# Patient Record
Sex: Male | Born: 1955 | Race: White | Hispanic: No | Marital: Married | Smoking: Current every day smoker
Health system: Southern US, Community
[De-identification: ages and names within clinical notes are randomized; demographics above are authoritative.]

## PROBLEM LIST (undated history)

## (undated) ENCOUNTER — Ambulatory Visit

## (undated) ENCOUNTER — Ambulatory Visit: Attending: Pharmacist | Primary: Pharmacist

## (undated) ENCOUNTER — Encounter

## (undated) ENCOUNTER — Telehealth

## (undated) ENCOUNTER — Ambulatory Visit: Attending: Radiation Oncology | Primary: Radiation Oncology

## (undated) ENCOUNTER — Encounter: Attending: "Endocrinology | Primary: "Endocrinology

## (undated) ENCOUNTER — Telehealth: Attending: Otolaryngology | Primary: Otolaryngology

## (undated) ENCOUNTER — Encounter: Attending: Certified Registered" | Primary: Certified Registered"

## (undated) ENCOUNTER — Encounter: Attending: Family | Primary: Family

## (undated) ENCOUNTER — Encounter: Attending: Radiation Oncology | Primary: Radiation Oncology

## (undated) ENCOUNTER — Encounter: Attending: Family Medicine | Primary: Family Medicine

## (undated) ENCOUNTER — Encounter: Attending: Urology | Primary: Urology

## (undated) ENCOUNTER — Ambulatory Visit
Attending: Student in an Organized Health Care Education/Training Program | Primary: Student in an Organized Health Care Education/Training Program

## (undated) ENCOUNTER — Telehealth: Attending: Family | Primary: Family

## (undated) ENCOUNTER — Encounter
Attending: Student in an Organized Health Care Education/Training Program | Primary: Student in an Organized Health Care Education/Training Program

## (undated) ENCOUNTER — Ambulatory Visit: Payer: PRIVATE HEALTH INSURANCE

## (undated) ENCOUNTER — Telehealth: Attending: Family Medicine | Primary: Family Medicine

## (undated) ENCOUNTER — Ambulatory Visit: Payer: MEDICARE

## (undated) ENCOUNTER — Ambulatory Visit: Payer: PRIVATE HEALTH INSURANCE | Attending: Registered" | Primary: Registered"

## (undated) ENCOUNTER — Encounter: Attending: Gastroenterology | Primary: Gastroenterology

## (undated) ENCOUNTER — Ambulatory Visit: Payer: MEDICARE | Attending: "Endocrinology | Primary: "Endocrinology

## (undated) ENCOUNTER — Telehealth: Attending: Clinical | Primary: Clinical

## (undated) ENCOUNTER — Encounter: Payer: PRIVATE HEALTH INSURANCE | Attending: Family | Primary: Family

## (undated) ENCOUNTER — Ambulatory Visit: Attending: Nurse Practitioner | Primary: Nurse Practitioner

## (undated) ENCOUNTER — Encounter
Payer: PRIVATE HEALTH INSURANCE | Attending: Student in an Organized Health Care Education/Training Program | Primary: Student in an Organized Health Care Education/Training Program

## (undated) ENCOUNTER — Ambulatory Visit: Payer: PRIVATE HEALTH INSURANCE | Attending: Gastroenterology | Primary: Gastroenterology

## (undated) ENCOUNTER — Ambulatory Visit: Attending: "Endocrinology | Primary: "Endocrinology

## (undated) ENCOUNTER — Telehealth: Attending: Urology | Primary: Urology

## (undated) ENCOUNTER — Telehealth: Attending: Radiation Oncology | Primary: Radiation Oncology

## (undated) ENCOUNTER — Ambulatory Visit: Payer: PRIVATE HEALTH INSURANCE | Attending: Family | Primary: Family

## (undated) ENCOUNTER — Telehealth: Attending: Internal Medicine | Primary: Internal Medicine

## (undated) ENCOUNTER — Ambulatory Visit: Payer: MEDICARE | Attending: Family | Primary: Family

## (undated) ENCOUNTER — Encounter: Payer: PRIVATE HEALTH INSURANCE | Attending: Radiation Oncology | Primary: Radiation Oncology

## (undated) ENCOUNTER — Encounter: Attending: Internal Medicine | Primary: Internal Medicine

## (undated) ENCOUNTER — Encounter: Attending: Speech-Language Pathologist | Primary: Speech-Language Pathologist

## (undated) ENCOUNTER — Ambulatory Visit: Payer: PRIVATE HEALTH INSURANCE | Attending: Neurology | Primary: Neurology

## (undated) ENCOUNTER — Encounter: Payer: PRIVATE HEALTH INSURANCE | Attending: "Endocrinology | Primary: "Endocrinology

## (undated) ENCOUNTER — Non-Acute Institutional Stay: Payer: PRIVATE HEALTH INSURANCE

## (undated) ENCOUNTER — Ambulatory Visit: Payer: PRIVATE HEALTH INSURANCE | Attending: Internal Medicine | Primary: Internal Medicine

## (undated) DIAGNOSIS — J449 Chronic obstructive pulmonary disease, unspecified: Secondary | ICD-10-CM

## (undated) DIAGNOSIS — G8929 Other chronic pain: Secondary | ICD-10-CM

## (undated) HISTORY — PX: NECK SURGERY: SHX720

---
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
## 2003-01-28 ENCOUNTER — Inpatient Hospital Stay (HOSPITAL_COMMUNITY): Admission: EM | Admit: 2003-01-28 | Discharge: 2003-02-01 | Payer: Self-pay | Admitting: Psychiatry

## 2003-04-27 ENCOUNTER — Emergency Department (HOSPITAL_COMMUNITY): Admission: EM | Admit: 2003-04-27 | Discharge: 2003-04-28 | Payer: Self-pay | Admitting: Emergency Medicine

## 2003-09-25 ENCOUNTER — Other Ambulatory Visit: Payer: Self-pay

## 2004-11-22 ENCOUNTER — Ambulatory Visit: Payer: Self-pay | Admitting: Family Medicine

## 2005-01-19 ENCOUNTER — Ambulatory Visit: Payer: Self-pay | Admitting: Psychiatry

## 2005-02-13 ENCOUNTER — Inpatient Hospital Stay: Payer: Self-pay | Admitting: Internal Medicine

## 2005-02-13 ENCOUNTER — Other Ambulatory Visit: Payer: Self-pay

## 2005-06-03 ENCOUNTER — Other Ambulatory Visit: Payer: Self-pay

## 2005-06-09 ENCOUNTER — Ambulatory Visit: Payer: Self-pay | Admitting: Unknown Physician Specialty

## 2006-10-05 ENCOUNTER — Other Ambulatory Visit: Payer: Self-pay

## 2006-10-05 ENCOUNTER — Emergency Department: Payer: Self-pay | Admitting: Emergency Medicine

## 2007-04-28 ENCOUNTER — Ambulatory Visit: Payer: Self-pay | Admitting: Internal Medicine

## 2007-07-07 ENCOUNTER — Ambulatory Visit: Payer: Self-pay | Admitting: Emergency Medicine

## 2008-07-08 ENCOUNTER — Ambulatory Visit: Payer: Self-pay | Admitting: Gastroenterology

## 2008-09-02 ENCOUNTER — Emergency Department: Payer: Self-pay | Admitting: Emergency Medicine

## 2008-11-15 ENCOUNTER — Ambulatory Visit: Payer: Self-pay | Admitting: Internal Medicine

## 2009-10-06 ENCOUNTER — Ambulatory Visit: Payer: Self-pay | Admitting: Family Medicine

## 2009-12-22 ENCOUNTER — Ambulatory Visit: Payer: Self-pay | Admitting: Internal Medicine

## 2014-01-15 ENCOUNTER — Ambulatory Visit: Payer: Self-pay | Admitting: Family Medicine

## 2014-02-25 ENCOUNTER — Ambulatory Visit: Payer: Self-pay | Admitting: Family Medicine

## 2015-02-04 ENCOUNTER — Ambulatory Visit
Admission: EM | Admit: 2015-02-04 | Discharge: 2015-02-04 | Disposition: A | Discharge status: Home / Self Care | Payer: BLUE CROSS/BLUE SHIELD | Attending: Internal Medicine | Admitting: Internal Medicine

## 2015-02-04 DIAGNOSIS — B0050 Herpesviral ocular disease, unspecified: Secondary | ICD-10-CM | POA: Diagnosis not present

## 2015-02-04 HISTORY — DX: Other chronic pain: G89.29

## 2015-02-04 HISTORY — DX: Chronic obstructive pulmonary disease, unspecified: J44.9

## 2015-02-04 MED ORDER — GANCICLOVIR 0.15 % OP GEL: 1.0000 [drp] | Freq: Every day | OPHTHALMIC | Status: AC

## 2015-02-04 NOTE — ED Provider Notes (Signed)
CSN: [id number]     Arrival date & time 02/04/15  1631 History   First MD Initiated Contact with Patient 02/04/15 1739     Chief Complaint  Patient presents with  . Eye Pain   (Consider location/radiation/quality/duration/timing/severity/associated sxs/prior Treatment) HPI   This a 59 year old gentleman who presents with the sudden onset of right eye pain stating he felt like something was in his eye when he awoke this morning. He does not remember having any foreign object in his eye but is been bothering him all day long. He does not wear contacts. He has been mildly photosensitive. He states that it hurts worse when he closes his eyes. His eye has been red today.  Past Medical History  Diagnosis Date  . COPD (chronic obstructive pulmonary disease)   . Chronic pain    Past Surgical History  Procedure Laterality Date  . Neck surgery      X 2   No family history on file. History  Substance Use Topics  . Smoking status: Current Every Day Smoker -- 1.00 packs/day for 40 years    Types: Cigarettes  . Smokeless tobacco: Not on file  . Alcohol Use: No    Review of Systems  Eyes: Positive for photophobia, pain and redness.  All other systems reviewed and are negative.   Allergies  Penicillins  Home Medications   Prior to Admission medications   Medication Sig Start Date End Date Taking? Authorizing Provider  albuterol (PROVENTIL HFA;VENTOLIN HFA) 108 (90 BASE) MCG/ACT inhaler Inhale 2 puffs into the lungs every 6 (six) hours as needed for wheezing or shortness of breath.   Yes Historical Provider, MD  buprenorphine-naloxone (SUBOXONE) 2-0.5 MG SUBL SL tablet Place 1 tablet under the tongue daily.   Yes Historical Provider, MD  cholecalciferol (VITAMIN D) 1000 UNITS tablet Take 1,000 Units by mouth daily.   Yes Historical Provider, MD  ergocalciferol (VITAMIN D2) 50000 UNITS capsule Take 50,000 Units by mouth once a week.   Yes Historical Provider, MD  Fluticasone-Salmeterol  (ADVAIR) 250-50 MCG/DOSE AEPB Inhale 1 puff into the lungs 2 (two) times daily.   Yes Historical Provider, MD  Ganciclovir (ZIRGAN) 0.15 % GEL Place 1 drop into the right eye 5 (five) times daily. 02/04/15   Chrissie NoaWilliam P Altus Zaino, PA-C   BP 126/64 mmHg  Pulse 61  Temp(Src) 97.4 F (36.3 C)  Resp 18  Ht 5\' 6"  (1.676 m)  Wt 120 lb (54.432 kg)  BMI 19.38 kg/m2  SpO2 98% Physical Exam  Constitutional: He is oriented to person, place, and time. He appears well-developed and well-nourished.  HENT:  Head: Normocephalic and atraumatic.  Eyes:  Examination of his eyes as visual acuity on the right and 20/25 left 20/15 in both 20/15 with correction using a Snelling chart. Pupil is reactive round. Iris appears normal. The eye is red on the conjunctiva. There is no discharge. The eyelid was everted and no foreign object was found. After permission from the patient the eye was anesthetized using tetracaine and stained with fluorisen. Semination under black clamp shows a 4 mm diffuse patch over the visual surface of the right eye that appears branching. It has a dendritic appearance.  Neurological: He is alert and oriented to person, place, and time. He has normal reflexes.  Skin: Skin is warm and dry.  Psychiatric: He has a normal mood and affect. His behavior is normal. Judgment and thought content normal.    ED Course  Procedures (including critical care time)  Labs Review Labs Reviewed - No data to display  Imaging Review No results found.   MDM   1. Herpes simplex of eye     Discharge Medication List as of 02/04/2015  6:27 PM    START taking these medications   Details  Ganciclovir (ZIRGAN) 0.15 % GEL Place 1 drop into the right eye 5 (five) times daily., Starting 02/04/2015, Until Discontinued, Print       Plan: 1. Diagnosis reviewed with patient 2. rx as per orders; risks, benefits, potential side effects reviewed with patient 3. Recommend supportive treatment with ibuprofen 4. F/u  Dr. Melanie Crazier  Advised the patient of a conversation with Dr. Melanie Crazier the eye surgeon who recommended placing him on Zirgan and have him be seen in the office in a day or 2. The patient will call the office tomorrow to set up an appointment with Dr. Melanie Crazier. He will use ibuprofen for pain and apply cold compresses as necessary to his eye. I will also keep him out of work for 2 days until he seen by Dr. Melanie Crazier.  Lutricia Feil, PA-C 02/04/15 1848

## 2015-02-04 NOTE — ED Notes (Signed)
Patient complains of right eye pain. States that pain started when he woke up this morning. He states that he is having more pain when eye is closed. Eye is red. Patient states that he is not sure what is going on with the eye. Patient describes the pain as sharp and constant.

## 2015-11-10 ENCOUNTER — Ambulatory Visit
Admission: EM | Admit: 2015-11-10 | Discharge: 2015-11-10 | Discharge status: Absent without leave | Payer: BLUE CROSS/BLUE SHIELD

## 2017-07-21 ENCOUNTER — Ambulatory Visit
Admission: RE | Admit: 2017-07-21 | Discharge: 2017-07-21 | Disposition: A | Discharge status: Home / Self Care | Payer: BC Managed Care – PPO

## 2017-07-21 DIAGNOSIS — K7460 Unspecified cirrhosis of liver: Principal | ICD-10-CM

## 2017-07-21 DIAGNOSIS — R918 Other nonspecific abnormal finding of lung field: Secondary | ICD-10-CM

## 2017-07-27 ENCOUNTER — Ambulatory Visit
Admission: RE | Admit: 2017-07-27 | Discharge: 2017-07-27 | Disposition: A | Discharge status: Home / Self Care | Payer: BC Managed Care – PPO | Attending: Family | Admitting: Family

## 2017-07-27 DIAGNOSIS — R634 Abnormal weight loss: Secondary | ICD-10-CM

## 2017-07-27 DIAGNOSIS — K7460 Unspecified cirrhosis of liver: Principal | ICD-10-CM

## 2017-07-27 MED ORDER — LACTULOSE 20 GRAM/30 ML ORAL SOLUTION
Freq: Every day | ORAL | 6 refills | 0.00000 days | Status: CP
Start: 2017-07-27 — End: 2018-03-08

## 2017-08-02 ENCOUNTER — Ambulatory Visit
Admission: RE | Admit: 2017-08-02 | Discharge: 2017-08-02 | Disposition: A | Discharge status: Home / Self Care | Payer: BC Managed Care – PPO

## 2017-08-02 DIAGNOSIS — K7460 Unspecified cirrhosis of liver: Principal | ICD-10-CM

## 2017-08-02 DIAGNOSIS — R634 Abnormal weight loss: Secondary | ICD-10-CM

## 2017-08-18 ENCOUNTER — Encounter
Admit: 2017-08-18 | Discharge: 2017-08-18 | Payer: PRIVATE HEALTH INSURANCE | Attending: Certified Registered" | Primary: Certified Registered"

## 2017-08-18 ENCOUNTER — Encounter: Admit: 2017-08-18 | Discharge: 2017-08-18 | Payer: PRIVATE HEALTH INSURANCE

## 2017-08-18 DIAGNOSIS — K7460 Unspecified cirrhosis of liver: Principal | ICD-10-CM

## 2017-08-21 MED ORDER — ATORVASTATIN 40 MG TABLET
ORAL_TABLET | 1 refills | 0 days | Status: CP
Start: 2017-08-21 — End: 2017-11-20

## 2017-08-30 ENCOUNTER — Encounter
Admit: 2017-08-30 | Discharge: 2017-08-31 | Payer: PRIVATE HEALTH INSURANCE | Attending: "Endocrinology | Primary: "Endocrinology

## 2017-08-30 DIAGNOSIS — M818 Other osteoporosis without current pathological fracture: Principal | ICD-10-CM

## 2017-08-30 DIAGNOSIS — K219 Gastro-esophageal reflux disease without esophagitis: Secondary | ICD-10-CM

## 2017-09-28 ENCOUNTER — Encounter: Admit: 2017-09-28 | Discharge: 2017-09-28 | Payer: PRIVATE HEALTH INSURANCE | Attending: Urology | Primary: Urology

## 2017-09-28 DIAGNOSIS — R3989 Other symptoms and signs involving the genitourinary system: Secondary | ICD-10-CM

## 2017-09-28 DIAGNOSIS — N138 Other obstructive and reflux uropathy: Secondary | ICD-10-CM

## 2017-09-28 DIAGNOSIS — R972 Elevated prostate specific antigen [PSA]: Principal | ICD-10-CM

## 2017-09-28 DIAGNOSIS — R809 Proteinuria, unspecified: Secondary | ICD-10-CM

## 2017-09-28 DIAGNOSIS — N401 Enlarged prostate with lower urinary tract symptoms: Secondary | ICD-10-CM

## 2017-09-28 MED ORDER — TAMSULOSIN 0.4 MG CAPSULE
ORAL_CAPSULE | Freq: Every day | ORAL | 4 refills | 0.00000 days | Status: CP
Start: 2017-09-28 — End: 2018-03-06

## 2017-10-02 ENCOUNTER — Encounter: Admit: 2017-10-02 | Discharge: 2017-10-03 | Payer: PRIVATE HEALTH INSURANCE

## 2017-10-02 DIAGNOSIS — M818 Other osteoporosis without current pathological fracture: Principal | ICD-10-CM

## 2017-10-26 ENCOUNTER — Encounter: Admit: 2017-10-26 | Discharge: 2017-10-27 | Payer: PRIVATE HEALTH INSURANCE

## 2017-10-26 DIAGNOSIS — R972 Elevated prostate specific antigen [PSA]: Principal | ICD-10-CM

## 2017-10-30 ENCOUNTER — Encounter: Admit: 2017-10-30 | Discharge: 2017-10-31 | Payer: PRIVATE HEALTH INSURANCE | Attending: Family | Primary: Family

## 2017-10-30 DIAGNOSIS — K7460 Unspecified cirrhosis of liver: Secondary | ICD-10-CM

## 2017-10-30 DIAGNOSIS — D649 Anemia, unspecified: Secondary | ICD-10-CM

## 2017-10-30 DIAGNOSIS — K769 Liver disease, unspecified: Secondary | ICD-10-CM

## 2017-10-30 DIAGNOSIS — R001 Bradycardia, unspecified: Principal | ICD-10-CM

## 2017-11-09 ENCOUNTER — Ambulatory Visit: Admit: 2017-11-09 | Discharge: 2017-11-10 | Payer: PRIVATE HEALTH INSURANCE

## 2017-11-09 DIAGNOSIS — D649 Anemia, unspecified: Principal | ICD-10-CM

## 2017-11-17 ENCOUNTER — Ambulatory Visit: Admit: 2017-11-17 | Discharge: 2017-11-18 | Payer: PRIVATE HEALTH INSURANCE

## 2017-11-17 DIAGNOSIS — I25119 Atherosclerotic heart disease of native coronary artery with unspecified angina pectoris: Principal | ICD-10-CM

## 2017-11-17 DIAGNOSIS — R001 Bradycardia, unspecified: Secondary | ICD-10-CM

## 2017-11-20 ENCOUNTER — Encounter
Admit: 2017-11-20 | Discharge: 2017-11-20 | Payer: PRIVATE HEALTH INSURANCE | Attending: Family Medicine | Primary: Family Medicine

## 2017-11-20 DIAGNOSIS — J438 Other emphysema: Principal | ICD-10-CM

## 2017-11-20 DIAGNOSIS — D539 Nutritional anemia, unspecified: Secondary | ICD-10-CM

## 2017-11-20 DIAGNOSIS — J449 Chronic obstructive pulmonary disease, unspecified: Secondary | ICD-10-CM

## 2017-11-20 MED ORDER — ALBUTEROL SULFATE HFA 90 MCG/ACTUATION AEROSOL INHALER
RESPIRATORY_TRACT | 3 refills | 0 days | Status: CP | PRN
Start: 2017-11-20 — End: ?

## 2017-11-20 MED ORDER — LISINOPRIL 40 MG TABLET
ORAL_TABLET | Freq: Every day | ORAL | 3 refills | 0 days | Status: CP
Start: 2017-11-20 — End: 2018-01-03

## 2017-11-20 MED ORDER — ATORVASTATIN 40 MG TABLET
ORAL_TABLET | Freq: Every day | ORAL | 1 refills | 0 days | Status: CP
Start: 2017-11-20 — End: 2018-07-23

## 2017-11-20 MED ORDER — FLUTICASONE 250 MCG-SALMETEROL 50 MCG/DOSE BLISTR POWDR FOR INHALATION
Freq: Two times a day (BID) | RESPIRATORY_TRACT | 3 refills | 0 days | Status: CP
Start: 2017-11-20 — End: ?

## 2017-12-13 ENCOUNTER — Ambulatory Visit: Admit: 2017-12-13 | Discharge: 2017-12-13 | Payer: PRIVATE HEALTH INSURANCE

## 2017-12-13 ENCOUNTER — Institutional Professional Consult (permissible substitution): Admit: 2017-12-13 | Discharge: 2017-12-13 | Payer: PRIVATE HEALTH INSURANCE

## 2017-12-13 DIAGNOSIS — I25119 Atherosclerotic heart disease of native coronary artery with unspecified angina pectoris: Principal | ICD-10-CM

## 2017-12-13 DIAGNOSIS — R001 Bradycardia, unspecified: Secondary | ICD-10-CM

## 2017-12-21 ENCOUNTER — Encounter: Admit: 2017-12-21 | Discharge: 2017-12-22 | Payer: PRIVATE HEALTH INSURANCE

## 2017-12-21 DIAGNOSIS — J449 Chronic obstructive pulmonary disease, unspecified: Principal | ICD-10-CM

## 2018-01-03 ENCOUNTER — Encounter
Admit: 2018-01-03 | Discharge: 2018-01-04 | Payer: PRIVATE HEALTH INSURANCE | Attending: Family Medicine | Primary: Family Medicine

## 2018-01-03 DIAGNOSIS — D509 Iron deficiency anemia, unspecified: Principal | ICD-10-CM

## 2018-01-03 DIAGNOSIS — R5382 Chronic fatigue, unspecified: Secondary | ICD-10-CM

## 2018-01-29 ENCOUNTER — Encounter: Admit: 2018-01-29 | Discharge: 2018-01-29 | Payer: PRIVATE HEALTH INSURANCE

## 2018-01-29 ENCOUNTER — Ambulatory Visit: Admit: 2018-01-29 | Discharge: 2018-01-29 | Payer: PRIVATE HEALTH INSURANCE | Attending: Family | Primary: Family

## 2018-01-29 DIAGNOSIS — K7460 Unspecified cirrhosis of liver: Principal | ICD-10-CM

## 2018-01-29 DIAGNOSIS — K769 Liver disease, unspecified: Secondary | ICD-10-CM

## 2018-02-16 ENCOUNTER — Encounter: Admit: 2018-02-16 | Discharge: 2018-02-17 | Payer: PRIVATE HEALTH INSURANCE

## 2018-02-16 DIAGNOSIS — I25119 Atherosclerotic heart disease of native coronary artery with unspecified angina pectoris: Principal | ICD-10-CM

## 2018-02-16 DIAGNOSIS — R0609 Other forms of dyspnea: Secondary | ICD-10-CM

## 2018-03-06 MED ORDER — TAMSULOSIN 0.4 MG CAPSULE
ORAL_CAPSULE | Freq: Every day | ORAL | 2 refills | 0.00000 days | Status: CP
Start: 2018-03-06 — End: 2018-05-18

## 2018-03-08 MED ORDER — LACTULOSE 20 GRAM/30 ML ORAL SOLUTION
Freq: Every day | ORAL | 6 refills | 0 days | Status: CP
Start: 2018-03-08 — End: 2018-11-16

## 2018-05-18 ENCOUNTER — Encounter: Admit: 2018-05-18 | Discharge: 2018-05-18 | Payer: PRIVATE HEALTH INSURANCE | Attending: Urology | Primary: Urology

## 2018-05-18 DIAGNOSIS — R972 Elevated prostate specific antigen [PSA]: Secondary | ICD-10-CM

## 2018-05-18 DIAGNOSIS — N138 Other obstructive and reflux uropathy: Secondary | ICD-10-CM

## 2018-05-18 DIAGNOSIS — N401 Enlarged prostate with lower urinary tract symptoms: Principal | ICD-10-CM

## 2018-05-18 MED ORDER — TAMSULOSIN 0.4 MG CAPSULE
ORAL_CAPSULE | Freq: Every day | ORAL | 3 refills | 0.00000 days | Status: CP
Start: 2018-05-18 — End: 2019-04-08

## 2018-05-30 ENCOUNTER — Encounter: Admit: 2018-05-30 | Discharge: 2018-05-31 | Payer: PRIVATE HEALTH INSURANCE | Attending: Urology | Primary: Urology

## 2018-05-30 DIAGNOSIS — R972 Elevated prostate specific antigen [PSA]: Principal | ICD-10-CM

## 2018-05-30 MED ORDER — LEVOFLOXACIN 500 MG TABLET
ORAL_TABLET | Freq: Once | ORAL | 0 refills | 0 days | Status: CP
Start: 2018-05-30 — End: 2018-05-30

## 2018-06-14 DIAGNOSIS — C61 Malignant neoplasm of prostate: Principal | ICD-10-CM

## 2018-06-15 ENCOUNTER — Encounter: Admit: 2018-06-15 | Discharge: 2018-06-15 | Payer: PRIVATE HEALTH INSURANCE

## 2018-06-19 ENCOUNTER — Encounter: Admit: 2018-06-19 | Discharge: 2018-07-02 | Payer: PRIVATE HEALTH INSURANCE

## 2018-06-19 ENCOUNTER — Encounter: Admit: 2018-06-19 | Discharge: 2018-07-18 | Payer: PRIVATE HEALTH INSURANCE

## 2018-06-19 DIAGNOSIS — C61 Malignant neoplasm of prostate: Principal | ICD-10-CM

## 2018-06-25 ENCOUNTER — Encounter: Admit: 2018-06-25 | Discharge: 2018-06-25 | Payer: PRIVATE HEALTH INSURANCE | Attending: Urology | Primary: Urology

## 2018-06-25 ENCOUNTER — Encounter
Admit: 2018-06-25 | Discharge: 2018-06-25 | Payer: PRIVATE HEALTH INSURANCE | Attending: Radiation Oncology | Primary: Radiation Oncology

## 2018-06-25 DIAGNOSIS — C61 Malignant neoplasm of prostate: Principal | ICD-10-CM

## 2018-07-17 ENCOUNTER — Encounter
Admit: 2018-07-17 | Discharge: 2018-08-07 | Payer: PRIVATE HEALTH INSURANCE | Attending: Radiation Oncology | Primary: Radiation Oncology

## 2018-07-17 DIAGNOSIS — C61 Malignant neoplasm of prostate: Principal | ICD-10-CM

## 2018-07-17 MED ORDER — BICALUTAMIDE 50 MG TABLET
ORAL_TABLET | Freq: Every day | ORAL | 5 refills | 0 days | Status: CP
Start: 2018-07-17 — End: 2018-08-07

## 2018-07-30 ENCOUNTER — Encounter: Admit: 2018-07-30 | Discharge: 2018-07-30 | Payer: PRIVATE HEALTH INSURANCE | Attending: Family | Primary: Family

## 2018-07-30 ENCOUNTER — Ambulatory Visit: Admit: 2018-07-30 | Discharge: 2018-07-30 | Payer: PRIVATE HEALTH INSURANCE

## 2018-07-30 DIAGNOSIS — K7460 Unspecified cirrhosis of liver: Principal | ICD-10-CM

## 2018-08-07 ENCOUNTER — Encounter: Admit: 2018-08-07 | Discharge: 2018-08-08 | Payer: PRIVATE HEALTH INSURANCE | Attending: Family | Primary: Family

## 2018-08-07 DIAGNOSIS — R718 Other abnormality of red blood cells: Secondary | ICD-10-CM

## 2018-08-07 DIAGNOSIS — Z862 Personal history of diseases of the blood and blood-forming organs and certain disorders involving the immune mechanism: Secondary | ICD-10-CM

## 2018-08-07 DIAGNOSIS — Z720 Tobacco use: Secondary | ICD-10-CM

## 2018-08-07 DIAGNOSIS — K7460 Unspecified cirrhosis of liver: Principal | ICD-10-CM

## 2018-08-07 DIAGNOSIS — R634 Abnormal weight loss: Secondary | ICD-10-CM

## 2018-08-07 DIAGNOSIS — R918 Other nonspecific abnormal finding of lung field: Secondary | ICD-10-CM

## 2018-08-07 DIAGNOSIS — K769 Liver disease, unspecified: Secondary | ICD-10-CM

## 2018-08-10 ENCOUNTER — Encounter: Admit: 2018-08-10 | Discharge: 2018-08-11 | Payer: PRIVATE HEALTH INSURANCE

## 2018-08-10 DIAGNOSIS — M818 Other osteoporosis without current pathological fracture: Principal | ICD-10-CM

## 2018-08-13 ENCOUNTER — Ambulatory Visit: Admit: 2018-08-13 | Discharge: 2018-08-14 | Payer: PRIVATE HEALTH INSURANCE

## 2018-08-13 DIAGNOSIS — R918 Other nonspecific abnormal finding of lung field: Principal | ICD-10-CM

## 2018-08-13 DIAGNOSIS — Z129 Encounter for screening for malignant neoplasm, site unspecified: Secondary | ICD-10-CM

## 2018-08-28 MED ORDER — LEVOFLOXACIN 500 MG TABLET
ORAL_TABLET | Freq: Once | ORAL | 0 refills | 0 days | Status: CP
Start: 2018-08-28 — End: 2018-08-28

## 2018-09-04 ENCOUNTER — Ambulatory Visit: Admit: 2018-09-04 | Discharge: 2018-09-05 | Payer: PRIVATE HEALTH INSURANCE

## 2018-09-04 DIAGNOSIS — C61 Malignant neoplasm of prostate: Principal | ICD-10-CM

## 2018-09-10 ENCOUNTER — Encounter: Admit: 2018-09-10 | Discharge: 2018-10-06 | Payer: PRIVATE HEALTH INSURANCE

## 2018-09-10 ENCOUNTER — Encounter
Admit: 2018-09-10 | Discharge: 2018-10-06 | Payer: PRIVATE HEALTH INSURANCE | Attending: Radiation Oncology | Primary: Radiation Oncology

## 2018-09-10 ENCOUNTER — Ambulatory Visit: Admit: 2018-09-10 | Discharge: 2018-10-06 | Payer: PRIVATE HEALTH INSURANCE

## 2018-09-10 DIAGNOSIS — Z5111 Encounter for antineoplastic chemotherapy: Principal | ICD-10-CM

## 2018-09-11 DIAGNOSIS — C61 Malignant neoplasm of prostate: Principal | ICD-10-CM

## 2018-09-12 DIAGNOSIS — Z5111 Encounter for antineoplastic chemotherapy: Principal | ICD-10-CM

## 2018-09-14 ENCOUNTER — Encounter: Admit: 2018-09-14 | Discharge: 2018-09-15 | Payer: PRIVATE HEALTH INSURANCE

## 2018-09-14 DIAGNOSIS — I25119 Atherosclerotic heart disease of native coronary artery with unspecified angina pectoris: Principal | ICD-10-CM

## 2018-09-26 ENCOUNTER — Encounter
Admit: 2018-09-26 | Discharge: 2018-09-27 | Payer: PRIVATE HEALTH INSURANCE | Attending: "Endocrinology | Primary: "Endocrinology

## 2018-09-26 DIAGNOSIS — N200 Calculus of kidney: Secondary | ICD-10-CM

## 2018-09-26 DIAGNOSIS — Z720 Tobacco use: Secondary | ICD-10-CM

## 2018-09-26 DIAGNOSIS — M818 Other osteoporosis without current pathological fracture: Principal | ICD-10-CM

## 2018-10-08 ENCOUNTER — Encounter
Admit: 2018-10-08 | Discharge: 2018-11-06 | Payer: PRIVATE HEALTH INSURANCE | Attending: Radiation Oncology | Primary: Radiation Oncology

## 2018-10-08 ENCOUNTER — Non-Acute Institutional Stay: Admit: 2018-10-08 | Discharge: 2018-11-06 | Payer: PRIVATE HEALTH INSURANCE

## 2018-10-08 ENCOUNTER — Encounter: Admit: 2018-10-08 | Discharge: 2018-11-06 | Payer: PRIVATE HEALTH INSURANCE

## 2018-10-08 ENCOUNTER — Ambulatory Visit
Admit: 2018-10-08 | Discharge: 2018-11-06 | Payer: PRIVATE HEALTH INSURANCE | Attending: Radiation Oncology | Primary: Radiation Oncology

## 2018-10-08 ENCOUNTER — Ambulatory Visit: Admit: 2018-10-08 | Discharge: 2018-11-06 | Payer: PRIVATE HEALTH INSURANCE

## 2018-10-08 DIAGNOSIS — Z5111 Encounter for antineoplastic chemotherapy: Principal | ICD-10-CM

## 2018-10-08 DIAGNOSIS — K7460 Unspecified cirrhosis of liver: Principal | ICD-10-CM

## 2018-10-08 DIAGNOSIS — K769 Liver disease, unspecified: Principal | ICD-10-CM

## 2018-10-08 DIAGNOSIS — C61 Malignant neoplasm of prostate: Principal | ICD-10-CM

## 2018-10-15 DIAGNOSIS — K769 Liver disease, unspecified: Principal | ICD-10-CM

## 2018-10-15 DIAGNOSIS — C61 Malignant neoplasm of prostate: Principal | ICD-10-CM

## 2018-10-15 DIAGNOSIS — K7460 Unspecified cirrhosis of liver: Principal | ICD-10-CM

## 2018-10-15 DIAGNOSIS — Z5111 Encounter for antineoplastic chemotherapy: Principal | ICD-10-CM

## 2018-10-16 DIAGNOSIS — C61 Malignant neoplasm of prostate: Principal | ICD-10-CM

## 2018-10-22 DIAGNOSIS — C61 Malignant neoplasm of prostate: Principal | ICD-10-CM

## 2018-10-22 DIAGNOSIS — Z5111 Encounter for antineoplastic chemotherapy: Principal | ICD-10-CM

## 2018-10-22 DIAGNOSIS — K769 Liver disease, unspecified: Principal | ICD-10-CM

## 2018-10-22 DIAGNOSIS — K7460 Unspecified cirrhosis of liver: Principal | ICD-10-CM

## 2018-10-25 ENCOUNTER — Ambulatory Visit: Admit: 2018-10-25 | Discharge: 2018-10-26 | Payer: PRIVATE HEALTH INSURANCE

## 2018-10-25 DIAGNOSIS — M818 Other osteoporosis without current pathological fracture: Principal | ICD-10-CM

## 2018-10-25 DIAGNOSIS — M810 Age-related osteoporosis without current pathological fracture: Principal | ICD-10-CM

## 2018-10-29 DIAGNOSIS — K7460 Unspecified cirrhosis of liver: Principal | ICD-10-CM

## 2018-10-29 DIAGNOSIS — Z5111 Encounter for antineoplastic chemotherapy: Principal | ICD-10-CM

## 2018-10-29 DIAGNOSIS — K769 Liver disease, unspecified: Principal | ICD-10-CM

## 2018-10-29 DIAGNOSIS — C61 Malignant neoplasm of prostate: Principal | ICD-10-CM

## 2018-10-30 ENCOUNTER — Encounter: Admit: 2018-10-30 | Discharge: 2018-10-31 | Payer: PRIVATE HEALTH INSURANCE

## 2018-10-30 DIAGNOSIS — R51 Headache: Principal | ICD-10-CM

## 2018-10-30 DIAGNOSIS — Z01818 Encounter for other preprocedural examination: Principal | ICD-10-CM

## 2018-10-30 DIAGNOSIS — M810 Age-related osteoporosis without current pathological fracture: Principal | ICD-10-CM

## 2018-10-30 DIAGNOSIS — K219 Gastro-esophageal reflux disease without esophagitis: Principal | ICD-10-CM

## 2018-10-30 DIAGNOSIS — J438 Other emphysema: Principal | ICD-10-CM

## 2018-10-30 DIAGNOSIS — C4490 Unspecified malignant neoplasm of skin, unspecified: Principal | ICD-10-CM

## 2018-10-30 DIAGNOSIS — F1911 Other psychoactive substance abuse, in remission: Principal | ICD-10-CM

## 2018-10-30 DIAGNOSIS — Z720 Tobacco use: Principal | ICD-10-CM

## 2018-10-30 DIAGNOSIS — N200 Calculus of kidney: Principal | ICD-10-CM

## 2018-10-30 DIAGNOSIS — C61 Malignant neoplasm of prostate: Principal | ICD-10-CM

## 2018-10-30 DIAGNOSIS — E785 Hyperlipidemia, unspecified: Principal | ICD-10-CM

## 2018-10-30 DIAGNOSIS — B159 Hepatitis A without hepatic coma: Principal | ICD-10-CM

## 2018-10-30 DIAGNOSIS — N400 Enlarged prostate without lower urinary tract symptoms: Principal | ICD-10-CM

## 2018-10-30 DIAGNOSIS — R911 Solitary pulmonary nodule: Principal | ICD-10-CM

## 2018-10-30 DIAGNOSIS — J45909 Unspecified asthma, uncomplicated: Principal | ICD-10-CM

## 2018-10-30 DIAGNOSIS — K635 Polyp of colon: Principal | ICD-10-CM

## 2018-10-30 DIAGNOSIS — G8929 Other chronic pain: Principal | ICD-10-CM

## 2018-10-30 DIAGNOSIS — B1920 Unspecified viral hepatitis C without hepatic coma: Principal | ICD-10-CM

## 2018-11-05 DIAGNOSIS — K7460 Unspecified cirrhosis of liver: Principal | ICD-10-CM

## 2018-11-05 DIAGNOSIS — Z5111 Encounter for antineoplastic chemotherapy: Principal | ICD-10-CM

## 2018-11-05 DIAGNOSIS — C61 Malignant neoplasm of prostate: Principal | ICD-10-CM

## 2018-11-05 DIAGNOSIS — K769 Liver disease, unspecified: Principal | ICD-10-CM

## 2018-11-05 MED ORDER — ATORVASTATIN 40 MG TABLET
ORAL_TABLET | 0 refills | 0 days | Status: CP
Start: 2018-11-05 — End: 2019-02-04

## 2018-11-16 MED ORDER — LACTULOSE 20 GRAM/30 ML ORAL SOLUTION
Freq: Every day | ORAL | 6 refills | 0 days | Status: CP
Start: 2018-11-16 — End: 2019-03-05

## 2018-12-04 ENCOUNTER — Encounter: Admit: 2018-12-04 | Discharge: 2019-01-02 | Payer: PRIVATE HEALTH INSURANCE

## 2018-12-04 ENCOUNTER — Ambulatory Visit: Admit: 2018-12-04 | Discharge: 2019-01-02 | Payer: PRIVATE HEALTH INSURANCE

## 2018-12-04 ENCOUNTER — Encounter: Admit: 2018-12-04 | Discharge: 2018-12-17 | Payer: PRIVATE HEALTH INSURANCE

## 2018-12-04 DIAGNOSIS — I25119 Atherosclerotic heart disease of native coronary artery with unspecified angina pectoris: Principal | ICD-10-CM

## 2018-12-11 ENCOUNTER — Encounter
Admit: 2018-12-11 | Discharge: 2019-01-06 | Payer: PRIVATE HEALTH INSURANCE | Attending: Radiation Oncology | Primary: Radiation Oncology

## 2018-12-11 ENCOUNTER — Encounter: Admit: 2018-12-11 | Discharge: 2019-01-06 | Payer: PRIVATE HEALTH INSURANCE

## 2018-12-11 DIAGNOSIS — C61 Malignant neoplasm of prostate: Principal | ICD-10-CM

## 2018-12-11 DIAGNOSIS — Z5111 Encounter for antineoplastic chemotherapy: Principal | ICD-10-CM

## 2019-01-16 ENCOUNTER — Encounter
Admit: 2019-01-16 | Discharge: 2019-02-05 | Payer: PRIVATE HEALTH INSURANCE | Attending: Radiation Oncology | Primary: Radiation Oncology

## 2019-01-16 DIAGNOSIS — C61 Malignant neoplasm of prostate: Principal | ICD-10-CM

## 2019-02-04 MED ORDER — ATORVASTATIN 40 MG TABLET
ORAL_TABLET | 0 refills | 0 days | Status: CP
Start: 2019-02-04 — End: ?

## 2019-02-25 ENCOUNTER — Ambulatory Visit: Admit: 2019-02-25 | Discharge: 2019-02-25 | Payer: PRIVATE HEALTH INSURANCE

## 2019-02-25 ENCOUNTER — Encounter: Admit: 2019-02-25 | Discharge: 2019-02-25 | Payer: PRIVATE HEALTH INSURANCE | Attending: Family | Primary: Family

## 2019-02-25 DIAGNOSIS — R1312 Dysphagia, oropharyngeal phase: Secondary | ICD-10-CM

## 2019-02-25 DIAGNOSIS — K769 Liver disease, unspecified: Principal | ICD-10-CM

## 2019-02-25 DIAGNOSIS — Z7689 Persons encountering health services in other specified circumstances: Secondary | ICD-10-CM

## 2019-02-25 DIAGNOSIS — K7460 Unspecified cirrhosis of liver: Secondary | ICD-10-CM

## 2019-02-25 DIAGNOSIS — R5382 Chronic fatigue, unspecified: Principal | ICD-10-CM

## 2019-02-25 DIAGNOSIS — Z1321 Encounter for screening for nutritional disorder: Secondary | ICD-10-CM

## 2019-02-25 DIAGNOSIS — Z720 Tobacco use: Secondary | ICD-10-CM

## 2019-02-25 DIAGNOSIS — G9340 Encephalopathy, unspecified: Secondary | ICD-10-CM

## 2019-02-25 DIAGNOSIS — J392 Other diseases of pharynx: Secondary | ICD-10-CM

## 2019-02-25 MED ORDER — RIFAXIMIN 550 MG TABLET
ORAL_TABLET | Freq: Two times a day (BID) | ORAL | 6 refills | 30.00000 days | Status: CP
Start: 2019-02-25 — End: ?
  Filled 2019-02-27: qty 60, 30d supply, fill #0

## 2019-02-25 NOTE — Unmapped (Signed)
[facility] Ph [phone] Fax [phone]    Woodfin Ganja, M.D.  Assistant Professor of Medicine  [facility]  [place] [facility] [place] [facility] [place]    [phone]    PCP:  Roxan Diesel, MD     Reason for Office Follow-up:  Follow-up for cirrhosis care. + decompensated cirrhosis based on +HE. He has demonstrated low MELD score. Patient s/p treatment and cure of HCV 09/24/14.  Newly diagnosed prostate cancer. Finished radiation txment March 2020 and continues with Lupron.     Presentation of Current Illness:    [name] is a 63 y.o. pleasant Caucasian gentleman who presents today for continued cirrhosis. 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 He successfully completed Harvoni x 12 weeks of treatment on 09/24/2014.  HCV RNA level at SVR was not-detected indicating evidence of him being cured. He was treatment naive with genotype 1a. History of recreational drug use (cocaine IV, intranasal and crack), three tattoos with one being homemade, and incarcerated once. Fibroscan 20 kPa consistent with F4 (cirrhosis).     Interim History:  He presents with this wife today who has been present during all his office visits. 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 He was started on trial of Lactulose 30 ml daily with noted improvement. Today he mentions if he does not take his dose of lactulose he will experience confusion. He will not take his dose of lactulose if he is on call due to the effects of the medication.  Previously he had experienced two occurrences of becoming very confused while driving and shakiness which has not seemed to occur recently (tremor like). Denies altered sleep pattern.    Noted two pound weight gain since last clinic visit on 07/2018. He has two more Lupron injections until treatment is completed. He completed radiation txment for prostate cancer in March. He was under the care of Dr. Assunta Gambles. Currently under the care of Dr. Nicanor Alcon, urology oncology. Gleason 4+4, clinical stage [id number] prostate cancer diagnosed in 2/12 of his biopsy cores. His CAPRA score is 4, consistent with intermediate-risk disease.+ history of macular degeneration and on AREDS. + history of skin cancer.     For the past six months he has been experiencing dysphagia involving both solid foods as well as liquids. Feeling of food bolus getting hung to oropharyngeal region. The occurs of choking has caused him to regurgitate twice. This can occur with solid foods as well as liquids. Denies pain with swallowing. Long standing history of tobacco use.     He had a colonoscopy 10/20/15 showing 1 small adenoma- 5 yr follow-up. EGD 08/2017 essentially unremarkable. Denies any jaundice, pruritis, SOB, CP, increased abdominal girth, LE edema, melena, BRBPR, confusion, nausea, vomiting, or diarrhea. Mild depression with recent medical history changes.  Finding of two 3 mm pulmonary nodules on CT scan lung for lung cancer screening (07/11/2016) in the setting of history of tobacco abuse. CT scan of chest performed 07/21/2017 stable unchanged subcentimeter pulmonary nodules. Annual surveillance recommended. CT scan of chest 08/13/2018 - No lung nodules. Surveillance one year. Complaint of chronic fatigue.     Echocardiogram 11/17/2017: Performed for indication of bradycardia.     Interpretation Summary     ?? Normal left ventricular systolic function, ejection fraction 60 to 65%  ?? Dilated left atrium - mild  ?? Normal right ventricular systolic function  ?? Tricuspid regurgitation - mild  ??  Mildly elevated right atrial pressure     External ECG-48 to 21 day findings:  Conclusion: ??1 run of non-sustained Ventricular Tachycardia occurred lasting 6 beats with a max rate of 112 bpm (avg 93 bpm). ??37 Supraventricular Tachycardia runs occurred, most consistent with paroxysmal atrial tachycardia; the run with the fastest interval lasting 13 beats with a max rate of 207 bpm, the longest lasting 8 beats with an avg rate of 104 bpm.    NM Myocardial perfusion spect multiple: 11/2018:   Normal myocardial perfusion study. No evidence for significant ischemia or scar is noted. Post stress: Global systolic function is hyperdynamic. The ejection fraction was greater than 65%. Coronary calcifications are noted. Mild bibasilar dependent atelectasis    Cirrhosis Care:  -- Varices screen: normal platelets and spleen size with Fibroscan < 25 kPa. No history of EGD.  -- HCC screen: MRI of abdomen 02/25/2019:   Stable LR-2 lesions, as above. No new hepatic lesions are identified. Spleen unremarkable.     -- Immunity hepatitis B.   -- Immunity hepatitis A: first vaccination given today.   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     -- Fibroscan 06/16/2014: 20.0 kPa/F4  --Chronic hepatitis C, genotype 1a  Pre-treatment HCV RNA:  14474 IU/mL  HCV RNA 08/23/2014 - TW #6/12 - non-detectable  HCV RNA 09/24/2014 - EOT- non-detectable  HCV RNA SVR 01/01/2015 - -not detected  HCV RNA 01/26/2017 - not detected    ROS:  All ten systems reviewed and negative except in HPI.     Allergies:   Allergies   Allergen Reactions   ??? Bicalutamide Rash   ??? Penicillins Rash     Medications:  Current Outpatient Medications   Medication Sig Dispense Refill   ??? albuterol HFA 90 mcg/actuation inhaler Inhale 2 puffs every four (4) hours as needed for wheezing. 3 Inhaler 3   ??? aspirin (ECOTRIN) 81 MG tablet Take 81 mg by mouth daily.     ??? atorvastatin (LIPITOR) 40 MG tablet TAKE 1/2 TABLET(20 MG) BY MOUTH EVERY NIGHT FOR CHOLESTEROL 45 tablet 0   ??? cholecalciferol, vitamin D3, (VITAMIN D3) 1,000 unit capsule Take 1,000 Units by mouth daily.     ??? cyanocobalamin (VITAMIN B-12) 250 MCG tablet Take 250 mcg by mouth daily.     ??? ferrous sulfate 325 mg (65 mg iron) CpER Take 1 tablet by mouth daily.      ??? fluticasone propion-salmeterol (ADVAIR DISKUS) 250-50 mcg/dose diskus Inhale 1 puff Two (2) times a day. 3 each 3   ??? lactulose (CHRONULAC) 20 gram/30 mL Soln Take 30 mL (20 g total) by mouth daily. 900 mL 6   ??? loratadine (CLARITIN) 10 mg tablet Take 10 mg by mouth daily.     ??? multivitamin (TAB-A-VITE/THERAGRAN) per tablet Take 1 tablet by mouth daily.     ??? SUBOXONE 8-2 mg sublingual film DIS 1 FILM UNT  BID  0   ??? tamsulosin (FLOMAX) 0.4 mg capsule Take 1 capsule (0.4 mg total) by mouth daily. 90 capsule 3   ??? vit C,E-Zn-coppr-lutein-zeaxan (PRESERVISION AREDS 2) 250-200-40-1 mg-unit-mg-mg cap Take 1 tablet by mouth Two (2) times a day.     ??? vitamin A 84132 UNIT capsule Take 10,000 Units by mouth daily.     ??? rifAXIMin (XIFAXAN) 550 mg Tab Take 1 tablet (550 mg total) by mouth Two (2) times a day. 60 tablet 6     No current facility-administered medications for this visit.      Active Ambulatory Problems     Diagnosis Date Noted   ??? Chronic headaches    ??? Renal calculus    ??? Chest pain 02/07/2013   ??? Tobacco abuse 02/08/2013   ??? GERD (gastroesophageal reflux disease) 02/08/2013   ??? Skin lesion 02/12/2013   ??? Nasal septum perforation 02/12/2013   ??? Insomnia 02/13/2013   ??? Other emphysema (CMS-HCC)    ??? Hepatitis C 03/05/2014   ??? Sialolithiasis of submandibular gland 04/08/2015   ??? Rib pain on left side 06/16/2016   ??? Health care maintenance 06/16/2016   ??? Nonspecific L Lung micronodules, repeat LDCT due 07/12/2017 07/12/2016   ??? Atherosclerosis of coronary artery 08/16/2016   ??? 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 Actinic keratosis 02/01/2017   ??? Elevated PSA 09/28/2017   ??? Benign localized hyperplasia of prostate with urinary obstruction 09/28/2017   ??? Proteinuria 09/28/2017   ??? Abnormal prostate exam 09/28/2017   ??? Prostate cancer (CMS-HCC) 09/11/2018     Resolved Ambulatory Problems     Diagnosis Date Noted   ??? No Resolved Ambulatory Problems     Past Medical History:   Diagnosis Date   ??? Asthma    ??? Colon polyp    ??? Enlarged prostate    ??? Hx of substance abuse (CMS-HCC)    ??? Infectious viral hepatitis    ??? Osteoporosis    ??? Skin cancer      Past Surgical History:   Procedure Laterality Date   ??? CERVICAL FUSION      cage   ??? PR COLSC FLX W/RMVL OF TUMOR POLYP LESION SNARE TQ N/A 10/20/2015    Procedure: COLONOSCOPY FLEX; W/REMOV TUMOR/LES BY SNARE;  Surgeon: Annie Paras, MD;  [facility];  Service: Gastroenterology   ??? PR EXCISION SUBMAXILLARY GLAND Right 03/31/2015    Procedure: EXC SUBMANDIBULAR GLAND;  Surgeon: Lauralee Evener, MD;  Location: [facility];  Service: ENT   ??? PR UPPER GI ENDOSCOPY,DIAGNOSIS N/A 08/18/2017    Procedure: UGI ENDO, INCLUDE ESOPHAGUS, STOMACH, & DUODENUM &/OR JEJUNUM; DX W/WO COLLECTION SPECIMN, BY BRUSH OR WASH;  Surgeon: Rona Ravens, MD;  [facility];  Service: Gastroenterology     Family History   Problem Relation Age of Onset   ??? Heart attack Father    ??? Hypertension Father    ??? Lung cancer Mother    ??? Bone cancer Mother    ??? Hypertension Mother    ??? Hepatitis Sister       Social History     Socioeconomic History   ??? Marital status: Married     Spouse name: Not on file   ??? Number of children: Not on file   ??? Years of education: Not on file   ??? Highest education level: Not on file   Occupational History   ??? Not on  file   Social Needs   ??? Financial resource strain: Not on file   ??? Food insecurity     Worry: Not on file     Inability: Not on file   ??? Transportation needs     Medical: Not on file     Non-medical: Not on file Tobacco Use   ??? Smoking status: Heavy Tobacco Smoker     Packs/day: 1.00     Types: Cigarettes     Start date: 06/16/1969   ??? Smokeless tobacco: Former Neurosurgeon     Types: Chew     Quit date: 2009   ??? Tobacco comment: REfused tobacco cessation referral   Substance and Sexual Activity   ??? Alcohol use: No     Alcohol/week: 0.0 standard drinks   ??? Drug use: Not Currently     Types: Cocaine, IV     Comment: History of cocaine use years ago (intranasal, IV and crack), marijuana use.    ??? Sexual activity: Yes     Partners: Female   Lifestyle   ??? Physical activity     Days per week: Not on file     Minutes per session: Not on file   ??? Stress: Not on file   Relationships   ??? Social Wellsite geologist on phone: Not on file     Gets together: Not on file     Attends religious service: Not on file     Active member of club or organization: Not on file     Attends meetings of clubs or organizations: Not on file     Relationship status: Not on file   Other Topics Concern   ??? Not on file   Social History Narrative   ??? Not on file     Physical Examination:   BP 116/53  - Pulse 58  - Temp 36.3 ??C (97.3 ??F) (Temporal)  - Wt 54.7 kg (120 lb 8 oz)  - SpO2 97% Comment: room air - BMI 19.45 kg/m??   General appearance - Alert, no distress, oriented to person, place, and time. + temporal wasting. Very slender.   Mental status - Normal mood, behavior, speech, dress, motor activity, and thought processes. Very pleasant.   Eyes - pupils equal and reactive, extraocular eye movements intact, sclera anicteric  Neck - supple, no significant adenopathy  Mouth: Two patches of erythema with white, darkish cream color exudate noted bilateral side. Mild tenderness with touch. No bleeding.   Chest - clear to auscultation, no wheezes, rales or rhonchi, symmetric air entry  Heart - normal rate, regular rhythm, normal S1, S2, no murmurs, rubs, clicks or gallops  GI: Abdomen soft, nontender, nondistended, no masses or organomegaly  Neurological - screening mental status exam normal  Musculoskeletal - no joint tenderness, deformity or swelling  Extremities - peripheral pulses normal, no pedal edema, no clubbing or cyanosis  Skin - normal coloration and turgor, no suspicious skin lesions noted. No evidence of palmar erythema. + spider angiomas. No drainage or vesicular appearance. Non pruritic appearance.     Laboratory Studies:  Results for orders placed or performed during the hospital encounter of 02/25/19   Hepatic Function Panel   Result Value Ref Range    Albumin 3.9 3.5 - 5.0 g/dL    Total Protein 7.2 6.5 - 8.3 g/dL    Total Bilirubin <1.6 0.0 - 1.2 mg/dL    Bilirubin, Direct <1.09 0.00 - 0.40 mg/dL    AST 36  19 - 55 U/L    ALT 25 <50 U/L    Alkaline Phosphatase 44 38 - 126 U/L   Basic metabolic panel   Result Value Ref Range    Sodium 144 135 - 145 mmol/L    Potassium 4.3 3.5 - 5.0 mmol/L    Chloride 109 (H) 98 - 107 mmol/L    CO2 28.0 22.0 - 30.0 mmol/L    Anion Gap 7 7 - 15 mmol/L    BUN 16 7 - 21 mg/dL    Creatinine 1.61 0.96 - 1.30 mg/dL    BUN/Creatinine Ratio 20     EGFR CKD-EPI Non-African American, Male >90 >=60 mL/min/1.55m2    EGFR CKD-EPI African American, Male >90 >=60 mL/min/1.80m2    Glucose 85 70 - 179 mg/dL    Calcium 9.1 8.5 - 04.5 mg/dL   POCT Creatinine   Result Value Ref Range    Creatinine Whole Blood, POC 0.9 0.8 - 1.4 mg/dL    EGFR CKD-EPI African American Male >90 >=60 mL/min/1.110m2    EGFR CKD-EPI Non-African American Male >90 >=60 mL/min/1.53m2   CBC w/ Differential   Result Value Ref Range    WBC 5.2 4.5 - 11.0 10*9/L    RBC 3.53 (L) 4.50 - 5.90 10*12/L    HGB 12.5 (L) 13.5 - 17.5 g/dL    HCT 40.9 (L) 81.1 - 53.0 %    MCV 109.3 (H) 80.0 - 100.0 fL    MCH 35.4 (H) 26.0 - 34.0 pg    MCHC 32.3 31.0 - 37.0 g/dL    RDW 91.4 78.2 - 95.6 %    MPV 7.3 7.0 - 10.0 fL    Platelet 201 150 - 440 10*9/L    Neutrophils % 66.4 %    Lymphocytes % 21.8 %    Monocytes % 6.4 %    Eosinophils % 2.3 %    Basophils % 0.8 %    Absolute Neutrophils 3.5 2.0 - 7.5 10*9/L    Absolute Lymphocytes 1.1 (L) 1.5 - 5.0 10*9/L    Absolute Monocytes 0.3 0.2 - 0.8 10*9/L    Absolute Eosinophils 0.1 0.0 - 0.4 10*9/L    Absolute Basophils 0.0 0.0 - 0.1 10*9/L    Large Unstained Cells 2 0 - 4 %    Macrocytosis Marked (A) Not Present   Morphology Review   Result Value Ref Range    Smear Review Comments       Assessment/Plan:   [name] is a 64 y.o. Caucasian male with well compensated cirrhosis secondary to chronic HCV genotype 1a, treatment naive ~ cured. He presents today for continued cirrhosis care. + HE history which is fairly well controlled but still room for improvement. Very sensitive if he misses a dose. He has remained on Lactulose 30 ml daily. If he should skip a day or two of his medication, he will notice mental status changes again. + confusion involving his surroundings. Lactulose has helped with controlling chronic constipation. No history of an overt decompensated event such as upper GI bleed or ascites. His fibroscan is consistent with F4 disease. Continued evidence of good synthetic liver function.     ~ MELD labs ordered.   ~ Office follow up six months   ~ Continue lactulose but will change to 15 ml bid and add Xifaxan 550 mg bid. RX for Xifaxan sent to [facility] to approval. Instructed to take both medications today. Able to titrate lactulose dosing up if necessary. To take at least 15  ml daily on days in which he is on call and see if mental status is improvement with at least lower dose. Discussed with patient the consideration of short term disability for a month or so to see if that would help given recent prostate cancer treatment too.   ~ No alcohol use.   ~ Office follow up three months     HE: Lactulose 15 ml bid. Add Xifaxan 550 mg bid.     HCC screening ~Up to date. MRI of abdomen ordered for six months. + history of LRAD-2 lesions.     Osteoporosis evaluation ~Under the care of endocrinology at [facility].     Tobacco abuse, continued ~ Smoking cessation discussed again.    Pulmonary nodules ~  CT scan of chest without contrast warranted annually.     Skin cancer ~ Under the care of dermatology.     Nutritional status ~ Continue with high caloric diet with continuation of at least two cans of Boost daily. Noted two pound weight loss since last clinic visit.     Variceal Screening~ Up to date. No history of varices. Surveillance 2022.     History of Bradycardia with evidence of VTach as noted above ~Under the care of cardiology.     History of prostate cancer ~ Under the care of oncology. He has successfully completed radiation treatment. Two more doses of Lupron.     Dysphagia: EGD was performed 08/2017 and essentially unremarkable. Considered proceeding forward with modified barium swallowing but given oral cavity findings in the setting of tobacco use. Wish for ENT evaluation to be concluded first. History of salivary stones. Order placed for ENT consultation.     Establish care: Referral order placed for internal medicine at [facility].     Vitamin D screening: Vitamin D level ordered.     Fatigue: Vitamin B12 level and TSH ordered.     All patient's questions as well as his wife's were answered to their satisfaction during office visit today.      Rodman Key, DNP, FNP-BC  [facility]  [address]  [place] [address]  Phone [phone]

## 2019-02-26 NOTE — Unmapped (Signed)
Per test claim for Xifaxan at the [facility], patient needs Medication Assistance Program for High Copay.

## 2019-02-27 LAB — VITAMIN D, TOTAL (25OH): Lab: 46.3

## 2019-02-27 MED FILL — XIFAXAN 550 MG TABLET: 30 days supply | Qty: 60 | Fill #0 | Status: AC

## 2019-02-27 NOTE — Unmapped (Signed)
[facility]   Patient Onboarding/Medication Counseling    Mr.Yusko is a 63 y.o. male with cirrhosis who I am counseling today on initiation of therapy.  I am speaking to the patient's wife [name].    Verified patient's date of birth / HIPAA.    Specialty medication(s) to be sent: Infectious Disease: Xifaxan      Non-specialty medications/supplies to be sent: n/a      Medications not needed at this time: n/a         Xifaxan (rifaximin) 550mg  tablets    Medication & Administration     Dosage: Take one 550mg  tablet by mouth twice daily.      Administration: Take with or without food.    Adherence/Missed dose instructions:   ? Take missed dose as soon as you remember. If it is close to the time of your next dose, skip the missed dose and resume with your next scheduled dose.  ? Do not take extra doses or 2 doses at the same time.    Goals of Therapy      The goal is to reduce risk of overt hepatic encephalopathy recurrence.      Side Effects & Monitoring Parameters     Common Side Effects:   ? Peripheral edema  ? Nausea  ? Dizziness  ? Fatigue  ? Ascites    The following side effects should be reported to the provider:  ?? Signs of an allergic reaction, such as rash; hives; itching; red, swollen, blistered, or peeling skin with or without fever. If you have wheezing; tightness in the chest or throat; trouble breathing, swallowing, or talking; unusual hoarseness; or swelling of the mouth, face, lips, tongue, or throat, call 911 or go to the closest emergency department (ED).   ?? Swelling in the arms, legs or stomach.   ?? Feeling very tired or weak.  ?? Low mood (depression).   ?? Fever.   ?? Diarrhea is common with antibiotics. Rarely, a severe form called C diff???associated diarrhea (CDAD) may happen. Sometimes this has led to a deadly bowel problem (colitis). CDAD may happen during or a few months after taking antibiotics. Call your doctor right away if you have stomach pain, cramps, or very loose, watery, or bloody stools. Check with your doctor before treating diarrhea.    Monitoring Parameters:  Patient should monitor for changes in mental status.       Contraindications, Warnings, & Precautions     ? Superinfection: Prolonged use may result in fungal or bacterial superinfection, including Clostridioides (formerly Clostridium) difficile-associated diarrhea (CDAD) and pseudomembranous colitis; CDAD has been observed >2 months post-antibiotic treatment.  ? Severe (Child Pugh Class C) Hepatic Impairment: increased systemic exposure with severe hepatic impairment.  ? Concomitant use with P-glycoprotein (P-gp) inhibitors: P-gp inhibitors may increase systemic exposure of rifaximin.    Drug/Food Interactions     ? Medication list reviewed in Epic. The patient was instructed to inform the care team before taking any new medications or supplements. No drug interactions identified.   ? Warfarin: monitor INR and prothrombin time; Dose adjustment of warfarin may be needed to maintain target INR range.    Storage, Handling Precautions, & Disposal     ? Store this medication at room temperature.  ? Store in a dry place. Do not store in a bathroom.   ? Keep all drugs out of the reach of children and pets.  ? Throw away unused or expired drugs. Do not flush  down a toilet or pour down a drain unless you are told to do so. Check with your pharmacist if you have questions about the best way to throw out drugs. There may be drug take-back programs in your area.        Current Medications (including OTC/herbals), Comorbidities and Allergies     Current Outpatient Medications   Medication Sig Dispense Refill   ??? albuterol HFA 90 mcg/actuation inhaler Inhale 2 puffs every four (4) hours as needed for wheezing. 3 Inhaler 3   ??? aspirin (ECOTRIN) 81 MG tablet Take 81 mg by mouth daily.     ??? atorvastatin (LIPITOR) 40 MG tablet TAKE 1/2 TABLET(20 MG) BY MOUTH EVERY NIGHT FOR CHOLESTEROL 45 tablet 0   ??? cholecalciferol, vitamin D3, (VITAMIN D3) 1,000 unit capsule Take 1,000 Units by mouth daily.     ??? cyanocobalamin (VITAMIN B-12) 250 MCG tablet Take 250 mcg by mouth daily.     ??? ferrous sulfate 325 mg (65 mg iron) CpER Take 1 tablet by mouth daily.      ??? fluticasone propion-salmeterol (ADVAIR DISKUS) 250-50 mcg/dose diskus Inhale 1 puff Two (2) times a day. 3 each 3   ??? lactulose (CHRONULAC) 20 gram/30 mL Soln Take 30 mL (20 g total) by mouth daily. 900 mL 6   ??? loratadine (CLARITIN) 10 mg tablet Take 10 mg by mouth daily.     ??? multivitamin (TAB-A-VITE/THERAGRAN) per tablet Take 1 tablet by mouth daily.     ??? rifAXIMin (XIFAXAN) 550 mg Tab Take 1 tablet (550 mg total) by mouth Two (2) times a day. 60 tablet 6   ??? SUBOXONE 8-2 mg sublingual film DIS 1 FILM UNT BID  0   ??? tamsulosin (FLOMAX) 0.4 mg capsule Take 1 capsule (0.4 mg total) by mouth daily. 90 capsule 3   ??? vit C,E-Zn-coppr-lutein-zeaxan (PRESERVISION AREDS 2) 250-200-40-1 mg-unit-mg-mg cap Take 1 tablet by mouth Two (2) times a day.     ??? vitamin A 16109 UNIT capsule Take 10,000 Units by mouth daily.       No current facility-administered medications for this visit.        Allergies   Allergen Reactions   ??? Bicalutamide Rash   ??? Penicillins Rash       Patient Active Problem List   Diagnosis   ??? Chronic headaches   ??? Renal calculus   ??? Chest pain   ??? Tobacco abuse   ??? GERD (gastroesophageal reflux disease)   ??? Skin lesion   ??? Nasal septum perforation   ??? Insomnia   ??? Other emphysema (CMS-HCC)   ??? Hepatitis C   ??? Sialolithiasis of submandibular gland   ??? Rib pain on left side   ??? Health care maintenance   ??? Nonspecific L Lung micronodules, repeat LDCT due 07/12/2017   ??? Atherosclerosis of coronary artery   ??? Osteoporosis of lumbar spine   ??? Actinic keratosis   ??? Elevated PSA   ??? Benign localized hyperplasia of prostate with urinary obstruction   ??? Proteinuria   ??? Abnormal prostate exam   ??? Prostate cancer (CMS-HCC)       Reviewed and up to date in Epic.    Appropriateness of Therapy Is medication and dose appropriate based on diagnosis? Yes    Baseline Quality of Life Assessment      How many days over the past month did your cirrhosis keep you from your normal activities? 0    Financial Information  Medication Assistance provided: Copay Assistance    Anticipated copay of $0.00 reviewed with patient. Verified delivery address.    Delivery Information     Scheduled delivery date: 02/28/2019    Expected start date: 02/28/2019    Medication will be delivered via Next Day Courier to the home address in [place].  This shipment will not require a signature.      Explained the services we provide at [facility] and that each month we would call to set up refills.  Stressed importance of returning phone calls so that we could ensure they receive their medications in time each month.  Informed patient that we should be setting up refills 7-10 days prior to when they will run out of medication.  A pharmacist will reach out to perform a clinical assessment periodically.  Informed patient that a welcome packet and a drug information handout will be sent.      Patient verbalized understanding of the above information as well as how to contact the pharmacy at [phone] option 4 with any questions/concerns.  The pharmacy is open Monday through Friday 8:30am-4:30pm.  A pharmacist is available 24/7 via pager to answer any clinical questions they may have.    Patient Specific Needs     ? Does the patient have any physical, cognitive, or cultural barriers? No    ? Patient prefers to have medications discussed with  patient's wife [name]     ? Is the patient able to read and understand education materials at a high school level or above? Yes    ? Patient's primary language is  English     ? Is the patient high risk? No     ? Does the patient require a Care Management Plan? No     ? Does the patient require physician intervention or other additional services (i.e. nutrition, smoking cessation, social work)? No      Roderic Palau  [facility] Specialty Pharmacist

## 2019-02-27 NOTE — Unmapped (Signed)
[facility] Specialty Medication Referral: Financial Assistance Approved      Medication (Brand/Generic): [employer]    Final Test Claim completed with resulted information below:    Patient ABLE to fill at [facility]  Insurance Company:  [employer]  Anticipated Copay: $0  Is anticipated copay with a copay card or grant? Yes, there was a copay card approved for this patient.     Does this patient have to receive a partial fill of the medication due to insurance restrictions? NO  If so, please cofirm how many days supply is allowed per plan per fill and how long the patient will have to fill partial months supply for the medication: NOT APPLICABLE     If the copay is under the $25 defined limit, per policy there will be no further investigation of need for financial assistance at this time unless patient requests. This referral has been communicated to the provider and handed off to the [facility] team for further processing and filling of prescribed medication.   ______________________________________________________________________  Please utilize this referral for viewing purposes as it will serve as the central location for all relevant documentation and updates.

## 2019-03-05 ENCOUNTER — Encounter
Admit: 2019-03-05 | Discharge: 2019-04-03 | Payer: PRIVATE HEALTH INSURANCE | Attending: Audiologist | Primary: Audiologist

## 2019-03-05 ENCOUNTER — Encounter
Admit: 2019-03-05 | Discharge: 2019-03-06 | Payer: PRIVATE HEALTH INSURANCE | Attending: Student in an Organized Health Care Education/Training Program | Primary: Student in an Organized Health Care Education/Training Program

## 2019-03-05 DIAGNOSIS — H6123 Impacted cerumen, bilateral: Secondary | ICD-10-CM

## 2019-03-05 DIAGNOSIS — R1312 Dysphagia, oropharyngeal phase: Secondary | ICD-10-CM

## 2019-03-05 DIAGNOSIS — J392 Other diseases of pharynx: Principal | ICD-10-CM

## 2019-03-05 DIAGNOSIS — R1310 Dysphagia, unspecified: Principal | ICD-10-CM

## 2019-03-05 DIAGNOSIS — R490 Dysphonia: Secondary | ICD-10-CM

## 2019-03-05 DIAGNOSIS — Z720 Tobacco use: Secondary | ICD-10-CM

## 2019-03-05 DIAGNOSIS — J329 Chronic sinusitis, unspecified: Secondary | ICD-10-CM

## 2019-03-05 MED ORDER — MUCINEX 600 MG TABLET, EXTENDED RELEASE
ORAL_TABLET | Freq: Two times a day (BID) | ORAL | 0 refills | 14 days | Status: CP
Start: 2019-03-05 — End: 2019-04-08

## 2019-03-05 MED ORDER — DOXYCYCLINE HYCLATE 100 MG TABLET
ORAL_TABLET | Freq: Two times a day (BID) | ORAL | 0 refills | 14 days | Status: CP
Start: 2019-03-05 — End: 2019-03-14

## 2019-03-05 MED ORDER — METHYLPREDNISOLONE 4 MG TABLETS IN A DOSE PACK
ORAL_TABLET | 0 refills | 0 days | Status: SS
Start: 2019-03-05 — End: 2019-04-08

## 2019-03-05 NOTE — Unmapped (Signed)
Flexible endoscope serial number  [id number]  used today by Rupali Shah, MD

## 2019-03-05 NOTE — Unmapped (Signed)
Otolaryngology New Patient Consult Visit    Reason for visit:  [name] is seen in consultation at the request of Fawn Kirk* for the evaluation of sore throat    History of Present Illness:  [name] is a 63 y.o. year old male who presents with a 6  months history of sore throat with eating. He has intermitent choking on solids and liquids. He has choked on eggs and breads and meats. He has to chew it up fine to get it to go down well. There is burning with liquids but no choking. No pneumonias. He has had 25lbs weight loss in 8 months 2/2 prostate cancer treatment.     6 bottles water/daily   8-9 pepsis/day.     Severity: moderate    Voice symptoms are described as None  Singing voice symptoms include: None    Associated symptoms:  Reflux symptoms include: none\  Airway symptoms include: None      Past Medical History:  Past Medical History:   Diagnosis Date   ??? Asthma    ??? Chronic headaches    ??? Colon polyp    ??? Enlarged prostate    ??? GERD (gastroesophageal reflux disease) 02/08/2013   ??? Hepatitis C    ??? Hx of substance abuse (CMS-HCC)     hx of recreational drug use (cocaine IV, intranasal and crack),    ??? Infectious viral hepatitis    ??? Lung nodule seen on imaging study 07/12/2016   ??? Osteoporosis    ??? Other emphysema (CMS-HCC)    ??? Renal calculus    ??? Skin cancer    ??? Tobacco abuse 02/08/2013       Past Surgical History:  Past Surgical History:   Procedure Laterality Date   ??? CERVICAL FUSION      cage   ??? PR COLSC FLX W/RMVL OF TUMOR POLYP LESION SNARE TQ N/A 10/20/2015    Procedure: COLONOSCOPY FLEX; W/REMOV TUMOR/LES BY SNARE;  Surgeon: Annie Paras, MD;  [facility]: [facility];  Service: Gastroenterology   ??? PR EXCISION SUBMAXILLARY GLAND Right 03/31/2015    Procedure: EXC SUBMANDIBULAR GLAND;  Surgeon: Lauralee Evener, MD;  Location: ASC OR [facility];  Service: ENT   ??? PR UPPER GI ENDOSCOPY,DIAGNOSIS N/A 08/18/2017    Procedure: UGI ENDO, INCLUDE ESOPHAGUS, STOMACH, & DUODENUM &/OR JEJUNUM; DX W/WO COLLECTION SPECIMN, BY BRUSH OR WASH;  Surgeon: Rona Ravens, MD;  Location: [facility];  Service: Gastroenterology       Medications:    Current Outpatient Medications:   ???  albuterol HFA 90 mcg/actuation inhaler, Inhale 2 puffs every four (4) hours as needed for wheezing., Disp: 3 Inhaler, Rfl: 3  ???  aspirin (ECOTRIN) 81 MG tablet, Take 81 mg by mouth daily., Disp: , Rfl:   ???  atorvastatin (LIPITOR) 40 MG tablet, TAKE 1/2 TABLET(20 MG) BY MOUTH EVERY NIGHT FOR CHOLESTEROL, Disp: 45 tablet, Rfl: 0  ???  cholecalciferol, vitamin D3, (VITAMIN D3) 1,000 unit capsule, Take 1,000 Units by mouth daily., Disp: , Rfl:   ???  cyanocobalamin (VITAMIN B-12) 250 MCG tablet, Take 250 mcg by mouth daily., Disp: , Rfl:   ???  ferrous sulfate 325 mg (65 mg iron) CpER, Take 1 tablet by mouth daily. , Disp: , Rfl:   ???  fluticasone propion-salmeterol (ADVAIR DISKUS) 250-50 mcg/dose diskus, Inhale 1 puff Two (2) times a day., Disp: 3 each, Rfl: 3  ???  lactulose (CHRONULAC) 10  gram/15 mL solution, TK 30 ML PO D, Disp: , Rfl:   ???  loratadine (CLARITIN) 10 mg tablet, Take 10 mg by mouth daily., Disp: , Rfl:   ???  multivitamin (TAB-A-VITE/THERAGRAN) per tablet, Take 1 tablet by mouth daily., Disp: , Rfl:   ???  rifAXIMin (XIFAXAN) 550 mg Tab, Take 1 tablet (550 mg total) by mouth Two (2) times a day., Disp: 60 tablet, Rfl: 6  ???  SUBOXONE 8-2 mg sublingual film, DIS 1 FILM UNT BID, Disp: , Rfl: 0  ???  tamsulosin (FLOMAX) 0.4 mg capsule, Take 1 capsule (0.4 mg total) by mouth daily. (Patient taking differently: Take 0.4 mg by mouth two (2) times a day. ), Disp: 90 capsule, Rfl: 3  ???  vit C,E-Zn-coppr-lutein-zeaxan (PRESERVISION AREDS 2) 250-200-40-1 mg-unit-mg-mg cap, Take 1 tablet by mouth Two (2) times a day., Disp: , Rfl:   ???  vitamin A 16109 UNIT capsule, Take 10,000 Units by mouth daily., Disp: , Rfl:      Allergies:  Allergies   Allergen Reactions   ??? Bicalutamide Rash   ??? Penicillins Rash       Family History:  Family History   Problem Relation Age of Onset   ??? Heart attack Father    ??? Hypertension Father    ??? Lung cancer Mother    ??? Bone cancer Mother    ??? Hypertension Mother    ??? Hepatitis Sister        Social History:  Social History     Tobacco Use   ??? Smoking status: Heavy Tobacco Smoker     Packs/day: 1.00     Types: Cigarettes     Start date: 06/16/1969   ??? Smokeless tobacco: Former Neurosurgeon     Types: Chew     Quit date: 2009   ??? Tobacco comment: REfused tobacco cessation referral   Substance Use Topics   ??? Alcohol use: No     Alcohol/week: 0.0 standard drinks   ??? Drug use: Not Currently     Types: Cocaine, IV     Comment: History of cocaine use years ago (intranasal, IV and crack), marijuana use.        Review of Systems:  The balance of 11 systems were reviewed and are negative except as noted in HPI, intake form, or below.   .    Physical Exam:   Constitutional:  Vitals reviewed in chart, patient has normal appearance. Well nourished, well-developed, no acute distress, thin mmale  Voice:  Mildly hoarse, rough  Respiration:  Breathing comfortably, no stridor.  CV: No clubbing/cyanosis/edema in hands.   Eyes:  extraocular motion intact, sclera normal.   Neuro:  Alert and oriented times 3, Cranial nerves 2-12 intact and symmetric bilaterally.   Head and Face:  Skin with no masses or lesions, sinuses nontender to palpation, facial nerve fully intact.   Salivary Glands:  Parotid and submandibular glands normal bilaterally.   Ears: B cerumen impaction.   Nose:  External nose midline, anterior rhinoscopy is normal with limited visualization just to the anterior interior turbinate.   Oral Cavity/Oropharynx/Lips:  Normal mucous membranes, normal floor of mouth/tongue/oropharynx, no masses or lesions are noted.  Upper dentures.   Pharyngeal Walls:  No masses noted.  Posterior pharyngeal wall drainage noted   neck/Lymph:  No lymphadenopathy, no thyroid masses.  Larynx: Mirror evaluation insufficient to visualize secondary to prominent gag      Procedure Note    Endoscopy Type:  Flexible Fiberoptic Videostroboscopy  Indications/TimeOut:  To better evaluate the patient???s symptoms, fiberoptic videostroboscopy is indicated.   A time out identifying the patient, the procedure, the location of the procedure and any concerns was performed prior to beginning the procedure.    Procedure Details:    The patient was placed in the sitting position.  After topical anesthesia and decongestion with oxymetazoline and 1% lidocaine, the flexible laryngoscope was passed.  The microphone was used to trigger the xenon stroboscopic light source.    -  Nasal cavity  -purulence emanating from the left middle meatus.  Right evidence of maxillary antrostomy.  Small anterior septal perforation  -  Nasopharynx - there was no evidence of mass or lesion.  -  Hypopharynx - There were no lesions in the pyriformis, epiglottis, or base of tongue.  Significant thickened mucus yellow in color at the base of tongue and left piriform sinus.  -  Larynx -  there was mild-moderate interarytenoid edema, no erythema.  -  Vocal Folds -     Mobility: Normal bilaterally   Amplitude: Symmetric bilaterally   Mucosal Wave: Mucosal wave intact bilaterally   Closure: Complete   Lesions/Other findings: Reinke's edema Mild bilaterally     .Marland KitchenMarland Kitchenpus in larynx and L mm. Open os on right. reinkes.    Condition:  Stable.  Patient tolerated procedure well.    Complications:  None    Procedure: cerumen removal right, aborted on left 2/2 pain  Preop Dx: cerumen  Anesthesia: none  Description: cerumen removed from affected ear(s) using microinstruments and otoscopy    Voice- Related Quality of Life (VR-QOL) Measure:  ?? I have trouble speaking loudly or being heard in noisy situations: 1  ?? I run out of air and need to take frequent breaths when talking: 1  ?? I sometimes do not know what will come out when I begin speaking: 1  ?? I am sometimes anxious or frustrated because of my voice: 1  ?? I sometimes get depressed because of my voice: 1  ?? I have trouble using the telephone because of my voice: 1  ?? I have trouble doing my job or practicing my profession because of my voice: 1  ?? I avoid going out socially because of my voice: 1  ?? I have to repeat myself to be understood: 1  ?? I have become less outgoing because of my voice: 1  VRQOL Raw Score: 10  Calculated Score: 100    Glottal Function Index:  ?? Speaking took extra effort: 0  ?? Throat discomfort or pain after using your voice: 0  ?? Vocal fatigue (voice weakened as you talked): 0  ?? Voice cracks or sounds different: 0  Glottal Function Index Total: 0      FEES was performed with speech-language pathologist.  There was residue solids within the base of tongue and piriform sinuses.  This was largely related to mucosal dryness and mucositis.  No penetration or aspiration.  Please see speech pathology note for full details    Assessment:   [name] is a 62 y.o. year old male who presents today with symptoms of sore throat odynophagia.    He  has a past medical history of Asthma, Chronic headaches, Colon polyp, Enlarged prostate, GERD (gastroesophageal reflux disease) (02/08/2013), Hepatitis C, substance abuse (CMS-HCC), Infectious viral hepatitis, Lung nodule seen on imaging study (07/12/2016), Osteoporosis, Other emphysema (CMS-HCC), Renal calculus, Skin cancer, and Tobacco abuse (02/08/2013).      Findings and diagnoses include:  Odynophagia  Left maxillary sinusitis  Mucositis/pharyngitis  Cerumen impaction    Plan:  Recommend a course of doxycycline x2 weeks, Medrol Dosepak, Mucinex 600 twice a day for 2 weeks.  He will follow-up in 6 to 8 weeks time for reassessment with CT brain lab.  Additionally recommend that he decrease his 6-9 Pepsi's per day to 1.  Increase hydration to 8 glasses of water a day.  Debrox solution to the left ear twice daily.  Will complete removal at follow-up

## 2019-03-05 NOTE — Unmapped (Signed)
Sinus infection and throat infection.   Recommend prescribed medications.     Decrease pepsi to one daily.   Increase water to 8 daily.     Fu 6-8weeks with CT scan.     DEBROX ear drops OTC for L ear.

## 2019-03-05 NOTE — Unmapped (Signed)
[facility] SPEECH [place] [address]  OUTPATIENT SPEECH PATHOLOGY  03/05/2019             Patient Name: Harold Richards  Date of Birth:[birth year]  Session Number: 1  Diagnosis:   Encounter Diagnosis   Name Primary?   ??? Dysphagia, unspecified type Yes      Date of Evaluation: 03/05/19  Date of Symptom Onset: 09/04/18  Referred by: Martina Sinner, MD  Reason for Referral: FEES    Assessment: Patient presents with mild-moderate oropharyngeal dysphagia. Significant thickened mucus yellow in color at the base of tongue and left lateral channel, and left piriform sinus noted prior to initiation of FEES. Dysphagia likely secondary to mucus build-up noted at baseline, as opposed to weakness or poor timing/coordination. Swallow characterized by timely trigger of swallow, adequate apposition of base of tongue to posterior pharyngeal wall, and adequate pharyngeal constriction. Residue observed post-swallow in the vallecular space with puree and solid trials. Residue cleared with multiple sips of water. Difficulty clearing puree and solid likely due to sticky, mucous-like substance that did not clear with multiple sips of water. No penetration or aspiration observed on today???s exam. Consistencies tested: thin liquid, puree, cracker solid. Patient educated and provided with compensatory strategies, to include slow rate, small bites/sips, and alternating solids and liquids to clear residue. Per Dr. Sherryll Burger, patient to pursue course of doxycycline x2 weeks, Medrol Dosepak, Mucinex 600 twice a day for 2 weeks prior to follow up.  Dysphagia Diagnosis: Mild to moderate pharyngeal stage dysphagia  Dysphagia Severity Index: 3 - Mild-moderate dysphagia  1 - Minimal dysphagia-video swallow shows slight deviance from a normal swallow. Patient may report a change in sensation during swallow. No change in diet is required.   2 - Mild dysphagia-oropharyngeal dysphagia present, which can be managed by specific swallow suggestions. Slight modification in consistency of diet may be indicated.   3 - Mild-moderate dysphagia-potential for aspiration exists but is diminished by specific swallow techniques and a modified diet. Time for eating is significantly increased; thus supplemental nutrition may be indicated.   4 - Moderate dysphagia-significant potential for aspiration exists. Trace aspiration of one or more consistencies may be seen under videofluoroscopy. Patient may eat certain consistencies by using specific techniques to minimize potential for aspiration   and/or to facilitate swallowing. Supervision at mealtimes required. May require supplemental nutrition orally or via feeding tube.   5 - Moderately severe dysphagia-patient aspirates 5% to 10% on one or more consistencies, with potential for aspiration on all consistencies. Potential for aspiration minimized by specific swallow instructions. Cough reflex absent or nonprotective.   Alternative mode of feeding required to maintain patient's nutritional needs. If pulmonary status is compromised, nothing by mouth may be indicated.   6 - Severe dysphagia-more than 10% aspiration for all consistencies. Nothing by mouth recommended.      PO Recommendations : Regular Solids (no restrictions), Thin liquids    Recommended Compensatory Techniques : Alternate liquids and solids, Slow rate, Small sips/bites, Upright 90 degrees     Treatment Recommendations: N/A       Prognosis:  Good    Negative Prognosis Rationale: N/A       Positive Prognosis Rationale: symptom severity      Goals:  Patient and Family Goals: goal not directly stated     LTG #1: Patient will tolerate least restrictive diet without signs or symptoms of aspiration      SUBJECTIVE:  Harold Richards is a 63 year old male  with a past medical history of Asthma, Chronic headaches, Colon polyp, Enlarged prostate, GERD (gastroesophageal reflux disease) (02/08/2013), Hepatitis C, substance abuse (CMS-HCC), Infectious viral hepatitis, Lung nodule seen on imaging study (07/12/2016), Osteoporosis, Other emphysema (CMS-HCC), Renal calculus, Skin cancer, and Tobacco abuse (02/08/2013). Harold Richards is seen in consultation at the request of Martina Sinner, MD for evaluation of dysphagia. Patient states that his throat has felt sore when swallowing for the last 6 months. He gets choked when eating solids. Liquids go down well, but he feels like it ???burns??? the throat. He is on a regular diet with thin liquids.    Communication Preference: Verbal    Barriers to Learning: No Barriers      Prior treatment for referral reason: No        Precautions: None    Prior Function: Independent      Past Medical History:   Diagnosis Date   ??? Asthma    ??? Chronic headaches    ??? Colon polyp    ??? Enlarged prostate    ??? GERD (gastroesophageal reflux disease) 02/08/2013   ??? Hepatitis C    ??? Hx of substance abuse (CMS-HCC)     hx of recreational drug use (cocaine IV, intranasal and crack),    ??? Infectious viral hepatitis    ??? Lung nodule seen on imaging study 07/12/2016   ??? Osteoporosis    ??? Other emphysema (CMS-HCC)    ??? Renal calculus    ??? Skin cancer    ??? Tobacco abuse 02/08/2013      Family History   Problem Relation Age of Onset   ??? Heart attack Father    ??? Hypertension Father    ??? Lung cancer Mother    ??? Bone cancer Mother    ??? Hypertension Mother    ??? Hepatitis Sister      Past Surgical History:   Procedure Laterality Date   ??? CERVICAL FUSION      cage   ??? PR COLSC FLX W/RMVL OF TUMOR POLYP LESION SNARE TQ N/A 10/20/2015    Procedure: COLONOSCOPY FLEX; W/REMOV TUMOR/LES BY SNARE;  Surgeon: Annie Paras, MD;  [facility]: GI [facility];  Service: Gastroenterology   ??? PR EXCISION SUBMAXILLARY GLAND Right 03/31/2015    Procedure: EXC SUBMANDIBULAR GLAND;  Surgeon: Lauralee Evener, MD;  Location: ASC OR [facility];  Service: ENT   ??? PR UPPER GI ENDOSCOPY,DIAGNOSIS N/A 08/18/2017    Procedure: UGI ENDO, INCLUDE ESOPHAGUS, STOMACH, & DUODENUM &/OR JEJUNUM; DX W/WO COLLECTION SPECIMN, BY BRUSH OR WASH;  Surgeon: Rona Ravens, MD;  Location: GI PROC[facility]URES [facility];  Service: Gastroenterology      Allergies   Allergen Reactions   ??? Bicalutamide Rash   ??? Penicillins Rash     Social History     Tobacco Use   ??? Smoking status: Heavy Tobacco Smoker     Packs/day: 1.00     Years: 50.00     Pack years: 50.00     Types: Cigarettes     Start date: 06/16/1969   ??? Smokeless tobacco: Former Neurosurgeon     Types: Chew     Quit date: 2009   ??? Tobacco comment: REfused tobacco cessation referral   Substance Use Topics   ??? Alcohol use: No     Alcohol/week: 0.0 standard drinks      Current Outpatient Medications   Medication Sig Dispense Refill   ??? albuterol HFA 90 mcg/actuation inhaler Inhale 2 puffs every  four (4) hours as needed for wheezing. 3 Inhaler 3   ??? aspirin (ECOTRIN) 81 MG tablet Take 81 mg by mouth daily.     ??? atorvastatin (LIPITOR) 40 MG tablet TAKE 1/2 TABLET(20 MG) BY MOUTH EVERY NIGHT FOR CHOLESTEROL 45 tablet 0   ??? cholecalciferol, vitamin D3, (VITAMIN D3) 1,000 unit capsule Take 1,000 Units by mouth daily.     ??? cyanocobalamin (VITAMIN B-12) 250 MCG tablet Take 250 mcg by mouth daily.     ??? doxycycline (VIBRA-TABS) 100 MG tablet Take 1 tablet (100 mg total) by mouth Two (2) times a day for 14 days. 28 tablet 0   ??? ferrous sulfate 325 mg (65 mg iron) CpER Take 1 tablet by mouth daily.      ??? fluticasone propion-salmeterol (ADVAIR DISKUS) 250-50 mcg/dose diskus Inhale 1 puff Two (2) times a day. 3 each 3   ??? guaiFENesin (MUCINEX) 600 mg 12 hr tablet Take 1 tablet (600 mg total) by mouth Two (2) times a day for 14 days. 28 tablet 0   ??? lactulose (CHRONULAC) 10 gram/15 mL solution TK 30 ML PO D     ??? loratadine (CLARITIN) 10 mg tablet Take 10 mg by mouth daily.     ??? methylPREDNISolone (MEDROL DOSEPACK) 4 mg tablet follow package directions. Dispense: 1 pack 1 tablet 0   ??? multivitamin (TAB-A-VITE/THERAGRAN) per tablet Take 1 tablet by mouth daily.     ??? rifAXIMin (XIFAXAN) 550 mg Tab Take 1 tablet (550 mg total) by mouth Two (2) times a day. 60 tablet 6   ??? SUBOXONE 8-2 mg sublingual film DIS 1 FILM UNT BID  0   ??? tamsulosin (FLOMAX) 0.4 mg capsule Take 1 capsule (0.4 mg total) by mouth daily. (Patient taking differently: Take 0.4 mg by mouth two (2) times a day. ) 90 capsule 3   ??? vit C,E-Zn-coppr-lutein-zeaxan (PRESERVISION AREDS 2) 250-200-40-1 mg-unit-mg-mg cap Take 1 tablet by mouth Two (2) times a day.     ??? vitamin A 16109 UNIT capsule Take 10,000 Units by mouth daily.       No current facility-administered medications for this visit.      Objective Swallow  Cognitive-Communication Status : WNL  Dysphagia History: 6 month history of sore throat on swallow; intermittent choking on solids  Diet Prior to this Study: regular diet, thin liquids  Respiratory Status : Room air  Positioning : Upright in chair    Presentation Methods Used During Study  Bolus Presentation : Cup Sip, Fed self    Thin Liquids   Oral Stage: WFL  Swallow Initiation : WFL  Nasopharyngeal Reflux : None noted  Pharyngeal Stage : WFL  Penetration Aspiration Score: 1 - Material does not enter airway  Aspiration Volume : None  Esophageal Phase Screening : UES Function WFL    Puree  Oral Stage : WFL  Swallow Initiation : WFL  Nasopharyngeal Reflux: None noted  Pharyngeal Stage : Residue after swallow, Residue cleared in subsequent swallows  Penetration Aspiration Score: 1 - Material does not enter airway  Aspiration Volume : None  Esophageal Phase Screening : UES Function WFL    Solids   Oral Stage : WFL  Swallow Initiation : WFL  Nasopharyngeal Reflux: None noted  Pharyngeal Stage : Residue after swallow, Residue cleared in subsequent swallows  Penetration Aspiration Score: 1 - Material does not enter airway  Aspiration Volume : None  Esophageal Phase Screening : UES Function [facility]     Education Provided:  Patient((FEES interpretation and recommendations))    Response to Education: Understanding verbalized    Session Duration : 30    Today's Charges (noted here with $$):     SLP Evaluations  $$ SP Swallow Evaluation [mins]: 30      I was physically present and immediately available to direct and supervise tasks that were related to patient management by Graduate Student Intern, Alita Chyle, BA. The direction and supervision was continuous throughout the time these tasks were performed.    Patient wore mask during case history; doffed mask during FEES    I attest that I have reviewed the above information.  Signed: Patricia Nettle, SLP  03/05/2019 1:46 PM

## 2019-03-11 ENCOUNTER — Encounter: Admit: 2019-03-11 | Discharge: 2019-03-12 | Payer: PRIVATE HEALTH INSURANCE

## 2019-03-11 DIAGNOSIS — D649 Anemia, unspecified: Secondary | ICD-10-CM

## 2019-03-11 DIAGNOSIS — R5382 Chronic fatigue, unspecified: Principal | ICD-10-CM

## 2019-03-11 LAB — CBC W/ AUTO DIFF
BASOPHILS ABSOLUTE COUNT: 0.1 10*9/L (ref 0.0–0.1)
EOSINOPHILS ABSOLUTE COUNT: 0.1 10*9/L (ref 0.0–0.4)
EOSINOPHILS RELATIVE PERCENT: 1.5 %
HEMATOCRIT: 40.8 % — ABNORMAL LOW (ref 41.0–53.0)
HEMOGLOBIN: 12.9 g/dL — ABNORMAL LOW (ref 13.5–17.5)
LARGE UNSTAINED CELLS: 2 % (ref 0–4)
LYMPHOCYTES RELATIVE PERCENT: 21.4 %
MEAN CORPUSCULAR HEMOGLOBIN CONC: 31.5 g/dL (ref 31.0–37.0)
MEAN CORPUSCULAR HEMOGLOBIN: 35 pg — ABNORMAL HIGH (ref 26.0–34.0)
MEAN CORPUSCULAR VOLUME: 111.2 fL — ABNORMAL HIGH (ref 80.0–100.0)
MONOCYTES ABSOLUTE COUNT: 0.4 10*9/L (ref 0.2–0.8)
MONOCYTES RELATIVE PERCENT: 5.9 %
NEUTROPHILS ABSOLUTE COUNT: 4.3 10*9/L (ref 2.0–7.5)
NEUTROPHILS RELATIVE PERCENT: 68.1 %
PLATELET COUNT: 217 10*9/L (ref 150–440)
RED BLOOD CELL COUNT: 3.67 10*12/L — ABNORMAL LOW (ref 4.50–5.90)
RED CELL DISTRIBUTION WIDTH: 13.1 % (ref 12.0–15.0)
WBC ADJUSTED: 6.3 10*9/L (ref 4.5–11.0)

## 2019-03-11 LAB — TSH: THYROID STIMULATING HORMONE: 1.75 u[IU]/mL (ref 0.600–3.300)

## 2019-03-11 LAB — IRON & TIBC
IRON SATURATION (CALC): 47 % (ref 20–50)
IRON: 110 ug/dL (ref 35–165)
TRANSFERRIN: 183.8 mg/dL — ABNORMAL LOW (ref 200.0–380.0)

## 2019-03-11 LAB — FOLATE: Folate:MCnc:Pt:Ser/Plas:Qn:: >20 — ABNORMAL HIGH

## 2019-03-11 LAB — RED CELL DISTRIBUTION WIDTH: Lab: 13.1

## 2019-03-11 LAB — SMEAR REVIEW

## 2019-03-11 LAB — THYROID STIMULATING HORMONE: Thyrotropin:ACnc:Pt:Ser/Plas:Qn:: 1.75

## 2019-03-11 LAB — IRON SATURATION (CALC): Iron saturation:MFr:Pt:Ser/Plas:Qn:: 47

## 2019-03-11 LAB — FERRITIN: Ferritin:MCnc:Pt:Ser/Plas:Qn:: 28.4

## 2019-03-13 NOTE — Unmapped (Signed)
Letter written for his employer.

## 2019-03-14 ENCOUNTER — Telehealth: Admit: 2019-03-14 | Discharge: 2019-03-15 | Payer: PRIVATE HEALTH INSURANCE

## 2019-03-14 DIAGNOSIS — C61 Malignant neoplasm of prostate: Secondary | ICD-10-CM

## 2019-03-14 DIAGNOSIS — J438 Other emphysema: Secondary | ICD-10-CM

## 2019-03-14 DIAGNOSIS — B182 Chronic viral hepatitis C: Secondary | ICD-10-CM

## 2019-03-14 DIAGNOSIS — Z7689 Persons encountering health services in other specified circumstances: Secondary | ICD-10-CM

## 2019-03-14 DIAGNOSIS — Z720 Tobacco use: Secondary | ICD-10-CM

## 2019-03-14 DIAGNOSIS — R51 Headache: Secondary | ICD-10-CM

## 2019-03-14 DIAGNOSIS — K7290 Hepatic failure, unspecified without coma: Secondary | ICD-10-CM

## 2019-03-14 DIAGNOSIS — K7469 Other cirrhosis of liver: Principal | ICD-10-CM

## 2019-03-14 DIAGNOSIS — G4700 Insomnia, unspecified: Secondary | ICD-10-CM

## 2019-03-14 DIAGNOSIS — M818 Other osteoporosis without current pathological fracture: Secondary | ICD-10-CM

## 2019-03-14 NOTE — Unmapped (Signed)
Previsit complete    Time spent on phone:  6 minutes    Reason for visit: Establish care    General Consent to Treat (GCT) for non-epic video visits only: Verbal consent    Allergies reviewed: Yes    Medication reviewed: Yes    Refills needed, if any:     Travel Screening: completed    Falls Risk: completed     Hark (DV) Screening: completed    HCDM reviewed and updated in Epic:  Completed       HARK Screening  HARK Screening  Within the last year, have you been humiliated or emotionally abused in other ways by your partner or ex-partner?: No  Within the last year, have you been afraid of your partner or ex-partner?: No  Within the last year, have you been raped or forced to have any kind of sexual activity by your partner or ex-partner?: No  Within the last year, have you been kicked, hit, slapped, or otherwise physically hurt by your partner or ex-partner?: No    PHQ2  PHQ-2 Total Score : 0

## 2019-03-14 NOTE — Unmapped (Addendum)
Cirrhosis  Hep C treated  Hepatic encephalopathy  Headaches  Insomnia  Osteoporosis  Emphysema  Prostate cancer  Tobacco abuse  This 63 year old man with multiple medical problems is establishing primary care.  He has a history of hepatitis C and in 2016 was treated with Harvoni and following treatment his viral load was undetectable.  His LFTs have been normal and his MRI shows no evidence of cirrhosis though he does have a nodule in the liver that is being followed.  He has had a couple episodes of confusion that have been attributed to hepatic encephalopathy and is currently taking rifaximin twice a day and lactulose 15 mL's twice a day which seems to be helping with his confusion.  He is followed closely in the liver clinic.    He has a history of COPD and uses Advair and albuterol and does get short of breath with exertion.  He does not wheeze but does have a chronic cough.  He continues to smoke about a pack a day.  He is getting yearly screening CT scans.    He has osteoporosis and is followed in the endocrinology clinic and is being treated with yearly infusions of Reclast.    At the end of last year he was diagnosed with Gleason stage VIII prostate cancer and was treated with radiation therapy and implants and continues on Lupron every 3 months.  He does complain of fatigue.  He has some urinary incontinence and he has loss of libido and is no longer sexually active.    He has chronic neck pain and is followed in a pain clinic and takes sublingual Suboxone twice a day.  He had neck surgery in 2006.  He also has chronic headaches.    He has a history of chest pains, but had a normal nuclear stress test 6 months ago.  Several years before that he had a normal exercise tolerance test.  He does have calcification in his coronary arteries as demonstrated on CT scan.  He is on atorvastatin 40 mg a day.  His last episode of chest pain occurred 2 months ago while he was helping his wife make a bad and the pain lasted for about a day.    Past medical history  He has a history of sialolithiasis with removal of the stone.  He has a history of nephrolithiasis.  Neck surgery and chronic neck pain as mentioned above  Nasal septal perforation  Insomnia    Social history  He is married and has children and grandchildren who live not far from him.  He does not drink and no longer uses any illegal drugs.  He is employed at [employer] and water.    Family history  He has a family history of lung cancer and diabetes.    Review of systems  He has trouble sleeping, but the rest is negative except mentioned above in the history of present illness    Assessment:  Presumed hepatic encephalopathy  Hep C treated  Coronary artery calcium treated, presumably asymptomatic  Emphysema/COPD  Chronic neck pain  Chronic headaches  Smoking  Osteoporosis treated  Prostate cancer status post radiation currently treated with Lupron  Plan:  Continue current meds and follow-up in 6 months  Needs zoster vaccine and Tdap    Current Outpatient Medications   Medication Sig Dispense Refill   ??? albuterol HFA 90 mcg/actuation inhaler Inhale 2 puffs every four (4) hours as needed for wheezing. 3 Inhaler 3   ???  aspirin (ECOTRIN) 81 MG tablet Take 81 mg by mouth daily.     ??? atorvastatin (LIPITOR) 40 MG tablet TAKE 1/2 TABLET(20 MG) BY MOUTH EVERY NIGHT FOR CHOLESTEROL 45 tablet 0   ??? cholecalciferol, vitamin D3, (VITAMIN D3) 1,000 unit capsule Take 1,000 Units by mouth daily.     ??? cyanocobalamin (VITAMIN B-12) 250 MCG tablet Take 250 mcg by mouth daily.     ??? ferrous sulfate 325 mg (65 mg iron) CpER Take 1 tablet by mouth daily.      ??? fluticasone propion-salmeterol (ADVAIR DISKUS) 250-50 mcg/dose diskus Inhale 1 puff Two (2) times a day. 3 each 3   ??? guaiFENesin (MUCINEX) 600 mg 12 hr tablet Take 1 tablet (600 mg total) by mouth Two (2) times a day for 14 days. 28 tablet 0   ??? lactulose (CHRONULAC) 10 gram/15 mL solution TK 30 ML PO D     ??? loratadine (CLARITIN) 10 mg tablet Take 10 mg by mouth daily.     ??? multivitamin (TAB-A-VITE/THERAGRAN) per tablet Take 1 tablet by mouth daily.     ??? rifAXIMin (XIFAXAN) 550 mg Tab Take 1 tablet (550 mg total) by mouth Two (2) times a day. 60 tablet 6   ??? SUBOXONE 8-2 mg sublingual film DIS 1 FILM UNT BID  0   ??? tamsulosin (FLOMAX) 0.4 mg capsule Take 1 capsule (0.4 mg total) by mouth daily. (Patient taking differently: Take 0.4 mg by mouth two (2) times a day. ) 90 capsule 3   ??? vit C,E-Zn-coppr-lutein-zeaxan (PRESERVISION AREDS 2) 250-200-40-1 mg-unit-mg-mg cap Take 1 tablet by mouth Two (2) times a day.     ??? vitamin A 16109 UNIT capsule Take 10,000 Units by mouth daily.     ??? methylPREDNISolone (MEDROL DOSEPACK) 4 mg tablet follow package directions. Dispense: 1 pack (Patient not taking: Reported on 03/14/2019) 1 tablet 0     No current facility-administered medications for this visit.        I reviewed past medical history.       Medication adherence and barriers to the treatment plan have been addressed. Opportunities to optimize healthy behaviors have been discussed. Patient / caregiver voiced understanding.       I spent 33 minutes on the real-time audio and video with the patient. I spent an additional 15 minutes on pre- and post-visit activities.     The patient was physically located in [place] or a state in which I am permitted to provide care. The patient and/or parent/guardian understood that s/he may incur co-pays and cost sharing, and agreed to the telemedicine visit. The visit was reasonable and appropriate under the circumstances given the patient's presentation at the time.    The patient and/or parent/guardian has been advised of the potential risks and limitations of this mode of treatment (including, but not limited to, the absence of in-person examination) and has agreed to be treated using telemedicine. The patient's/patient's family's questions regarding telemedicine have been answered.     If the visit was completed in an ambulatory setting, the patient and/or parent/guardian has also been advised to contact their provider???s office for worsening conditions, and seek emergency medical treatment and/or call 911 if the patient deems either necessary.          This note prepared using Conservation officer, historic buildings.  Transcription errors likely.

## 2019-03-21 NOTE — Unmapped (Signed)
[facility] Clinical Assessment & Refill Coordination Note    Harold Richards, [place]: 03-23-1956  Phone: [phone] (home)     All above HIPAA information was verified with patient.     Specialty Medication(s):   Infectious Disease: Xifaxan     Current Outpatient Medications   Medication Sig Dispense Refill   ??? albuterol HFA 90 mcg/actuation inhaler Inhale 2 puffs every four (4) hours as needed for wheezing. 3 Inhaler 3   ??? aspirin (ECOTRIN) 81 MG tablet Take 81 mg by mouth daily.     ??? atorvastatin (LIPITOR) 40 MG tablet TAKE 1/2 TABLET(20 MG) BY MOUTH EVERY NIGHT FOR CHOLESTEROL 45 tablet 0   ??? cholecalciferol, vitamin D3, (VITAMIN D3) 1,000 unit capsule Take 1,000 Units by mouth daily.     ??? cyanocobalamin (VITAMIN B-12) 250 MCG tablet Take 250 mcg by mouth daily.     ??? ferrous sulfate 325 mg (65 mg iron) CpER Take 1 tablet by mouth daily.      ??? fluticasone propion-salmeterol (ADVAIR DISKUS) 250-50 mcg/dose diskus Inhale 1 puff Two (2) times a day. 3 each 3   ??? lactulose (CHRONULAC) 10 gram/15 mL solution TK 30 ML PO D     ??? loratadine (CLARITIN) 10 mg tablet Take 10 mg by mouth daily.     ??? methylPREDNISolone (MEDROL DOSEPACK) 4 mg tablet follow package directions. Dispense: 1 pack (Patient not taking: Reported on 03/14/2019) 1 tablet 0   ??? multivitamin (TAB-A-VITE/THERAGRAN) per tablet Take 1 tablet by mouth daily.     ??? rifAXIMin (XIFAXAN) 550 mg Tab Take 1 tablet (550 mg total) by mouth Two (2) times a day. 60 tablet 6   ??? SUBOXONE 8-2 mg sublingual film DIS 1 FILM UNT BID  0   ??? tamsulosin (FLOMAX) 0.4 mg capsule Take 1 capsule (0.4 mg total) by mouth daily. (Patient taking differently: Take 0.4 mg by mouth two (2) times a day. ) 90 capsule 3   ??? vit C,E-Zn-coppr-lutein-zeaxan (PRESERVISION AREDS 2) 250-200-40-1 mg-unit-mg-mg cap Take 1 tablet by mouth Two (2) times a day.     ??? vitamin A 09811 UNIT capsule Take 10,000 Units by mouth daily.       No current facility-administered medications for this visit.         Changes to medications: [name] reports no changes at this time.    Allergies   Allergen Reactions   ??? Bicalutamide Rash   ??? Penicillins Rash       Changes to allergies: No    SPECIALTY MEDICATION ADHERENCE     Xifaxan 550 mg: 7 days of medicine on hand     Medication Adherence    Patient reported X missed doses in the last month: 0  Specialty Medication: Xifaxan 550mg   Patient is on additional specialty medications: No  Demonstrates understanding of importance of adherence: yes  Informant: patient          Specialty medication(s) dose(s) confirmed: Regimen is correct and unchanged.     Are there any concerns with adherence? No    Adherence counseling provided? Not needed    CLINICAL MANAGEMENT AND INTERVENTION      Clinical Benefit Assessment:    Do you feel the medicine is effective or helping your condition? Yes    Clinical Benefit counseling provided? Not needed    Adverse Effects Assessment:    Are you experiencing any side effects? No    Are you experiencing difficulty administering your medicine?  No    Quality of Life Assessment:    How many days over the past month did your cirrhosis  keep you from your normal activities? For example, brushing your teeth or getting up in the morning. 0    Have you discussed this with your provider? Not needed    Therapy Appropriateness:    Is therapy appropriate? Yes, therapy is appropriate and should be continued    DISEASE/MEDICATION-SPECIFIC INFORMATION      N/A    PATIENT SPECIFIC NEEDS     ? Does the patient have any physical, cognitive, or cultural barriers? No    ? Is the patient high risk? No     ? Does the patient require a Care Management Plan? No     ? Does the patient require physician intervention or other additional services (i.e. nutrition, smoking cessation, social work)? No      SHIPPING     Specialty Medication(s) to be Shipped:   Infectious Disease: Xifaxan    Other medication(s) to be shipped: n/a Changes to insurance: No    Delivery Scheduled: Yes, Expected medication delivery date: 03/26/2019.     Medication will be delivered via Next Day Courier to the confirmed home address in [facility].    The patient will receive a drug information handout for each medication shipped and additional FDA Medication Guides as required.  Verified that patient has previously received a Conservation officer, historic buildings.    All of the patient's questions and concerns have been addressed.    Roderic Palau   [facility] Specialty Pharmacist

## 2019-03-25 MED FILL — XIFAXAN 550 MG TABLET: ORAL | 30 days supply | Qty: 60 | Fill #1

## 2019-03-25 MED FILL — XIFAXAN 550 MG TABLET: 30 days supply | Qty: 60 | Fill #1 | Status: AC

## 2019-04-07 ENCOUNTER — Encounter: Admit: 2019-04-07 | Discharge: 2019-04-08 | Payer: PRIVATE HEALTH INSURANCE

## 2019-04-07 DIAGNOSIS — R509 Fever, unspecified: Principal | ICD-10-CM

## 2019-04-07 LAB — URINALYSIS WITH CULTURE REFLEX
BILIRUBIN UA: NEGATIVE
BLOOD UA: NEGATIVE
GRANULAR CASTS: <1 /LPF — ABNORMAL HIGH
KETONES UA: NEGATIVE
LEUKOCYTE ESTERASE UA: NEGATIVE
NITRITE UA: NEGATIVE
PH UA: 5 (ref 5.0–9.0)
PROTEIN UA: NEGATIVE
RBC UA: 0 /HPF (ref <3)
SPECIFIC GRAVITY UA: 1.025 (ref 1.005–1.040)
SQUAMOUS EPITHELIAL: 2 /HPF (ref 0–5)
UROBILINOGEN UA: 0.2

## 2019-04-07 LAB — COMPREHENSIVE METABOLIC PANEL
ALBUMIN: 3.9 g/dL (ref 3.5–5.0)
ALKALINE PHOSPHATASE: 39 U/L (ref 38–126)
ALT (SGPT): 21 U/L (ref <50)
ANION GAP: 7 mmol/L (ref 7–15)
AST (SGOT): 29 U/L (ref 19–55)
BILIRUBIN TOTAL: 0.5 mg/dL (ref 0.0–1.2)
BLOOD UREA NITROGEN: 18 mg/dL (ref 7–21)
BUN / CREAT RATIO: 19
CALCIUM: 9.1 mg/dL (ref 8.5–10.2)
CHLORIDE: 113 mmol/L — ABNORMAL HIGH (ref 98–107)
CO2: 18 mmol/L — ABNORMAL LOW (ref 22.0–30.0)
CREATININE: 0.96 mg/dL (ref 0.70–1.30)
EGFR CKD-EPI AA MALE: >90 mL/min/{1.73_m2} (ref >=60)
GLUCOSE RANDOM: 148 mg/dL (ref 70–179)
POTASSIUM: 3.7 mmol/L (ref 3.5–5.0)
PROTEIN TOTAL: 6.5 g/dL (ref 6.5–8.3)

## 2019-04-07 LAB — CBC W/ AUTO DIFF
BASOPHILS RELATIVE PERCENT: 0.4 %
EOSINOPHILS RELATIVE PERCENT: 0.4 %
HEMATOCRIT: 41.1 % (ref 41.0–53.0)
HEMOGLOBIN: 13.4 g/dL — ABNORMAL LOW (ref 13.5–17.5)
LARGE UNSTAINED CELLS: 1 % (ref 0–4)
LYMPHOCYTES ABSOLUTE COUNT: 0.4 10*9/L — ABNORMAL LOW (ref 1.5–5.0)
LYMPHOCYTES RELATIVE PERCENT: 1.9 %
MEAN CORPUSCULAR HEMOGLOBIN CONC: 32.7 g/dL (ref 31.0–37.0)
MEAN CORPUSCULAR HEMOGLOBIN: 35.6 pg — ABNORMAL HIGH (ref 26.0–34.0)
MEAN CORPUSCULAR VOLUME: 108.7 fL — ABNORMAL HIGH (ref 80.0–100.0)
MEAN PLATELET VOLUME: 7.2 fL (ref 7.0–10.0)
MONOCYTES RELATIVE PERCENT: 5.4 %
NEUTROPHILS ABSOLUTE COUNT: 19.3 10*9/L — ABNORMAL HIGH (ref 2.0–7.5)
NEUTROPHILS RELATIVE PERCENT: 91.2 %
PLATELET COUNT: 170 10*9/L (ref 150–440)
RED BLOOD CELL COUNT: 3.78 10*12/L — ABNORMAL LOW (ref 4.50–5.90)
RED CELL DISTRIBUTION WIDTH: 13.2 % (ref 12.0–15.0)
WBC ADJUSTED: 21.2 10*9/L — ABNORMAL HIGH (ref 4.5–11.0)

## 2019-04-07 LAB — TROPONIN I: Troponin I.cardiac:MCnc:Pt:Ser/Plas:Qn:: <0.06

## 2019-04-07 LAB — SMEAR REVIEW

## 2019-04-07 LAB — SQUAMOUS EPITHELIAL: Lab: 2

## 2019-04-07 LAB — LACTATE BLOOD VENOUS
Lactate:SCnc:Pt:BldV:Qn:: 1
Lactate:SCnc:Pt:BldV:Qn:: 2.6 — ABNORMAL HIGH

## 2019-04-07 LAB — ANION GAP: Anion gap 3:SCnc:Pt:Ser/Plas:Qn:: 7

## 2019-04-07 LAB — MONOCYTES RELATIVE PERCENT: Lab: 5.4

## 2019-04-07 NOTE — Unmapped (Signed)
Continue POC. TCDB, I/S encouraged. VSSAF. Encouraged ambulation, increase fluids hydration. Will continue to assess and monitor.

## 2019-04-07 NOTE — Unmapped (Signed)
Care Management  Initial Transition Planning Assessment                CM met with patient in pt room.  Pt/visitors were wearing hospital provided masks for the duration of the interaction with CM.   CM was wearing hospital provided surgical mask.  CM was not within 6 foot of the patient/visitors during this interaction.       Type of Residence: Mailing Address:  [address]  [place] [address]  Contacts: Accompanied by: Alone  Patient Phone Number: [phone]        Medical Provider(s): Coralee North Zolotor, MD  Reason for Admission: Admitting Diagnosis:  Pneumonia of right upper lobe due to infectious organism [J18.9]  Past Medical History:   has a past medical history of Asthma, Chronic headaches, Colon polyp, Enlarged prostate, GERD (gastroesophageal reflux disease) (02/08/2013), Hepatitis C, substance abuse (CMS-HCC), Infectious viral hepatitis, Lung nodule seen on imaging study (07/12/2016), Osteoporosis, Other emphysema (CMS-HCC), Renal calculus, Skin cancer, and Tobacco abuse (02/08/2013).  Past Surgical History:   has a past surgical history that includes Cervical fusion; pr excision submaxillary gland (Right, 03/31/2015); pr colsc flx w/rmvl of tumor polyp lesion snare tq (N/A, 10/20/2015); and pr upper gi endoscopy,diagnosis (N/A, 08/18/2017).   Previous admit date: N/A    Primary Insurance- Payor: BCBS / Plan: BCBS BLUE OPTIONS/PPO/ADV / Product Type: *No Product type* /   Secondary Insurance ??? None  Prescription Coverage ??? bcbs  Preferred Pharmacy - [facility] [phone] - [place], [place] - [address]  [facility] [phone] - [place], [place] - [address] [place] [address] [place] [address]  [facility]    Transportation home: Private vehicle  Level of function prior to admission: Independent        General  Care Manager assessed the patient by : In person interview with patient, In person interview with family, Medical record review, Discussion with Clinical Care team Orientation Level: Oriented X4  Who provides care at home?: N/A  Reason for referral: Discharge Planning    Contact/Decision [facility]  Extended Emergency Contact Information  Primary Emergency Contact: Kugler,Joanna  Address: [address]           [place], [place] [address] [place]  Home Phone: [phone]  Relation: Spouse    Legal Next of Kin / Guardian / POA / Advance Directives     HCDM (patient stated preference): Pamala Duffel - Spouse - [phone]    Advance Directive (Medical Treatment)  Does patient have an advance directive covering medical treatment?: Patient does not have advance directive covering medical treatment.  Reason patient does not have an advance directive covering medical treatment:: Patient does not wish to complete one at this time.    Health Care Decision Maker [HCDM] (Medical & Mental Health Treatment)  Healthcare Decision Maker: HCDM documented in the HCDM/Contact Info section.  Information offered on HCDM, Medical & Mental Health advance directives:: Patient declined information.  Referral Made: Yes, advice to complete post discharge    Advance Directive (Mental Health Treatment)  Does patient have an advance directive covering mental health treatment?: Patient does not have advance directive covering mental health treatment.  Reason patient does not have an advance directive covering mental health treatment:: Patient does not wish to complete one at this time.    Patient Information  Lives with: Spouse/significant other    Type of Residence: Private residence  Location/Detail: [place]    Support Systems: Spouse    Responsibilities/Dependents at home?: No    Home Care services in place prior to admission?: No                  Equipment Currently Used at Home: none       Currently receiving outpatient dialysis?: No       Financial Information       Need for financial assistance?: No       Social Determinants of Health  Social Determinants of Health were addressed in provider documentation.  Please refer to patient history.  Social History     Socioeconomic History   ??? Marital status: Married     Spouse name: Not on file   ??? Number of children: Not on file   ??? Years of education: Not on file   ??? Highest education level: Not on file   Occupational History   ??? Not on file   Social Needs   ??? Financial resource strain: Not hard at all   ??? Food insecurity     Worry: Patient refused     Inability: Patient refused   ??? Transportation needs     Medical: Not on file     Non-medical: Not on file   Tobacco Use   ??? Smoking status: Heavy Tobacco Smoker     Packs/day: 1.00     Years: 50.00     Pack years: 50.00     Types: Cigarettes     Start date: 06/16/1969   ??? Smokeless tobacco: Former Neurosurgeon     Types: Chew     Quit date: 2009   ??? Tobacco comment: REfused tobacco cessation referral   Substance and Sexual Activity   ??? Alcohol use: No     Alcohol/week: 0.0 standard drinks   ??? Drug use: Not Currently     Types: Cocaine, IV     Comment: History of cocaine use years ago (intranasal, IV and crack), marijuana use.    ??? Sexual activity: Yes     Partners: Female   Lifestyle   ??? Physical activity     Days per week: Not on file     Minutes per session: Not on file   ??? Stress: Not on file   Relationships   ??? Social Wellsite geologist on phone: Not on file     Gets together: Not on file     Attends religious service: Not on file     Active member of club or organization: Not on file     Attends meetings of clubs or organizations: Not on file     Relationship status: Not on file   Other Topics Concern   ??? Not on file   Social History Narrative   ??? Not on file     Housing/Utilities   ??? Within the past 12 months, have you ever stayed: outside, in a car, in a tent, in an overnight shelter, or temporarily in someone else's home (i.e. couch-surfing)? No    ??? Are you worried about losing your housing? No    ??? Within the past 12 months, have you been unable to get utilities (heat, electricity) when it was really needed? No      Literacy   ??? How often do you need to have someone help you when you read instructions, pamphlets, or other written material from your doctor or pharmacy? Never  Discharge Needs Assessment  Concerns to be Addressed: no discharge needs identified, denies needs/concerns at this time    Clinical Risk Factors: Principal Diagnosis: Cancer, Stroke, COPD, Heart Failure, AMI, Pneumonia, Joint Replacment    Barriers to taking medications: No    Prior overnight hospital stay or ED visit in last 90 days: No    Readmission Within the Last 30 Days: no previous admission in last 30 days              Equipment Needed After Discharge: none    Discharge Facility/Level of Care Needs: other (see comments)(home with HH likely)    Readmission  Risk of Unplanned Readmission Score:  %  Predictive Model Details   No score data available for [facility] Risk of Unplanned Readmission     Readmitted Within the Last 30 Days? (No if blank)   Patient at risk for readmission?: Yes    Discharge Plan  Screen findings are: Care Manager reviewed the plan of the patient's care with the Multidisciplinary Team. No discharge planning needs identified at this time. Care Manager will continue to manage plan and monitor patient's progress with the team.    Expected Discharge Date:     Expected Transfer from Critical Care: (n/a)    Patient and/or family were provided with choice of facilities / services that are available and appropriate to meet post hospital care needs?: N/A       Initial Assessment complete?: Yes  Adline Potter  April 07, 2019 1:23 PM

## 2019-04-07 NOTE — Unmapped (Signed)
[facility]  Emergency Department Progress Note    April 07, 2019 7:26 AM         Recent VS:  Recent Patient Data       04/07/2019  0426 04/07/2019  0430 04/07/2019  0500 04/07/2019  0600    BP:  120/46  ???  119/55  94/49    Temp:  ???  37.7 ??C (99.9 ??F)  ???  ???    SpO2:  96 %  ???  96 %  96 %        Harold Richards is a 63 y.o. male with PMH of cirrhosis, hepatitis C, substance abuse, prostate cancer with implantable seeds, and COPD who presented to the ED this morning with fever (Tmax 101F). On exam, the patient was chronically ill-appearing but nontoxic. LCTAB. Abdomen was soft and nontender. CBC notable for WBC elevated to 21.2. COVID-19 was negative. Lactate elevated to 2.6. CXR indicated increased hazy and patchy opacification of the RUL, consistent with pneumonia. Pending UA and patient reevaluation after receiving IVF and antibiotics.     8:13 AM  Patient reassessed at this time.  Lactate improved with fluids, UA not consistent with infection.  Ambulatory pulse ox 95% on room air.  Patient reports feeling about the same.  Discussion held with patient and family at bedside regarding admission versus discharge.  Given patient's comorbidities, will admit for further management.  ??   Additional Medical Decision Making   ??  I have reviewed the patient's vital signs and the nursing notes. Any pertinent labs & imaging results which were available during my care of the patient were reviewed by me.     I independently visualized the radiology images.   I discussed the case and plan for continuity of care with the admitting provider.     Portions of this record have been created using Scientist, clinical (histocompatibility and immunogenetics). Dictation errors have been sought, but may not have been identified and corrected.    XR Chest Portable   Final Result      Increased hazy and patchy opacification of the right upper lung zone which likely represents  infection. Recommend repeat chest radiograph in 6-8 weeks to ensure clearing. Labs Reviewed   COMPREHENSIVE METABOLIC PANEL - Abnormal; Notable for the following components:       Result Value    Chloride 113 (*)     CO2 18.0 (*)     All other components within normal limits   LACTATE, VENOUS, WHOLE BLOOD - Abnormal; Notable for the following components:    Lactate, Venous 2.6 (*)     All other components within normal limits   URINALYSIS WITH CULTURE REFLEX - Abnormal; Notable for the following components:    Bacteria, UA Occasional (*)     Granular Casts, UA <1 (*)     Mucus, UA Rare (*)     All other components within normal limits   CBC W/ AUTO DIFF - Abnormal; Notable for the following components:    WBC 21.2 (*)     RBC 3.78 (*)     HGB 13.4 (*)     MCV 108.7 (*)     MCH 35.6 (*)     Neutrophil Left Shift 1+ (*)     Absolute Neutrophils 19.3 (*)     Absolute Lymphocytes 0.4 (*)     Absolute Monocytes 1.2 (*)     Macrocytosis Marked (*)     All other components within normal limits  Narrative:     Please use the Absolute Differential for reference ranges.    SLIDE REVIEW - Abnormal; Notable for the following components:    Smear Review Comments See Comment (*)     All other components within normal limits   COVID-19 PCR - Normal    Narrative:     --  This test was performed using the Cepheid Xpert Xpress SARS-CoV-2 assay which has been validated by the CLIA-certified, CAP-inspected [facility] and [facility]. FDA has granted Emergency Use Authorization for this test.   This real-time RT-PCR test detects SARS-CoV-2 by targeting the N2 and E genes. Negative results do not preclude SARS-CoV-2 infection and should not be used as the sole basis for patient management decisions. Negative results must be combined with clinical observations, patient history, and epidemiological information.   Information for providers and patients can be found here: [email or web address]   RAPID STREP SCREEN - Normal   TROPONIN I - Normal   LACTATE, VENOUS, WHOLE BLOOD - Normal   URINE CULTURE   CBC W/ DIFFERENTIAL    Narrative:     The following orders were created for panel order CBC w/ Differential.  Procedure                               Abnormality         Status                     ---------                               -----------         ------                     CBC w/ [id number]]         Abnormal            Final result               Morphology Review[[id number]]           Abnormal            Final result                 Please view results for these tests on the individual orders.       ??   ED Clinical Impression       Final diagnoses:   Pneumonia of right upper lobe due to infectious organism (Primary)     Documentation assistance was provided by Beacher May, Scribe, on April 07, 2019 at 7:26 AM for Audrea Muscat, MD.     Documentation assistance was provided by the scribe in my presence.  The documentation recorded by the scribe has been reviewed by me and accurately reflects the services I personally performed.    Audrea Muscat

## 2019-04-07 NOTE — Unmapped (Signed)
AMB PULSE OX complete, pt maintained between 95 and 96% for the duration. Vitals updated. Pt denies any other needs at this time. Directed to call if any needs arise.

## 2019-04-07 NOTE — Unmapped (Signed)
Pt reports x1day of headache, fever (tmax 101), cough and chills. Denies covid contacts. Reports family member has strep throat.

## 2019-04-07 NOTE — Unmapped (Signed)
PHYSICAL THERAPY  Evaluation (04/07/19 1410)     Patient Name:  Harold Richards       Medical Record Number: [id number]   Date of Birth: May 20, 1956  Sex: Male                 ASSESSMENT        Assessment : Pt is a 63 yo M admitted with HA, fever, cough, and chills. Covid test negative. Pt is independent with all mobility and has no further skilled acute care PT needs. D/C PT 04/07/19.          Personal Factors/Comorbidities Present: 3   Specific Comorbidities : Hep C, prostate Cancer, COPD   Examination of Body System: 1-3 elements   Body System: musculoskeletal, cardiopulmonary   Clinical Decision Making: Low     PLAN  Planned Frequency of Treatment:  D/C Services for: D/C Services      Planned Interventions:      Post-Discharge Physical Therapy Recommendations:  PT services not indicated    PT DME Recommendations: None           Goals:   Patient and Family Goals: none stated                                                                                                        Prognosis:  Excellent  Positive Indicators: age, PLOF, family support, motivation.  Barriers to Discharge: None    SUBJECTIVE  Patient reports: Pt agreeable to therapy. Wife present.  Current Functional Status: Pt in bed upon PT arrival.     Prior Functional Status: Pt was independent.  Equipment available at home: Levan Hurst, [place]     Past Medical History:   Diagnosis Date   ??? Asthma    ??? Chronic headaches    ??? Colon polyp    ??? Enlarged prostate    ??? GERD (gastroesophageal reflux disease) 02/08/2013   ??? Hepatitis C    ??? Hx of substance abuse (CMS-HCC)     hx of recreational drug use (cocaine IV, intranasal and crack),    ??? Infectious viral hepatitis    ??? Lung nodule seen on imaging study 07/12/2016   ??? Osteoporosis    ??? Other emphysema (CMS-HCC)    ??? Renal calculus    ??? Skin cancer    ??? Tobacco abuse 02/08/2013    Social History     Tobacco Use   ??? Smoking status: Heavy Tobacco Smoker     Packs/day: 1.00     Years: 50.00     Pack years: 50.00     Types: Cigarettes     Start date: 06/16/1969   ??? Smokeless tobacco: Former Neurosurgeon     Types: Chew     Quit date: 2009   ??? Tobacco comment: REfused tobacco cessation referral   Substance Use Topics   ??? Alcohol use: No     Alcohol/week: 0.0 standard drinks      Past Surgical History:   Procedure Laterality Date   ??? CERVICAL FUSION  cage   ??? PR COLSC FLX W/RMVL OF TUMOR POLYP LESION SNARE TQ N/A 10/20/2015    Procedure: COLONOSCOPY FLEX; W/REMOV TUMOR/LES BY SNARE;  Surgeon: Annie Paras, MD;  [facility];  Service: Gastroenterology   ??? PR EXCISION SUBMAXILLARY GLAND Right 03/31/2015    Procedure: EXC SUBMANDIBULAR GLAND;  Surgeon: Lauralee Evener, MD;  Location: ASC [facility];  Service: ENT   ??? PR UPPER GI ENDOSCOPY,DIAGNOSIS N/A 08/18/2017    Procedure: UGI ENDO, INCLUDE ESOPHAGUS, STOMACH, & DUODENUM &/OR JEJUNUM; DX W/WO COLLECTION SPECIMN, BY BRUSH OR WASH;  Surgeon: Rona Ravens, MD;  [facility]: [facility];  Service: Gastroenterology    Family History   Problem Relation Age of Onset   ??? Heart attack Father    ??? Hypertension Father    ??? Lung cancer Mother    ??? Bone cancer Mother    ??? Hypertension Mother    ??? Hepatitis Sister         Allergies: Bicalutamide and Penicillins                Objective Findings  Precautions / Restrictions  Precautions: Non-applicable  Weight Bearing Status: Non-applicable  Required Braces or Orthoses: Non-applicable        Pain Comments: reporting headache 2/10.  Medical Tests / Procedures: (-) COVID  Equipment / Environment: Vascular access (PIV, TLC, Port-a-cath, PICC)    At Rest: VSS  With Activity: VSS          Living Situation  Living Environment: House  Lives With: Spouse  Home Living: One level home, Stairs to enter with rails  Rail placement (outside): Bilateral rails  Number of Stairs: 3     Cognition: WFL     Skin Inspection: intact    UE ROM: WFL  UE Strength: WFL  LE ROM: WFL  LE Strength: WFL Sensation: intact            Bed Mobility: Supine to sit independently.  Transfers: Sit to stand independently.   Gait  Level of Assistance: Independent  Assistive Device: None  Distance Ambulated (ft): 50 ft  Gait: Pt ambulated around room. No LOB with directional or head turns.  Stairs: Stair simulation x3 reps independently.           Physical Therapy Session Duration  PT Individual - Duration: 14    Medical Staff Made Aware: RN Joni Reining    I attest that I have reviewed the above information.  Signed: Janetta Hora, PT  Filed 04/07/2019

## 2019-04-07 NOTE — Unmapped (Signed)
[facility]  Emergency Department Provider Note          ED Clinical Impression      Final diagnoses:   Pneumonia of right upper lobe due to infectious organism (Primary)           Impression, ED Course, Assessment and Plan      Impression: Harold Richards is a 63 y.o. male with a past medical history of cirrhosis, hep C, previous history of substance abuse, prostate cancer with implantable seeds, COPD who presents with fever up to 101.  On exam, patient appears chronically ill but nontoxic.  Vitals are reassuring.  Lungs are clear to auscultation bilaterally.  Abdomen is soft and nontender.  Differential includes COVID versus UTI versus strep, also considered meningitis although this is less likely with no meningismus.  Will check basic labs, COVID swab, strep swab, UA, chest x-ray.  IV fluids started and will reevaluate.    7:12 AM  X-ray shows evidence of right upper lobe pneumonia.  Code swab was negative.  Labs are remarkable for leukocytosis and elevated lactate.  After IV fluids, lactate is downtrending.  Antibiotics ordered and given.  Patient signed out to oncoming attending pending reevaluation after completion of antibiotics.     Additional Medical Decision Making     I have reviewed the vital signs and the nursing notes. Labs and radiology results that were available during my care of the patient were independently reviewed by me and considered in my medical decision making.   I independently visualized the radiology images.       Portions of this record have been created using Scientist, clinical (histocompatibility and immunogenetics). Dictation errors have been sought, but may not have been identified and corrected.  ____________________________________________         History        Chief Complaint  Cough      HPI   Harold Richards is a 63 y.o. male with a past medical history of cirrhosis, hep C, previous history of substance abuse, prostate cancer with seeds, COPD who presents with fever.  Patient was in his usual state of health until yesterday at approximately 5 PM.  At that point time, developed myalgias, worse in the neck and the bilateral lower extremities.  Reports temp up to 101 at home.  Reports mild sore throat, headache, neck pain and muscle pain.  Also reports fatigue.  Reports a sick contact that is positive with strep throat.  Otherwise, denies chest pain, shortness of breath, cough, abdominal discomfort.    Past Medical History:   Diagnosis Date   ??? Asthma    ??? Chronic headaches    ??? Colon polyp    ??? Enlarged prostate    ??? GERD (gastroesophageal reflux disease) 02/08/2013   ??? Hepatitis C    ??? Hx of substance abuse (CMS-HCC)     hx of recreational drug use (cocaine IV, intranasal and crack),    ??? Infectious viral hepatitis    ??? Lung nodule seen on imaging study 07/12/2016   ??? Osteoporosis    ??? Other emphysema (CMS-HCC)    ??? Renal calculus    ??? Skin cancer    ??? Tobacco abuse 02/08/2013       Patient Active Problem List   Diagnosis   ??? Chronic headaches   ??? Renal calculus   ??? Tobacco abuse   ??? Nasal septum perforation   ??? Insomnia   ??? Other emphysema (CMS-HCC)   ???  Hepatitis C   ??? Osteoporosis of lumbar spine   ??? Proteinuria   ??? Prostate cancer (CMS-HCC)   ??? Cirrhosis (CMS-HCC)   ??? Hepatic encephalopathy (CMS-HCC)       Past Surgical History:   Procedure Laterality Date   ??? CERVICAL FUSION      cage   ??? PR COLSC FLX W/RMVL OF TUMOR POLYP LESION SNARE TQ N/A 10/20/2015    Procedure: COLONOSCOPY FLEX; W/REMOV TUMOR/LES BY SNARE;  Surgeon: Annie Paras, MD;  [facility]: [facility];  Service: Gastroenterology   ??? PR EXCISION SUBMAXILLARY GLAND Right 03/31/2015    Procedure: EXC SUBMANDIBULAR GLAND;  Surgeon: Lauralee Evener, MD;  Location: ASC OR [facility];  Service: ENT   ??? PR UPPER GI ENDOSCOPY,DIAGNOSIS N/A 08/18/2017    Procedure: UGI ENDO, INCLUDE ESOPHAGUS, STOMACH, & DUODENUM &/OR JEJUNUM; DX W/WO COLLECTION SPECIMN, BY BRUSH OR WASH;  Surgeon: Rona Ravens, MD;  Location: [facility];  Service: Gastroenterology       No current facility-administered medications for this encounter.     Current Outpatient Medications:   ???  albuterol HFA 90 mcg/actuation inhaler, Inhale 2 puffs every four (4) hours as needed for wheezing., Disp: 3 Inhaler, Rfl: 3  ???  aspirin (ECOTRIN) 81 MG tablet, Take 81 mg by mouth daily., Disp: , Rfl:   ???  atorvastatin (LIPITOR) 40 MG tablet, TAKE 1/2 TABLET(20 MG) BY MOUTH EVERY NIGHT FOR CHOLESTEROL, Disp: 45 tablet, Rfl: 0  ???  cholecalciferol, vitamin D3, (VITAMIN D3) 1,000 unit capsule, Take 1,000 Units by mouth daily., Disp: , Rfl:   ???  cyanocobalamin (VITAMIN B-12) 250 MCG tablet, Take 250 mcg by mouth daily., Disp: , Rfl:   ???  ferrous sulfate 325 mg (65 mg iron) CpER, Take 1 tablet by mouth daily. , Disp: , Rfl:   ???  fluticasone propion-salmeterol (ADVAIR DISKUS) 250-50 mcg/dose diskus, Inhale 1 puff Two (2) times a day., Disp: 3 each, Rfl: 3  ???  lactulose (CHRONULAC) 10 gram/15 mL solution, TK 30 ML PO D, Disp: , Rfl:   ???  loratadine (CLARITIN) 10 mg tablet, Take 10 mg by mouth daily., Disp: , Rfl:   ???  methylPREDNISolone (MEDROL DOSEPACK) 4 mg tablet, follow package directions. Dispense: 1 pack (Patient not taking: Reported on 03/14/2019), Disp: 1 tablet, Rfl: 0  ???  multivitamin (TAB-A-VITE/THERAGRAN) per tablet, Take 1 tablet by mouth daily., Disp: , Rfl:   ???  rifAXIMin (XIFAXAN) 550 mg Tab, Take 1 tablet (550 mg total) by mouth Two (2) times a day., Disp: 60 tablet, Rfl: 6  ???  SUBOXONE 8-2 mg sublingual film, DIS 1 FILM UNT BID, Disp: , Rfl: 0  ???  tamsulosin (FLOMAX) 0.4 mg capsule, Take 1 capsule (0.4 mg total) by mouth daily. (Patient taking differently: Take 0.4 mg by mouth two (2) times a day. ), Disp: 90 capsule, Rfl: 3  ???  vit C,E-Zn-coppr-lutein-zeaxan (PRESERVISION AREDS 2) 250-200-40-1 mg-unit-mg-mg cap, Take 1 tablet by mouth Two (2) times a day., Disp: , Rfl:   ???  vitamin A 16109 UNIT capsule, Take 10,000 Units by mouth daily., Disp: , Rfl:     Allergies  Bicalutamide and Penicillins    Family History   Problem Relation Age of Onset   ??? Heart attack Father    ??? Hypertension Father    ??? Lung cancer Mother    ??? Bone cancer Mother    ??? Hypertension Mother    ??? Hepatitis Sister  Social History  Social History     Tobacco Use   ??? Smoking status: Heavy Tobacco Smoker     Packs/day: 1.00     Years: 50.00     Pack years: 50.00     Types: Cigarettes     Start date: 06/16/1969   ??? Smokeless tobacco: Former Neurosurgeon     Types: Chew     Quit date: 2009   ??? Tobacco comment: REfused tobacco cessation referral   Substance Use Topics   ??? Alcohol use: No     Alcohol/week: 0.0 standard drinks   ??? Drug use: Not Currently     Types: Cocaine, IV     Comment: History of cocaine use years ago (intranasal, IV and crack), marijuana use.        Review of Systems  Constitutional: Positive for fever.  Eyes: Negative for visual changes.  ENT: Negative for sore throat.  Cardiovascular: Negative for chest pain.  Respiratory: Negative for shortness of breath.  Gastrointestinal: Negative for abdominal pain, vomiting or diarrhea.  Genitourinary: Negative for dysuria.   Musculoskeletal: Negative for back pain.  Positive for myalgias.  Skin: Negative for rash.  Neurological: Positive for headache, neck pain.       Physical Exam     This provider entered the patient's room: Yes:    ? If this provider did not enter the room, a comprehensive physical exam was not able to be performed due to increased infection risk to themselves, other providers, staff and other patients), as well as to conserve personal protective equipment (PPE) utilization during the COVID-19 pandemic.    ? If this provider did enter the patient room, the following was PPE worn: N95, eye protection, gown and gloves    ED Triage Vitals   Enc Vitals Group      BP 04/07/19 0426 120/46      Pulse --       SpO2 Pulse 04/07/19 0426 85      Resp --       Temp 04/07/19 0430 37.7 ??C (99.9 ??F)      Temp Source 04/07/19 0430 Oral      SpO2 04/07/19 0426 96 %      Weight 04/07/19 0428 49.9 kg (110 lb)      Height 04/07/19 0428 1.676 m (5' 6)      Head Circumference --       Peak Flow --       Pain Score --       Pain Loc --       Pain Edu? --       Excl. in GC? --        Constitutional: Alert and oriented.  Nontoxic, appears chronically ill.  Eyes: Conjunctivae are normal.  ENT       Head: Normocephalic and atraumatic.       Nose: No congestion.       Mouth/Throat: Mucous membranes are moist.  No pharyngeal erythema or tonsillar swelling or exudate.       Neck: No stridor.  Hematological/Lymphatic/Immunilogical: No cervical lymphadenopathy.  Cardiovascular: Normal rate, regular rhythm. Normal and symmetric distal pulses are present in all extremities.  Respiratory: Normal respiratory effort. Breath sounds are normal.  Gastrointestinal: Soft and nontender. There is no CVA tenderness.  Musculoskeletal: Normal range of motion in all extremities.       Right lower leg: No tenderness or edema.       Left lower leg: No tenderness or edema.  Neurologic: Normal speech and language. No gross focal neurologic deficits are appreciated.  No meningismus.  Skin: Skin is warm, dry and intact. No rash noted.  Psychiatric: Mood and affect are normal. Speech and behavior are normal.     Radiology     Xr Chest Portable    Result Date: 04/07/2019  EXAM: XR CHEST PORTABLE DATE: 04/07/2019 5:14 AM ACCESSION: [id number] UN DICTATED: 04/07/2019 5:26 AM INTERPRETATION LOCATION: [facility] CLINICAL INDICATION: 63 years old Male with COUGH  COMPARISON: Chest radiograph dated 01/20/2015 TECHNIQUE: Portable Chest Radiograph. FINDINGS: Increased opacification over the right upper lung zone. No pleural effusion or pneumothorax. Stable cardiomediastinal silhouette.     Increased hazy and patchy opacification of the right upper lung zone which likely represents  infection. Recommend repeat chest radiograph in 6-8 weeks to ensure clearing.               Sherryl Barters, MD  04/07/19 [phone]

## 2019-04-08 LAB — CBC
HEMATOCRIT: 37.4 % — ABNORMAL LOW (ref 41.0–53.0)
HEMOGLOBIN: 12.2 g/dL — ABNORMAL LOW (ref 13.5–17.5)
MEAN CORPUSCULAR HEMOGLOBIN CONC: 32.5 g/dL (ref 31.0–37.0)
MEAN CORPUSCULAR VOLUME: 110.4 fL — ABNORMAL HIGH (ref 80.0–100.0)
MEAN PLATELET VOLUME: 6.9 fL — ABNORMAL LOW (ref 7.0–10.0)
PLATELET COUNT: 144 10*9/L — ABNORMAL LOW (ref 150–440)
RED CELL DISTRIBUTION WIDTH: 13 % (ref 12.0–15.0)
WBC ADJUSTED: 15.9 10*9/L — ABNORMAL HIGH (ref 4.5–11.0)

## 2019-04-08 LAB — BASIC METABOLIC PANEL
ANION GAP: 2 mmol/L — ABNORMAL LOW (ref 7–15)
BLOOD UREA NITROGEN: 11 mg/dL (ref 7–21)
BUN / CREAT RATIO: 14
CALCIUM: 8.5 mg/dL (ref 8.5–10.2)
CHLORIDE: 115 mmol/L — ABNORMAL HIGH (ref 98–107)
CO2: 23 mmol/L (ref 22.0–30.0)
CREATININE: 0.77 mg/dL (ref 0.70–1.30)
EGFR CKD-EPI AA MALE: >90 mL/min/{1.73_m2} (ref >=60)
EGFR CKD-EPI NON-AA MALE: >90 mL/min/{1.73_m2} (ref >=60)
GLUCOSE RANDOM: 131 mg/dL (ref 70–179)
SODIUM: 140 mmol/L (ref 135–145)

## 2019-04-08 LAB — CALCIUM: Calcium:MCnc:Pt:Ser/Plas:Qn:: 8.5

## 2019-04-08 LAB — WBC ADJUSTED: Leukocytes:NCnc:Pt:Bld:Qn:: 15.9 — ABNORMAL HIGH

## 2019-04-08 MED ORDER — TAMSULOSIN 0.4 MG CAPSULE
ORAL_CAPSULE | Freq: Two times a day (BID) | ORAL | 3 refills | 45.00000 days | Status: CP
Start: 2019-04-08 — End: 2020-04-07

## 2019-04-08 MED ORDER — NICOTINE (POLACRILEX) 2 MG BUCCAL LOZENGE
BUCCAL | 0 refills | 5 days | Status: CP | PRN
Start: 2019-04-08 — End: 2019-05-08

## 2019-04-08 MED ORDER — NICOTINE (POLACRILEX) 4 MG GUM
BUCCAL | 2 refills | 5 days | Status: CP | PRN
Start: 2019-04-08 — End: 2020-04-07

## 2019-04-08 MED ORDER — LEVOFLOXACIN 750 MG TABLET
ORAL | 0 refills | 4 days | Status: CP
Start: 2019-04-08 — End: ?

## 2019-04-08 NOTE — Unmapped (Signed)
Family Medicine Inpatient Service    History and Physical Note    Team: Family Medicine [place] [facility] (pgr [phone])    PCP: Jacquelin Hawking, MD  Date of Admission: April 07, 2019  Code Status: full code  Emergency Contact: Extended Emergency Contact Information  Primary Emergency Contact: Tat,Joanna  Address: [address]           [place], [place] [address] [place]  Home Phone: [phone]  Relation: Spouse      ASSESSMENT / PLAN:   Harold Richards is a 63 y.o. male with a past medical history of cirrhosis, hep C, previous history of substance abuse, prostate cancer with implantable seeds, COPD who presents with fever up to 101 and shortness of breath.    #Acute acquired pneumonia:  Patient presented with worsening shortness of breath and cough in the setting of a fever to 101.  X-ray with right upper lobe consolidation.  Leukocytosis 21.  Initial elevation of lactate.  However, well-appearing on room air.  Given his comorbidities the patient does have a pneumonia severity index of 110 points indicating hospitalization based on his risk.  He is status post ceftriaxone azithromycin in the ED.  -Continue antibiotics with cefdinir 300 twice daily for 5 days.  We will continue a azithromycin 500 mg daily for 3 days.  -Incentive spirometry  -Daily CBC and BMP  -Patient received Pneumovax in 2015  -Suspect patient will be able to discharge on 8/31 followed out of observation.    Chronic conditions:  #Cirrhosis from hepatitis C:  Follows with outpatient provider and receives MRI every 3 months.  Patient is on lactulose and rifaximin at home reports good compliance to medications.  No concerns for hepatic encephalopathy at this time.  No concern for ascites on exam.  Patient completed hepatitis C treatment in 2015.  Has cleared the infection.  -Continue home rifaximin and lactulose    # Chronic pain  - history of substance use:  Patient reports addition to Vicodin following neck surgery.  He has been transitioned to Suboxone and reports good compliance to the Suboxone.  He reports his pain is controlled with Suboxone  -Continue home Suboxone    # COPD:   No concern for COPD exacerbation on exam.  No wheezing.  Will continue home inhalers.    Prostate cancer: Continue home Flomax    # Tobacco use disorder: Patient currently smoking 1 PPD. Will place inpatient consult to tobacco cessation for toxic effects of tobacco with pneumonia and COPD and possible nicotine withdrawal.  - Tobacco cessation consult  - Nicotine replacement therapy with patch    # FEN/GI:  - IVF None  - Check electrolytes as indicated, replete as needed.  - Diet Regular    # PPX:   - DVT: SQ Lovenox    # Dispo: Floor  [ ]  Anticipated Discharge Location: Home  [ ]  PT/OT/DME: No needs anticipated  [ ]  CM/SW needs: None anticipated  [ ]  Meds/Rx:  Not yet prescribed. No special med needs  [ ]  Teaching: None anticipated  [ ]  Follow up appt: Appointment needed  [ ]  Excuse letter: Needed  [ ]  Transport: Private Needed    HISTORY OF PRESENT ILLNESS:  Harold Richards is a 63 y.o. male with a past medical history of cirrhosis, hep C, previous history of substance abuse, prostate cancer with implantable seeds, COPD who presents with fever up to 101 and shortness of breath.    Patient presents for  evaluation of myalgias, shortness of breath, cough and fevers.  The patient reports that his neck has been sore the last several days and had some URI symptoms.  His symptoms worsened yesterday around noon when he developed a fever of 101.  He reports cough and shortness of breath since worsening since that as well.  He is a smoker and smokes 1 pack/day.  He does have a history of COPD.  No known history of pneumonia.      ED Course: Patient was found to have a initial elevated lactate that improved with fluids.  He started on ceftriaxone and azithromycin after chest x-ray was consistent with a right upper lobe pneumonia.  COVID swab is negative.  Leukocytosis of 21.  Afebrile in the ED.    COVID-19 Universal Testing on Admission (if asymptomatic, does not need repeat if done with the past 7 days): Symptomatic & Negative    PAST MEDICAL / SURGICAL HX:  Past Medical History:   Diagnosis Date   ??? Asthma    ??? Chronic headaches    ??? Colon polyp    ??? Enlarged prostate    ??? GERD (gastroesophageal reflux disease) 02/08/2013   ??? Hepatitis C    ??? Hx of substance abuse (CMS-HCC)     hx of recreational drug use (cocaine IV, intranasal and crack),    ??? Infectious viral hepatitis    ??? Lung nodule seen on imaging study 07/12/2016   ??? Osteoporosis    ??? Other emphysema (CMS-HCC)    ??? Renal calculus    ??? Skin cancer    ??? Tobacco abuse 02/08/2013     Past Surgical History:   Procedure Laterality Date   ??? CERVICAL FUSION      cage   ??? PR COLSC FLX W/RMVL OF TUMOR POLYP LESION SNARE TQ N/A 10/20/2015    Procedure: COLONOSCOPY FLEX; W/REMOV TUMOR/LES BY SNARE;  Surgeon: Annie Paras, MD;  [facility];  Service: Gastroenterology   ??? PR EXCISION SUBMAXILLARY GLAND Right 03/31/2015    Procedure: EXC SUBMANDIBULAR GLAND;  Surgeon: Lauralee Evener, MD;  Location: ASC OR [facility];  Service: ENT   ??? PR UPPER GI ENDOSCOPY,DIAGNOSIS N/A 08/18/2017    Procedure: UGI ENDO, INCLUDE ESOPHAGUS, STOMACH, & DUODENUM &/OR JEJUNUM; DX W/WO COLLECTION SPECIMN, BY BRUSH OR WASH;  Surgeon: Rona Ravens, MD;  Location: [facility];  Service: Gastroenterology       FAMILY HX:  Family History   Problem Relation Age of Onset   ??? Heart attack Father    ??? Hypertension Father    ??? Lung cancer Mother    ??? Bone cancer Mother    ??? Hypertension Mother    ??? Hepatitis Sister        SOCIAL HX:   Social History     Socioeconomic History   ??? Marital status: Married     Spouse name: None   ??? Number of children: None   ??? Years of education: None   ??? Highest education level: None   Occupational History   ??? None   Social Needs   ??? Financial resource strain: Not hard at all   ??? Food insecurity     Worry: Patient refused     Inability: Patient refused   ??? Transportation needs     Medical: None     Non-medical: None   Tobacco Use   ??? Smoking status: Heavy Tobacco Smoker     Packs/day:  1.00     Years: 50.00     Pack years: 50.00     Types: Cigarettes     Start date: 06/16/1969   ??? Smokeless tobacco: Former Neurosurgeon     Types: Chew     Quit date: 2009   ??? Tobacco comment: REfused tobacco cessation referral   Substance and Sexual Activity   ??? Alcohol use: No     Alcohol/week: 0.0 standard drinks   ??? Drug use: Not Currently     Types: Cocaine, IV     Comment: History of cocaine use years ago (intranasal, IV and crack), marijuana use.    ??? Sexual activity: Yes     Partners: Female   Lifestyle   ??? Physical activity     Days per week: None     Minutes per session: None   ??? Stress: None   Relationships   ??? Social Wellsite geologist on phone: None     Gets together: None     Attends religious service: None     Active member of club or organization: None     Attends meetings of clubs or organizations: None     Relationship status: None   Other Topics Concern   ??? None   Social History Narrative   ??? None       MEDICATIONS / ALLERGIES:  Medications Prior to Admission   Medication Sig Dispense Refill Last Dose   ??? albuterol HFA 90 mcg/actuation inhaler Inhale 2 puffs every four (4) hours as needed for wheezing. 3 Inhaler 3    ??? aspirin (ECOTRIN) 81 MG tablet Take 81 mg by mouth daily.      ??? atorvastatin (LIPITOR) 40 MG tablet TAKE 1/2 TABLET(20 MG) BY MOUTH EVERY NIGHT FOR CHOLESTEROL 45 tablet 0    ??? cholecalciferol, vitamin D3, (VITAMIN D3) 1,000 unit capsule Take 1,000 Units by mouth daily.      ??? cyanocobalamin (VITAMIN B-12) 250 MCG tablet Take 250 mcg by mouth daily.      ??? ferrous sulfate 325 mg (65 mg iron) CpER Take 1 tablet by mouth daily.       ??? fluticasone propion-salmeterol (ADVAIR DISKUS) 250-50 mcg/dose diskus Inhale 1 puff Two (2) times a day. 3 each 3    ??? [EXPIRED] guaiFENesin (MUCINEX) 600 mg 12 hr tablet Take 1 tablet (600 mg total) by mouth Two (2) times a day for 14 days. 28 tablet 0    ??? lactulose (CHRONULAC) 10 gram/15 mL solution TK 30 ML PO D      ??? loratadine (CLARITIN) 10 mg tablet Take 10 mg by mouth daily.      ??? methylPREDNISolone (MEDROL DOSEPACK) 4 mg tablet follow package directions. Dispense: 1 pack (Patient not taking: Reported on 03/14/2019) 1 tablet 0    ??? multivitamin (TAB-A-VITE/THERAGRAN) per tablet Take 1 tablet by mouth daily.      ??? rifAXIMin (XIFAXAN) 550 mg Tab Take 1 tablet (550 mg total) by mouth Two (2) times a day. 60 tablet 6    ??? SUBOXONE 8-2 mg sublingual film DIS 1 FILM UNT BID  0    ??? tamsulosin (FLOMAX) 0.4 mg capsule Take 1 capsule (0.4 mg total) by mouth daily. (Patient taking differently: Take 0.4 mg by mouth two (2) times a day. ) 90 capsule 3    ??? vit C,E-Zn-coppr-lutein-zeaxan (PRESERVISION AREDS 2) 250-200-40-1 mg-unit-mg-mg cap Take 1 tablet by mouth Two (2) times a day.      ???  vitamin A 16109 UNIT capsule Take 10,000 Units by mouth daily.          Allergies   Allergen Reactions   ??? Bicalutamide Rash   ??? Penicillins Rash   ??? Rocephin [Ceftriaxone] Rash     Patient received in Emergency Room and reported rash to stomach and back several hours later.        IMMUNIZATIONS:  Immunization History   Administered Date(s) Administered   ??? Hepatitis A 07/28/2016, 01/26/2017   ??? Hepatitis A Vaccine Pediatric / Adolescent 2 Dose IM 03/05/2014   ??? Influenza Vaccine Quad (IIV4 PF) 68mo+ injectable 06/02/2014, 06/16/2016   ??? Influenza Virus Vaccine, unspecified formulation 05/18/2017, 05/24/2018   ??? PNEUMOCOCCAL POLYSACCHARIDE 23 03/05/2014       REVIEW OF SYSTEMS:  Pertinent positives and negatives per HPI. A complete review of systems otherwise negative.    PHYSICAL EXAM:    Initial ED Vitals:   ED Triage Vitals   Enc Vitals Group      BP 04/07/19 0426 120/46      Heart Rate 04/07/19 0759 109      SpO2 Pulse 04/07/19 0426 85      Resp 04/07/19 0900 16      Temp 04/07/19 0430 37.7 ??C      Temp Source 04/07/19 0430 Oral      SpO2 04/07/19 0426 96 %      Weight 04/07/19 0428 49.9 kg (110 lb)      Height 04/07/19 0428 1.676 m (5' 6)      Head Circumference --       Peak Flow --       Pain Score --       Pain Loc --       Pain Edu? --       Excl. in GC? --        Recent Vitals:  Vitals:    04/07/19 1008   BP: 108/56   Pulse: 71   Resp: 18   Temp: 36 ??C   SpO2: 92%       GEN: Well-appearing, lying in bed, NAD.  Chronic ill-appearing  Eyes: PERRL. No scleral icterus. Conjunctiva non-erythematous. EOMI.  HEENT: NCAT, MMM. Oropharynx clear.  Neck: Supple.  Lymphadenopathy: No cervical or supraclavicular LAD.  CV: Regular rate and rhythm. No murmurs/rubs/gallops. No costochondral tenderness. No cyanosis or clubbing.   Pulm: CTAB. No wheezing, crackles, or rhonchi.  Abd: Flat.  Nontender. No guarding, rebound.  Normoactive bowel sounds.    Neuro: A&O x 3. No focal deficits. Strength 5/5 UE/LE. Distal sensation to light touch intact.  Ext: No peripheral edema.  Palpable distal pulses.  Skin: No rashes or skin lesions.      LABS/ STUDIES:  All imaging, laboratory studies, and other pertinent tests including electrocardiography were reviewed prior to admission and are summarized within the assessment and plan.     Bernardo Heater, DO  PGY-2 Family Medicine

## 2019-04-08 NOTE — Unmapped (Signed)
Tobacco Treatment Program  Phone Visit Note    This medical encounter was conducted virtually using [email or web address]  TeleHealth protocols.      I have identified myself to the patient and conveyed my credentials to [name]  I have explained the capabilities and limitations of telemedicine and the patient/proxy and myself both agree that it is appropriate for their current circumstances/symptoms. The patient/proxy has given me verbal consent to proceed.     Patient's location at the time of the telephone visit: Hospitalized at [facility]   Provider's location at the time of the telephone visit: At home, in [facility] Information  Person Contacted: [name] Phone number: [phone]       Phone Outcome: Talked to Pt   Is there someone else in the room? No.      Purpose of contact:     [name] is a 63 y.o. male is participating in a telephone visit for psychotherapy for tobacco use disorder. Patient consented to telephone visit because of social distancing measures in place due to the COVID-19 pandemic.     NOTE:     SUMMARY: Sw utilized Motivational Interviewing techniques to assess Pt for treatment of tobacco use. Pt described smoking 1ppd (typically smokes after meals). Pt expressed the he has been smoking for approximately 50 years and shared that he has never been tobacco-free before. Pt is undecided about tobacco cessation but expressed willingness to minimize tobacco use. SW utilized MI techniques and helped Pt identify triggers.  SW provided pscyhoeducation on 7 FDA approved cessation medications, and Pt elected to try 21mg  nicotine patch, 4mg  nicotine gum and lozenge. SW will request Rx from Pt's medical provider. We discussed behavioral strategies to assist with managing urges and triggers. Sw and Pt discussed behavioral distractions associated with engaging in repetitive or simple activities whenever urges emerge. Sw encouraged Pt to be mindful of triggers and to enact oral strategies that incorporate the use of toothpicks, drinking straws, hard candy or lollipops to assist in alleviating cravings. Sw and pt discussed reorganizing some parts of their day and incorporating use oral strategies whenever cravings emerge to promote tobacco free lifestyle and to disassociate daily activities from tobacco use. SW provided pt with information on physical improvements related to tobacco cessation, and available resources.Please see below for NRT recommendations in bold. Please send pt home with scripts for nicotine gum and lozenge at time of discharge.    Medications Recommended During Hospitalization: Patch 21mg   Outpatient/Discharge Medications Recommended: Lozenge 4mg , Gum 4mg     Tobacco Use Treatment  Program: Hospital Inpatient  Type of Visit: Initial  Tobacco Use Treatment Visit: Talked with patient  Goals Of Session: Assessment, Communication of feelings, Insight, increase, Problem-solving skill development, Risk behaviors, modulate, Mood, improve/stabilize, Relapse prevention skills training, Communication skills, improve, Stress management, increase    Tobacco Use During Past 30 Days  Time Since Last Tobacco Use: 1 to 7 days ago  Tobacco Withdrawal (Past 24 Hours): None noted  Type of Tobacco Products Used: Cigarettes  Quantity Used: 20  Quantity Per: day  Smoking Allowed in Home: Yes    Tobacco Use History  Age Began Use (Years Old): 12  Medications Used in Past Attempts: Nicotine Gum    Behavioral Assessment  Why Uses: 1. Habit (pt smokes after meals)   Barriers/Challenges: 1. long-standing habit   Strategies: 1.  Sw provided pscyhoeducation on 7 FDA approved cessation medications and guidelines for effective use of medication 2 Sw and Pt discussed behavioral distractions associated with engaging in repetitive or simple activities whenever urges emerge 3. Sw encouraged Pt to be mindful of triggers and to enact oral strategies that incorporate the use of toothpicks, drinking straws, hard candy or lollipops to assist in alleviating cravings 4. Sw and pt discussed reorganizing some parts of their day and incorporating use oral strategies whenever cravings emerge to promote tobacco free lifestyle and to disassociate daily activities from tobacco use     Treatment Plan  Cessation Meds Currently Using: Patch 21mg   Medications Recommended During Hospitalization: Patch 21mg   Outpatient/Discharge Medications Recommended: Lozenge 4mg , Gum 4mg   Plan to Obtain Outpatient Meds: TTS messaged providers for Rx  Patient's Plan Post Discharge/Visit: Reduce tobacco use    TTS Information  Diagnosis: Tobacco use disorder, unspecified, uncomplicated (F17.200)  Interventions: Assessed, Behavioral techniques, Discussed, Informed, Motivational interviewing, Suggested, Taught, Encouraged, Supportive therapy, Psycho-education, Tx plan development  TTS Visit Length: Psychotherapy >16 min     As part of this Telephone Visit, no in-person exam was conducted.    I spent 17 minutes on the telephone with the patient.   I spent an additional 10 minutes on pre- and post-visit activities (including charting, communication with medical team, and coordination of aftercare).      Visit Format/Coding: Telephone Call (Audio Only): Behavioral Health/Non-Physicians:  In charge capture section use [id number] (21-30 minutes)    Donita Brooks, MSW, LCSW, NCTTP  Clinical Social Worker/ Tobacco Treatment Specialist  Inpatient Tobacco Treatment Program   Phone: [phone]  Pager: [phone]

## 2019-04-08 NOTE — Unmapped (Signed)
Physician Discharge Summary HBR  2 DT HBR  [address]  [place] [address]  Dept: [phone]  Loc: [phone]     Identifying Information:   Harold Richards  July 07, 1956  [id number]    Primary Care Physician: Jacquelin Hawking, MD   Code Status: Full Code    Admit Date: 04/07/2019    Discharge Date: 04/08/2019     Discharge To: Home    Discharge Service: [facility] - FAM [facility]     Discharge Attending Physician: Dr. Ralene Bathe   Discharge Diagnoses:  Principal Problem:    Community acquired pneumonia of right upper lobe of lung  Active Problems:    Chronic headaches    Tobacco use disorder    Insomnia    Other emphysema (CMS-HCC)    Hepatitis C    Prostate cancer (CMS-HCC)    Cirrhosis (CMS-HCC)    Hepatic encephalopathy (CMS-HCC)  Resolved Problems:    * No resolved hospital problems. *      Outpatient Provider Follow Up Issues:   [ ]  abx adherence   [ ]  smoking cessation     Hospital Course:   Harold Richards??is a 63 y.o.??male??with a past medical history of cirrhosis, hep C, previous history of substance abuse, prostate cancer with implantable seeds, COPD who presents with fever up to 101 and shortness of breath and was diagnosed with pneumonia.   ??  #Acute acquired pneumonia:  Patient presented with worsening shortness of breath and cough in the setting of a fever to 101.  X-ray with right upper lobe consolidation.  Leukocytosis 21.  Initial elevation of lactate.  However, well-appearing on room air.  Given his comorbidities the patient does have a pneumonia severity index of 110 points indicating hospitalization based on his risk.  He is status post ceftriaxone azithromycin in the ED. He developed a rash from the CTX and was transitioned to received Levaquin for a 5 day course of therapy. He was also discharged with smoking cessation supplies.     No changes were made to his home medications.     Procedures:  None  No admission procedures for hospital encounter. ______________________________________________________________________  Discharge Medications:     Your Medication List      STOP taking these medications    methylPREDNISolone 4 mg tablet  Commonly known as: MEDROL DOSEPACK     MUCINEX 600 mg 12 hr tablet  Generic drug: guaiFENesin        START taking these medications    levoFLOXacin 750 MG tablet  Commonly known as: LEVAQUIN  Take 1 tablet (750 mg total) by mouth daily.     nicotine polacrilex 4 MG gum  Commonly known as: NICORETTE  Apply 1 each (4 mg total) to cheek every hour as needed for smoking cessation (max 24 pieces per day).        CONTINUE taking these medications    albuterol 90 mcg/actuation inhaler  Commonly known as: VENTOLIN HFA  Inhale 2 puffs every four (4) hours as needed for wheezing.     aspirin 81 MG tablet  Commonly known as: ECOTRIN  Take 81 mg by mouth daily.     atorvastatin 40 MG tablet  Commonly known as: LIPITOR  TAKE 1/2 TABLET(20 MG) BY MOUTH EVERY NIGHT FOR CHOLESTEROL     ferrous sulfate 325 mg (65 mg iron) Cper  Take 1 tablet by mouth daily.     fluticasone propion-salmeteroL 250-50 mcg/dose diskus  Commonly known as: ADVAIR DISKUS  Inhale 1 puff Two (2) times a day.     lactulose 10 gram/15 mL solution  Commonly known as: CHRONULAC  TK 30 ML PO D     loratadine 10 mg tablet  Commonly known as: CLARITIN  Take 10 mg by mouth daily.     multivitamin per tablet  Commonly known as: TAB-A-VITE/THERAGRAN  Take 1 tablet by mouth daily.     PRESERVISION AREDS-2 250-200-40-1 mg-unit-mg-mg Cap  Generic drug: vit C,E-Zn-coppr-lutein-zeaxan  Take 1 tablet by mouth Two (2) times a day.     SUBOXONE 8-2 mg sublingual film  Generic drug: buprenorphine-naloxone  DIS 1 FILM UNT BID     tamsulosin 0.4 mg capsule  Commonly known as: FLOMAX  Take 1 capsule (0.4 mg total) by mouth two (2) times a day.     vitamin A 81191 UNIT capsule  Take 10,000 Units by mouth daily.     vitamin B-12 250 MCG tablet  Generic drug: cyanocobalamin  Take 250 mcg by mouth daily.     VITAMIN D3 25 mcg (1,000 unit) capsule  Generic drug: cholecalciferol (vitamin D3)  Take 1,000 Units by mouth daily.     XIFAXAN 550 mg Tab  Generic drug: rifAXIMin  Take 1 tablet (550 mg total) by mouth Two (2) times a day.            Allergies:  Bicalutamide, Penicillins, and Rocephin [ceftriaxone]  ______________________________________________________________________  Pending Test Results (if blank, then none):  Pending Labs     Order Current Status    Urine Culture In process          Most Recent Labs:  All lab results last 24 hours -   Recent Results (from the past 24 hour(s))   ECG 12 Lead    Collection Time: 04/07/19 12:34 PM   Result Value Ref Range    EKG Systolic BP  mmHg    EKG Diastolic BP  mmHg    EKG Ventricular Rate 76 BPM    EKG Atrial Rate 76 BPM    EKG P-R Interval 190 ms    EKG QRS Duration 134 ms    EKG Q-T Interval 414 ms    EKG QTC Calculation 465 ms    EKG Calculated P Axis 86 degrees    EKG Calculated R Axis 122 degrees    EKG Calculated T Axis 41 degrees    QTC Fredericia 447 ms   CBC    Collection Time: 04/08/19  7:02 AM   Result Value Ref Range    WBC 15.9 (H) 4.5 - 11.0 10*9/L    RBC 3.39 (L) 4.50 - 5.90 10*12/L    HGB 12.2 (L) 13.5 - 17.5 g/dL    HCT 47.8 (L) 29.5 - 53.0 %    MCV 110.4 (H) 80.0 - 100.0 fL    MCH 35.8 (H) 26.0 - 34.0 pg    MCHC 32.5 31.0 - 37.0 g/dL    RDW 62.1 30.8 - 65.7 %    MPV 6.9 (L) 7.0 - 10.0 fL    Platelet 144 (L) 150 - 440 10*9/L   Basic metabolic panel    Collection Time: 04/08/19  7:02 AM   Result Value Ref Range    Sodium 140 135 - 145 mmol/L    Potassium 3.7 3.5 - 5.0 mmol/L    Chloride 115 (H) 98 - 107 mmol/L    CO2 23.0 22.0 - 30.0 mmol/L    Anion Gap 2 (L) 7 - 15 mmol/L  BUN 11 7 - 21 mg/dL    Creatinine 1.61 0.96 - 1.30 mg/dL    BUN/Creatinine Ratio 14     EGFR CKD-EPI Non-African American, Male >90 >=60 mL/min/1.30m2    EGFR CKD-EPI African American, Male >90 >=60 mL/min/1.41m2    Glucose 131 70 - 179 mg/dL    Calcium 8.5 8.5 - 04.5 mg/dL Relevant Studies/Radiology (if blank, then none):  Xr Chest Portable    Result Date: 04/07/2019  EXAM: XR CHEST PORTABLE DATE: 04/07/2019 5:14 AM ACCESSION: [id number] UN DICTATED: 04/07/2019 5:26 AM INTERPRETATION LOCATION: [facility] CLINICAL INDICATION: 63 years old Male with COUGH  COMPARISON: Chest radiograph dated 01/20/2015 TECHNIQUE: Portable Chest Radiograph at 0504 hours FINDINGS: Increased opacification over the right upper lung zone. No pleural effusion or pneumothorax. Stable cardiomediastinal silhouette.     Increased hazy and patchy opacification of the right upper lung zone which likely represents  infection. Recommend repeat chest radiograph in 6-8 weeks to ensure clearing.    ______________________________________________________________________  Discharge Instructions:   Activity Instructions     Activity as tolerated                Other Instructions     Call MD for:  difficulty breathing, headache or visual disturbances      Call MD for:  extreme fatigue      Call MD for:  persistent dizziness or light-headedness      Call MD for:  severe uncontrolled pain      Call MD for: Temperature > 38.5 Celsius ( > 101.3 Fahrenheit)      Discharge instructions      Discharge Instructions for Pneumonia        You have been diagnosed with pneumonia, a serious lung infection. Most cases of pneumonia are caused by bacteria. Pneumonia most often occurs in older adults, young children, and people with chronic health problems.      Home Care        Take your medication exactly as directed. The Levaquin is for 4 more days. Don t skip doses. Continue taking your antibiotics as directed until they are all gone even if you start to feel better. This will prevent the pneumonia from coming back.      Drink at least 8 glasses of water daily, unless directed otherwise. This helps to loosen and thin secretions so that you can cough them up.      Use a cool-mist humidifier in your bedroom. Be sure to clean the humidifier daily.      Coughing up mucus is normal. Roe Coombs t use medications to suppress your cough unless your cough is dry, painful, or interferes with your sleep. You may use an expectorant if ordered by your doctor.      Learn percussion and postural drainage. These are techniques that can help you cough up extra mucus. This extra mucus can trap germs in your lungs. Ask your healthcare provider for instructions. Perform these techniques 3 times a day until your lungs are clear.      Warm compresses or a moist heating pad on the lowest setting can be used to relieve chest discomfort. Use several times a day for 15-20 minutes at a time. (To prevent injuring your skin, be sure the temperature of the compress or heating pad is warm, not hot.)      Get plenty of rest until your fever, shortness of breath, and chest pain go away.      Plan to get a flu shot every  year.      Ask your doctor about a pneumonia vaccination.    When to Seek Medical Attention        Call 911 right away if you have any of the following:          Chest pain unrelated to coughing          Trouble breathing          Blue lips or fingernails      Otherwise, call your doctor if you have any of the following:          Fever above 101.5 F          Bloody sputum          Vomiting         Discharge instructions to patient: Call your primary care doctor and make an appointment to see them:      Within 2 weeks from the time you are discharged from the hospital         No dressing needed                 Follow Up instructions and Outpatient Referrals     Call MD for:  difficulty breathing, headache or visual disturbances      Call MD for:  extreme fatigue      Call MD for:  persistent dizziness or light-headedness      Call MD for:  severe uncontrolled pain      Call MD for: Temperature > 38.5 Celsius ( > 101.3 Fahrenheit)      Discharge instructions            Appointments which have been scheduled for you    Apr 18, 2019  8:30 AM  (Arrive by 8:00 AM)  FOLLOW UP 15 with Peterson Lombard, MD  [facility] RADIATION ONCOLOGY [facility]) [address]  [place] [address]  [phone]      Apr 29, 2019 12:30 PM  (Arrive by 12:15 PM)  CT MAXILLOFACIAL WO CONTRAST with IC CT MBL  RAD [facility]) [address]  [place] [address]  [phone]   Let us know if pt:  Pregnant or nursing  Claustrophobic    (Title:CTWOCNTRST)     Apr 29, 2019  1:45 PM  RETURN  VOICE with Vista Deck, MD  [facility] [address] [facility]) [address]  [place] [address]  [phone]      May 28, 2019  8:00 AM  (Arrive by 7:30 AM)  RETURN VIRAL HEPATITIS with Fawn Kirk, FNP  [facility]) [address]  [place] [address]  [phone]           ______________________________________________________________________  Discharge Day Services:  BP 108/60  - Pulse 75  - Temp 35.9 ??C (Oral)  - Resp 18  - Ht 167.6 cm (5' 6)  - Wt 49.9 kg (110 lb)  - SpO2 100%  - BMI 17.75 kg/m??   Pt seen on the day of discharge and determined appropriate for discharge.    GEN: well appearing, lying in bed, NAD   HEENT: NCAT, MMM. EOMI.  Neck: Supple.  CV: Regular rate and rhythm. No murmurs/rubs/gallops.  Pulm: CTAB. No wheezing, crackles, or rhonchi.  Abd: Flat.  Nontender. No guarding, rebound.  Normoactive bowel sounds.    Neuro:  A&O x 3. No focal deficits.   Ext: No peripheral edema.  Palpable distal pulses.    Condition at Discharge: good    Length of Discharge: I spent less than 30 mins in the discharge of this patient.

## 2019-04-08 NOTE — Unmapped (Signed)
Harold Richards??is a 63 y.o.??male??with a past medical history of cirrhosis, hep C, previous history of substance abuse, prostate cancer with implantable seeds, COPD who presents with fever up to 101 and shortness of breath and was diagnosed with pneumonia.   ??  #Acute acquired pneumonia:  Patient presented with worsening shortness of breath and cough in the setting of a fever to 101.  X-ray with right upper lobe consolidation.  Leukocytosis 21.  Initial elevation of lactate.  However, well-appearing on room air.  Given his comorbidities the patient does have a pneumonia severity index of 110 points indicating hospitalization based on his risk.  He is status post ceftriaxone azithromycin in the ED. He developed a rash from the CTX and was transitioned to received Levaquin for a 5 day course of therapy. He was also discharged with smoking cessation supplies.     No changes were made to his home medications.

## 2019-04-08 NOTE — Unmapped (Signed)
Patient is alert and oriented x 4, self care. Vss, afebrile, no s/s of infection.  Tylenol given for headache. Patient was not able to get the suboxone due to the pyxis being updated for several hours. Patient has been resting during the night. Will continue with the plan of care.   Problem: Adult Inpatient Plan of Care  Goal: Plan of Care Review  Outcome: Progressing  Goal: Patient-Specific Goal (Individualization)  Outcome: Progressing  Goal: Absence of Hospital-Acquired Illness or Injury  Outcome: Progressing  Goal: Optimal Comfort and Wellbeing  Outcome: Progressing  Goal: Readiness for Transition of Care  Outcome: Progressing  Goal: Rounds/Family Conference  Outcome: Progressing     Problem: Fluid Imbalance (Pneumonia)  Goal: Fluid Balance  Outcome: Progressing     Problem: Infection (Pneumonia)  Goal: Resolution of Infection Signs/Symptoms  Outcome: Progressing     Problem: Respiratory Compromise (Pneumonia)  Goal: Effective Oxygenation and Ventilation  Outcome: Progressing

## 2019-04-09 DIAGNOSIS — Z09 Encounter for follow-up examination after completed treatment for conditions other than malignant neoplasm: Secondary | ICD-10-CM

## 2019-04-18 ENCOUNTER — Encounter
Admit: 2019-04-18 | Discharge: 2019-05-08 | Payer: PRIVATE HEALTH INSURANCE | Attending: Radiation Oncology | Primary: Radiation Oncology

## 2019-04-18 DIAGNOSIS — C61 Malignant neoplasm of prostate: Secondary | ICD-10-CM

## 2019-04-18 DIAGNOSIS — Z5111 Encounter for antineoplastic chemotherapy: Secondary | ICD-10-CM

## 2019-04-18 NOTE — Unmapped (Signed)
RADIATION ONCOLOGY FOLLOW-UP VISIT NOTE     Encounter Date: 04/18/2019  Patient Name: Harold Richards  Medical Record Number: [id number]    DIAGNOSIS:  63 y.o.??male??with high??risk prostate cancer, cT1c, Gleason??4+4=8??in 2/12??cores, PSA 4.59 s/p external beam RT (45Gy) with brachytherapy boost (brachy done 11/05/2018)    DURATION SINCE COMPLETION OF RADIOTHERAPY:  6 months (brachy on 11/05/2018)    Lupron #1 07/17/2018  Lupron #2 10/16/2018  Lupron #3 01/16/2019  Lupron (1 month injection) 04/18/2019    ASSESSMENT:  Disease Status: No Evidence of Disease (NED)    RECOMMENDATIONS:  1. PSA today <0.1 on 01/16/2019- he would like to check every other visit as PSA should remain undetectable while on lupron.  We discussed that PSA can rise as testosterone recovers after he finishes ADT  2. ADT:  Due for lupron #4 today- at this time there is a shortage of 3 month and 6 month lupron- we do have 1 month shots and he would like to have this and return in 1 month for his next lupron (hopefully 3 month injection).  I presented a 6 month Eligard injection as an alternative- this would require prior auth and he needs to get to work so cannot wait for [place] to work on this.  3. FOLLOW-UP:  He will return in 1 month for next lupron- I will see back in clinic in 4 months for lupron #4 (6 total injections planned)  4. GU:  Continue flomax BID and prn azo.  Discussed that symptoms may improve as radioactive seeds decay further (currently ~20% strength)  5. GI:  No issues- colonoscopy in 2017 with recommendation for next scope in 2022    INTERVAL HISTORY:      Overall doing OK today.  His urinary symptoms are improving from last time- still not back to baseline.  He has daytime frequency/urgency, occasional dysuria, and nocturia ~2x.  Rare urge incontinence.  All of this has improved over the past few months.  Still taking flomax BID- he tried cutting back to once daily and had worsening of urinary symptoms.  No GI issues- he takes lactulose at baseline for Hep-C cirrhosis so has loose stools at baseline but no changes that he has noticed.  He has not noticed any hot flashes recently but does have decreased libido.  He has had some non-oncologic health issues including a community-acquired pneumonia 1 week ago from which he is still recovering.  He also returned to work full time this week at [employer]- between this and recent hospitalization for CAP he is pretty fatigued.    Baseline  General:??Very fatigued. No ED  Urinary:??On flomax, post-void residual 0 cc.??Has frequency, nocturia 2-3x/night, no urgency, hematuria, dysuria.??  GI: Denies loose stools, rectal pain, or bleeding.   Colonoscopy:??Done 10/2015, found polyp. ??Repeat in 5 years.    REVIEW OF SYSTEMS:  A comprehensive review of 10 systems was negative except for pertinent positives noted in HPI.    PAST MEDICAL HISTORY/FAMILY HISTORY/SOCIAL HISTORY:  Reviewed in EPIC    ALLERGIES/MEDICATIONS:  Reviewed in EPIC    PHYSICAL EXAM:  Vital Signs for this encounter:   BP 140/78  - Pulse 73  - Temp 37 ??C (98.6 ??F) (Temporal)  - Wt 54 kg (119 lb)  - SpO2 100%  - BMI 19.21 kg/m??   Karnofsky/Lansky Performance Status: 90,  Able to carry on normal activity; minor signs or symptoms of disease (ECOG equivalent 0)  General:   No acute distress, alert and oriented X  4   Head: Normocephalic, without obvious abnormality, atraumatic  Eyes: EOMI, no scleral icterus  Lungs: Normal work of breathing  Heart: regular rate and rhythm, S1, S2 normal, no murmur, click, rub or gallop  Abdomen: non-distended  Extremities: extremities normal, atraumatic, no cyanosis or edema  Lymph nodes: No palpable supraclavicular or cervical lymphadenopathy  Neurologic: Grossly normal   Rectal: Deferred given recent brachytherapy      RADIOLOGY:  No new imaging to review    Labs:    PSA   Date Value Ref Range Status   01/16/2019 <0.10 0.00 - 4.00 ng/mL Final   05/18/2018 4.59 (H) 0.00 - 4.00 ng/mL Final   10/26/2017 3.88 0.00 - 4.00 ng/mL Final         Rayetta Humphrey, MD  Assistant Professor  [facility] Dept of Radiation Oncology  04/18/2019

## 2019-04-18 NOTE — Unmapped (Signed)
REASON FOR VISIT: Hospital Follow-up Care Management    PLAN:  #Tobacco Cessation  Pt was encouraged to continue with his tobacco cessation efforts and also encouraged to reach out should he need any additional support.    #Advance Care Planning  Participants: Patient  Discussion/Action: Patient confirmed that he would like for his wife to remain listed as his Health Care Decision Makers and that he would also like for his daughters to be listed as well. Information updated in the [facility] field. Pt welcomed information be mailed to him about Advance Directives. Paperwork placed in mail.    Health Care Decision Maker as of 04/18/2019    HCDM (patient stated preference): Pamala Duffel - Spouse - [phone]    #Contact information  Pt provided w/Patient Resource guide including After Hours, Same Day, Urgent Care, mental health, [facility] and SW contact information. Pt's contact information was updated in their chart.    #F/u calls  Pt will receive 2 weeks of telephone f/u from a care team member following visit. Pt encouraged to contact LCSW should needs arise.    ___________________________________________________     HISTORY AND ASSESSMENT:   LCSW called pt to follow up with him after his recent hospitalization and before his hospital follow up appointment. Patient answered and stated that he is feeling better than when he initially left the hospital. Denied concerning symptoms and shared that he just completed his antibiotics.    Pt lives with his wife in a house. He denies issues with home safety. Describes sufficient social support from family. States that he has a lot of family who lives around him. Family, social and cultural characteristics were assessed during the visit and plan.     He is not set up with home health services or personal aide services at this time. Denies need.    Stated that he is currently using a breathing device once a day but that he is unsure of the name of it. Angelique Blonder use of other or need for additional medical equipment at this time.     Identified barriers to care:  Denies affordability difficulties  Denies transportation barriers at this time, shares his wife can bring him to appointments if he is not feeling well enough.  Denies food insecurity  Endorsed some auditory communication challenges, shared he will be having a hearing exam at work and believes this will occur within the next few months as things return to normal and are rescheduled.  Denies visual communication challenges though reports he wears glasses  Denies illiteracy     Mental Health/Substance Use:  Alcohol use: no, denies drinking alcohol for the past 10 years  Illicit Substance use: denies  Tobacco use: Reports that he has made efforts to cut down on tobacco use since his hospitalization. Prior to being hospitalized he described smoking a pack and a half of cigarettes a day and that he is now down to 1/2 a pack a day. He is pleased with his efforts and is using both Lozanges and Gum to help cut back. Ultimately his goal is to quit.   PHQ9: Scored 3, negative for SI and HI. Pt denies need for intervention at this time.      Advanced Directives: does not have on file but does have his wife listed as his HCDM.    LCSW to route information to hospital follow up team that will be seeing pt during his visit tomorrow.  ___________________________________________________   Emeline Gins, MSW, LCSW  Care Manager - [facility] Internal Medicine  Phone: [phone]  Pager: [phone]

## 2019-04-18 NOTE — Unmapped (Signed)
Here to fu with Dr Carles Collet. Due for lupron today. Prostate screening completed.    VS taken.    LPt received Lupron injection in 3.75 mg given in each buttock - pt tolerated well.    AVS received at scheduling.    Pt stable and ambulated independently from clinic.

## 2019-04-18 NOTE — Unmapped (Signed)
REASON FOR VISIT: Hospital Follow-up Medication Management    ASSESSMENT AND PLAN:  #PNA  - feeling better, abx complete  - no recommendation    #tobacco use  - recommend nicotine patches 21mg  daily x 6 weeks, 14mg  daily x 2 weeks, then 7 mg daily x 2 weeks  - continue nicotine lozenges or gum prn cravings    #CAD  - consider switching atorvastatin to 20mg  tablets to avoid need to cut.    #cirrhosis  - hx of HE, but appears to have clear mentation today on lactulose 15g bid and rifaxamin  - no changes recommended today    #Medication Management  - Medications prescribed or ordered upon discharge were reviewed today and reconciled with the most recent outpatient medication list.  Medication reconciliation was conducted by a prescribing practitioner, or clinical pharmacist.   - Reviewed the indication, dose, and frequency of each medication with patient    Recommendations and medication-related problems were discussed directly with Dr. Barnetta Chapel who will see Harold Richards immediately following this visit. Updated medication list and medication changes will be provided to the patient as a printed after visit summary.    HISTORY OF PRESENT ILLNESS:   Harold Richards is a 63 y.o. year old male with cirrhosis (hx of hepatic encephalopathy), hep C, hx of substance abuse, prostate cancer, and COPD who was recently hospitalized for pneumonia. He was d/c with levofloxacin (last day 9/4).    He presents to the Internal Medicine clinic for hospital follow up.     Today, he reports that he is feeling well.     BMs - 2/day    Smoking - 1/2 ppd, down from 1 ppd. No patches. Using nicotine gum or lozenge q hour or more. Would like nicotine patch.     Harold Richards did not bring medication bottles, denies missed doses of medications and uses a pillbox.    Patient & spouse manage medications at home.     Patient does not have to do without their medications because of cost.     DISCREPANCIES NOTED DURING MED REVIEW:  1.   mucinex qam (dose unsure), not new    MEDICATIONS AND ALLERGIES:   Reviewed and updated in EPIC      ___________________________________________________   Jeanella Flattery, PharmD, CPP

## 2019-04-18 NOTE — Unmapped (Signed)
[facility] Refill Coordination Note    Specialty Medication(s) to be Shipped:   Infectious Disease: Xifaxan    Other medication(s) to be shipped: n/a     Harold Richards, DOB: August 07, 1956  Phone: [phone] (home)       All above HIPAA information was verified with patient.     Completed refill call assessment today to schedule patient's medication shipment from the [facility] [phone]).       Specialty medication(s) and dose(s) confirmed: Regimen is correct and unchanged.   Changes to medications: [name] reports no changes at this time.  Changes to insurance: No  Questions for the pharmacist: No    Confirmed patient received Welcome Packet with first shipment. The patient will receive a drug information handout for each medication shipped and additional FDA Medication Guides as required.       DISEASE/MEDICATION-SPECIFIC INFORMATION        N/A    SPECIALTY MEDICATION ADHERENCE     Medication Adherence    Patient reported X missed doses in the last month: 0  Specialty Medication: xifaxan 550                xifaxan 550 mg: 10 days of medicine on hand         SHIPPING     Shipping address confirmed in Epic.     Delivery Scheduled: Yes, Expected medication delivery date: 9/15.     Medication will be delivered via Next Day Courier to the home address in Epic WAM.    [name]   [facility] Specialty Technician

## 2019-04-19 ENCOUNTER — Encounter
Admit: 2019-04-19 | Discharge: 2019-04-20 | Payer: PRIVATE HEALTH INSURANCE | Attending: Internal Medicine | Primary: Internal Medicine

## 2019-04-19 DIAGNOSIS — F17200 Nicotine dependence, unspecified, uncomplicated: Secondary | ICD-10-CM

## 2019-04-19 DIAGNOSIS — J438 Other emphysema: Secondary | ICD-10-CM

## 2019-04-19 DIAGNOSIS — J189 Pneumonia, unspecified organism: Secondary | ICD-10-CM

## 2019-04-19 DIAGNOSIS — Z09 Encounter for follow-up examination after completed treatment for conditions other than malignant neoplasm: Secondary | ICD-10-CM

## 2019-04-19 DIAGNOSIS — Z23 Encounter for immunization: Secondary | ICD-10-CM

## 2019-04-19 MED ORDER — ATORVASTATIN 20 MG TABLET
ORAL_TABLET | Freq: Every evening | ORAL | 3 refills | 90 days | Status: CP
Start: 2019-04-19 — End: ?

## 2019-04-19 MED ORDER — NICOTINE 14 MG/24 HR DAILY TRANSDERMAL PATCH
MEDICATED_PATCH | TRANSDERMAL | 0 refills | 14.00000 days | Status: CP
Start: 2019-04-19 — End: 2019-05-03

## 2019-04-19 MED ORDER — NICOTINE 21 MG/24 HR DAILY TRANSDERMAL PATCH
MEDICATED_PATCH | TRANSDERMAL | 0 refills | 42 days | Status: CP
Start: 2019-04-19 — End: 2019-05-31

## 2019-04-19 MED ORDER — NICOTINE 7 MG/24 HR DAILY TRANSDERMAL PATCH
MEDICATED_PATCH | TRANSDERMAL | 0 refills | 14.00000 days | Status: CP
Start: 2019-04-19 — End: 2019-05-03

## 2019-04-19 NOTE — Unmapped (Signed)
Internal Medicine Hospital Follow-Up Visit    ASSESSMENT / PLAN:  Things to follow-up at next visit:   1. 6 week f-up CXR for resolution of lesion  2. Consider repeat swallow evaluation for aspiration given location of PNA and ongoing dysphagia  3. F-up smoking cessation with patches  4. Discuss statin dose with hepatology, consider increasing if ok per hepatology      1. Community acquired pneumonia of right upper lobe of lung    2. Hospital discharge follow-up    3. Other emphysema (CMS-HCC)    4. Tobacco use disorder    5. Need for diphtheria-tetanus-pertussis (Tdap) vaccine    6. Need for shingles vaccine      #Community acquired pneumonia of right upper lobe of lung  Improved breathing S/p Abx. Increased risk of aspiration PNA.  - Consider repeat evaluation for aspiration  - ENT F-up for dysphagia  - 6 week F-up CXR    #Other emphysema (CMS-HCC)  Stable on current regimen. Continue to encourage smoking cessation. Inhaler teaching UTD  - Continue advair     #Tobacco use disorder  Working on cessation. Interested in patches. Using gum and lozenges.   - nicotine (NICODERM CQ) 21 mg/24 hr patch; Place 1 patch on the skin daily. 1 nicotine patch (21 mg/day) daily for six weeks  Dispense: 42 patch; Refill: 0  - nicotine (NICODERM CQ) 14 mg/24 hr patch; Place 1 patch on the skin daily for 14 days. 14 mg patch daily for two weeks  Dispense: 14 patch; Refill: 0  - nicotine (NICODERM CQ) 7 mg/24 hr patch; Place 1 patch on the skin daily for 14 days. 7 mg daily for two week  Dispense: 14 patch; Refill: 0    #Need for diphtheria-tetanus-pertussis (Tdap) vaccine  - Tdap vaccine greater than or equal to 7yo IM    #Need for shingles vaccine  - Zoster Vaccine - Recombinant,Adjuvanted (Shingrix) to pharmacy    #Flu vaccine today    Medication adjustments:   Medicine Changes:  - nicotine (NICODERM CQ) 21 mg/24 hr patch; Place 1 patch on the skin daily. 1 nicotine patch (21 mg/day) daily for six weeks, THEN  - nicotine (NICODERM CQ) 14 mg/24 hr patch; Place 1 patch on the skin daily for 14 days. 14 mg patch daily for two weeks, THEN  - nicotine (NICODERM CQ) 7 mg/24 hr patch; Place 1 patch on the skin daily for 14 days. 7 mg daily for two week  Dispense, THEN STOP      Patient provided with an updated and reconciled medications list.    Follow Up: Return in about 4 weeks (around 05/17/2019).    Future Appointments   Date Time Provider Department Center   04/29/2019 12:30 PM IC CT MBL [facility] [place] - IC   04/29/2019  1:45 PM Rupali Harvie Bridge, MD [facility]   05/17/2019  7:30 AM ONCINF [facility]S [facility]   05/27/2019  3:20 PM [name], MD [facility]   05/28/2019  8:00 AM Dawn Renato Gails, FNP [facility]   08/15/2019  8:30 AM Peterson Lombard, MD [facility]       Medication adherence and barriers to the treatment plan have been addressed. Opportunities to optimize healthy behaviors have been discussed. Patient / caregiver voiced understanding.        CHIEF COMPLAINT: Hospital Follow-Up for Hospitalization Follow-up (Followup)    Date of Hospitalization Discharge:  04/08/19  Reviewed: Discharge summary, Labs and Imaging (e.g. xrays, CT, etc); See results in Epic  Discussed care with: Social worker and Forensic psychologist by phone or other method (Encounter type: Patient Outreach):  Patient Outreach History (Since 04/05/2019)     Hepatitis B Refill Coordination     Date Method of Outreach Associated Actions User Next Outreach    04/18/2019 11:00 AM Telephone  [name] 05/14/2019          Transition of Care     Date Method of Outreach Associated Actions User Next Outreach    04/09/2019 12:38 PM Telephone  Jacques Earthly, RN                Successful contact made or 2 unsuccessful attempts within 2 business days  History obtained from: Patient    HISTORY OF PRESENT ILLNESS:  Summary of hospitalization:   Harold Richards is a 63 y.o. male with a past medical history of cirrhosis, hep C, previous history of substance abuse, prostate cancer with implantable seeds, COPD who was recently hospitalized for pneumonia.   ??  Outpatient Provider Follow Up Issues:   [ ]  abx adherence   [ ]  smoking cessation   ??  Hospital Course:   ??  #Acute acquired pneumonia:  Patient presented with worsening shortness of breath and cough in the setting of a fever to 101. ??X-ray with right upper lobe consolidation. ??Leukocytosis 21. ??Initial elevation of lactate. ??However, well-appearing on room air. ??Given his comorbidities the patient does have a pneumonia severity index of 110 points indicating hospitalization based on his risk. ??He is status post ceftriaxone azithromycin in the ED. He developed a rash from the CTX and was transitioned to received Levaquin for a 5 day course of therapy. He was also discharged with smoking cessation supplies.     Interval update:  Since hospital discharge, Harold Richards reports improvement in symptoms. He is working on smoking cessation. Reduced to 1/2 PPD. Using gum and lozenge would like patches. Having 2 BM/day. Has tinnitus and poor hearing, scheduled for f-up in ENT. Also has dysphagia with CT scheduled. Breathing is better. Coughing a little bit more than usual. No fevers or chills, no chest pains, no LE edema. Just started work full time this week. Gets winded but feels like it is due to smoking. Inhalers provide relief. Completed Abx course. Received Flu vaccine today. Has burning with urination and nocturia in the setting of prostate CA and ongoing radiation. Responded to flomax. Feels like inhalers help with breathing. Lost ~20 lbs in past year in setting of prostate CA. Feels like weight has stabilized.     MEDICATIONS AND ALLERGIES:   Reviewed and updated in EPIC    Medication and allergy review was completed by the pharmacist and discussed during this visit. Please see their note within this encounter.    SOCIAL/FAMILY HISTORY: Social History:  Reviewed in Epic    Biopsychosocial barriers to care and advanced directives were addressed by the Child psychotherapist and discussed during the visit. Please see their note in chart prior to this encounter.    REVIEW OF SYSTEMS:    All other systems reviewed are negative except as noted here or in HPI.    PHYSICAL EXAM  Vitals:    Vitals:    04/19/19 0826   BP: 119/57   Pulse: 77   Temp: 36.9 ??C (98.4 ??F)   SpO2: 98%       Exam:  Gen: Thin  man in NA.  HEENT: Normocephalic, atraumatic wearing mask on face. .   Eyes: Anicteric, non-injected, extraocular movements grossly intact  MSK: Neck supple  Cardiovascular: distant sounds. Regular rate and rhythm, no murmurs, rubs or gallops. Normal S1 and S2. No cyanosis. Ext: Warm and well perfused, no clubbing, no cyanosis or edema  Pulmonary: Coarse, distant lung sounds. Left exp wheeze. R upper diminished. No crackles.   GI: Soft, non-tender, non-distended. Bowel sounds present   Skin: No rashes. No jaundice.  Neuro: Alert, oriented and responding to questions appropriately.   Psych: Normal Mood, normal Affect     Labs:  Reviewed from discharge.  See Epic labs.  Radiology: Reviewed radiology studies from discharge. See in Epic.       MEDICAL/SURGICAL HISTORY:  Patient Active Problem List   Diagnosis   ??? Chronic headaches   ??? Renal calculus   ??? Tobacco use disorder   ??? Nasal septum perforation   ??? Insomnia   ??? Other emphysema (CMS-HCC)   ??? Hepatitis C   ??? Osteoporosis of lumbar spine   ??? Proteinuria   ??? Prostate cancer (CMS-HCC)   ??? Cirrhosis (CMS-HCC)   ??? Hepatic encephalopathy (CMS-HCC)   ??? Community acquired pneumonia of right upper lobe of lung

## 2019-04-19 NOTE — Unmapped (Signed)
[facility] BPs  BP#1 121/57   BP#2 122/59  BP#3 115/56    Average BP 119/57  (please note this as a comment in vitals)

## 2019-04-19 NOTE — Unmapped (Signed)
Medicine Changes:  - nicotine (NICODERM CQ) 21 mg/24 hr patch; Place 1 patch on the skin daily. 1 nicotine patch (21 mg/day) daily for six weeks, THEN  - nicotine (NICODERM CQ) 14 mg/24 hr patch; Place 1 patch on the skin daily for 14 days. 14 mg patch daily for two weeks, THEN  - nicotine (NICODERM CQ) 7 mg/24 hr patch; Place 1 patch on the skin daily for 14 days. 7 mg daily for two week  Dispense, THEN STOP    Things to do:  - Tdap vaccine greater than or equal to 7yo IM  - Zoster Vaccine - Recombinant,Adjuvanted (Shingrix)    Follow-up:   ?? 3-4 weeks with Dr Hyacinth Meeker    Call or Come Back to Clinic if:   ?? Fevers or chills  ?? Chest pains  ?? Breathing gets or you have more phleghm        IMPORTANT NUMBERS  Care Manager  I???m having trouble with my health because of cost, my mood, trouble getting to clinic, or where I live. How can I get help?  ? Call Ferol Luz, MSW, LCSW at [phone] your Care Manager can help!  ? They are available Monday-Friday 8:00AM -5:00PM    Financial Counselor: [phone]    Same Day Clinic   I'm sick today and need an appointment during office hours. Who do I call?  ?? Call [phone] or toll free [phone], ask for an appointment in the Same Day Clinic  ?? Same Day Clinic is located here in the [facility] Internal [facility] Clinic    After Hours, Weekend, or Holidays:  It's after 5:00pm during the week or it's the weekend. How do I get medical attention?  ?? Call the [facility] 24/7 Nursing Line [phone] to get nurse advice.  ?? Go to [facility] walk-in clinic at [address], [address], [place], [place]; [phone]; 7 days a week from 9:00AM - 8:00PM.    How do I schedule an appointment or get a message to my doctor?  Call [phone] or toll free [phone].    How do I cancel or reschedule an appointment?  ?? Call [phone] or toll free [phone].   You may also cancel your appointment by using [facility] (patient portal).    How do I request medication refills?  Request a refill via [facility] (patient portal), call clinic at [phone] or have your pharmacy fax the request to [phone].

## 2019-04-22 MED FILL — XIFAXAN 550 MG TABLET: ORAL | 30 days supply | Qty: 60 | Fill #2

## 2019-04-22 MED FILL — XIFAXAN 550 MG TABLET: 30 days supply | Qty: 60 | Fill #2 | Status: AC

## 2019-04-26 NOTE — Unmapped (Signed)
Message Title: Transitions of Care Phone Call Follow-Up  Transitions Phone Call Follow-Up: Week # 1 after hospital f/u clinic visit   Person spoken with: Ladene Artist    Conditions and Symptoms  New problems: None  Medications: No changes    Post-Acute Services  Home Health Services - Agency name/contact: None    DME: None                                             Upcoming appointments:   04/29/2019 12:30 PM (Arrive by 12:15 PM) IC CT MBL RAD [address] [place] - IC   04/29/2019 1:45 PM Rupali Harvie Bridge, MD [place] OTOLARYNGOLOGY [address] [place] [facility]   05/17/2019 7:30 AM (Arrive by 7:15 AM) ONCINF [facility] LABS  [facility]Y [facility] [place] [facility]   05/27/2019 3:20 PM (Arrive by 3:05 PM) [name], MD [facility] INTERNAL MEDICINE [place] [facility]   05/28/2019 8:00 AM (Arrive by 7:30 AM) Fawn Kirk, FNP [facility] [place] [facility]   08/15/2019 8:30 AM (Arrive by 8:00 AM) Peterson Lombard, MD [facility] RADIATION ONCOLOGY [place] [facility]       Barriers: none  Community resource needs: none  Advanced Directives: talked with SW, has wife and daughters      Comments: Pt seems well and states he is and that things are getting better.    Provided the patient with the following information:  -  Same Day Clinic:  [phone].  8am-5pm M-F.   -  After Hours:  [phone].  A nurse will answer and can help you decide what kind of medication attention you need.  A doctor is also there to help  [facility] Urgent Care:  [phone].  If you are sick, but not injured:  9am-8pm Mon-Sun.  [address], [address], [place], [place] off of [address].  All walk-ins accepted.    [facility] at the [facility]:  [phone].  7am-9pm Mon-Fri; 12pm-5pm [address]  [address], [place] [address].  All walk-ins accepted.        Information forwarded to PCP for review.

## 2019-04-29 ENCOUNTER — Ambulatory Visit: Admit: 2019-04-29 | Discharge: 2019-04-29 | Payer: PRIVATE HEALTH INSURANCE

## 2019-04-29 ENCOUNTER — Encounter
Admit: 2019-04-29 | Discharge: 2019-04-29 | Payer: PRIVATE HEALTH INSURANCE | Attending: Student in an Organized Health Care Education/Training Program | Primary: Student in an Organized Health Care Education/Training Program

## 2019-04-29 DIAGNOSIS — J329 Chronic sinusitis, unspecified: Secondary | ICD-10-CM

## 2019-04-29 DIAGNOSIS — R490 Dysphonia: Secondary | ICD-10-CM

## 2019-04-29 DIAGNOSIS — J3489 Other specified disorders of nose and nasal sinuses: Secondary | ICD-10-CM

## 2019-04-30 NOTE — Unmapped (Signed)
Otolaryngology     Reason for visit:  Follow up     History of Present Illness: recall initial presentation 03/05/19:  Harold Richards is a 63 y.o. year old male who presents with a 6  months history of sore throat with eating. He has intermitent choking on solids and liquids. He has choked on eggs and breads and meats. He has to chew it up fine to get it to go down well. There is burning with liquids but no choking. No pneumonias. He has had 25lbs weight loss in 8 months 2/2 prostate cancer treatment.     6 bottles water/daily   8-9 pepsis/day.     Severity: moderate    Voice symptoms are described as None  Singing voice symptoms include: None    Associated symptoms:  Reflux symptoms include: none\  Airway symptoms include: None    ---> acute/chronic sinusitis, pharyngitis, treated with max medical therapy.     04/29/19: He comes in today for follow-up.  He states he is overall feeling better there is less yellow thick drainage.  He is able to swallow better as well.  He still continues to have some nasal drainage.      Past Medical History:  Past Medical History:   Diagnosis Date   ??? Asthma    ??? Chronic headaches    ??? Colon polyp    ??? Enlarged prostate    ??? GERD (gastroesophageal reflux disease) 02/08/2013   ??? Hepatitis C    ??? Hx of substance abuse (CMS-HCC)     hx of recreational drug use (cocaine IV, intranasal and crack),    ??? Infectious viral hepatitis    ??? Lung nodule seen on imaging study 07/12/2016   ??? Osteoporosis    ??? Other emphysema (CMS-HCC)    ??? Renal calculus    ??? Skin cancer    ??? Tobacco abuse 02/08/2013       Past Surgical History:  Past Surgical History:   Procedure Laterality Date   ??? CERVICAL FUSION      cage   ??? PR COLSC FLX W/RMVL OF TUMOR POLYP LESION SNARE TQ N/A 10/20/2015    Procedure: COLONOSCOPY FLEX; W/REMOV TUMOR/LES BY SNARE;  Surgeon: Annie Paras, MD;  [facility];  Service: Gastroenterology   ??? PR EXCISION SUBMAXILLARY GLAND Right 03/31/2015 Procedure: EXC SUBMANDIBULAR GLAND;  Surgeon: Lauralee Evener, MD;  Location: [facility];  Service: ENT   ??? PR UPPER GI ENDOSCOPY,DIAGNOSIS N/A 08/18/2017    Procedure: UGI ENDO, INCLUDE ESOPHAGUS, STOMACH, & DUODENUM &/OR JEJUNUM; DX W/WO COLLECTION SPECIMN, BY BRUSH OR WASH;  Surgeon: Rona Ravens, MD;  Location: [facility];  Service: Gastroenterology       Medications:    Current Outpatient Medications:   ???  albuterol HFA 90 mcg/actuation inhaler, Inhale 2 puffs every four (4) hours as needed for wheezing., Disp: 3 Inhaler, Rfl: 3  ???  aspirin (ECOTRIN) 81 MG tablet, Take 81 mg by mouth daily., Disp: , Rfl:   ???  atorvastatin (LIPITOR) 20 MG tablet, Take 1 tablet (20 mg total) by mouth nightly., Disp: 90 tablet, Rfl: 3  ???  cholecalciferol, vitamin D3, (VITAMIN D3) 1,000 unit capsule, Take 1,000 Units by mouth daily., Disp: , Rfl:   ???  cyanocobalamin (VITAMIN B-12) 250 MCG tablet, Take 250 mcg by mouth daily., Disp: , Rfl:   ???  ferrous sulfate 325 mg (65 mg iron) CpER, Take 1 tablet by  mouth daily. , Disp: , Rfl:   ???  fluticasone propion-salmeterol (ADVAIR DISKUS) 250-50 mcg/dose diskus, Inhale 1 puff Two (2) times a day., Disp: 3 each, Rfl: 3  ???  guaiFENesin (MUCINEX) 600 mg 12 hr tablet, Take 600 mg by mouth every morning., Disp: , Rfl:   ???  lactulose (CHRONULAC) 10 gram/15 mL solution, Take 10 g by mouth Two (2) times a day. , Disp: , Rfl:   ???  loratadine (CLARITIN) 10 mg tablet, Take 10 mg by mouth daily., Disp: , Rfl:   ???  multivitamin (TAB-A-VITE/THERAGRAN) per tablet, Take 1 tablet by mouth daily., Disp: , Rfl:   ???  nicotine (NICODERM CQ) 14 mg/24 hr patch, Place 1 patch on the skin daily for 14 days. 14 mg patch daily for two weeks, Disp: 14 patch, Rfl: 0  ???  nicotine (NICODERM CQ) 21 mg/24 hr patch, Place 1 patch on the skin daily. 1 nicotine patch (21 mg/day) daily for six weeks, Disp: 42 patch, Rfl: 0  ???  nicotine (NICODERM CQ) 7 mg/24 hr patch, Place 1 patch on the skin daily for 14 days. 7 mg daily for two week, Disp: 14 patch, Rfl: 0  ???  nicotine polacrilex (NICORETTE MINI) lozenge 2 mg, Apply 1 lozenge (2 mg total) to cheek every hour as needed for smoking cessation., Disp: 108 each, Rfl: 0  ???  nicotine polacrilex (NICORETTE) 4 MG gum, Apply 1 each (4 mg total) to cheek every hour as needed for smoking cessation (max 24 pieces per day)., Disp: 110 each, Rfl: 2  ???  rifAXIMin (XIFAXAN) 550 mg Tab, Take 1 tablet (550 mg total) by mouth Two (2) times a day., Disp: 60 tablet, Rfl: 6  ???  SUBOXONE 8-2 mg sublingual film, Place 1 Film under the tongue Two (2) times a day. , Disp: , Rfl: 0  ???  tamsulosin (FLOMAX) 0.4 mg capsule, Take 1 capsule (0.4 mg total) by mouth two (2) times a day., Disp: 90 capsule, Rfl: 3  ???  vit C,E-Zn-coppr-lutein-zeaxan (PRESERVISION AREDS 2) 250-200-40-1 mg-unit-mg-mg cap, Take 1 tablet by mouth Two (2) times a day., Disp: , Rfl:   ???  vitamin A 16109 UNIT capsule, Take 10,000 Units by mouth daily., Disp: , Rfl:      Allergies:  Allergies   Allergen Reactions   ??? Bicalutamide Rash   ??? Penicillins Rash   ??? Rocephin [Ceftriaxone] Rash     Patient received in Emergency Room and reported rash to stomach and back several hours later.        Family History:  Family History   Problem Relation Age of Onset   ??? Heart attack Father    ??? Hypertension Father    ??? Lung cancer Mother    ??? Bone cancer Mother    ??? Hypertension Mother    ??? Hepatitis Sister        Social History:  Social History     Tobacco Use   ??? Smoking status: Heavy Tobacco Smoker     Packs/day: 1.00     Years: 50.00     Pack years: 50.00     Types: Cigarettes     Start date: 06/16/1969   ??? Smokeless tobacco: Former Neurosurgeon     Types: Chew     Quit date: 2009   ??? Tobacco comment: Pt smokes 1ppd, pt expressed desire to minimize tobacco use    Substance Use Topics   ??? Alcohol use: No     Alcohol/week:  0.0 standard drinks   ??? Drug use: Not Currently     Types: Cocaine, IV     Comment: History of cocaine use years ago (intranasal, IV and crack), marijuana use.        Review of Systems:  Denies dysphagia, chest pain, sob, fevers, n/v, abd pain    Physical Exam:   Constitutional:  Vitals reviewed in chart, patient has normal appearance. Well nourished, well-developed, no acute distress, thin mmale  Voice:  Mildly hoarse, rough  Respiration:  Breathing comfortably, no stridor.  CV: No clubbing/cyanosis/edema in hands.   Eyes:  extraocular motion intact, sclera normal.   Neuro:  Alert and oriented times 3, Cranial nerves 2-12 intact and symmetric bilaterally.   Head and Face:  Skin with no masses or lesions, sinuses nontender to palpation, facial nerve fully intact.   Salivary Glands:  Parotid and submandibular glands normal bilaterally.   Ears: normal external ears  Nose:  External nose midline, anterior rhinoscopy is normal with limited visualization just to the anterior interior turbinate.   Oral Cavity/Oropharynx/Lips:  Normal mucous membranes, normal floor of mouth/tongue/oropharynx, no masses or lesions are noted.  Upper dentures.   Pharyngeal Walls:  No masses noted.   neck/Lymph:  No lymphadenopathy, no thyroid masses.  Larynx: Mirror evaluation insufficient to visualize secondary to prominent gag    CT brainlab today:   IMPRESSION:??  Chronic left maxillary sinus disease with mucoperiosteal thickening and intraluminal polypoidal soft tissue containing internal hyperdense material, which may reflect inspissated secretions or chronic fungal colonization. Partial occlusion of the left maxillary ostium.    Assessment:   [name] is a 63 y.o. year old male who presents today with symptoms of sore throat odynophagia.    He  has a past medical history of Asthma, Chronic headaches, Colon polyp, Enlarged prostate, GERD (gastroesophageal reflux disease) (02/08/2013), Hepatitis C, substance abuse (CMS-HCC), Infectious viral hepatitis, Lung nodule seen on imaging study (07/12/2016), Osteoporosis, Other emphysema (CMS-HCC), Renal calculus, Skin cancer, and Tobacco abuse (02/08/2013).      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   Mucositis/pharyngitis  Cerumen impaction    Plan:  Patient has chronic left maxillary sinusitis, possible allergic fungal sinusitis.  This was refractory to maximal medical therapy.  Will refer to our rhinology colleagues for consideration of fess    Fu with me in 6months.

## 2019-05-06 NOTE — Unmapped (Signed)
30 Day Transitions Phone Call Follow-Up: Week # 2 after hospital f/u visit    Person spoken with: Harold Richards    Conditions and Symptoms  New problems: Denies new problems  Medications: Denies medication issues  Refill needed: Denies need for refills    Post-Acute Services  Home Health Services - Agency name/contact: Denies having this in the home. Denies need.      DME: Denies needs                                                Upcoming appointments: Reviewed using Aptmt Desk, pt denied difficulty with attending his upcoming appointments. Confirms having transportation.   Barriers to care: Barriers to Care: N/A  Community resource needs: Denies resources needs  HCDM updated: yes, information updated during recent HFU visit. Pt shared he received the advance directive information that LCSW had sent him previously. Shared he has not yet had a chance to look over the info but that he will make sure to do this soon.      Additional Information/Plan:  Will continue to support as appropriate.     Provided the patient with the following information:  -  Same Day Clinic:  [phone].  8am-5pm M-F.   -  After Hours:  [phone].  A nurse will answer and can help you decide what kind of medication attention you need.  A doctor is also there to help  [facility]:  [phone].  If you are sick, but not injured:  9am-8pm Mon-Sun.  [address], [place], [place] off of [address].  All walk-ins accepted.    [facility] at the [facility]:  [phone].  7am-9pm Mon-Fri; 12pm-5pm [address]  [address], [place] [address].  All walk-ins accepted.      Information forwarded to PCP for review.    Minutes spent providing outreach: 5 minutes    Emeline Gins  [facility]  [phone]

## 2019-05-09 ENCOUNTER — Ambulatory Visit: Admit: 2019-05-09 | Discharge: 2019-05-10 | Payer: PRIVATE HEALTH INSURANCE | Attending: Family | Primary: Family

## 2019-05-09 DIAGNOSIS — Z20828 Contact with and (suspected) exposure to other viral communicable diseases: Secondary | ICD-10-CM

## 2019-05-09 NOTE — Unmapped (Signed)
Assessment     Harold Richards is a 63 y.o. male presenting to [facility] for COVID testing.     Problem List Items Addressed This Visit     None      Visit Diagnoses     Contact with or exposure to viral disease    -  Primary    Relevant Orders    COVID-19 PCR          Plan     If no testing performed, pt counseled on routine care for respiratory illness.  If testing performed, COVID sent.  Patient directed to Home given findings during today's visit.    Subjective     Harold Richards is a 63 y.o. male who [facility] the [facility] with complaints of the following:    Exposure History: In the last 21 days?     Have you traveled outside of [place]? No               Have you been in close contact with someone confirmed by a test to have COVID? (Close contact is within 6 feet for at least 10 minutes) Yes, Who? not specified       Have you worked in a health care facility? No     Lived or worked facility like a nursing home, group home, or assisted living?    No         Are you scheduled to have surgery or a procedure in the next 3 days? No               Are you scheduled to receive cancer chemotherapy within the next 7 days?    No     Have you ever been tested before for COVID-19 with a swab of your nose? Yes: When: 04/11/19, Where: [facility]   Are you a healthcare worker being tested so to return to work No         Right now,  do you have any of the following that developed over the past 7 days (as stated by patient on intake form):    Subjective fever (felt feverish) No   Chills (especially repeated shaking chills) No   Severe fatigue (felt very tired) No   Muscle aches No   Runny nose No   Sore throat No   Loss of taste or smell No   Cough (new onset or worsening of chronic cough) No   Shortness of breath No   Nausea or vomiting No   Headache No   Abdominal Pain No   Diarrhea (3 or more loose stools in last 24 hours) No     History/Medical Conditions (as stated by patient on intake form):    Do you have any of the following:   Asthma or emphysema or COPD Yes   Cystic Fibrosis No   Diabetes No   High Blood Pressure  No   Cardiovascular Disease Yes   Chronic Kidney Disease No   Chronic Liver Disease Yes   Chronic blood disorder like Sickle Cell Disease  No   Weak immune system due to disease or medication Yes   Neurologic condition that limits movement  No   Developmental delay - Moderate to Severe  No   Recent (within past 2 weeks) or current Pregnancy No   Morbid Obesity (>100 pounds over ideal weight) No   Current Smoker Yes   Former Smoker Yes  Objective     Given above, testing performed: Yes    Testing Performed:  Test Specimen Type Sent to   COVID-19  NP Swab [place] [facility]       Scribe's Attestation: Aida Puffer, FNP obtained and performed the history, physical exam and medical decision making elements that were entered into the chart.  Signed by Christy Gentles serving as Scribe, on 05/09/2019 12:17 PM      The documentation recorded by the scribe accurately reflects the service I personally performed and the decisions made by me. Aida Puffer, FNP  May 09, 2019 2:14 PM

## 2019-05-09 NOTE — Unmapped (Signed)
I spoke with this Patient and confirmed an appointment for 10/1 11:45am [facility]

## 2019-05-09 NOTE — Unmapped (Signed)
Exposed to COVID positive person. [facility] Quick Test referral sent.

## 2019-05-09 NOTE — Unmapped (Signed)
-----   Message from Horris Latino, RN sent at 05/09/2019 10:01 AM EDT -----  Regarding: Referral to [facility] from Provider  Asymptomatic patient -- no exam needed, test only. Exposure.

## 2019-05-15 NOTE — Unmapped (Signed)
Hi,     Harold Richards contacted the Communication Center requesting to speak with [name] to discuss:    Patients spouse called to reschedule patients appointment due to patient being quarantined til 05/23/2019 for COVID.    Please contact Joanna at [phone].        Check Indicates criteria has been reviewed and confirmed with the patient:    [x]  Preferred Name   [x]  DOB and/or MR#  [x]  Preferred Contact Method  [x]  Phone Number(s)   []  MyChart     Thank you,   Jacques Navy  [facility]   [phone]

## 2019-05-16 NOTE — Unmapped (Signed)
[facility] Refill Coordination Note    Specialty Medication(s) to be Shipped:   Infectious Disease: Xifaxan    Other medication(s) to be shipped: n/a     [name], DOB: 18-Mar-1956  Phone: [phone] (home)       All above HIPAA information was verified with patient.     Completed refill call assessment today to schedule patient's medication shipment from the [facility] [phone]).       Specialty medication(s) and dose(s) confirmed: Regimen is correct and unchanged.   Changes to medications: [name] reports no changes at this time.  Changes to insurance: No  Questions for the pharmacist: No    Confirmed patient received Welcome Packet with first shipment. The patient will receive a drug information handout for each medication shipped and additional FDA Medication Guides as required.       DISEASE/MEDICATION-SPECIFIC INFORMATION        N/A    SPECIALTY MEDICATION ADHERENCE     Medication Adherence    Patient reported X missed doses in the last month: 0  Specialty Medication: xifaxan 550                xifaxan 550mg  : 12 days of medicine on hand         SHIPPING     Shipping address confirmed in Epic.     Delivery Scheduled: Yes, Expected medication delivery date: 10/15.     Medication will be delivered via Next Day Courier to the home address in Epic WAM.    [name]   [facility] Specialty Technician

## 2019-05-22 MED FILL — XIFAXAN 550 MG TABLET: 30 days supply | Qty: 60 | Fill #3 | Status: AC

## 2019-05-22 MED FILL — XIFAXAN 550 MG TABLET: ORAL | 30 days supply | Qty: 60 | Fill #3

## 2019-05-27 ENCOUNTER — Encounter: Admit: 2019-05-27 | Discharge: 2019-05-27 | Payer: PRIVATE HEALTH INSURANCE

## 2019-05-27 DIAGNOSIS — K7290 Hepatic failure, unspecified without coma: Principal | ICD-10-CM

## 2019-05-27 DIAGNOSIS — J320 Chronic maxillary sinusitis: Principal | ICD-10-CM

## 2019-05-27 DIAGNOSIS — K7469 Other cirrhosis of liver: Principal | ICD-10-CM

## 2019-05-27 DIAGNOSIS — J189 Pneumonia, unspecified organism: Principal | ICD-10-CM

## 2019-05-27 DIAGNOSIS — Z09 Encounter for follow-up examination after completed treatment for conditions other than malignant neoplasm: Principal | ICD-10-CM

## 2019-05-27 DIAGNOSIS — B182 Chronic viral hepatitis C: Principal | ICD-10-CM

## 2019-05-27 DIAGNOSIS — C61 Malignant neoplasm of prostate: Principal | ICD-10-CM

## 2019-05-27 DIAGNOSIS — F17200 Nicotine dependence, unspecified, uncomplicated: Principal | ICD-10-CM

## 2019-05-27 DIAGNOSIS — J438 Other emphysema: Principal | ICD-10-CM

## 2019-05-27 NOTE — Unmapped (Signed)
[facility] BPs  BP#1 115/54   BP#2 111/49  BP#3 114/49    Average BP 113/51  (please note this as a comment in vitals)

## 2019-05-27 NOTE — Unmapped (Signed)
Hospitalization for Community Acquired Pneumonia  Cirrhosis  Hep C treated  Hepatic encephalopathy  Emphysema  Prostate cancer  Tobacco abuse  Chronic left maxillary sinusitis  This 63 year old-man with multiple past medical problems is here for follow-up. Overall, he feels that he has been doing well since his hospital discharge. He was hospitalized on 8/30 for 1 day due to community acquired pneumonia. He was treated with empiric antibiotics and was discharged on 8/31, he has had no similar symptoms since then, including fever, shortness of breath, or chest tightness. He has noted a persistent dry cough that is not bothersome. On 9/30 he had a confirmed exposure to COVID, he was asymptomatic and COVID test was negative. He then quarantined for 14 days. He was finally able to get back to work last week and is relieved to be working again.     Since discharge, he has been working on tobacco cessation. He has been using the nicotine patches daily and nicorette gum as needed, making progress that he is proud of. He is down to 3-4 cigarettes a day, after smoking 1ppd for 50 years. The cravings have not been bad and he feels confident that he is going to able to quit this time.     For his chronic medical problems, he has been seeing a number of specialists. He will be seeing his hepatologist, Dr. Romeo Apple, tomorrow, and he has noted no new symptoms in regards to his cirrhosis or past hepatic encephalopathy. He has an appointment on 11/12 with ENT surgery for his chronic maxillary sinus infection. This infection is thought to be the cause of his prior dysphagia. He is being followed by Radiation Oncology at [facility] for his prostate cancer. He finished his radiation treatments, is now receiving Lupron shots, and has his brachytherapy seeds in place. The Lupron shots have caused some fatigue, but overall he is tolerating it well.           Vitals:    05/27/19 1501   BP: 113/51   Pulse: 63   Temp: 36.7 ??C (98.1 ??F) SpO2: 98%     Height: 167.6 cm (5' 6)   Weight: 56.3 kg (124 lb 1.6 oz)   Body mass index is 20.03 kg/m??.    Patient appears well.     Assessment:  Doing well since hospitalization  Chronic medical problems well controlled     Plan:  6 wk follow-up chest x-ray today  Follow-up in 6 months  Continue medication  Continue seeing specialists    Current Outpatient Medications   Medication Sig Dispense Refill   ??? albuterol HFA 90 mcg/actuation inhaler Inhale 2 puffs every four (4) hours as needed for wheezing. 3 Inhaler 3   ??? aspirin (ECOTRIN) 81 MG tablet Take 81 mg by mouth daily.     ??? atorvastatin (LIPITOR) 20 MG tablet Take 1 tablet (20 mg total) by mouth nightly. 90 tablet 3   ??? cholecalciferol, vitamin D3, (VITAMIN D3) 1,000 unit capsule Take 1,000 Units by mouth daily.     ??? cyanocobalamin (VITAMIN B-12) 250 MCG tablet Take 250 mcg by mouth daily.     ??? ferrous sulfate 325 mg (65 mg iron) CpER Take 1 tablet by mouth daily.      ??? fluticasone propion-salmeterol (ADVAIR DISKUS) 250-50 mcg/dose diskus Inhale 1 puff Two (2) times a day. 3 each 3   ??? guaiFENesin (MUCINEX) 600 mg 12 hr tablet Take 600 mg by mouth every morning.     ??? lactulose (CHRONULAC)  10 gram/15 mL solution Take 10 g by mouth Two (2) times a day.      ??? loratadine (CLARITIN) 10 mg tablet Take 10 mg by mouth daily.     ??? multivitamin (TAB-A-VITE/THERAGRAN) per tablet Take 1 tablet by mouth daily.     ??? nicotine (NICODERM CQ) 21 mg/24 hr patch Place 1 patch on the skin daily. 1 nicotine patch (21 mg/day) daily for six weeks 42 patch 0   ??? nicotine polacrilex (NICORETTE) 4 MG gum Apply 1 each (4 mg total) to cheek every hour as needed for smoking cessation (max 24 pieces per day). 110 each 2   ??? rifAXIMin (XIFAXAN) 550 mg Tab Take 1 tablet (550 mg total) by mouth Two (2) times a day. 60 tablet 6   ??? SUBOXONE 8-2 mg sublingual film Place 1 Film under the tongue Two (2) times a day.   0 ??? tamsulosin (FLOMAX) 0.4 mg capsule Take 1 capsule (0.4 mg total) by mouth two (2) times a day. 90 capsule 3   ??? vit C,E-Zn-coppr-lutein-zeaxan (PRESERVISION AREDS 2) 250-200-40-1 mg-unit-mg-mg cap Take 1 tablet by mouth Two (2) times a day.     ??? vitamin A 09811 UNIT capsule Take 10,000 Units by mouth daily.       No current facility-administered medications for this visit.        I reviewed past medical history.     Medication adherence and barriers to the treatment plan have been addressed. Opportunities to optimize healthy behaviors have been discussed. Patient / caregiver voiced understanding.     IDoree Albee MS3, scribed this note in the presence of Jacquelin Hawking, MD.      I attest that I have reviewed the student note and that the components of the history of the present illness, the physical exam, and the assessment and plan documented were performed by me or were performed in my presence by the student where I verified the documentation and performed (or re-performed) the exam and medical decision making.

## 2019-05-28 ENCOUNTER — Encounter: Admit: 2019-05-28 | Discharge: 2019-05-29 | Payer: PRIVATE HEALTH INSURANCE | Attending: Family | Primary: Family

## 2019-05-28 NOTE — Unmapped (Signed)
[facility] Ph [phone] Fax [phone]    Woodfin Ganja, M.D.  Assistant Professor of Medicine  [facility]  [place] [facility] [place] [facility] [place]    [phone]    PCP:  Jacquelin Hawking, MD     Reason for Office Follow-up:  Follow-up for cirrhosis care. + decompensated cirrhosis based on +HE. He has demonstrated low MELD score. Patient s/p treatment and cure of HCV 09/24/14.  Newly diagnosed prostate cancer. Finished radiation txment March 2020 and continues with Lupron.     Presentation of Current Illness:    [name] is a 63 y.o. pleasant Caucasian gentleman whose chief complaint today is continued liver care. 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 Cured of HCV dating back to 2016 (Harvoni x 12 weeks). He was treatment naive with genotype 1a. History of recreational drug use (cocaine IV, intranasal and crack), three tattoos with one being homemade, and incarcerated once. Fibroscan 20 kPa consistent with F4 (cirrhosis).     Interim History:  Today's visit was conducted as non Epic video visit as part of COVID-19 action plan. Wife present during interviewing process today. He started to experience neurological changes over the past year and half. 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 He was started on trial of Lactulose 30 ml daily with noted improvement. Additionally started on Xifaxan 550 mg bid. Through discussion today, possible underlying neurological changes may have been associated or contributory factor of chronic sinusitis. As noted below under the care of ENT. Felt underlying cause of chronic sinusitis was bacterial. Today advised fungal infection. Pending upcoming scheduling of sinus surgery and follow up ENT mid November. He was suppose to have followed up with ENT already but exposed to COVID-19 and had to quarantine for 14 days, thus rescheduled. Screened twice -04/07/2019 and May 09, 2019, both occurrences negative.     PMH of prostate cancer and underwent radiation txment and Lupron injections. Under the care of Dr. Nicanor Alcon, urology oncology. Gleason 4+4, clinical stage [id number] prostate cancer diagnosed in 2/12 of his biopsy cores. His CAPRA score is 4, consistent with intermediate-risk disease.+ history of macular degeneration and on AREDS. + history of skin cancer.     During his last office visit he had been experiencing dysphagia involving both solid foods as well as liquids. Feeling of food bolus getting hung to oropharyngeal region. The occurs of choking has caused him to regurgitate twice. This can occur with solid foods as well as liquids. Denied pain with swallowing. Long standing history of tobacco use. PE revealed evidence of substantial thick white plaque like areas involving posterior pharynx. Referred to ENT and diagnosed with chronic sinusitis. Treated with aggressive antibiotic treatment and steroid therapy. Admitted [place] for community acquired pneumonia on 04/08/2019.     He had a colonoscopy 10/20/15 showing 1 small adenoma- 5 yr follow-up. EGD 08/2017 essentially unremarkable. Denies any jaundice, pruritis, SOB, CP, increased abdominal girth, LE edema, melena, BRBPR, confusion, nausea, vomiting, or diarrhea. Afebrile since txment pneumonia. Biggest complaint is chronic fatigue. He has lost some weight during recent illnesses and trying to gain weight back. Drinking two cans of Boost daily. Diet higher in protein intake.    Finding of two 3 mm pulmonary nodules on CT scan lung for lung cancer screening (07/11/2016) in the setting of history of tobacco abuse. CT scan of chest performed 07/21/2017 stable unchanged subcentimeter pulmonary nodules. Annual surveillance recommended. CT scan of chest 08/13/2018 - No lung nodules. Surveillance one year.  Echocardiogram 11/17/2017: Performed for indication of bradycardia.     Interpretation Summary     ?? Normal left ventricular systolic function, ejection fraction 60 to 65%  ?? Dilated left atrium - mild  ?? Normal right ventricular systolic function  ?? Tricuspid regurgitation - mild  ?? Mildly elevated right atrial pressure     External ECG-48 to 21 day findings:  Conclusion: ??1 run of non-sustained Ventricular Tachycardia occurred lasting 6 beats with a max rate of 112 bpm (avg 93 bpm). ??37 Supraventricular Tachycardia runs occurred, most consistent with paroxysmal atrial tachycardia; the run with the fastest interval lasting 13 beats with a max rate of 207 bpm, the longest lasting 8 beats with an avg rate of 104 bpm.    NM Myocardial perfusion spect multiple: 11/2018:   Normal myocardial perfusion study. No evidence for significant ischemia or scar is noted. Post stress: Global systolic function is hyperdynamic. The ejection fraction was greater than 65%. Coronary calcifications are noted. Mild bibasilar dependent atelectasis    Cirrhosis Care:  -- Varices screen: normal platelets and spleen size with Fibroscan < 25 kPa. No history of EGD.  -- HCC screen: MRI of abdomen 02/25/2019:   Stable LR-2 lesions, as above. No new hepatic lesions are identified. Spleen unremarkable.     -- Immunity hepatitis B.   -- Immunity hepatitis A: first vaccination given today.   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  -- OLT status: Not indicated at this time based on low MELD score  -- MELD score: 7, Child-Pugh: 5 , Class: A   MELD-Na score: 7 at 08/07/2018  9:37 AM  MELD score: 7 at 08/07/2018  9:37 AM  Calculated from:  Serum Creatinine: 0.94 mg/dL (Rounded to 1 mg/dL) at 96/29/5284  1:32 AM  Serum Sodium: 137 mmol/L at 08/07/2018  9:37 AM  Total Bilirubin: 0.5 mg/dL (Rounded to 1 mg/dL) at 44/08/270  5:36 AM  INR(ratio): 1.11 at 08/07/2018  9:37 AM  Age: 29 years 2 months    Stable score of 7 on 02/2019     -- Fibroscan 06/16/2014: 20.0 kPa/F4  --Chronic hepatitis C, genotype 1a  Pre-treatment HCV RNA:  14474 IU/mL  HCV RNA 08/23/2014 - TW #6/12 - non-detectable  HCV RNA 09/24/2014 - EOT- non-detectable  HCV RNA SVR 01/01/2015 - -not detected  HCV RNA 01/26/2017 - not detected    ROS:  All ten systems reviewed and negative except in HPI.     Allergies:   Allergies   Allergen Reactions   ??? Bicalutamide Rash   ??? Penicillins Rash   ??? Rocephin [Ceftriaxone] Rash     Patient received in Emergency Room and reported rash to stomach and back several hours later.      Medications:  Current Outpatient Medications   Medication Sig Dispense Refill   ??? albuterol HFA 90 mcg/actuation inhaler Inhale 2 puffs every four (4) hours as needed for wheezing. 3 Inhaler 3   ??? aspirin (ECOTRIN) 81 MG tablet Take 81 mg by mouth daily.     ??? atorvastatin (LIPITOR) 20 MG tablet Take 1 tablet (20 mg total) by mouth nightly. 90 tablet 3   ??? cholecalciferol, vitamin D3, (VITAMIN D3) 1,000 unit capsule Take 1,000 Units by mouth daily.     ??? cyanocobalamin (VITAMIN B-12) 250 MCG tablet Take 250 mcg by mouth daily.     ??? ferrous sulfate 325 mg (65 mg iron) CpER Take 1 tablet by mouth daily.      ??? fluticasone  propion-salmeterol (ADVAIR DISKUS) 250-50 mcg/dose diskus Inhale 1 puff Two (2) times a day. 3 each 3   ??? guaiFENesin (MUCINEX) 600 mg 12 hr tablet Take 600 mg by mouth every morning.     ??? lactulose (CHRONULAC) 10 gram/15 mL solution Take 10 g by mouth Two (2) times a day.      ??? loratadine (CLARITIN) 10 mg tablet Take 10 mg by mouth daily.     ??? multivitamin (TAB-A-VITE/THERAGRAN) per tablet Take 1 tablet by mouth daily.     ??? nicotine (NICODERM CQ) 21 mg/24 hr patch Place 1 patch on the skin daily. 1 nicotine patch (21 mg/day) daily for six weeks 42 patch 0   ??? nicotine polacrilex (NICORETTE) 4 MG gum Apply 1 each (4 mg total) to cheek every hour as needed for smoking cessation (max 24 pieces per day). 110 each 2   ??? rifAXIMin (XIFAXAN) 550 mg Tab Take 1 tablet (550 mg total) by mouth Two (2) times a day. 60 tablet 6   ??? SUBOXONE 8-2 mg sublingual film Place 1 Film under the tongue Two (2) times a day.   0   ??? tamsulosin (FLOMAX) 0.4 mg capsule Take 1 capsule (0.4 mg total) by mouth two (2) times a day. 90 capsule 3   ??? vit C,E-Zn-coppr-lutein-zeaxan (PRESERVISION AREDS 2) 250-200-40-1 mg-unit-mg-mg cap Take 1 tablet by mouth Two (2) times a day.     ??? vitamin A [address]109 UNIT capsule Take 10,000 Units by mouth daily.       No current facility-administered medications for this visit.      Active Ambulatory Problems     Diagnosis Date Noted   ??? Chronic headaches    ??? Renal calculus    ??? Tobacco use disorder 02/08/2013   ??? Nasal septum perforation 02/12/2013   ??? Insomnia 02/13/2013   ??? Other emphysema (CMS-HCC)    ??? Hepatitis C 03/05/2014   ??? 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 Proteinuria 09/28/2017   ??? Prostate cancer (CMS-HCC) 09/11/2018   ??? Cirrhosis (CMS-HCC) 03/14/2019   ??? Hepatic encephalopathy (CMS-HCC) 03/14/2019   ??? Community acquired pneumonia of right upper lobe of lung 04/07/2019     Resolved Ambulatory Problems     Diagnosis Date Noted   ??? Chest pain 02/07/2013   ??? GERD (gastroesophageal reflux disease) 02/08/2013   ??? Skin lesion 02/12/2013   ??? Sialolithiasis of submandibular gland 04/08/2015   ??? Rib pain on left side 06/16/2016   ??? Health care maintenance 06/16/2016   ??? Nonspecific L Lung micronodules, repeat LDCT due 07/12/2017 07/12/2016   ??? Atherosclerosis of coronary artery 08/16/2016   ??? Actinic keratosis 02/01/2017   ??? Elevated PSA 09/28/2017   ??? Benign localized hyperplasia of prostate with urinary obstruction 09/28/2017   ??? Abnormal prostate exam 09/28/2017     Past Medical History:   Diagnosis Date   ??? Asthma    ??? Colon polyp    ??? Enlarged prostate    ??? Hx of substance abuse (CMS-HCC)    ??? Infectious viral hepatitis    ??? Osteoporosis    ??? Skin cancer    ??? Tobacco abuse 02/08/2013     Past Surgical History:   Procedure Laterality Date   ??? CERVICAL FUSION      cage   ??? PR COLSC FLX W/RMVL OF TUMOR POLYP LESION SNARE TQ N/A 10/20/2015    Procedure: COLONOSCOPY FLEX; W/REMOV TUMOR/LES BY SNARE;  Surgeon: Annie Paras, MD;  [facility];  Service: Gastroenterology   ??? PR EXCISION SUBMAXILLARY GLAND Right 03/31/2015    Procedure: EXC SUBMANDIBULAR GLAND;  Surgeon: Lauralee Evener, MD;  Location: ASC OR [facility];  Service: ENT   ??? PR UPPER GI ENDOSCOPY,DIAGNOSIS N/A 08/18/2017    Procedure: UGI ENDO, INCLUDE ESOPHAGUS, STOMACH, & DUODENUM &/OR JEJUNUM; DX W/WO COLLECTION SPECIMN, BY BRUSH OR WASH;  Surgeon: Rona Ravens, MD;  [facility]: [facility];  Service: Gastroenterology     Family History   Problem Relation Age of Onset   ??? Heart attack Father    ??? Hypertension Father    ??? Lung cancer Mother    ??? Bone cancer Mother    ??? Hypertension Mother    ??? Hepatitis Sister       Social History     Socioeconomic History   ??? Marital status: Married     Spouse name: Not on file   ??? Number of children: Not on file   ??? Years of education: Not on file   ??? Highest education level: Not on file   Occupational History   ??? Not on file   Social Needs   ??? Financial resource strain: Not hard at all   ??? Food insecurity     Worry: Patient refused     Inability: Patient refused   ??? Transportation needs     Medical: Not on file     Non-medical: Not on file   Tobacco Use   ??? Smoking status: Light Tobacco Smoker     Packs/day: 0.25     Years: 50.00     Pack years: 12.50     Types: Cigarettes     Start date: 06/16/1969   ??? Smokeless tobacco: Former Neurosurgeon     Types: Chew     Quit date: 2009   ??? Tobacco comment: PATIENT CURRENTLY USING PATCH TO HELP WITH QUITTING   Substance and Sexual Activity   ??? Alcohol use: No     Alcohol/week: 0.0 standard drinks   ??? Drug use: Not Currently     Types: Cocaine, IV     Comment: History of cocaine use years ago (intranasal, IV and crack), marijuana use.    ??? Sexual activity: Yes     Partners: Female   Lifestyle   ??? Physical activity     Days per week: Not on file     Minutes per session: Not on file   ??? Stress: Not on file   Relationships   ??? Social Wellsite geologist on phone: Not on file     Gets together: Not on file     Attends religious service: Not on file     Active member of club or organization: Not on file     Attends meetings of clubs or organizations: Not on file     Relationship status: Not on file   Other Topics Concern   ??? Not on file   Social History Narrative   ??? Not on file     Physical Examination:   VS were not obtained today given nature of today's visit.   General appearance - Alert, no distress, oriented to person, place, and time. + temporal wasting. Very slender. Evidence of additional weight loss.  Mental status - Normal mood, behavior, speech, dress, motor activity, and thought processes. Very pleasant.   Eyes - pupils equal and reactive, sclera anicteric  Neck - Supple, no JVD  Neurological -  screening mental status exam normal. No evidence of asterixis  Skin - normal coloration. No evidence of palmar erythema.    Rest of PE deferred.     Laboratory Studies:  Future laboratory studies ordered under the care of [facility]. To be drawn same date of pre-operative labs. Orders entered.     Assessment/Plan:   [name] is a 63 y.o. Caucasian male with well compensated cirrhosis secondary to chronic HCV genotype 1a, treatment naive ~ cured. Chief complaint of today's non Epic video visit is continued cirrhosis care. + HE history which remains fairly well controlled, but today question if underlying etiological cause has been more infectious related. He will remain on lactulose and Xifaxan at this time. No history of an overt decompensated event such as upper GI bleed or ascites. Vianne Bulls is consistent with F4 disease. Continued evidence of good synthetic liver function. Stable MELD score of 7.      ~ Future MELD labs ordered.   ~ Office follow up end of January 2021  ~ No alcohol use.     HE: Continue lactulose 15 ml bid and Xifaxan 550 mg bid. Goal of two to three bowel movements daily.    HCC screening ~Up to date. MRI of abdomen end of January. + history of LRAD-2 lesions.     Osteoporosis evaluation ~Under the care of endocrinology at [facility].     Tobacco abuse, continued ~ Smoking cessation discussed again. He has been very successful with being able to cut down to three to four cigs daily. Goal quit smoking by upcoming sinus surgery.    Pulmonary nodules ~  CT scan of chest without contrast warranted annually.     Skin cancer ~ Under the care of dermatology.     Nutritional status ~ Continue with high caloric diet with continuation of at least two cans of Boost daily.     Variceal Screening~ Up to date. No history of varices. Surveillance 2022.     History of Bradycardia with evidence of VTach as noted above ~Under the care of cardiology.     History of prostate cancer ~ Under the care of oncology. He has successfully completed radiation treatment. Two more doses of Lupron.     Dysphagia, better. Newly diagnosed with chronic sinusitis (fungal): Under the care of ENT.     All patient's questions as well as his wife's were answered to their satisfaction during visit today.     I spent 23 minutes on the real-time audio and video with the patient. I spent an additional 16 minutes on pre- and post-visit activities.     The patient was not located and I was not located within 250 yards of the clinic or medical center during the video or telephone visit. The patient was physically located in [place] or a state in which I am permitted to provide care. The patient and/or parent/guardian understood that s/he may incur co-pays and cost sharing, and agreed to the telemedicine visit. The visit was reasonable and appropriate under the circumstances given the patient's presentation at the time.    The patient and/or parent/guardian has been advised of the potential risks and limitations of this mode of treatment (including, but not limited to, the absence of  in-person examination) and has agreed to be treated using telemedicine. The patient's/patient's family's questions regarding telemedicine have been answered. If the visit was completed in an ambulatory setting, the patient and/or parent/guardian has also been advised to contact their provider's office for worsening  conditions, and seek emergency medical treatment and/or call 911 if the patient deems either necessary    Rodman Key, DNP, FNP-BC  [facility]  [address]  [place] [address]  Phone [phone]

## 2019-05-29 ENCOUNTER — Institutional Professional Consult (permissible substitution): Admit: 2019-05-29 | Discharge: 2019-05-30 | Payer: PRIVATE HEALTH INSURANCE

## 2019-05-29 DIAGNOSIS — C61 Malignant neoplasm of prostate: Principal | ICD-10-CM

## 2019-05-29 MED ORDER — ELIGARD 22.5 MG (3 MONTH) SUBCUTANEOUS SYRINGE: 23 mg | Syringe | Freq: Once | 0 refills | 1 days | Status: AC

## 2019-05-29 NOTE — Unmapped (Signed)
Pt's port accessed - flushed, bld return noted, labs drawn and heplocked. Pt tolerated well.

## 2019-06-13 NOTE — Unmapped (Signed)
[facility] Refill Coordination Note    Specialty Medication(s) to be Shipped:   Infectious Disease: Xifaxan    Other medication(s) to be shipped: n/a     Harold Richards, DOB: [birth year]  Phone: [phone] (home)       All above HIPAA information was verified with patient.     Completed refill call assessment today to schedule patient's medication shipment from the [facility] [phone]).       Specialty medication(s) and dose(s) confirmed: Regimen is correct and unchanged.   Changes to medications: [name] reports no changes at this time.  Changes to insurance: No  Questions for the pharmacist: No    Confirmed patient received Welcome Packet with first shipment. The patient will receive a drug information handout for each medication shipped and additional FDA Medication Guides as required.       DISEASE/MEDICATION-SPECIFIC INFORMATION        N/A    SPECIALTY MEDICATION ADHERENCE     Medication Adherence    Patient reported X missed doses in the last month: 0  Specialty Medication: xifaxan 550mg           Patient unable to confirm quantity on hand of xifaxan at this time.      SHIPPING     Shipping address confirmed in Epic.     Delivery Scheduled: Yes, Expected medication delivery date: 11/10.     Medication will be delivered via Next Day Courier to the prescription address in Epic WAM.    [name]   [facility] Specialty Technician

## 2019-06-17 MED FILL — XIFAXAN 550 MG TABLET: ORAL | 30 days supply | Qty: 60 | Fill #4

## 2019-06-17 MED FILL — XIFAXAN 550 MG TABLET: 30 days supply | Qty: 60 | Fill #4 | Status: AC

## 2019-06-20 ENCOUNTER — Ambulatory Visit
Admit: 2019-06-20 | Discharge: 2019-06-21 | Payer: PRIVATE HEALTH INSURANCE | Attending: Student in an Organized Health Care Education/Training Program | Primary: Student in an Organized Health Care Education/Training Program

## 2019-06-20 NOTE — Unmapped (Addendum)
Otolaryngology Clinic Note    Harold Richards is a 63 y.o. male is seen in consultation at the request of Rupali Harvie Bridge  for evaluation of left-sided maxillary mass.       History of Present Illness:     The patient is a 63 y.o. male who  has a past medical history of Asthma, Chronic headaches, Colon polyp, Enlarged prostate, GERD (gastroesophageal reflux disease) (02/08/2013), Hepatitis C, substance abuse (CMS-HCC), Infectious viral hepatitis, Lung nodule seen on imaging study (07/12/2016), Osteoporosis, Other emphysema (CMS-HCC), Renal calculus, Skin cancer, and Tobacco abuse (02/08/2013). who presents for the evaluation of chronic sinusitis.  Patient reports left sided facial pain and pressure for >1 year.  Was seen by Dr. Sherryll Burger on 7/28 and was incidental noted to have purulence from an inflammed left maxillary/ethomid region.  He was treated with 14 days of antibotics (doxy) and scheduled to have a CT scan.  Prior to the CT he was admitted to the hospital on 8/30 with a community aquired pneumonia, fevers and SOB.   He was treated with IV cephalosporins and transitioned to PO levaquin.  Following this he had his originally scheduled CT and f/u with Dr. Sherryll Burger on 9/21, despite 2 course of antibiotics he still have persistnet purulence and CT scan demonstrated left maxillary opacification concerning for a mass, extending into the anterior ethmoid.  CT scan is concerning for fungal ball or inverted papillooma given the asymmetry.  Reports his left nose from a breathing stand point is bad.  Complain of persistent headaches and some upper teeth pain (edentulous but pain on the alveolous) that all seem to be left-sided.     Still having some trouble with swallowing and choking and has seen Dr. Martina Sinner.   Has not used any steroid or rinses.  Sense of smell/taste has declined.  Denies any recent antibiotics within the last month.        Patient works for [employer]    Patients medical records were personally reviewed. A 12 point review of systems was negative except as indicated.  The patient denies fevers, chills, shortness of breath, chest pain, nausea, vomiting, diarrhea, inability to lie flat, dysphagia, odynophagia, hemoptysis, hematemesis, changes in vision, changes in voice quality, otalgia, otorrhea, vertiginous symptoms, focal deficits, or other concerning symptoms.    Past Medical History     has a past medical history of Asthma, Chronic headaches, Colon polyp, Enlarged prostate, GERD (gastroesophageal reflux disease) (02/08/2013), Hepatitis C, substance abuse (CMS-HCC), Infectious viral hepatitis, Lung nodule seen on imaging study (07/12/2016), Osteoporosis, Other emphysema (CMS-HCC), Renal calculus, Skin cancer, and Tobacco abuse (02/08/2013).    Past Surgical History     has a past surgical history that includes Cervical fusion; pr excision submaxillary gland (Right, 03/31/2015); pr colsc flx w/rmvl of tumor polyp lesion snare tq (N/A, 10/20/2015); and pr upper gi endoscopy,diagnosis (N/A, 08/18/2017).    Current Medications    Current Outpatient Medications   Medication Sig Dispense Refill   ??? albuterol HFA 90 mcg/actuation inhaler Inhale 2 puffs every four (4) hours as needed for wheezing. 3 Inhaler 3   ??? aspirin (ECOTRIN) 81 MG tablet Take 81 mg by mouth daily.     ??? atorvastatin (LIPITOR) 20 MG tablet Take 1 tablet (20 mg total) by mouth nightly. 90 tablet 3   ??? cholecalciferol, vitamin D3, (VITAMIN D3) 1,000 unit capsule Take 1,000 Units by mouth daily.     ??? cyanocobalamin (VITAMIN B-12) 250 MCG tablet Take 250 mcg by  mouth daily.     ??? ferrous sulfate 325 mg (65 mg iron) CpER Take 1 tablet by mouth daily.      ??? fluticasone propion-salmeterol (ADVAIR DISKUS) 250-50 mcg/dose diskus Inhale 1 puff Two (2) times a day. 3 each 3   ??? guaiFENesin (MUCINEX) 600 mg 12 hr tablet Take 600 mg by mouth every morning.     ??? lactulose (CHRONULAC) 10 gram/15 mL solution Take 10 g by mouth Two (2) times a day. ??? loratadine (CLARITIN) 10 mg tablet Take 10 mg by mouth daily.     ??? multivitamin (TAB-A-VITE/THERAGRAN) per tablet Take 1 tablet by mouth daily.     ??? nicotine polacrilex (NICORETTE) 4 MG gum Apply 1 each (4 mg total) to cheek every hour as needed for smoking cessation (max 24 pieces per day). 110 each 2   ??? rifAXIMin (XIFAXAN) 550 mg Tab Take 1 tablet (550 mg total) by mouth Two (2) times a day. 60 tablet 6   ??? SUBOXONE 8-2 mg sublingual film Place 1 Film under the tongue Two (2) times a day.   0   ??? tamsulosin (FLOMAX) 0.4 mg capsule Take 1 capsule (0.4 mg total) by mouth two (2) times a day. 90 capsule 3   ??? vit C,E-Zn-coppr-lutein-zeaxan (PRESERVISION AREDS 2) 250-200-40-1 mg-unit-mg-mg cap Take 1 tablet by mouth Two (2) times a day.     ??? vitamin A 29562 UNIT capsule Take 10,000 Units by mouth daily.       No current facility-administered medications for this visit.        Allergies    Allergies   Allergen Reactions   ??? Bicalutamide Rash   ??? Penicillins Rash   ??? Rocephin [Ceftriaxone] Rash     Patient received in Emergency Room and reported rash to stomach and back several hours later.        Family History    Negative for bleeding disorders or free bleeding.     family history includes Bone cancer in his mother; Heart attack in his father; Hepatitis in his sister; Hypertension in his father and mother; Lung cancer in his mother.    Social History:     reports that he has been smoking cigarettes. He started smoking about 50 years ago. He has a 12.50 pack-year smoking history. He quit smokeless tobacco use about 11 years ago.  His smokeless tobacco use included chew.   reports no history of alcohol use.   reports previous drug use. Drugs: Cocaine and IV.    Review of Systems  A 12 system review of systems was performed and is negative other than that noted in the history of present illness.    Vital Signs  There were no vitals taken for this visit.    Physical Exam General: Well-developed, well-nourished. Appropriate, comfortable, and in no apparent distress.  Head/Face: On external examination there is no obvious asymmetry or scars. On palpation there is no tenderness over maxillary sinuses or masses within the salivary glands. Cranial nerves V and VII are intact through all distributions.  Eyes: PERRL, EOMI, the conjunctiva are not injected and sclera is non-icteric.  Ears: On external exam, there is no obvious lesions or asymmetry. The EACs are bilaterally without cerumen or lesions. The TMs are in the neutral position and are mobile to pneumatic otoscopy bilaterally. There are no middle ear masses or fluid noted. Hearing is grossly intact bilaterally.  Nose: On external exam there are neither lesions nor asymmetry of the nasal tip/  dorsum. On anterior rhinoscopy, visualization posteriorly is limited on anterior examination. For this reason, to adequately evaluate posteriorly for masses, polypoid disease and/or signs of infections, nasal endoscopy is indicated (see procedure below).  Oral cavity/oropharynx: The mucosa of the lips, gums, hard and soft palate, posterior pharyngeal wall, tongue, floor of mouth, and buccal region are without masses or lesions and are normally hydrated. Good dentition. Tongue protrudes midline. Tonsils are normal appearing. Supraglottis not visualized due to gag reflex.  Neck: There is no asymmetry or masses. Trachea is midline. There is no enlargement of the thyroid or palpable thyroid nodules.   Lymphatics: There is no palpable lymphadenopathy along the jugulodiagastric, submental, or posterior cervical chains.  Chest: No audible wheeze, unlabored respirations.  Cardiovascular: Regular rate.  GI: Nondistended.  Neurologic: Cranial nerve???s II-XII are grossly intact. Exam is non-focal.  Extremities: No cyanosis, clubbing or edema.    Procedures:  Diagnostic Bilateral Nasal Endoscopy (CPT [phone]) NOTE: Nasal endoscopy is performed for the sinuses only, and not for examination of the skull base, septum or inferior turbinates, nor is it related to any previously performed septoplasty or inferior turbinate surgery or skull base surgery.     Surgeon: Egbert Garibaldi, MD  Anesthesia: none  Procedure Detail:  As a result of inability to visualize the intranasal anatomy, and after discussion of the potential risks related to the procedure (primarily bleeding), a endoscope is used to examine the left and right sinonasal cavities, including the interior of the nasal cavity and the middle and superior meatus, the turbinates, and the spheno-ethmoid recess. All these areas were inspected.    Findings:    Examination on the left reveals mucopurulence coming from the left sided maxillary sinus.  Uncinate is severely inflamed and reflected out.   Spheno ethmoid recess is normal.    Examination on the right reveals an intact nasal septum with no associated masses, lesions, or friable mucosa. The right middle meatus, sphenoethmoidal recess, and skull base are clear with no evidence of purulence, polyposis, or polypoid edema.      Oretha Ellis Nasal Endoscopy Score: [employer] is used to assess the degree of inflammation of the sinonasal structures, including the middle and superior turbinates, the ethmoid sinuses, maxillary sinuses, frontal sinuses, and sphenoid sinuses.  In the presence of previous surgery, some or all of these structures may be absent.    Left        ?? Polyps:  Absent (0)   ?? Edema:   Severe (2)   ?? Discharge:  Thick, Purulent (2)    ?? Scarring:  Absent (0)   ?? Crusting:  None (0)      Total Left:  4     Right         ?? Polyps:  Absent (0)   ?? Edema:  Absent (0)   ?? Discharge: None (0)    ?? Scarring:  Absent (0)   ?? Crusting:  None (0)      Total Right:   0      Labs and Diagnostic Tests CT scan was reviewed demonstrates left-sided maxillary opacification edentulous on the left. Osteitic change within the maxillary sinus concerning for a tumor either benign or malignant.        Assessment:  The patient is a 63 y.o. male who  has a past medical history of Asthma, Chronic headaches, Colon polyp, Enlarged prostate, GERD (gastroesophageal reflux disease) (02/08/2013), Hepatitis C, substance abuse (CMS-HCC), Infectious viral hepatitis,  Lung nodule seen on imaging study (07/12/2016), Osteoporosis, Other emphysema (CMS-HCC), Renal calculus, Skin cancer, and Tobacco abuse (02/08/2013). who presents for the evaluation of: Left-sided maxillary tumor.      Recommendations:  1.  Patient with maxillary mass and asymmetry on the left side with complete opacification.  This lesion has persisted despite prolonged course of antibiotics including IV antibiotics as well as a 14 day course of doxycycline.  Discussed management this juncture including an additional course of antibiotics versus surgery.  As well as concern this may be something more than sinusitis like an inverted papilloma.   Discussed a left-sided maxillary antrostomy vs medial maxillectomy as well as left sided sinus surgery.  He wished to proceed with this at his earliest convenience.      The procedure itself was discussed at length. The risks, benefits, and alternatives were explained in detail which include but is not limited to: anesthesia, pain, loss of taste/smell, bleeding, infection, need for nasal packing, double vision, vision loss, CSF leak requiring other procedures to repair, numbness of teeth/and or face, need for revision surgery, brain/skull base injury, and unexpected risks. The patient understands these risks and is willing to proceed with surgery. All questions were answered.      The patient voiced complete understanding of plan as detailed above and is in full agreement.

## 2019-06-24 DIAGNOSIS — J329 Chronic sinusitis, unspecified: Principal | ICD-10-CM

## 2019-06-25 NOTE — Unmapped (Signed)
Phone call pt to schedule Anes Eval  12/11@11am  to follow covid testing  Pt in agreement    On the day of your appointment:    Please arrive at least 15 minutes prior to your appointment  Please eat and drink as normal  Please bring all your medications     We are located at [facility]:      [facility] Pre-Procedure Services at [facility]  [address]   [place], [place] [address]    [phone]

## 2019-07-10 NOTE — Unmapped (Signed)
[facility] Refill Coordination Note    Specialty Medication(s) to be Shipped:   Infectious Disease: Xifaxan    Other medication(s) to be shipped: n/a     [name], DOB: Feb 01, 1956  Phone: [phone] (home)       All above HIPAA information was verified with patient.     Completed refill call assessment today to schedule patient's medication shipment from the [facility] [phone]).       Specialty medication(s) and dose(s) confirmed: Regimen is correct and unchanged.   Changes to medications: [name] reports no changes at this time.  Changes to insurance: No  Questions for the pharmacist: No    Confirmed patient received Welcome Packet with first shipment. The patient will receive a drug information handout for each medication shipped and additional FDA Medication Guides as required.       DISEASE/MEDICATION-SPECIFIC INFORMATION        N/A    SPECIALTY MEDICATION ADHERENCE     Medication Adherence    Patient reported X missed doses in the last month: 0  Specialty Medication: xifaxan 550mg         Patient not at home; unsure of quantity on hand of Xifaxan.      SHIPPING     Shipping address confirmed in Epic.     Delivery Scheduled: Yes, Expected medication delivery date: 12/9.     Medication will be delivered via Next Day Courier to the prescription address in Epic WAM.    [name]   [facility] Specialty Technician

## 2019-07-16 MED FILL — XIFAXAN 550 MG TABLET: ORAL | 30 days supply | Qty: 60 | Fill #5

## 2019-07-16 MED FILL — XIFAXAN 550 MG TABLET: 30 days supply | Qty: 60 | Fill #5 | Status: AC

## 2019-07-19 ENCOUNTER — Encounter: Admit: 2019-07-19 | Discharge: 2019-07-19 | Payer: PRIVATE HEALTH INSURANCE

## 2019-07-19 ENCOUNTER — Encounter
Admit: 2019-07-19 | Discharge: 2019-07-19 | Payer: PRIVATE HEALTH INSURANCE | Attending: Nurse Practitioner | Primary: Nurse Practitioner

## 2019-07-19 DIAGNOSIS — K769 Liver disease, unspecified: Principal | ICD-10-CM

## 2019-07-19 DIAGNOSIS — K7460 Unspecified cirrhosis of liver: Principal | ICD-10-CM

## 2019-07-19 DIAGNOSIS — J329 Chronic sinusitis, unspecified: Principal | ICD-10-CM

## 2019-07-19 DIAGNOSIS — F17200 Nicotine dependence, unspecified, uncomplicated: Principal | ICD-10-CM

## 2019-07-19 DIAGNOSIS — Z01818 Encounter for other preprocedural examination: Principal | ICD-10-CM

## 2019-07-19 LAB — BASIC METABOLIC PANEL
ANION GAP: 7 mmol/L (ref 7–15)
BLOOD UREA NITROGEN: 24 mg/dL — ABNORMAL HIGH (ref 7–21)
BUN / CREAT RATIO: 29
CALCIUM: 9.7 mg/dL (ref 8.5–10.2)
CHLORIDE: 107 mmol/L (ref 98–107)
CO2: 27 mmol/L (ref 22.0–30.0)
CREATININE: 0.83 mg/dL (ref 0.70–1.30)
EGFR CKD-EPI NON-AA MALE: >90 mL/min/{1.73_m2} (ref >=60)
GLUCOSE RANDOM: 88 mg/dL (ref 70–179)
POTASSIUM: 4.6 mmol/L (ref 3.5–5.0)
SODIUM: 141 mmol/L (ref 135–145)

## 2019-07-19 LAB — CBC W/ AUTO DIFF
BASOPHILS ABSOLUTE COUNT: 0 10*9/L (ref 0.0–0.1)
BASOPHILS RELATIVE PERCENT: 0.6 %
EOSINOPHILS ABSOLUTE COUNT: 0.1 10*9/L (ref 0.0–0.4)
EOSINOPHILS RELATIVE PERCENT: 1.6 %
HEMATOCRIT: 41.2 % (ref 41.0–53.0)
LARGE UNSTAINED CELLS: 3 % (ref 0–4)
LYMPHOCYTES ABSOLUTE COUNT: 1.4 10*9/L — ABNORMAL LOW (ref 1.5–5.0)
MEAN CORPUSCULAR HEMOGLOBIN CONC: 32.7 g/dL (ref 31.0–37.0)
MEAN CORPUSCULAR HEMOGLOBIN: 35 pg — ABNORMAL HIGH (ref 26.0–34.0)
MEAN CORPUSCULAR VOLUME: 107 fL — ABNORMAL HIGH (ref 80.0–100.0)
MONOCYTES ABSOLUTE COUNT: 0.4 10*9/L (ref 0.2–0.8)
MONOCYTES RELATIVE PERCENT: 6.2 %
NEUTROPHILS ABSOLUTE COUNT: 3.7 10*9/L (ref 2.0–7.5)
NEUTROPHILS RELATIVE PERCENT: 64.5 %
PLATELET COUNT: 223 10*9/L (ref 150–440)
RED CELL DISTRIBUTION WIDTH: 12.7 % (ref 12.0–15.0)
WBC ADJUSTED: 5.7 10*9/L (ref 4.5–11.0)

## 2019-07-19 LAB — PROTEIN TOTAL: Protein:MCnc:Pt:Ser/Plas:Qn:: 7.4

## 2019-07-19 LAB — EOSINOPHILS RELATIVE PERCENT: Eosinophils/100 leukocytes:NFr:Pt:Bld:Qn:Automated count: 1.6

## 2019-07-19 LAB — PROTIME-INR: INR: 1.14

## 2019-07-19 LAB — HEPATIC FUNCTION PANEL
ALT (SGPT): 21 U/L (ref <50)
AST (SGOT): 32 U/L (ref 19–55)
BILIRUBIN DIRECT: 0.1 mg/dL (ref 0.00–0.40)
PROTEIN TOTAL: 7.4 g/dL (ref 6.5–8.3)

## 2019-07-19 LAB — SMEAR REVIEW

## 2019-07-19 LAB — BLOOD UREA NITROGEN: Urea nitrogen:MCnc:Pt:Ser/Plas:Qn:: 24 — ABNORMAL HIGH

## 2019-07-19 LAB — INR: Coagulation tissue factor induced.INR:RelTime:Pt:PPP:Qn:Coag: 1.14

## 2019-07-19 NOTE — Unmapped (Signed)
Per Anesthesia's guidelines:    Please take the following medications the morning of your procedure with a sip of water:      albuterol inhaler ( if needed )  advair diskus  Rifaximin  suboxone film    No food, milk, gum or candy after midnight night before surgery.   May have clear liquids up to 2 hours before arrival time to be at hospital.   These include: Water                          Apple juice                          Pedialyte                          Gatorade                          Black coffee                          Soda

## 2019-07-19 NOTE — Unmapped (Signed)
COVID Pre-Procedure Intake Form     Assessment     Harold Richards is a 63 y.o. male presenting to [facility] for COVID testing.     Plan     If no testing performed, pt counseled on routine care for respiratory illness.  If testing performed, COVID sent.  Patient directed to Home given findings during today's visit.    Subjective     Harold Richards is a 63 y.o. male who [facility] with complaints of the following:    Exposure History: In the last 21 days?     Have you traveled outside of [place]? No               Have you been in close contact with someone confirmed by a test to have COVID? (Close contact is within 6 feet for at least 10 minutes) No       Have you worked in a health care facility? No     Lived or worked facility like a nursing home, group home, or assisted living?    No         Are you scheduled to have surgery or a procedure in the next 3 days? Yes               Are you scheduled to receive cancer chemotherapy within the next 7 days?    No     Have you ever been tested before for COVID-19 with a swab of your nose? Yes: When: n/a, Where: n/a   Are you a healthcare worker being tested so to return to work No     Right now,  do you have any of the following that developed over the past 7 days (as stated by patient on intake form):    Subjective fever (felt feverish) No   Chills (especially repeated shaking chills) No   Severe fatigue (felt very tired) No   Muscle aches No   Runny nose No   Sore throat No   Loss of taste or smell No   Cough (new onset or worsening of chronic cough) No   Shortness of breath No   Nausea or vomiting No   Headache No   Abdominal Pain No   Diarrhea (3 or more loose stools in last 24 hours) No Scribe's Attestation: Laqueta Carina, FNP obtained and performed the history, physical exam and medical decision making elements that were entered into the chart.  Signed by Salomon Mast serving as Scribe, on 07/19/2019 10:14 AM      The documentation recorded by the scribe accurately reflects the service I personally performed and the decisions made by me. Matt Holmes, DNP, AGACNP-BC, NP-C  July 19, 2019 11:00 AM

## 2019-07-22 ENCOUNTER — Encounter
Admit: 2019-07-22 | Discharge: 2019-07-22 | Payer: PRIVATE HEALTH INSURANCE | Attending: Anesthesiology | Primary: Anesthesiology

## 2019-07-22 ENCOUNTER — Encounter: Admit: 2019-07-22 | Discharge: 2019-07-22 | Payer: PRIVATE HEALTH INSURANCE

## 2019-07-22 MED ORDER — DOXYCYCLINE HYCLATE 100 MG TABLET
ORAL_TABLET | Freq: Two times a day (BID) | ORAL | 0 refills | 14.00000 days | Status: CP
Start: 2019-07-22 — End: 2019-08-05
  Filled 2019-07-22: qty 28, 14d supply, fill #0

## 2019-07-22 MED ORDER — OXYCODONE 5 MG TABLET
ORAL_TABLET | ORAL | 0 refills | 3.00000 days | Status: CP | PRN
Start: 2019-07-22 — End: 2019-07-22

## 2019-07-22 MED ORDER — ONDANSETRON 4 MG DISINTEGRATING TABLET
ORAL_TABLET | Freq: Three times a day (TID) | ORAL | 0 refills | 4.00000 days | Status: CP | PRN
Start: 2019-07-22 — End: 2019-08-21
  Filled 2019-07-22: qty 10, 3d supply, fill #0

## 2019-07-22 MED ORDER — OXYCODONE 5 MG TABLET: 5 mg | tablet | 0 refills | 3 days | Status: AC

## 2019-07-22 MED ADMIN — ondansetron (ZOFRAN) injection: INTRAVENOUS | @ 21:00:00 | Stop: 2019-07-22

## 2019-07-22 MED ADMIN — balanced salt irrigation solution (BSS) ophthalmic irrigation solution: TOPICAL | @ 21:00:00 | Stop: 2019-07-22

## 2019-07-22 MED ADMIN — clindamycin (CLEOCIN) 900 mg/50 mL IVPB 900 mg: 900 mg | INTRAVENOUS | @ 20:00:00 | Stop: 2019-07-22

## 2019-07-22 MED ADMIN — midazolam (VERSED) injection: INTRAVENOUS | @ 20:00:00 | Stop: 2019-07-22

## 2019-07-22 MED ADMIN — fentaNYL (PF) (SUBLIMAZE) injection: INTRAVENOUS | @ 20:00:00 | Stop: 2019-07-22

## 2019-07-22 MED ADMIN — phenylephrine 20 mg in sodium chloride 0.9% 250 mL (80 mcg/mL) infusion PMB: INTRAVENOUS | @ 20:00:00 | Stop: 2019-07-22

## 2019-07-22 MED ADMIN — EPINEPHrine (ADRENALIN) injection: TOPICAL | @ 20:00:00 | Stop: 2019-07-22

## 2019-07-22 MED ADMIN — succinylcholine (ANECTINE) injection: INTRAVENOUS | @ 20:00:00 | Stop: 2019-07-22

## 2019-07-22 MED ADMIN — ketamine (KETALAR) injection: INTRAVENOUS | @ 20:00:00 | Stop: 2019-07-22

## 2019-07-22 MED ADMIN — lactated Ringers infusion: 10 mL/h | INTRAVENOUS | @ 20:00:00 | Stop: 2019-07-22

## 2019-07-22 MED ADMIN — propofoL (DIPRIVAN) injection: INTRAVENOUS | @ 20:00:00 | Stop: 2019-07-22

## 2019-07-22 MED ADMIN — acetaminophen (TYLENOL) tablet 1,000 mg: 1000 mg | ORAL | @ 18:00:00 | Stop: 2019-07-22

## 2019-07-22 MED ADMIN — celecoxib (CeleBREX) capsule 200 mg: 200 mg | ORAL | @ 18:00:00 | Stop: 2019-07-22

## 2019-07-22 MED ADMIN — lidocaine (XYLOCAINE) 20 mg/mL (2 %) injection: INTRAVENOUS | @ 20:00:00 | Stop: 2019-07-22

## 2019-07-22 MED ADMIN — sodium chloride irrigation (NS) 0.9 % irrigation solution: @ 20:00:00 | Stop: 2019-07-22

## 2019-07-22 MED ADMIN — ePHEDrine (PF) 25 mg/5 mL (5 mg/mL) in 0.9% sodium chloride syringe Syrg: INTRAVENOUS | @ 20:00:00 | Stop: 2019-07-22

## 2019-07-22 MED ADMIN — dexamethasone (DECADRON) 4 mg/mL injection: INTRAVENOUS | @ 20:00:00 | Stop: 2019-07-22

## 2019-07-22 MED ADMIN — oxymetazoline (AFRIN) 0.05 % nasal spray: TOPICAL | @ 20:00:00 | Stop: 2019-07-22

## 2019-07-22 MED FILL — ONDANSETRON 4 MG DISINTEGRATING TABLET: 3 days supply | Qty: 10 | Fill #0 | Status: AC

## 2019-07-22 MED FILL — DOXYCYCLINE HYCLATE 100 MG TABLET: 14 days supply | Qty: 28 | Fill #0 | Status: AC

## 2019-07-22 NOTE — Unmapped (Signed)
Otolaryngology Clinic Note  ??  Harold Richards is a 63 y.o. male is seen in consultation at the request of Rupali Harvie Bridge  for evaluation of left-sided maxillary mass.   ??  ??  ??  History of Present Illness:     ??  The patient is a 63 y.o. male who  has a past medical history of Asthma, Chronic headaches, Colon polyp, Enlarged prostate, GERD (gastroesophageal reflux disease) (02/08/2013), Hepatitis C, substance abuse (CMS-HCC), Infectious viral hepatitis, Lung nodule seen on imaging study (07/12/2016), Osteoporosis, Other emphysema (CMS-HCC), Renal calculus, Skin cancer, and Tobacco abuse (02/08/2013). who presents for the evaluation of chronic sinusitis.  Patient reports left sided facial pain and pressure for >1 year.  Was seen by Dr. Sherryll Burger on 7/28 and was incidental noted to have purulence from an inflammed left maxillary/ethomid region.  He was treated with 14 days of antibotics (doxy) and scheduled to have a CT scan.  Prior to the CT he was admitted to the hospital on 8/30 with a community aquired pneumonia, fevers and SOB.   He was treated with IV cephalosporins and transitioned to PO levaquin.  Following this he had his originally scheduled CT and f/u with Dr. Sherryll Burger on 9/21, despite 2 course of antibiotics he still have persistnet purulence and CT scan demonstrated left maxillary opacification concerning for a mass, extending into the anterior ethmoid.  CT scan is concerning for fungal ball or inverted papillooma given the asymmetry.  Reports his left nose from a breathing stand point is bad.  Complain of persistent headaches and some upper teeth pain (edentulous but pain on the alveolous) that all seem to be left-sided.   ??  Still having some trouble with swallowing and choking and has seen Dr. Martina Sinner.   Has not used any steroid or rinses.  Sense of smell/taste has declined.  Denies any recent antibiotics within the last month.    ??  ??  Patient works for [employer] Update 07/22/19: Patient presents for surgery. No changes since last visit.    Patients medical records were personally reviewed.    ??  A 12 point review of systems was negative except as indicated.  The patient denies fevers, chills, shortness of breath, chest pain, nausea, vomiting, diarrhea, inability to lie flat, dysphagia, odynophagia, hemoptysis, hematemesis, changes in vision, changes in voice quality, otalgia, otorrhea, vertiginous symptoms, focal deficits, or other concerning symptoms.  ??  Past Medical History  ??   has a past medical history of Asthma, Chronic headaches, Colon polyp, Enlarged prostate, GERD (gastroesophageal reflux disease) (02/08/2013), Hepatitis C, substance abuse (CMS-HCC), Infectious viral hepatitis, Lung nodule seen on imaging study (07/12/2016), Osteoporosis, Other emphysema (CMS-HCC), Renal calculus, Skin cancer, and Tobacco abuse (02/08/2013).  ??  Past Surgical History  ??   has a past surgical history that includes Cervical fusion; pr excision submaxillary gland (Right, 03/31/2015); pr colsc flx w/rmvl of tumor polyp lesion snare tq (N/A, 10/20/2015); and pr upper gi endoscopy,diagnosis (N/A, 08/18/2017).  ??  Current Medications  ??  Current Medications          Current Outpatient Medications   Medication Sig Dispense Refill   ??? albuterol HFA 90 mcg/actuation inhaler Inhale 2 puffs every four (4) hours as needed for wheezing. 3 Inhaler 3   ??? aspirin (ECOTRIN) 81 MG tablet Take 81 mg by mouth daily. ?? ??   ??? atorvastatin (LIPITOR) 20 MG tablet Take 1 tablet (20 mg total) by mouth nightly. 90 tablet 3   ???  cholecalciferol, vitamin D3, (VITAMIN D3) 1,000 unit capsule Take 1,000 Units by mouth daily. ?? ??   ??? cyanocobalamin (VITAMIN B-12) 250 MCG tablet Take 250 mcg by mouth daily. ?? ??   ??? ferrous sulfate 325 mg (65 mg iron) CpER Take 1 tablet by mouth daily.  ?? ??   ??? fluticasone propion-salmeterol (ADVAIR DISKUS) 250-50 mcg/dose diskus Inhale 1 puff Two (2) times a day. 3 each 3 ??? guaiFENesin (MUCINEX) 600 mg 12 hr tablet Take 600 mg by mouth every morning. ?? ??   ??? lactulose (CHRONULAC) 10 gram/15 mL solution Take 10 g by mouth Two (2) times a day.  ?? ??   ??? loratadine (CLARITIN) 10 mg tablet Take 10 mg by mouth daily. ?? ??   ??? multivitamin (TAB-A-VITE/THERAGRAN) per tablet Take 1 tablet by mouth daily. ?? ??   ??? nicotine polacrilex (NICORETTE) 4 MG gum Apply 1 each (4 mg total) to cheek every hour as needed for smoking cessation (max 24 pieces per day). 110 each 2   ??? rifAXIMin (XIFAXAN) 550 mg Tab Take 1 tablet (550 mg total) by mouth Two (2) times a day. 60 tablet 6   ??? SUBOXONE 8-2 mg sublingual film Place 1 Film under the tongue Two (2) times a day.  ?? 0   ??? tamsulosin (FLOMAX) 0.4 mg capsule Take 1 capsule (0.4 mg total) by mouth two (2) times a day. 90 capsule 3   ??? vit C,E-Zn-coppr-lutein-zeaxan (PRESERVISION AREDS 2) 250-200-40-1 mg-unit-mg-mg cap Take 1 tablet by mouth Two (2) times a day. ?? ??   ??? vitamin A 16109 UNIT capsule Take 10,000 Units by mouth daily. ?? ??   ??  No current facility-administered medications for this visit.       ??  ??  Allergies  ??        Allergies   Allergen Reactions   ??? Bicalutamide Rash   ??? Penicillins Rash   ??? Rocephin [Ceftriaxone] Rash   ?? ?? Patient received in Emergency Room and reported rash to stomach and back several hours later.    ??  ??  Family History  ??  Negative for bleeding disorders or free bleeding.   ??  family history includes Bone cancer in his mother; Heart attack in his father; Hepatitis in his sister; Hypertension in his father and mother; Lung cancer in his mother.  ??  Social History:  ??   reports that he has been smoking cigarettes. He started smoking about 50 years ago. He has a 12.50 pack-year smoking history. He quit smokeless tobacco use about 11 years ago.  His smokeless tobacco use included chew.   reports no history of alcohol use.   reports previous drug use. Drugs: Cocaine and IV.  ??  Review of Systems A 12 system review of systems was performed and is negative other than that noted in the history of present illness.  ??  Vital Signs  There were no vitals taken for this visit.  ??  Physical Exam  General: Well-developed, well-nourished. Appropriate, comfortable, and in no apparent distress.  Head/Face: On external examination there is no obvious asymmetry or scars. On palpation there is no tenderness over maxillary sinuses or masses within the salivary glands. Cranial nerves V and VII are intact through all distributions.  Eyes: PERRL, EOMI, the conjunctiva are not injected and sclera is non-icteric.  Ears: On external exam, there is no obvious lesions or asymmetry. The EACs are bilaterally without cerumen or lesions. The TMs  are in the neutral position and are mobile to pneumatic otoscopy bilaterally. There are no middle ear masses or fluid noted. Hearing is grossly intact bilaterally.  Nose: On external exam there are neither lesions nor asymmetry of the nasal tip/ dorsum. On anterior rhinoscopy, visualization posteriorly is limited on anterior examination. For this reason, to adequately evaluate posteriorly for masses, polypoid disease and/or signs of infections, nasal endoscopy is indicated (see procedure below).  Oral cavity/oropharynx: The mucosa of the lips, gums, hard and soft palate, posterior pharyngeal wall, tongue, floor of mouth, and buccal region are without masses or lesions and are normally hydrated. Good dentition. Tongue protrudes midline. Tonsils are normal appearing. Supraglottis not visualized due to gag reflex.  Neck: There is no asymmetry or masses. Trachea is midline. There is no enlargement of the thyroid or palpable thyroid nodules.   Lymphatics: There is no palpable lymphadenopathy along the jugulodiagastric, submental, or posterior cervical chains.  Chest: No audible wheeze, unlabored respirations.  Cardiovascular: Regular rate.  GI: Nondistended. Neurologic: Cranial nerve???s II-XII are grossly intact. Exam is non-focal.  Extremities: No cyanosis, clubbing or edema.  ??  Procedures:  Diagnostic Bilateral Nasal Endoscopy (CPT [phone])  ??  NOTE: Nasal endoscopy is performed for the sinuses only, and not for examination of the skull base, septum or inferior turbinates, nor is it related to any previously performed septoplasty or inferior turbinate surgery or skull base surgery.   ??  Surgeon: Egbert Garibaldi, MD  Anesthesia: none  Procedure Detail:  As a result of inability to visualize the intranasal anatomy, and after discussion of the potential risks related to the procedure (primarily bleeding), a endoscope is used to examine the left and right sinonasal cavities, including the interior of the nasal cavity and the middle and superior meatus, the turbinates, and the spheno-ethmoid recess. All these areas were inspected.  ??  Findings:    Examination on the left reveals mucopurulence coming from the left sided maxillary sinus.  Uncinate is severely inflamed and reflected out.   Spheno ethmoid recess is normal.  ??  Examination on the right reveals an intact nasal septum with no associated masses, lesions, or friable mucosa. The right middle meatus, sphenoethmoidal recess, and skull base are clear with no evidence of purulence, polyposis, or polypoid edema.  ??  ??  Oretha Ellis Nasal Endoscopy Score: [employer] is used to assess the degree of inflammation of the sinonasal structures, including the middle and superior turbinates, the ethmoid sinuses, maxillary sinuses, frontal sinuses, and sphenoid sinuses.  In the presence of previous surgery, some or all of these structures may be absent.  ??  Left                                                                    ?? Polyps:                       Absent (0)         ?? Edema:                       Severe (2)         ?? Discharge:      Thick, Purulent (  2) ?? Scarring:                    Absent (0)         ?? Crusting:                    None (0)                        ??  Total Left:                   4            ??  Right                                                                ?? Polyps:                       Absent (0)         ?? Edema:                       Absent (0)         ?? Discharge:      None (0)                        ?? Scarring:                    Absent (0)         ?? Crusting:                    None (0)                        ??  Total Right:                0  ??  ??  Labs and Diagnostic Tests  CT scan was reviewed demonstrates left-sided maxillary opacification edentulous on the left. Osteitic change within the maxillary sinus concerning for a tumor either benign or malignant.    ??  ??  Assessment:  The patient is a 63 y.o. male who  has a past medical history of Asthma, Chronic headaches, Colon polyp, Enlarged prostate, GERD (gastroesophageal reflux disease) (02/08/2013), Hepatitis C, substance abuse (CMS-HCC), Infectious viral hepatitis, Lung nodule seen on imaging study (07/12/2016), Osteoporosis, Other emphysema (CMS-HCC), Renal calculus, Skin cancer, and Tobacco abuse (02/08/2013). who presents for the evaluation of: Left-sided maxillary tumor.    ??  Recommendations:  1.  Patient with maxillary mass and asymmetry on the left side with complete opacification.  This lesion has persisted despite prolonged course of antibiotics including IV antibiotics as well as a 14 day course of doxycycline.  Discussed management this juncture including an additional course of antibiotics versus surgery.  As well as concern this may be something more than sinusitis like an inverted papilloma.   Discussed a left-sided maxillary antrostomy vs medial maxillectomy as well as left sided sinus surgery. . The procedure itself was discussed at length. The risks, benefits, and alternatives were explained in detail which include but is not limited to: anesthesia, pain, loss  of taste/smell, bleeding, infection, need for nasal packing, double vision, vision loss, CSF leak requiring other procedures to repair, numbness of teeth/and or face, need for revision surgery, brain/skull base injury, and unexpected risks. The patient understands these risks and is willing to proceed with surgery. All questions were answered. Informed written consent was obtained and signed.    The patient presents for surgery today.

## 2019-07-23 NOTE — Unmapped (Signed)
Preoperative Diagnosis:    - left maxillary lesion   - hx of cocaine use   - hx of tobacco   - left facial pain.     Postoperative Diagnosis:   - Same     Procedure(s) Performed:  1.Left medial maxillectomy CPT 31225  2. Image Guidance CPT [phone]     Teaching Surgeon:   Junius Finner    Resident: Jeralene Huff    Specimens:      Left maxillary tumor  Left sinus contents    Estimated Blood Loss:  50 ml.    Complications: none    Intra-Operative Findings:   1 ) purulence and exophytic soft tissue in left maxillary sinus and insipated secretions   2 ) normal frontal, maxillary sinus.   3) Left medial maxillectomy defect, with head of IT intact. Partial ethmoidectomy and frontal sinusotomy.    4) Propel in left frontal, nasopore in middle meatus.     Procedure:  After informed consent was obtained the patient was brought from the preoperative holding area to the operating room and placed supine on the operating table.  After the smooth induction of general endotracheal anesthesia a timeout was called and all parties were in agreement.  The patient was rotated 180 and prepped and draped in the standard surgical fashion.     Nasal endoscopy was performed and demonstrated purulence and substantial inflammation of the left OMC, anterior ethmoid and maxillary out flow tract. The procedure first began with an incision 1 cm behind the head of the IT.  The IT.  The tubinate was elefated superiorly and removed from the medial maxillary wall.  A 45 degree true cut was used to enter the maxillary sinus through the medial maxillar.  The down biter, backbiter and cutting instruments were used to remove the medial maxillary wall.  The defect was increased anteriorly to the lacrimal superiorly to the orbital floor and posteriorly to the PPF.  There was dense debris and hypertrophic exophytic tissue within the maxillary sinus.  This was removed for pathology.  The frontal sinus was opened as was the anterior ethmoid.  A propel stent was placed and a nasopore was inserted to medialize the MT.    The patient's care was then turned over to the anesthesia team who successfully awakened and extubated the patient without complication.  All instrument, sharp and lap counts were correct at the end of the case.    The patient was transported to the PACU in stable condition where he will be discharged from ambulatory surgery with pain medications and a plan for follow-up in one week.      I was present for the entirety of the procedure(s). Reniya Mcclees Grant Ruts, MD

## 2019-07-23 NOTE — Unmapped (Signed)
You received 1000mg  (1gm) of tylenol at 1pm. You can take another equivalent dose (2 extra strength or 3 regular strength tylenol) at 9pm.       Do not take more than 3 gm of tylenol in a day.

## 2019-07-25 NOTE — Unmapped (Signed)
Received call from patient's wife. He is POD2 from limited FESS with Dr. Ralene Ok    She stated patient went to the bathroom a few minutes prior to her paging [place], and had an episode of intense chest pain and 'shakes'. No nose bleeding. She states she had to help him back to bed. She states that the chest pain is in a band-like distribution across his chest going all the way to his back. He does not think he pulled a chest muscle.     He denies taking any substances (history of cocaine use) earlier today.     Upon chart review, it appears that he has had prior episodes of atypical chest pain and occasional SVT episodes.    Recommended to patient's wife that they should go to the ER so they can have this episode of chest pain worked up further.

## 2019-07-26 NOTE — Unmapped (Signed)
[facility]    BUFFERED ISOTONIC SALINE NASAL IRRIGATION    The Benefits:    1. When you irrigate, the isotonic saline (salt water) acts as a solvent and washes the mucus crusts and other debris from your nose.    2. This decongests and improves the airflow into your nose.  The sinus passages begin to open.    3. Studies have also shown that a salt water and an alkaline (baking soda) irrigation solution improves nasal membrane cell function (mucociliary flow of mucus debris).    The Instructions:  The easiest way to perform these irrigations is with a NeilMed Sinus Rinse Bottle.  This is available at nearly all major drug stores and grocery stores.  You will need to purchase the bottle as well as the premixed packets.    You should plan to irrigate your nose with buffered isotonic saline 2 times per day.  Many people prefer to warm the solution slightly in the microwave - but be sure that the solution is NOT HOT.  Stand over the sink (some do this in the shower) and squirt the solution into each side of your nose, keeping your mouth open. This allows you to spit the saltwater out of your mouth.  It will not harm you if you swallow a little.    If you have been told to use a nasal steroid such as Flonase, Nasonex, or Nasacort, you should always use isortonic saline solution first, then use your nasal steroid product.  The nasal steroid is much more effective when sprayed onto clean nasal membranes and the steroid medicine will reach deeper into the nose.    Most people experience a little burning sensation the first few times they use a isotonic saline solution, but this usually goes away within a few days.      Contact Information:  You saw Egbert Garibaldi, MD/PhD for your visit today    For appointments call [phone] OPT 1    To speak to a nurse call Catalina Antigua  Phone: [phone]    To schedule surgery contact Maryann Alar:  Phone: [phone] email: [email or web address]     After hours, please call [phone] and ask for the ENT physician on call.    Main phone tree:  [phone]  Triage M-F: 8:15-4:00pm [phone]  Office fax #:  [phone]

## 2019-07-30 ENCOUNTER — Ambulatory Visit
Admit: 2019-07-30 | Discharge: 2019-07-31 | Payer: PRIVATE HEALTH INSURANCE | Attending: Student in an Organized Health Care Education/Training Program | Primary: Student in an Organized Health Care Education/Training Program

## 2019-07-30 DIAGNOSIS — J310 Chronic rhinitis: Principal | ICD-10-CM

## 2019-07-30 NOTE — Unmapped (Signed)
Otolaryngology Clinic Note    Harold Richards is a 63 y.o. male is seen in consultation at the request of [name]  for evaluation of left-sided maxillary mass.     Left maxillary mega antrostomy, left partial ethmoid, and left frontal.     History of Present Illness:     The patient is a 63 y.o. male who  has a past medical history of Asthma, Chronic headaches, Colon polyp, Enlarged prostate, GERD (gastroesophageal reflux disease) (02/08/2013), Hepatitis C, substance abuse (CMS-HCC), Infectious viral hepatitis, Lung nodule seen on imaging study (07/12/2016), Osteoporosis, Other emphysema (CMS-HCC), Renal calculus, Skin cancer, and Tobacco abuse (02/08/2013). who presents for the evaluation of chronic sinusitis.  Patient reports left sided facial pain and pressure for >1 year.  Was seen by Dr. Sherryll Burger on 7/28 and was incidental noted to have purulence from an inflammed left maxillary/ethomid region.  He was treated with 14 days of antibotics (doxy) and scheduled to have a CT scan.  Prior to the CT he was admitted to the hospital on 8/30 with a community aquired pneumonia, fevers and SOB.   He was treated with IV cephalosporins and transitioned to PO levaquin.  Following this he had his originally scheduled CT and f/u with Dr. Sherryll Burger on 9/21, despite 2 course of antibiotics he still have persistnet purulence and CT scan demonstrated left maxillary opacification concerning for a mass, extending into the anterior ethmoid.  CT scan is concerning for fungal ball or inverted papillooma given the asymmetry.  Reports his left nose from a breathing stand point is bad.  Complain of persistent headaches and some upper teeth pain (edentulous but pain on the alveolous) that all seem to be left-sided.     Still having some trouble with swallowing and choking and has seen Dr. Martina Sinner.   Has not used any steroid or rinses.  Sense of smell/taste has declined.  Denies any recent antibiotics within the last month. Patient works for [employer]    Update 07/30/2019:  [name] is a 63 y.o. male who last seen in 07/24/2019.  He reports he  has been doing  Good. He does have some chest pain the first night of surgery.  He stopped the Suboxone initially ended up taking 3 oxycodone his back on the Suboxone clinic pain is well managed.  Overall he reports he is doing well.  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 No malignancy.       Patients medical records were personally reviewed.       A 12 point review of systems was negative except as indicated.  The patient denies fevers, chills, shortness of breath, chest pain, nausea, vomiting, diarrhea, inability to lie flat, dysphagia, odynophagia, hemoptysis, hematemesis, changes in vision, changes in voice quality, otalgia, otorrhea, vertiginous symptoms, focal deficits, or other concerning symptoms.    Past Medical History     has a past medical history of Asthma, Chronic headaches, Colon polyp, Enlarged prostate, GERD (gastroesophageal reflux disease) (02/08/2013), Hepatitis C, substance abuse (CMS-HCC), Infectious viral hepatitis, Lung nodule seen on imaging study (07/12/2016), Osteoporosis, Other emphysema (CMS-HCC), Renal calculus, Skin cancer, and Tobacco abuse (02/08/2013).    Past Surgical History     has a past surgical history that includes Cervical fusion; pr excision submaxillary gland (Right, 03/31/2015); pr colsc flx w/rmvl of tumor polyp lesion snare tq (N/A, 10/20/2015); pr upper gi endoscopy,diagnosis (N/A, 08/18/2017); pr stereotactic comp assist proc,cranial,extradural (Left, 07/22/2019); and pr remv upper jaw-maxillectomy (Left,  07/22/2019).    Current Medications    Current Outpatient Medications   Medication Sig Dispense Refill   ??? albuterol HFA 90 mcg/actuation inhaler Inhale 2 puffs every four (4) hours as needed for wheezing. 3 Inhaler 3   ??? aspirin (ECOTRIN) 81 MG tablet Take 81 mg by mouth daily. ??? atorvastatin (LIPITOR) 20 MG tablet Take 1 tablet (20 mg total) by mouth nightly. 90 tablet 3   ??? cholecalciferol, vitamin D3, (VITAMIN D3) 1,000 unit capsule Take 1,000 Units by mouth daily.     ??? cyanocobalamin (VITAMIN B-12) 250 MCG tablet Take 250 mcg by mouth daily.     ??? doxycycline (VIBRA-TABS) 100 MG tablet Take 1 tablet (100 mg total) by mouth Two (2) times a day for 14 days. 28 tablet 0   ??? ferrous sulfate 325 mg (65 mg iron) CpER Take 1 tablet by mouth daily.      ??? fluticasone propion-salmeterol (ADVAIR DISKUS) 250-50 mcg/dose diskus Inhale 1 puff Two (2) times a day. 3 each 3   ??? guaiFENesin (MUCINEX) 600 mg 12 hr tablet Take 600 mg by mouth every morning.     ??? lactulose (CHRONULAC) 10 gram/15 mL solution Take 10 g by mouth Two (2) times a day.      ??? loratadine (CLARITIN) 10 mg tablet Take 10 mg by mouth daily.     ??? multivitamin (TAB-A-VITE/THERAGRAN) per tablet Take 1 tablet by mouth daily.     ??? nicotine polacrilex (NICORETTE) 4 MG gum Apply 1 each (4 mg total) to cheek every hour as needed for smoking cessation (max 24 pieces per day). 110 each 2   ??? ondansetron (ZOFRAN-ODT) 4 MG disintegrating tablet Dissolve 1 tablet (4 mg total) on the tongue every eight (8) hours as needed for nausea. 10 tablet 0   ??? rifAXIMin (XIFAXAN) 550 mg Tab Take 1 tablet (550 mg total) by mouth Two (2) times a day. 60 tablet 6   ??? SUBOXONE 8-2 mg sublingual film Place 1 Film under the tongue Two (2) times a day.   0   ??? tamsulosin (FLOMAX) 0.4 mg capsule Take 1 capsule (0.4 mg total) by mouth two (2) times a day. 90 capsule 3   ??? vit C,E-Zn-coppr-lutein-zeaxan (PRESERVISION AREDS 2) 250-200-40-1 mg-unit-mg-mg cap Take 1 tablet by mouth Two (2) times a day.     ??? vitamin A 16109 UNIT capsule Take 10,000 Units by mouth daily.       No current facility-administered medications for this visit.        Allergies    Allergies   Allergen Reactions   ??? Bicalutamide Rash   ??? Penicillins Rash   ??? Rocephin [Ceftriaxone] Rash Patient received in Emergency Room and reported rash to stomach and back several hours later.        Family History    Negative for bleeding disorders or free bleeding.     family history includes Bone cancer in his mother; Heart attack in his father; Hepatitis in his sister; Hypertension in his father and mother; Lung cancer in his mother.    Social History:     reports that he has been smoking cigarettes. He started smoking about 50 years ago. He has a 12.50 pack-year smoking history. He quit smokeless tobacco use about 11 years ago.  His smokeless tobacco use included chew.   reports no history of alcohol use.   reports previous drug use. Drugs: Cocaine and IV.    Review of Systems  A 12  system review of systems was performed and is negative other than that noted in the history of present illness.    Vital Signs  There were no vitals taken for this visit.    Physical Exam  General: Well-developed, well-nourished. Appropriate, comfortable, and in no apparent distress.  Head/Face: On external examination there is no obvious asymmetry or scars. On palpation there is no tenderness over maxillary sinuses or masses within the salivary glands. Cranial nerves V and VII are intact through all distributions.  Eyes: PERRL, EOMI, the conjunctiva are not injected and sclera is non-icteric.  Ears: On external exam, there is no obvious lesions or asymmetry. The EACs are bilaterally without cerumen or lesions. The TMs are in the neutral position and are mobile to pneumatic otoscopy bilaterally. There are no middle ear masses or fluid noted. Hearing is grossly intact bilaterally.  Nose: On external exam there are neither lesions nor asymmetry of the nasal tip/ dorsum. On anterior rhinoscopy, visualization posteriorly is limited on anterior examination. For this reason, to adequately evaluate posteriorly for masses, polypoid disease and/or signs of infections, nasal endoscopy is indicated (see procedure below). Oral cavity/oropharynx: The mucosa of the lips, gums, hard and soft palate, posterior pharyngeal wall, tongue, floor of mouth, and buccal region are without masses or lesions and are normally hydrated. Good dentition. Tongue protrudes midline. Tonsils are normal appearing. Supraglottis not visualized due to gag reflex.  Neck: There is no asymmetry or masses. Trachea is midline. There is no enlargement of the thyroid or palpable thyroid nodules.   Lymphatics: There is no palpable lymphadenopathy along the jugulodiagastric, submental, or posterior cervical chains.  Chest: No audible wheeze, unlabored respirations.  Cardiovascular: Regular rate.  GI: Nondistended.  Neurologic: Cranial nerve???s II-XII are grossly intact. Exam is non-focal.  Extremities: No cyanosis, clubbing or edema.    Sinonasal Endoscopy with debridement (CPT 31237-LT): Due to the patient???s chronic sinusitis/chronic rhinitis, sinonasal endoscopy with debridement is indicated.  After discussion of risks and benefits, and topical decongestion and anesthesia, an endoscope was used to perform nasal endoscopy with debridement. Suctions and Tobey forceps were used to remove crust and fibrin debris from the affected sinuses without complications.   A time out identifying the patient, the procedure, the location of the procedure and any concerns was performed prior to beginning the procedure.    NOTE: Debridement is performed for the sinuses only, and not for treatment of the septum or inferior turbinates, nor is it related to any previously performed septoplasty or inferior turbinate surgery.     Left-sided nasal pore was removed from the ostiomeatal complex.  Medial maxillectomy defect was suctioned and debrided of well formed blood clot.  Frontal sinus was visualized and widely patent.    Labs and Diagnostic Tests CT scan was reviewed demonstrates left-sided maxillary opacification edentulous on the left. Osteitic change within the maxillary sinus concerning for a tumor either benign or malignant.        Assessment:  The patient is a 63 y.o. male who  has a past medical history of Asthma, Chronic headaches, Colon polyp, Enlarged prostate, GERD (gastroesophageal reflux disease) (02/08/2013), Hepatitis C, substance abuse (CMS-HCC), Infectious viral hepatitis, Lung nodule seen on imaging study (07/12/2016), Osteoporosis, Other emphysema (CMS-HCC), Renal calculus, Skin cancer, and Tobacco abuse (02/08/2013). who presents for the evaluation of: Left-sided maxillary tumor.      Recommendations:  1.  Patient status post removal of left-sided maxillary tumor.  Pathology demonstrated a benign fungal ball.  No concerns of  malignancy.  Patient is healing well first postoperative visit.  We will plan on following up in approximately 3 weeks for repeat examination    The patient voiced complete understanding of plan as detailed above and is in full agreement.

## 2019-08-07 NOTE — Unmapped (Signed)
[facility] Clinical Assessment & Refill Coordination Note    Harold Richards, [place]: 10-Aug-1955  Phone: [phone] (home)     All above HIPAA information was verified with patient.     Was a Nurse, learning disability used for this call? No    Specialty Medication(s):   Infectious Disease: Xifaxan     Current Outpatient Medications   Medication Sig Dispense Refill   ??? albuterol HFA 90 mcg/actuation inhaler Inhale 2 puffs every four (4) hours as needed for wheezing. 3 Inhaler 3   ??? aspirin (ECOTRIN) 81 MG tablet Take 81 mg by mouth daily.     ??? atorvastatin (LIPITOR) 20 MG tablet Take 1 tablet (20 mg total) by mouth nightly. 90 tablet 3   ??? cholecalciferol, vitamin D3, (VITAMIN D3) 1,000 unit capsule Take 1,000 Units by mouth daily.     ??? cyanocobalamin (VITAMIN B-12) 250 MCG tablet Take 250 mcg by mouth daily.     ??? ferrous sulfate 325 mg (65 mg iron) CpER Take 1 tablet by mouth daily.      ??? fluticasone propion-salmeterol (ADVAIR DISKUS) 250-50 mcg/dose diskus Inhale 1 puff Two (2) times a day. 3 each 3   ??? guaiFENesin (MUCINEX) 600 mg 12 hr tablet Take 600 mg by mouth every morning.     ??? lactulose (CHRONULAC) 10 gram/15 mL solution Take 10 g by mouth Two (2) times a day.      ??? loratadine (CLARITIN) 10 mg tablet Take 10 mg by mouth daily.     ??? multivitamin (TAB-A-VITE/THERAGRAN) per tablet Take 1 tablet by mouth daily.     ??? nicotine polacrilex (NICORETTE) 4 MG gum Apply 1 each (4 mg total) to cheek every hour as needed for smoking cessation (max 24 pieces per day). 110 each 2   ??? ondansetron (ZOFRAN-ODT) 4 MG disintegrating tablet Dissolve 1 tablet (4 mg total) on the tongue every eight (8) hours as needed for nausea. 10 tablet 0   ??? rifAXIMin (XIFAXAN) 550 mg Tab Take 1 tablet (550 mg total) by mouth Two (2) times a day. 60 tablet 6   ??? SUBOXONE 8-2 mg sublingual film Place 1 Film under the tongue Two (2) times a day.   0 ??? tamsulosin (FLOMAX) 0.4 mg capsule Take 1 capsule (0.4 mg total) by mouth two (2) times a day. 90 capsule 3   ??? vit C,E-Zn-coppr-lutein-zeaxan (PRESERVISION AREDS 2) 250-200-40-1 mg-unit-mg-mg cap Take 1 tablet by mouth Two (2) times a day.     ??? vitamin A 56213 UNIT capsule Take 10,000 Units by mouth daily.       No current facility-administered medications for this visit.         Changes to medications: [name] reports no changes at this time.    Allergies   Allergen Reactions   ??? Bicalutamide Rash   ??? Penicillins Rash   ??? Rocephin [Ceftriaxone] Rash     Patient received in Emergency Room and reported rash to stomach and back several hours later.        Changes to allergies: No    SPECIALTY MEDICATION ADHERENCE     Xifaxan 550 mg: 13-14 days of medicine on hand     Medication Adherence    Patient reported X missed doses in the last month: 0  Specialty Medication: Xifaxan 550mg   Patient is on additional specialty medications: No  Any gaps in refill history greater than 2 weeks in the last 3 months: no  Demonstrates understanding of  importance of adherence: yes  Informant: patient  Provider-estimated medication adherence level: good  Patient is at risk for Non-Adherence: No          Specialty medication(s) dose(s) confirmed: Regimen is correct and unchanged.     Are there any concerns with adherence? No    Adherence counseling provided? Not needed    CLINICAL MANAGEMENT AND INTERVENTION      Clinical Benefit Assessment:    Do you feel the medicine is effective or helping your condition? Yes    Clinical Benefit counseling provided? Not needed    Adverse Effects Assessment:    Are you experiencing any side effects? No    Are you experiencing difficulty administering your medicine? No    Quality of Life Assessment:    How many days over the past month did your cirrhosis  keep you from your normal activities? For example, brushing your teeth or getting up in the morning. 0 Have you discussed this with your provider? Not needed    Therapy Appropriateness:    Is therapy appropriate? Yes, therapy is appropriate and should be continued    DISEASE/MEDICATION-SPECIFIC INFORMATION      N/A    PATIENT SPECIFIC NEEDS     ? Does the patient have any physical, cognitive, or cultural barriers? No    ? Is the patient high risk? No     ? Does the patient require a Care Management Plan? No     ? Does the patient require physician intervention or other additional services (i.e. nutrition, smoking cessation, social work)? No      SHIPPING     Specialty Medication(s) to be Shipped:   Infectious Disease: Xifaxan    Other medication(s) to be shipped: n/a     Changes to insurance: No    Delivery Scheduled: Yes, Expected medication delivery date: 08/15/2019.     Medication will be delivered via Next Day Courier to the confirmed prescription address in [facility].    The patient will receive a drug information handout for each medication shipped and additional FDA Medication Guides as required.  Verified that patient has previously received a Conservation officer, historic buildings.    All of the patient's questions and concerns have been addressed.    Roderic Palau   [facility] Specialty Pharmacist

## 2019-08-13 DIAGNOSIS — G9340 Encephalopathy, unspecified: Principal | ICD-10-CM

## 2019-08-13 NOTE — Unmapped (Signed)
Per test claim for Xifaxan at the [facility], patient needs Medication Assistance Program for High Copay.

## 2019-08-16 MED FILL — XIFAXAN 550 MG TABLET: 30 days supply | Qty: 60 | Fill #6 | Status: AC

## 2019-08-16 MED FILL — XIFAXAN 550 MG TABLET: ORAL | 30 days supply | Qty: 60 | Fill #6

## 2019-08-22 ENCOUNTER — Encounter
Admit: 2019-08-22 | Discharge: 2019-08-23 | Payer: PRIVATE HEALTH INSURANCE | Attending: Student in an Organized Health Care Education/Training Program | Primary: Student in an Organized Health Care Education/Training Program

## 2019-08-23 NOTE — Unmapped (Signed)
Otolaryngology Clinic Note    Harold Richards is a 64 y.o. male is seen in consultation at the request of [name]  for evaluation of left-sided maxillary mass.     Left maxillary mega antrostomy, left partial ethmoid, and left frontal.     History of Present Illness:     The patient is a 64 y.o. male who  has a past medical history of Asthma, Chronic headaches, Colon polyp, Enlarged prostate, GERD (gastroesophageal reflux disease) (02/08/2013), Hepatitis C, substance abuse (CMS-HCC), Infectious viral hepatitis, Lung nodule seen on imaging study (07/12/2016), Osteoporosis, Other emphysema (CMS-HCC), Renal calculus, Skin cancer, and Tobacco abuse (02/08/2013). who presents for the evaluation of chronic sinusitis.  Patient reports left sided facial pain and pressure for >1 year.  Was seen by Dr. Sherryll Burger on 7/28 and was incidental noted to have purulence from an inflammed left maxillary/ethomid region.  He was treated with 14 days of antibotics (doxy) and scheduled to have a CT scan.  Prior to the CT he was admitted to the hospital on 8/30 with a community aquired pneumonia, fevers and SOB.   He was treated with IV cephalosporins and transitioned to PO levaquin.  Following this he had his originally scheduled CT and f/u with Dr. Sherryll Burger on 9/21, despite 2 course of antibiotics he still have persistnet purulence and CT scan demonstrated left maxillary opacification concerning for a mass, extending into the anterior ethmoid.  CT scan is concerning for fungal ball or inverted papillooma given the asymmetry.  Reports his left nose from a breathing stand point is bad.  Complain of persistent headaches and some upper teeth pain (edentulous but pain on the alveolous) that all seem to be left-sided.     Still having some trouble with swallowing and choking and has seen Dr. Martina Sinner.   Has not used any steroid or rinses.  Sense of smell/taste has declined.  Denies any recent antibiotics within the last month. Patient works for [employer]    Update 07/30/2019:  Harold Richards is a 64 y.o. male who last seen in 07/24/2019.  He reports he  has been doing  Good. He does have some chest pain the first night of surgery.  He stopped the Suboxone initially ended up taking 3 oxycodone his back on the Suboxone clinic pain is well managed.  Overall he reports he is doing well.  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 No malignancy.     Update 1//14/2020:  Harold Richards is a 64 y.o. male who last seen in 07/30/2019.  He reports he  has been doing  Much better.  His nose feels much better than prior to surgery. Less pain, less drainage, no problems.  He has completed his antibiotics.  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  His chronic pain is currently well controlled.      Patients medical records were personally reviewed.       A 12 point review of systems was negative except as indicated.  The patient denies fevers, chills, shortness of breath, chest pain, nausea, vomiting, diarrhea, inability to lie flat, dysphagia, odynophagia, hemoptysis, hematemesis, changes in vision, changes in voice quality, otalgia, otorrhea, vertiginous symptoms, focal deficits, or other concerning symptoms.    Past Medical History     has a past medical history of Asthma, Chronic headaches, Colon polyp, Enlarged prostate, GERD (gastroesophageal reflux disease) (02/08/2013), Hepatitis C, substance abuse (CMS-HCC), Infectious  viral hepatitis, Lung nodule seen on imaging study (07/12/2016), Osteoporosis, Other emphysema (CMS-HCC), Renal calculus, Skin cancer, and Tobacco abuse (02/08/2013).    Past Surgical History     has a past surgical history that includes Cervical fusion; pr excision submaxillary gland (Right, 03/31/2015); pr colsc flx w/rmvl of tumor polyp lesion snare tq (N/A, 10/20/2015); pr upper gi endoscopy,diagnosis (N/A, 08/18/2017); pr stereotactic comp assist proc,cranial,extradural (Left, 07/22/2019); and pr remv upper jaw-maxillectomy (Left, 07/22/2019).    Current Medications    Current Outpatient Medications   Medication Sig Dispense Refill   ??? albuterol HFA 90 mcg/actuation inhaler Inhale 2 puffs every four (4) hours as needed for wheezing. 3 Inhaler 3   ??? aspirin (ECOTRIN) 81 MG tablet Take 81 mg by mouth daily.     ??? atorvastatin (LIPITOR) 20 MG tablet Take 1 tablet (20 mg total) by mouth nightly. 90 tablet 3   ??? cholecalciferol, vitamin D3, (VITAMIN D3) 1,000 unit capsule Take 1,000 Units by mouth daily.     ??? cyanocobalamin (VITAMIN B-12) 250 MCG tablet Take 250 mcg by mouth daily.     ??? ferrous sulfate 325 mg (65 mg iron) CpER Take 1 tablet by mouth daily.      ??? fluticasone propion-salmeterol (ADVAIR DISKUS) 250-50 mcg/dose diskus Inhale 1 puff Two (2) times a day. 3 each 3   ??? guaiFENesin (MUCINEX) 600 mg 12 hr tablet Take 600 mg by mouth every morning.     ??? lactulose (CHRONULAC) 10 gram/15 mL solution Take 10 g by mouth Two (2) times a day.      ??? loratadine (CLARITIN) 10 mg tablet Take 10 mg by mouth daily.     ??? multivitamin (TAB-A-VITE/THERAGRAN) per tablet Take 1 tablet by mouth daily.     ??? nicotine polacrilex (NICORETTE) 4 MG gum Apply 1 each (4 mg total) to cheek every hour as needed for smoking cessation (max 24 pieces per day). 110 each 2   ??? rifAXIMin (XIFAXAN) 550 mg Tab Take 1 tablet (550 mg total) by mouth Two (2) times a day. 60 tablet 6   ??? SUBOXONE 8-2 mg sublingual film Place 1 Film under the tongue Two (2) times a day.   0   ??? tamsulosin (FLOMAX) 0.4 mg capsule Take 1 capsule (0.4 mg total) by mouth two (2) times a day. 90 capsule 3   ??? vit C,E-Zn-coppr-lutein-zeaxan (PRESERVISION AREDS 2) 250-200-40-1 mg-unit-mg-mg cap Take 1 tablet by mouth Two (2) times a day.     ??? vitamin A 16109 UNIT capsule Take 10,000 Units by mouth daily.       No current facility-administered medications for this visit.        Allergies    Allergies   Allergen Reactions   ??? Bicalutamide Rash ??? Penicillins Rash   ??? Rocephin [Ceftriaxone] Rash     Patient received in Emergency Room and reported rash to stomach and back several hours later.        Family History    Negative for bleeding disorders or free bleeding.     family history includes Bone cancer in his mother; Heart attack in his father; Hepatitis in his sister; Hypertension in his father and mother; Lung cancer in his mother.    Social History:     reports that he has been smoking cigarettes. He started smoking about 50 years ago. He has a 12.50 pack-year smoking history. He quit smokeless tobacco use about 12 years ago.  His smokeless tobacco use included chew.  reports no history of alcohol use.   reports previous drug use. Drugs: Cocaine and IV.    Review of Systems  A 12 system review of systems was performed and is negative other than that noted in the history of present illness.    Rhinosinusitis Disability Index  ?? Because of my problem I feel stressed in relationships with friends and family.: 0  ?? Because of my problem I feel confused.: 0  ?? Because of my problem I have difficulty paying attention.: 0  ?? Because of my difficulty I avoid being around people.: 0  ?? Because of my problem I am frequently angry.: 0  ?? Because of my problem I do not like to socialize.: 0  ?? Because of my problem I frequently feel tense.: 0  ?? Because of my problem I frequently feel irritable.: 0  ?? Because of my problem I am depressed.: 0  ?? My problem places stress on my relationship with members of my family or friends.: 0  ?? Because of my problem I feel handicapped.: 0  ?? Because of my problem I feel restricted in performace of my routine daily activities.: 0  ?? Because of my problem I restrict my recreational activities.: 0  ?? Because of my problem I feel frustrated.: 0  ?? Because of my problem I feel fatigued.: 0  ?? Because of my problem I avoid traveling.: 0  ?? Because of my problem I miss work or social activities.: 0  ?? My outlook on the world is affected by my problem.: 0  ?? Because of my problem I find it difficult to focus my attention away from my problem and on other things.: 0  ?? The pain of pressure in my face makes it difficult for me to concentrate.: 0  ?? The pain in my eyes makes it difficult for me to read.: 0  ?? I have difficulty stooping over to lift objects due to face pressure.: 0  ?? Because of my problem I have difficulty with strenuous yard work and housework.: 0  ?? Straining increases or worsend my problem.: 0  ?? I am inconvenienced by my chronic runny nose.: 2  ?? Food does not taste good because of my change in smell.: 3  ?? My frequent sniffing is irritating to my friends and family.: 1  ?? Because of my problem I do not sleep well.: 3  ?? I have difficulty with exertion due to my nasal obstruction.: 1  ?? My sexual activity is affected by my problem.: 0  Total Score: 10      Vital Signs  Temperature 36.6 ??C (97.9 ??F), height 167.6 cm (5' 6), weight 50.8 kg (112 lb), SpO2 99 %.    Physical Exam  General: Well-developed, well-nourished. Appropriate, comfortable, and in no apparent distress.  Head/Face: On external examination there is no obvious asymmetry or scars. On palpation there is no tenderness over maxillary sinuses or masses within the salivary glands. Cranial nerves V and VII are intact through all distributions.  Eyes: PERRL, EOMI, the conjunctiva are not injected and sclera is non-icteric.  Ears: On external exam, there is no obvious lesions or asymmetry. The EACs are bilaterally without cerumen or lesions. The TMs are in the neutral position and are mobile to pneumatic otoscopy bilaterally. There are no middle ear masses or fluid noted. Hearing is grossly intact bilaterally.  Nose: On external exam there are neither lesions nor asymmetry of the nasal tip/ dorsum. On anterior rhinoscopy,  visualization posteriorly is limited on anterior examination. For this reason, to adequately evaluate posteriorly for masses, polypoid disease and/or signs of infections, nasal endoscopy is indicated (see procedure below).  Oral cavity/oropharynx: The mucosa of the lips, gums, hard and soft palate, posterior pharyngeal wall, tongue, floor of mouth, and buccal region are without masses or lesions and are normally hydrated. Good dentition. Tongue protrudes midline. Tonsils are normal appearing. Supraglottis not visualized due to gag reflex.  Neck: There is no asymmetry or masses. Trachea is midline. There is no enlargement of the thyroid or palpable thyroid nodules.   Lymphatics: There is no palpable lymphadenopathy along the jugulodiagastric, submental, or posterior cervical chains.  Chest: No audible wheeze, unlabored respirations.  Cardiovascular: Regular rate.  GI: Nondistended.  Neurologic: Cranial nerve???s II-XII are grossly intact. Exam is non-focal.  Extremities: No cyanosis, clubbing or edema.    Sinonasal Endoscopy with debridement (CPT 31237-LT): Due to the patient???s chronic sinusitis/chronic rhinitis, sinonasal endoscopy with debridement is indicated.  After discussion of risks and benefits, and topical decongestion and anesthesia, an endoscope was used to perform nasal endoscopy with debridement. Suctions and Tobey forceps were used to remove crust and fibrin debris from the affected sinuses without complications.   A time out identifying the patient, the procedure, the location of the procedure and any concerns was performed prior to beginning the procedure.    NOTE: Debridement is performed for the sinuses only, and not for treatment of the septum or inferior turbinates, nor is it related to any previously performed septoplasty or inferior turbinate surgery.     Medial maxillectomy suctioned and crust removed. Muocsa is healing well.  Frontal sinus widely patent.  Right side normal. Mild edema.  Septal deviation.     Labs and Diagnostic Tests  CT scan was reviewed demonstrates left-sided maxillary opacification edentulous on the left. Osteitic change within the maxillary sinus concerning for a tumor either benign or malignant.        Assessment:  The patient is a 64 y.o. male who  has a past medical history of Asthma, Chronic headaches, Colon polyp, Enlarged prostate, GERD (gastroesophageal reflux disease) (02/08/2013), Hepatitis C, substance abuse (CMS-HCC), Infectious viral hepatitis, Lung nodule seen on imaging study (07/12/2016), Osteoporosis, Other emphysema (CMS-HCC), Renal calculus, Skin cancer, and Tobacco abuse (02/08/2013). who presents for the evaluation of: Left-sided maxillary tumor.      Recommendations:  1.  Patient status post removal of left-sided maxillary tumor.  Pathology demonstrated a benign fungal ball.  No concerns of malignancy.      Will plan on seeing back in 2-3 months for repeat exam.     The patient voiced complete understanding of plan as detailed above and is in full agreement.

## 2019-08-28 ENCOUNTER — Ambulatory Visit
Admit: 2019-08-28 | Discharge: 2019-09-08 | Payer: PRIVATE HEALTH INSURANCE | Attending: Radiation Oncology | Primary: Radiation Oncology

## 2019-08-28 ENCOUNTER — Institutional Professional Consult (permissible substitution): Admit: 2019-08-28 | Discharge: 2019-08-29 | Payer: PRIVATE HEALTH INSURANCE

## 2019-08-28 DIAGNOSIS — C61 Malignant neoplasm of prostate: Principal | ICD-10-CM

## 2019-08-28 LAB — PROSTATE SPECIFIC ANTIGEN: Prostate specific Ag:MCnc:Pt:Ser/Plas:Qn:: <0.1

## 2019-08-28 NOTE — Unmapped (Signed)
RADIATION ONCOLOGY FOLLOW-UP VISIT NOTE     Encounter Date: 08/28/2019  Patient Name: Harold Richards  Medical Record Number: [id number]    DIAGNOSIS:  64 y.o.??male??with high??risk prostate cancer, cT1c, Gleason??4+4=8??in 2/12??cores, PSA 4.59 s/p external beam RT (45Gy) with brachytherapy boost (brachy done 11/05/2018)    DURATION SINCE COMPLETION OF RADIOTHERAPY:  6 months (brachy on 11/05/2018)    Lupron #1 (3 month injection) 07/17/2018  Lupron #2 (3 month injection) 10/16/2018  Lupron #3 (3 month injection) 01/16/2019  Lupron #4 (1 month injection) 04/18/2019  Eligard #5 (3 month injection) 05/29/2019  Eligard #6 (3 month injection) 08/28/2019    ASSESSMENT:  Disease Status: No Evidence of Disease (NED)    RECOMMENDATIONS:  1. PSA today <0.1- he would like to check every other visit as PSA should remain undetectable while on lupron.  We discussed that PSA can rise as testosterone recovers after he finishes ADT  2. ADT:  Due for leuprolide today- This will be his 5th 3 month injection in addition to a 1 month injection he received due to a lupron shortage a few months ago.  In total this is 16 months of ADT.  Discussed the goal is 18 months total.  3. FOLLOW-UP:  He will return in 3 months for next lupron (final one)  4. GU:  Stable- Continue flomax BID  5. GI:  No issues- colonoscopy in 2017 with recommendation for next scope in 2022    INTERVAL HISTORY:      Doing well today.  He does have some stable fatigue compared to baseline.  Hot flashes a few times per day- most noticeable right after he gets the injections.  Overall feels like urinary issues are stable/improved from last visit.  He has mild daytime frequency (drinks lots of water) and nocturia 2-3x (similar to baseline).  Dysuria has resolved.  He is taking flomax BID.  No new GI issues (some loose stool occasionally but does take lactulose).  Since our last visit he did see ENT for fungal ball in the ethmoid sinus and had surgery last month for this- recovering well.    Baseline  General:??Very fatigued. No ED  Urinary:??On flomax, post-void residual 0 cc.??Has frequency, nocturia 2-3x/night, no urgency, hematuria, dysuria.??  GI: Denies loose stools, rectal pain, or bleeding.   Colonoscopy:??Done 10/2015, found polyp. ??Repeat in 5 years.    REVIEW OF SYSTEMS:  A comprehensive review of 10 systems was negative except for pertinent positives noted in HPI.    PAST MEDICAL HISTORY/FAMILY HISTORY/SOCIAL HISTORY:  Reviewed in EPIC    ALLERGIES/MEDICATIONS:  Reviewed in EPIC    PHYSICAL EXAM:  Vital Signs for this encounter:   BP 109/53  - Pulse 63  - Temp 36.8 ??C (98.2 ??F) (Temporal)  - Wt 51 kg (112 lb 8 oz)  - BMI 18.16 kg/m??   Karnofsky/Lansky Performance Status: 90,  Able to carry on normal activity; minor signs or symptoms of disease (ECOG equivalent 0)  General:   No acute distress, alert and oriented X 4   Head: Normocephalic, without obvious abnormality, atraumatic  Eyes: EOMI, no scleral icterus  Lungs: Normal work of breathing  Heart: regular rate and rhythm, S1, S2 normal, no murmur, click, rub or gallop  Abdomen: non-distended  Extremities: extremities normal, atraumatic, no cyanosis or edema  Lymph nodes: No palpable supraclavicular or cervical lymphadenopathy  Neurologic: Grossly normal   Rectal: Deferred given recent brachytherapy      RADIOLOGY:  No new imaging to review  Labs:    PSA   Date Value Ref Range Status   08/28/2019 <0.10 0.00 - 4.00 ng/mL Final   01/16/2019 <0.10 0.00 - 4.00 ng/mL Final   05/18/2018 4.59 (H) 0.00 - 4.00 ng/mL Final   10/26/2017 3.88 0.00 - 4.00 ng/mL Final   07/27/2017 4.15 (H) 0.00 - 4.00 ng/mL Final         Rayetta Humphrey, MD  Assistant Professor  [facility] Dept of Radiation Oncology  08/28/2019

## 2019-08-28 NOTE — Unmapped (Signed)
Eligard given and charged in clinic

## 2019-08-28 NOTE — Unmapped (Signed)
VS taken.    Labs completed   Pt received eligard injection in right lower abdomen - pt tolerated well.    AVS received at scheduling.    Pt stable and ambulated independently from clinic.  Prostates screening completed. Marland Kitchen

## 2019-09-02 DIAGNOSIS — Z122 Encounter for screening for malignant neoplasm of respiratory organs: Principal | ICD-10-CM

## 2019-09-02 DIAGNOSIS — Z720 Tobacco use: Principal | ICD-10-CM

## 2019-09-02 DIAGNOSIS — Z87898 Personal history of other specified conditions: Principal | ICD-10-CM

## 2019-09-03 NOTE — Unmapped (Signed)
Order placed for CT scan of chest, low contrast. Annual lung cancer screening examine. History of two pulmonary nodules less than 3 mm. Continued tobacco use.

## 2019-09-05 DIAGNOSIS — G9340 Encephalopathy, unspecified: Principal | ICD-10-CM

## 2019-09-06 MED ORDER — RIFAXIMIN 550 MG TABLET
ORAL_TABLET | Freq: Two times a day (BID) | ORAL | 6 refills | 30.00000 days | Status: CP
Start: 2019-09-06 — End: ?
  Filled 2019-09-12: qty 60, 30d supply, fill #0

## 2019-09-10 NOTE — Unmapped (Signed)
[facility] Refill Coordination Note    Specialty Medication(s) to be Shipped:   Infectious Disease: Xifaxan    Other medication(s) to be shipped: n/a     Harold Richards, DOB: 1956-01-28  Phone: [phone] (home)       All above HIPAA information was verified with patient.     Was a Nurse, learning disability used for this call? No    Completed refill call assessment today to schedule patient's medication shipment from the [facility] [phone]).       Specialty medication(s) and dose(s) confirmed: Regimen is correct and unchanged.   Changes to medications: Harold Richards reports no changes at this time.  Changes to insurance: No  Questions for the pharmacist: No    Confirmed patient received Welcome Packet with first shipment. The patient will receive a drug information handout for each medication shipped and additional FDA Medication Guides as required.       DISEASE/MEDICATION-SPECIFIC INFORMATION        N/A    SPECIALTY MEDICATION ADHERENCE     Medication Adherence    Patient reported X missed doses in the last month: 0  Specialty Medication: xifaxan 550mg             Patient unable to confirm quantity on hand.      SHIPPING     Shipping address confirmed in Epic.     Delivery Scheduled: Yes, Expected medication delivery date: 2/4.     Medication will be delivered via Next Day Courier to the prescription address in Epic WAM.    Harold Richards   [facility] Specialty Technician

## 2019-09-11 ENCOUNTER — Encounter: Admit: 2019-09-11 | Discharge: 2019-09-12 | Payer: PRIVATE HEALTH INSURANCE

## 2019-09-12 MED FILL — XIFAXAN 550 MG TABLET: 30 days supply | Qty: 60 | Fill #0 | Status: AC

## 2019-09-16 DIAGNOSIS — K7290 Hepatic failure, unspecified without coma: Principal | ICD-10-CM

## 2019-09-16 DIAGNOSIS — K7460 Unspecified cirrhosis of liver: Principal | ICD-10-CM

## 2019-09-16 MED ORDER — LACTULOSE 10 GRAM/15 ML ORAL SOLUTION
Freq: Two times a day (BID) | ORAL | 0 refills | 8.00000 days | Status: CP
Start: 2019-09-16 — End: ?

## 2019-09-16 NOTE — Unmapped (Signed)
Refill submitted to pharmacy for lactulose.

## 2019-09-25 ENCOUNTER — Encounter: Admit: 2019-09-25 | Discharge: 2019-09-26 | Payer: PRIVATE HEALTH INSURANCE

## 2019-09-25 DIAGNOSIS — J441 Chronic obstructive pulmonary disease with (acute) exacerbation: Principal | ICD-10-CM

## 2019-09-25 DIAGNOSIS — C61 Malignant neoplasm of prostate: Principal | ICD-10-CM

## 2019-09-25 DIAGNOSIS — K7469 Other cirrhosis of liver: Principal | ICD-10-CM

## 2019-09-25 MED ORDER — AZITHROMYCIN 250 MG TABLET
ORAL_TABLET | Freq: Every day | ORAL | 0 refills | 6 days | Status: CP
Start: 2019-09-25 — End: ?

## 2019-09-25 NOTE — Unmapped (Signed)
Omron BPs  BP#1 138/60   BP#2 125/61  BP#3 119/60    Average BP 127/60  (Patient does not need inhaler teaching, he uses an inhaler every morning)

## 2019-09-25 NOTE — Unmapped (Signed)
Medicine Changes:  ?? Start azithromycin 250mg  tablets. Take 2 tablets on the first day, then take 1 tablet daily on days 2 through 5.   ?? If you are having increased nasal drainage down the back of your throat, you can try OTC fluticasone proprionate (Flonase) nasal spray    Follow-up:   ? Return in about 5 months (around 02/22/2020). face to face    Call or Come Back to Clinic if:   ?? If your breathing does not improve or worsens. If you are needing to use your albuterol inhaler more frequently than you are now. If you develop chest pain, fever, or start feeling very ill.      IMPORTANT NUMBERS  Social Workers  I???m having trouble with my health because of cost, my mood, trouble getting to clinic, or where I live. How can I get help?  ? Call the clinic [phone] and ask to speak with one of our social workers    Financial Counselor:   ? Call Ivor Costa [phone]  ? Can give you information about [facility], Pharmacy Assistance, and insurance options    Same Day Clinic   I'm sick today and need an appointment during office hours. Who do I call?  ?? Call [phone], ask for an appointment to be seen the same day at the clinic if I do not have any availability    After Hours, Weekend, or Holidays:  It's after 5:00pm during the week or it's the weekend. How do I get medical attention?  ?? Call the [facility] [phone] or toll free [phone] to get nurse advice.  ?? Go to [facility] walk-in clinic at [address], [place], [place]; [phone]; 7 days a week from 9:00AM - 8:00PM.  ?? Go to [facility] at [facility] at [address] [place], [place]; [phone]; Mon-Fri 7:00AM-9:00PM, Sat-Sun 12:00PM-5:00PM  ?? Go to [facility] for sprains and strains, joint pain, sports injuries and possible fractures at [address], [place], [place]; [phone]; Mon-Thurs 8:00AM-7:00PM, Fri 8:00AM-5:00PM    How do I schedule an appointment or get a message to my doctor?  ? Call [phone] or   ? You may also schedule an appointment by using [facility] (patient portal) and send me a MyChart message.    How do I cancel or reschedule an appointment?  ?? Call [phone] or  ?? You may also cancel your appointment by using [facility] (patient portal).    How do I request medication refills?  ? Request a refill via [facility] (patient portal), call clinic at [phone] or have your pharmacy send an electronic request to [facility] in [place].      Best,   Dr[facility]  [address] [place] [address], [address]  [place], [address]  Phone: [phone]  Toll Free: [phone]  Fax: [phone]

## 2019-09-25 NOTE — Unmapped (Signed)
Internal Medicine Clinic Visit    Reason for Visit:  COPD exacerbation    A/P:    1. COPD exacerbation (CMS-HCC)    2. Prostate cancer (CMS-HCC)    3. Cirrhosis (CMS-HCC)      COPD exacerbation  New worsening of dyspnea on exertion, cough productive of yellow sputum, orthopnea,nightly PND c/f COPD exacerbation over 2-3 months. He previously used his albuterol inhaler once per week, now uses it nightly. At rest he is well-appearing, in no acute distress, with SpO2 96% on RA, afebrile 36.7, lung sound present and clear in all fields. He was previously well controlled on fluticasone-salmeterol (Advair) BID. Onset of symptoms coincide with sinus surgery for removal of fungus ball, and now has some post nasal drip, possibly contributory. Given relatively mild exacerbation, hx of hospitalization of CAP 03/2019, will start azithromycin alone. Minimal c/f heart failure given euvolemic exam, no JVD, EF 65% on NM stress test.  - Start azithromycin 250mg  tablets PO, 2x tablets on day 1, 1x tablets daily for remaining 4 days  - Counseled patient on return precautions and smoking cessation as below    Incidental groundglass opacities on low-dose CT lung screening 09/11/19  Patient presents with questions regarding CT finding groundglass tree-in-bud nodular opacities in the right lower lobe and peripheral groundglass opacity in the anterior right upper lobe; no nodules c/f malignancy. No fever, chills, night sweats, unexplained weight loss. Provided patient reassurance, recommended continued annual screenings.    Smoking hx and cessation  12.5 pack-year hx over the last 50 years. 3-4 cigarettes a day currently, mostly craves them after eating. He is very motivated to quit and has already significantly cut back. Will continue nicotine patches OTC.    Cirrhosis  Stable on xifaxan and lactulose. Diarrhea but used to it, tolerates well, adherent to lactulose.    High-risk prostate CA on ADT s/p brachytherapy 10/09/2018  Last PSA undetectable. Managed by rad onc with ADT on leuprolide since 07/2018, last injection scheduled in 3 months. Minor side effects including fatigue, hot flashes after injection, but otherwise tolerating well. Denies new polyuria/nocturia per baseline, constipation, diarrhea, GI upset, pain.    S/p nasal endoscopy removal of benign fungus ball on 07/22/2019  Feels breathing is much improved after removal by [facility]. Denies new pain or drainage. Will f/u with ENT next month.    Medication adherence and barriers to the treatment plan have been addressed. Opportunities to optimize healthy behaviors have been discussed. Patient / caregiver voiced understanding.    Return in about 5 months (around 02/22/2020).F2F    __________________________________________________________    HPI:    The patient is a 64 y.o. man with a hx of COPD, s/p cirrhosis, high-risk prostate CA on ADT s/p brachytherapy, presenting with 2-3 months of dyspnea on exertion and cough.    He reports 2-3 months dyspnea with exertion at work, exercise intolerance, cough productive of yellow sputum, PND nightly. His COPD was previously far less bothersome, requiring albuterol about 1-2 times weekly. He believes his exercise capacity has steadily decreased over several years, requiring him to take on less physically demanding tasks at his job, but the current symptoms are new and unexpected. He is especially worried about the PND, saying he wakes up almost every night gasping for breath, and now needs to sleep propped up on several pillows.    He denies any chest pain, fever, NVD, abdominal pain, diarrhea, skin rashes.  __________________________________________________________    Problem List:  Patient Active Problem List  Diagnosis   ??? Chronic headaches   ??? Renal calculus   ??? Tobacco use disorder   ??? Nasal septum perforation   ??? Insomnia   ??? Other emphysema (CMS-HCC)   ??? Hepatitis C   ??? Osteoporosis of lumbar spine   ??? Proteinuria   ??? Prostate cancer (CMS-HCC)   ??? Cirrhosis (CMS-HCC)   ??? Hepatic encephalopathy (CMS-HCC)   ??? Community acquired pneumonia of right upper lobe of lung   ??? Sinusitis   ??? Neoplasm of maxillary sinus       Medications:  Reviewed in EPIC  __________________________________________________________    Physical Exam:   Vital Signs:  Vitals:    09/25/19 0904   BP: 127/60   Pulse: 66   Temp: 36.7 ??C (98.1 ??F)   TempSrc: Temporal   SpO2: 96%   Weight: 50.8 kg (112 lb)   Height: 167.6 cm (5' 6)      Body mass index is 18.08 kg/m??.    General Appearance: Alert, NAD,thin with temporal wasting  CV: normal rate, regular rhythm, no murmurs rubs or gallops.   Resp: CTAB in all lung fields, no increased WOB  GI: Multiple spider angioma at the level of the epigastrium. Soft, non-tender, non distended. NABS, no rebound or guarding. No hepatosplenomegaly.      Meridee Score, MS-3    I have verified all student documentation or findings.  I have personally performed or re-performed the physical exam and medical decision making. [name], MD

## 2019-10-02 ENCOUNTER — Encounter: Admit: 2019-10-02 | Discharge: 2019-10-03 | Payer: PRIVATE HEALTH INSURANCE | Attending: Family | Primary: Family

## 2019-10-02 DIAGNOSIS — Z720 Tobacco use: Principal | ICD-10-CM

## 2019-10-02 DIAGNOSIS — Z1321 Encounter for screening for nutritional disorder: Principal | ICD-10-CM

## 2019-10-02 DIAGNOSIS — R1314 Dysphagia, pharyngoesophageal phase: Principal | ICD-10-CM

## 2019-10-02 DIAGNOSIS — Z9189 Other specified personal risk factors, not elsewhere classified: Principal | ICD-10-CM

## 2019-10-02 DIAGNOSIS — J40 Bronchitis, not specified as acute or chronic: Principal | ICD-10-CM

## 2019-10-02 DIAGNOSIS — R9389 Abnormal findings on diagnostic imaging of other specified body structures: Principal | ICD-10-CM

## 2019-10-02 DIAGNOSIS — K7460 Unspecified cirrhosis of liver: Principal | ICD-10-CM

## 2019-10-02 LAB — BASIC METABOLIC PANEL
ANION GAP: 3 mmol/L — ABNORMAL LOW (ref 7–15)
BUN / CREAT RATIO: 25
CALCIUM: 9.5 mg/dL (ref 8.5–10.2)
CHLORIDE: 110 mmol/L — ABNORMAL HIGH (ref 98–107)
CO2: 25 mmol/L (ref 22.0–30.0)
CREATININE: 0.81 mg/dL (ref 0.70–1.30)
EGFR CKD-EPI AA MALE: >90 mL/min/{1.73_m2} (ref >=60)
EGFR CKD-EPI NON-AA MALE: >90 mL/min/{1.73_m2} (ref >=60)
GLUCOSE RANDOM: 93 mg/dL (ref 70–179)
POTASSIUM: 4.3 mmol/L (ref 3.5–5.0)
SODIUM: 138 mmol/L (ref 135–145)

## 2019-10-02 LAB — CBC W/ AUTO DIFF
BASOPHILS ABSOLUTE COUNT: 0 10*9/L (ref 0.0–0.1)
BASOPHILS RELATIVE PERCENT: 0.7 %
EOSINOPHILS ABSOLUTE COUNT: 0.1 10*9/L (ref 0.0–0.4)
EOSINOPHILS RELATIVE PERCENT: 1 %
HEMATOCRIT: 37.7 % — ABNORMAL LOW (ref 41.0–53.0)
LYMPHOCYTES ABSOLUTE COUNT: 1.4 10*9/L — ABNORMAL LOW (ref 1.5–5.0)
LYMPHOCYTES RELATIVE PERCENT: 25 %
MEAN CORPUSCULAR HEMOGLOBIN CONC: 33.3 g/dL (ref 31.0–37.0)
MEAN CORPUSCULAR HEMOGLOBIN: 35.5 pg — ABNORMAL HIGH (ref 26.0–34.0)
MEAN CORPUSCULAR VOLUME: 106.5 fL — ABNORMAL HIGH (ref 80.0–100.0)
MEAN PLATELET VOLUME: 8.5 fL (ref 7.0–10.0)
MONOCYTES ABSOLUTE COUNT: 0.3 10*9/L (ref 0.2–0.8)
MONOCYTES RELATIVE PERCENT: 6 %
PLATELET COUNT: 209 10*9/L (ref 150–440)
RED BLOOD CELL COUNT: 3.54 10*12/L — ABNORMAL LOW (ref 4.50–5.90)
RED CELL DISTRIBUTION WIDTH: 12.5 % (ref 12.0–15.0)
WBC ADJUSTED: 5.5 10*9/L (ref 4.5–11.0)

## 2019-10-02 LAB — HEPATIC FUNCTION PANEL
ALBUMIN: 4 g/dL (ref 3.5–5.0)
ALT (SGPT): 22 U/L (ref <50)
AST (SGOT): 32 U/L (ref 19–55)
BILIRUBIN DIRECT: <0.1 mg/dL (ref 0.00–0.40)

## 2019-10-02 LAB — ANION GAP: Anion gap 3:SCnc:Pt:Ser/Plas:Qn:: 3 — ABNORMAL LOW

## 2019-10-02 LAB — RED BLOOD CELL COUNT: Lab: 3.54 — ABNORMAL LOW

## 2019-10-02 LAB — INR: Coagulation tissue factor induced.INR:RelTime:Pt:PPP:Qn:Coag: 1.1

## 2019-10-02 LAB — SMEAR REVIEW: Lab: 0

## 2019-10-02 LAB — VITAMIN D, TOTAL (25OH): Lab: 46.1

## 2019-10-02 LAB — ALKALINE PHOSPHATASE: Alkaline phosphatase:CCnc:Pt:Ser/Plas:Qn:: 43

## 2019-10-02 NOTE — Unmapped (Addendum)
1. Laboratory studies done today as part of continued cirrhosis care.   2. Office follow up August with MRI of abdomen on same date.   3. Continue Lactulose 15 ml by mouth twice daily and Xifaxan 550 mg twice daily.   4. ??No alcohol use.   5. ??Healthy diet with increasing lean protein in your diet.   6. ??Any questions or concerns please notify our office.??  7.  Order placed for sleep study.   8.  Order placed for upper endoscopy.   9.  Smoking cessation discussed.   10. Need to ask pharmacist about purchasing spacer for your inhaler.   11. CT scan of chest ordered for May for follow up recent findings.
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
## 2019-10-04 LAB — ZINC: Zinc:MCnc:Pt:Ser/Plas:Qn:: 1.68 — ABNORMAL HIGH

## 2019-10-04 NOTE — Unmapped (Signed)
[facility] Refill Coordination Note    Specialty Medication(s) to be Shipped:   Infectious Disease: Xifaxan    Other medication(s) to be shipped:       Harold Richards, DOB: May 16, 1956  Phone: [phone] (home)       All above HIPAA information was verified with patient.     Was a Nurse, learning disability used for this call? No    Completed refill call assessment today to schedule patient's medication shipment from the [facility] [phone]).       Specialty medication(s) and dose(s) confirmed: Regimen is correct and unchanged.   Changes to medications: Harold Richards reports no changes at this time.  Changes to insurance: No  Questions for the pharmacist: No    Confirmed patient received Welcome Packet with first shipment. The patient will receive a drug information handout for each medication shipped and additional FDA Medication Guides as required.       DISEASE/MEDICATION-SPECIFIC INFORMATION        N/A    SPECIALTY MEDICATION ADHERENCE     Medication Adherence    Patient reported X missed doses in the last month: 0  Specialty Medication: xifaxan 550mg   Patient is on additional specialty medications: No  Informant: patient  Reliability of informant: reliable  Patient is at risk for Non-Adherence: No                Xifaxan 550 mg: 7 days of medicine on hand           SHIPPING     Shipping address confirmed in Epic.     Delivery Scheduled: Yes, Expected medication delivery date: 03/03.     Medication will be delivered via Next Day Courier to the prescription address in Epic WAM.    [name]   [facility] Specialty Technician

## 2019-10-06 DIAGNOSIS — D508 Other iron deficiency anemias: Principal | ICD-10-CM

## 2019-10-07 LAB — VITAMIN A RESULT: Retinol:MCnc:Pt:Ser/Plas:Qn:: 39.8

## 2019-10-07 NOTE — Unmapped (Signed)
Open in error

## 2019-10-08 NOTE — Unmapped (Signed)
Harold Richards 's Xifaxan shipment will be sent out  as a result of the medication is too soon to refill until 10/12/2019.     I have reached out to the patient and communicated the delivery change. We will reschedule the medication for the delivery date that the patient agreed upon.  We have confirmed the delivery date as 10/14/19, via same day courier.

## 2019-10-10 ENCOUNTER — Encounter: Admit: 2019-10-10 | Discharge: 2019-10-11 | Payer: PRIVATE HEALTH INSURANCE

## 2019-10-10 LAB — IRON & TIBC
IRON SATURATION (CALC): 74 % — ABNORMAL HIGH (ref 20–50)
TOTAL IRON BINDING CAPACITY (CALC): 280.7 mg/dL (ref 252.0–479.0)

## 2019-10-10 LAB — FERRITIN: Ferritin:MCnc:Pt:Ser/Plas:Qn:: 22.2 — ABNORMAL LOW

## 2019-10-10 LAB — FOLATE: Folate:MCnc:Pt:Ser/Plas:Qn:: 19.3

## 2019-10-10 LAB — VITAMIN B-12: Cobalamins:MCnc:Pt:Ser/Plas:Qn:: >1000 — ABNORMAL HIGH

## 2019-10-10 LAB — IRON: Iron:MCnc:Pt:Ser/Plas:Qn:: 208 — ABNORMAL HIGH

## 2019-10-14 MED FILL — XIFAXAN 550 MG TABLET: ORAL | 30 days supply | Qty: 60 | Fill #1

## 2019-10-14 MED FILL — XIFAXAN 550 MG TABLET: 30 days supply | Qty: 60 | Fill #1 | Status: AC

## 2019-10-23 NOTE — Unmapped (Signed)
RTC with pt requesting flomax refill who reports he is under the care of Dr. Carles Collet. Advised I would send his request to Dr. Carles Collet and his team; advised to call [facility] if not called in by tomorrow. This plan was agreeable with pt.

## 2019-10-24 ENCOUNTER — Encounter
Admit: 2019-10-24 | Discharge: 2019-10-25 | Payer: PRIVATE HEALTH INSURANCE | Attending: Student in an Organized Health Care Education/Training Program | Primary: Student in an Organized Health Care Education/Training Program

## 2019-10-24 DIAGNOSIS — D509 Iron deficiency anemia, unspecified: Principal | ICD-10-CM

## 2019-10-24 DIAGNOSIS — J310 Chronic rhinitis: Principal | ICD-10-CM

## 2019-10-24 MED ORDER — TAMSULOSIN 0.4 MG CAPSULE
ORAL_CAPSULE | Freq: Two times a day (BID) | ORAL | 1 refills | 90.00000 days | Status: CP
Start: 2019-10-24 — End: 2020-04-21

## 2019-10-24 NOTE — Unmapped (Signed)
Otolaryngology Clinic Note    Harold Richards is a 64 y.o. male is seen in follow up for left-sided maxillary mass.     Left maxillary mega antrostomy, left partial ethmoid, and left frontal.     History of Present Illness:     The patient is a 64 y.o. male who  has a past medical history of Asthma, Chronic headaches, Colon polyp, Enlarged prostate, GERD (gastroesophageal reflux disease) (02/08/2013), Hepatitis C, substance abuse (CMS-HCC), Infectious viral hepatitis, Lung nodule seen on imaging study (07/12/2016), Osteoporosis, Other emphysema (CMS-HCC), Renal calculus, Skin cancer, and Tobacco abuse (02/08/2013). who presents for the evaluation of chronic sinusitis.  Patient reports left sided facial pain and pressure for >1 year.  Was seen by Dr. Sherryll Burger on 7/28 and was incidental noted to have purulence from an inflammed left maxillary/ethomid region.  He was treated with 14 days of antibotics (doxy) and scheduled to have a CT scan.  Prior to the CT he was admitted to the hospital on 8/30 with a community aquired pneumonia, fevers and SOB.   He was treated with IV cephalosporins and transitioned to PO levaquin.  Following this he had his originally scheduled CT and f/u with Dr. Sherryll Burger on 9/21, despite 2 course of antibiotics he still have persistnet purulence and CT scan demonstrated left maxillary opacification concerning for a mass, extending into the anterior ethmoid.  CT scan is concerning for fungal ball or inverted papillooma given the asymmetry.  Reports his left nose from a breathing stand point is bad.  Complain of persistent headaches and some upper teeth pain (edentulous but pain on the alveolous) that all seem to be left-sided.     Still having some trouble with swallowing and choking and has seen Dr. Martina Sinner.   Has not used any steroid or rinses.  Sense of smell/taste has declined.  Denies any recent antibiotics within the last month.        Patient works for [employer]    Update 07/30/2019:  Harold Richards is a 64 y.o. male who last seen in 07/24/2019.  He reports he  has been doing  Good. He does have some chest pain the first night of surgery.  He stopped the Suboxone initially ended up taking 3 oxycodone his back on the Suboxone clinic pain is well managed.  Overall he reports he is doing well.  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 No malignancy.     Update 1//14/2020:  Harold Richards is a 64 y.o. male who last seen in 07/30/2019.  He reports he  has been doing  Much better.  His nose feels much better than prior to surgery. Less pain, less drainage, no problems.  He has completed his antibiotics.  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  His chronic pain is currently well controlled.      Patients medical records were personally reviewed.     Interval Update 10/24/2019:  Patient returns today for follow up visit. Since last seen, patient started on course of azithromycin by primary for COPD exacerbation in February.  No sinus complaints today and doing well. Continues to perform nasal rinses at least 1x a day.     A 12 point review of systems was negative except as indicated.  The patient denies fevers, chills, shortness of breath, chest pain, nausea, vomiting, diarrhea, inability to lie flat, dysphagia, odynophagia, hemoptysis, hematemesis, changes in vision, changes in voice  quality, otalgia, otorrhea, vertiginous symptoms, focal deficits, or other concerning symptoms.    Past Medical History     has a past medical history of Asthma, Chronic headaches, Colon polyp, Enlarged prostate, GERD (gastroesophageal reflux disease) (02/08/2013), Hepatitis C, substance abuse (CMS-HCC), Infectious viral hepatitis, Lung nodule seen on imaging study (07/12/2016), Osteoporosis, Other emphysema (CMS-HCC), Renal calculus, Skin cancer, and Tobacco abuse (02/08/2013).    Past Surgical History     has a past surgical history that includes Cervical fusion; pr excision submaxillary gland (Right, 03/31/2015); pr colsc flx w/rmvl of tumor polyp lesion snare tq (N/A, 10/20/2015); pr upper gi endoscopy,diagnosis (N/A, 08/18/2017); pr stereotactic comp assist proc,cranial,extradural (Left, 07/22/2019); and pr remv upper jaw-maxillectomy (Left, 07/22/2019).    Current Medications    Current Outpatient Medications   Medication Sig Dispense Refill   ??? albuterol HFA 90 mcg/actuation inhaler Inhale 2 puffs every four (4) hours as needed for wheezing. 3 Inhaler 3   ??? aspirin (ECOTRIN) 81 MG tablet Take 81 mg by mouth daily.     ??? atorvastatin (LIPITOR) 20 MG tablet Take 1 tablet (20 mg total) by mouth nightly. 90 tablet 3   ??? cholecalciferol, vitamin D3, (VITAMIN D3) 1,000 unit capsule Take 1,000 Units by mouth daily.     ??? cyanocobalamin (VITAMIN B-12) 250 MCG tablet Take 250 mcg by mouth daily.     ??? ferrous sulfate 325 mg (65 mg iron) CpER Take 1 tablet by mouth daily.      ??? fluticasone propion-salmeterol (ADVAIR DISKUS) 250-50 mcg/dose diskus Inhale 1 puff Two (2) times a day. 3 each 3   ??? guaiFENesin (MUCINEX) 600 mg 12 hr tablet Take 600 mg by mouth every morning.     ??? lactulose (CHRONULAC) 10 gram/15 mL solution Take 15 mL (10 g total) by mouth Two (2) times a day. 240 mL 0   ??? loratadine (CLARITIN) 10 mg tablet Take 10 mg by mouth daily.     ??? multivitamin (TAB-A-VITE/THERAGRAN) per tablet Take 1 tablet by mouth daily.     ??? rifAXIMin (XIFAXAN) 550 mg Tab Take 1 tablet (550 mg total) by mouth Two (2) times a day. 60 tablet 6   ??? SUBOXONE 8-2 mg sublingual film Place 1 Film under the tongue Two (2) times a day.   0   ??? vit C,E-Zn-coppr-lutein-zeaxan (PRESERVISION AREDS 2) 250-200-40-1 mg-unit-mg-mg cap Take 1 tablet by mouth Two (2) times a day.     ??? vitamin A 16109 UNIT capsule Take 10,000 Units by mouth daily.     ??? tamsulosin (FLOMAX) 0.4 mg capsule Take 1 capsule (0.4 mg total) by mouth two (2) times a day. 180 capsule 1     No current facility-administered medications for this visit.        Allergies    Allergies   Allergen Reactions   ??? Bicalutamide Rash   ??? Penicillins Rash   ??? Rocephin [Ceftriaxone] Rash     Patient received in Emergency Room and reported rash to stomach and back several hours later.        Family History    Negative for bleeding disorders or free bleeding.     family history includes Bone cancer in his mother; Heart attack in his father; Hepatitis in his sister; Hypertension in his father and mother; Lung cancer in his mother.    Social History:     reports that he has been smoking cigarettes. He started smoking about 50 years ago. He has a 12.50 pack-year  smoking history. He quit smokeless tobacco use about 12 years ago.  His smokeless tobacco use included chew.   reports no history of alcohol use.   reports previous drug use. Drugs: Cocaine and IV.    Review of Systems  A 12 system review of systems was performed and is negative other than that noted in the history of present illness.    Vital Signs  Temperature 36.2 ??C (97.2 ??F), height 167.6 cm (5' 6), weight 53.9 kg (118 lb 14.4 oz), SpO2 96 %.    Physical Exam  General: Well-developed, well-nourished. Appropriate, comfortable, and in no apparent distress.  Head/Face: On external examination there is no obvious asymmetry or scars. On palpation there is no tenderness over maxillary sinuses or masses within the salivary glands. Cranial nerves V and VII are intact through all distributions.  Eyes: PERRL, EOMI, the conjunctiva are not injected and sclera is non-icteric.  Ears: On external exam, there is no obvious lesions or asymmetry. The EACs are bilaterally without cerumen or lesions. The TMs are in the neutral position and are mobile to pneumatic otoscopy bilaterally. There are no middle ear masses or fluid noted. Hearing is grossly intact bilaterally.  Nose: On external exam there are neither lesions nor asymmetry of the nasal tip/ dorsum. On anterior rhinoscopy, visualization posteriorly is limited on anterior examination. For this reason, to adequately evaluate posteriorly for masses, polypoid disease and/or signs of infections, nasal endoscopy is indicated (see procedure below).  Oral cavity/oropharynx: The mucosa of the lips, gums, hard and soft palate, posterior pharyngeal wall, tongue, floor of mouth, and buccal region are without masses or lesions and are normally hydrated. Good dentition. Tongue protrudes midline. Tonsils are normal appearing. Supraglottis not visualized due to gag reflex.  Neck: There is no asymmetry or masses. Trachea is midline. There is no enlargement of the thyroid or palpable thyroid nodules.   Lymphatics: There is no palpable lymphadenopathy along the jugulodiagastric, submental, or posterior cervical chains.  Chest: No audible wheeze, unlabored respirations.  Cardiovascular: Regular rate.  GI: Nondistended.  Neurologic: Cranial nerve???s II-XII are grossly intact. Exam is non-focal.  Extremities: No cyanosis, clubbing or edema.    Sinonasal Endoscopy  (CPT [id number]): Due to the patient???s chronic sinusitis/chronic rhinitis, sinonasal endoscopy is indicated.  After discussion of risks and benefits, and topical decongestion and anesthesia, an endoscope was used to perform nasal endoscopy. Suctions and Tobey forceps were used to remove crust and fibrin debris from the affected sinuses without complications.   A time out identifying the patient, the procedure, the location of the procedure and any concerns was performed prior to beginning the procedure.    Purulence to left maxillary sinus. Suctioned and crust removed. Frontal sinus widely patent.  Right side normal. Mild edema.  Septal deviation.     On the right side - mild edema - SE is clear. No polyps or purulence.     Oretha Ellis Nasal Endoscopy Score: [employer] is used to assess the degree of inflammation of the sinonasal structures, including the middle and superior turbinates, the ethmoid sinuses, maxillary sinuses, frontal sinuses, and sphenoid sinuses.  In the presence of previous surgery, some or all of these structures may be absent.    Left        ?? Polyps:  Absent (0)   ?? Edema:   Mild (1)   ?? Discharge:  Thick, Purulent (2)    ?? Scarring:  Absent (0)   ?? Crusting:  None (  0)      Total Left:  3     Right         ?? Polyps:  Absent (0)   ?? Edema:  Mild (1)   ?? Discharge: None (0)    ?? Scarring:  Absent (0)   ?? Crusting:  None (0)      Total Right:   1      Labs and Diagnostic Tests  CT scan was reviewed demonstrates left-sided maxillary opacification edentulous on the left. Osteitic change within the maxillary sinus concerning for a tumor either benign or malignant.        Assessment:  The patient is a 64 y.o. male who  has a past medical history of Asthma, Chronic headaches, Colon polyp, Enlarged prostate, GERD (gastroesophageal reflux disease) (02/08/2013), Hepatitis C, substance abuse (CMS-HCC), Infectious viral hepatitis, Lung nodule seen on imaging study (07/12/2016), Osteoporosis, Other emphysema (CMS-HCC), Renal calculus, Skin cancer, and Tobacco abuse (02/08/2013). who presents for the evaluation of: Left-sided maxillary tumor.      Recommendations:  1.  Patient status post removal of left-sided maxillary tumor.  Pathology demonstrated a benign fungal ball.  No concerns of malignancy. Doing well today, without sinonasal complaints this morning. Continue to encourage regular nasal irrigations.      Will plan on seeing back in 3-4 months for repeat exam.     The patient voiced complete understanding of plan as detailed above and is in full agreement.    Scribe's Attestation: Junius Finner, MD obtained and performed the history, physical exam and medical decision making elements that were entered into the chart.  Signed by Jerl Santos, Scribe, on October 24, 2019 9:46 AM.    ----------------------------------------------------------------------------------------------------------------------  October 24, 2019 9:49 AM. Documentation assistance provided by the Scribe. I was present during the time the encounter was recorded. The information recorded by the Scribe was done at my direction and has been reviewed and validated by me.  ----------------------------------------------------------------------------------------------------------------------

## 2019-10-30 ENCOUNTER — Encounter: Admit: 2019-10-30 | Discharge: 2019-10-31 | Payer: PRIVATE HEALTH INSURANCE

## 2019-10-30 LAB — CBC W/ AUTO DIFF
BASOPHILS RELATIVE PERCENT: 1.3 %
EOSINOPHILS ABSOLUTE COUNT: 0.2 10*9/L (ref 0.0–0.7)
EOSINOPHILS RELATIVE PERCENT: 2.4 %
HEMATOCRIT: 39.2 % (ref 38.0–50.0)
HEMOGLOBIN: 13.3 g/dL — ABNORMAL LOW (ref 13.5–17.5)
LYMPHOCYTES ABSOLUTE COUNT: 1.8 10*9/L (ref 0.7–4.0)
MEAN CORPUSCULAR HEMOGLOBIN: 34.6 pg — ABNORMAL HIGH (ref 26.0–34.0)
MEAN CORPUSCULAR VOLUME: 101.9 fL — ABNORMAL HIGH (ref 81.0–95.0)
MEAN PLATELET VOLUME: 6.8 fL — ABNORMAL LOW (ref 7.0–10.0)
MONOCYTES ABSOLUTE COUNT: 0.6 10*9/L (ref 0.1–1.0)
NEUTROPHILS ABSOLUTE COUNT: 3.9 10*9/L (ref 1.7–7.7)
NEUTROPHILS RELATIVE PERCENT: 59.3 %
NUCLEATED RED BLOOD CELLS: 0 /100{WBCs} (ref <=4)
PLATELET COUNT: 202 10*9/L (ref 150–450)
RED BLOOD CELL COUNT: 3.85 10*12/L — ABNORMAL LOW (ref 4.32–5.72)
RED CELL DISTRIBUTION WIDTH: 12.3 % (ref 12.0–15.0)
WBC ADJUSTED: 6.5 10*9/L (ref 3.5–10.5)

## 2019-10-30 LAB — HEMOGLOBIN: Hemoglobin:MCnc:Pt:Bld:Qn:: 13.3 — ABNORMAL LOW

## 2019-10-30 LAB — IRON & TIBC
IRON SATURATION (CALC): 38 % (ref 20–50)
IRON: 108 ug/dL (ref 35–165)

## 2019-10-30 LAB — IRON: Iron:MCnc:Pt:Ser/Plas:Qn:: 108

## 2019-10-30 LAB — FERRITIN: Ferritin:MCnc:Pt:Ser/Plas:Qn:: 21.4 — ABNORMAL LOW

## 2019-11-02 ENCOUNTER — Encounter: Admit: 2019-11-02 | Discharge: 2019-11-04 | Payer: PRIVATE HEALTH INSURANCE

## 2019-11-04 NOTE — Unmapped (Signed)
Spoke with patient's wife, Harold Richards and reviewed her husband's sleep study report. Findings consistent with moderate obstructive apnea. Encouraged her to reach out to his PCP about proceeding forward with management of apnea. She is in agreement. If any concerns she will let me know. Unfortunately she has been diagnosed with breast cancer. Undergoing chemotherapy, pending surgery and additional chemo and radiation.

## 2019-11-05 NOTE — Unmapped (Signed)
[facility] Refill Coordination Note    Specialty Medication(s) to be Shipped:   Infectious Disease: Xifaxan    Other medication(s) to be shipped: n/a     Harold Richards, DOB: [birth year]  Phone: [phone] (home)       All above HIPAA information was verified with patient.     Was a Nurse, learning disability used for this call? No    Completed refill call assessment today to schedule patient's medication shipment from the [facility] [phone]).       Specialty medication(s) and dose(s) confirmed: Regimen is correct and unchanged.   Changes to medications: Harold Richards reports no changes at this time.  Changes to insurance: No  Questions for the pharmacist: No    Confirmed patient received Welcome Packet with first shipment. The patient will receive a drug information handout for each medication shipped and additional FDA Medication Guides as required.       DISEASE/MEDICATION-SPECIFIC INFORMATION        N/A    SPECIALTY MEDICATION ADHERENCE     Medication Adherence    Patient reported X missed doses in the last month: 0  Specialty Medication: xifaxan 550mg            unable to confirm quantity on hand.      SHIPPING     Shipping address confirmed in Epic.     Delivery Scheduled: Yes, Expected medication delivery date: 4/5.     Medication will be delivered via Same Day Courier to the prescription address in Epic WAM.    Harold Richards   [facility] Specialty Technician

## 2019-11-06 ENCOUNTER — Encounter: Admit: 2019-11-06 | Discharge: 2019-11-07 | Payer: PRIVATE HEALTH INSURANCE | Attending: Family | Primary: Family

## 2019-11-06 ENCOUNTER — Encounter: Admit: 2019-11-06 | Discharge: 2019-11-07 | Payer: PRIVATE HEALTH INSURANCE

## 2019-11-06 NOTE — Unmapped (Signed)
Pt presents for Reclast infusion.  VSS, premeds administered.  Pt aware of potential reaction/side effects, call bell within reach.  2956 Reclast started  1009 Reclast complete.  Pt tolerated without complication, VSS.  IV flushed per policy and d/c'd, gauze and coban applied.  Pt left clinic in no acute distress.

## 2019-11-06 NOTE — Unmapped (Signed)
COVID Pre-Procedure Intake Form     Assessment     Harold Richards is a 64 y.o. male presenting to [facility] for COVID testing.     Plan     If no testing performed, pt counseled on routine care for respiratory illness.  If testing performed, COVID sent.  Patient directed to Home given findings during today's visit.    Subjective     Harold Richards is a 64 y.o. male who [facility] with complaints of the following:    Exposure History: In the last 21 days?     Have you traveled outside of [place]? No               Have you been in close contact with someone confirmed by a test to have COVID? (Close contact is within 6 feet for at least 10 minutes) No       Have you worked in a health care facility? No     Lived or worked facility like a nursing home, group home, or assisted living?    No         Are you scheduled to have surgery or a procedure in the next 3 days? Yes               Are you scheduled to receive cancer chemotherapy within the next 7 days?    No     Have you ever been tested before for COVID-19 with a swab of your nose? Yes: When: n/a, Where: n/a   Are you a healthcare worker being tested so to return to work No     Right now,  do you have any of the following that developed over the past 7 days (as stated by patient on intake form):    Subjective fever (felt feverish) No   Chills (especially repeated shaking chills) No   Severe fatigue (felt very tired) No   Muscle aches No   Runny nose No   Sore throat No   Loss of taste or smell No   Cough (new onset or worsening of chronic cough) No   Shortness of breath No   Nausea or vomiting No   Headache No   Abdominal Pain No   Diarrhea (3 or more loose stools in last 24 hours) No       Scribe's Attestation: Paulita Fujita, FNP obtained and performed the history, physical exam and medical decision making elements that were entered into the chart.  Signed by Salomon Mast serving as Scribe, on 11/06/2019 11:54 AM      The documentation recorded by the scribe accurately reflects the service I personally performed and the decisions made by me. Aida Puffer, FNP  November 07, 2019 10:32 AM

## 2019-11-08 ENCOUNTER — Encounter
Admit: 2019-11-08 | Discharge: 2019-11-08 | Payer: PRIVATE HEALTH INSURANCE | Attending: Certified Registered" | Primary: Certified Registered"

## 2019-11-08 ENCOUNTER — Encounter: Admit: 2019-11-08 | Discharge: 2019-11-08 | Payer: PRIVATE HEALTH INSURANCE

## 2019-11-11 MED FILL — XIFAXAN 550 MG TABLET: 30 days supply | Qty: 60 | Fill #2 | Status: AC

## 2019-11-11 MED FILL — XIFAXAN 550 MG TABLET: ORAL | 30 days supply | Qty: 60 | Fill #2

## 2019-11-11 NOTE — Unmapped (Signed)
ASSESSMENT / PLAN    1. Osteoporosis of lumbar spine  He has now received 3x Reclast - his next will be next March -  We will delay getting DXA until when holiday expected - so that will be Reclast 2022, holiday expected starting Mar 2023    2. Cirrhosis (CMS-HCC)  Stable MELD 7 but has started on lactulose.  Intent is to get to retirement, when hopefully mgmt will be easier    3. Prostate cancer (CMS-HCC)  On Lupron - plan was for at least 18 months    RTC next year - should be before his Reclast so can assess. Will not need DXA      +++++++++++++++++++++++++++++++++++++++++++++    SUBJECTIVE    HPI: Harold Richards is a 64 y.o. male with osteoporosis/ cirrhosis/ prostate (lupron) Ca last seen 09-2018    No new medical problems.  Still on Lupron. 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   Planning to retire October 2.   Wife just dx with breast cancer - getting chemo.    1. Osteoporosis - Multiple contributors potential for spinal osteoporosis - including cirrhosis (testosterone), kidney stones (PTH, renal leak)   I had prescribed Reclast first 2018 for probably 4 years  2020 - Nice result with first 2 years of Reclast.  3rd infusion 10-09-2019 downstairs    Bone History  Rib fracture x 2 in 2015 - fell off truck  Still working - does okay.  Is exhausted when he is not at work.  Sexual function is decreased - able to have erection. No change in shaving.  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 Proximal left femur T score is -1.8. Femoral neck density T score is -1.7. ??  08/10/2018 2 years Reclast      L1 to 4  -2.3.  significant increase of 3.5%      Proximal left femur -1.9.  Femoral neck  -1.9.       2. Gastroesophageal reflux disease    3. 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 Prostate cancer x2019 Dx: high??risk prostate cancer, cT1c, Gleason??4+4=8??in 2/12??cores, PSA 4.59. EBRT x 5 wks + Brachytherapy + 18 mon ADT.    Also Lupron q 3 months.    Pertinent Medical History:   Sialolithiasis of submandibular gland - excised 2016  Positive recreational drug use (cocaine IV, intranasal and crack) --clean x 10 years  Kidney stones -  Last was 2015 x 2 -  No family history. No scan  2017  ALK 50, calcium 9.6, MCV 106, Vitamin D 55    REVIEW OF SYSTEMS:negative except for as stated in HPI.  Note stones.  Hepatitis C - with some degree of compensated cirrhosis but patient does not have any symptoms.    Social History:smoker, married. Grown children 30s - 2 grandchildren.  Works at [employer] in [employer]    Family history: parents - none with hip fracture - grandmother with hip fracture.  Mother lung cancer death, dad still living age 63  Brother deceased age 8, Sister at age 17 - both car accidents.      Current Outpatient Medications:   ???  albuterol HFA 90 mcg/actuation inhaler, Inhale 2 puffs every four (4) hours as needed for wheezing., Disp:  3 Inhaler, Rfl: 3  ???  aspirin (ECOTRIN) 81 MG tablet, Take 81 mg by mouth daily., Disp: , Rfl:   ???  atorvastatin (LIPITOR) 20 MG tablet, Take 1 tablet (20 mg total) by mouth nightly., Disp: 90 tablet, Rfl: 3  ???  cholecalciferol, vitamin D3, (VITAMIN D3) 1,000 unit capsule, Take 1,000 Units by mouth daily., Disp: , Rfl:   ???  cyanocobalamin (VITAMIN B-12) 250 MCG tablet, Take 250 mcg by mouth daily., Disp: , Rfl:   ???  ferrous sulfate 325 mg (65 mg iron) CpER, Take 1 tablet by mouth daily. , Disp: , Rfl:   ???  fluticasone propion-salmeterol (ADVAIR DISKUS) 250-50 mcg/dose diskus, Inhale 1 puff Two (2) times a day., Disp: 3 each, Rfl: 3  ???  guaiFENesin (MUCINEX) 600 mg 12 hr tablet, Take 600 mg by mouth every morning., Disp: , Rfl:   ???  lactulose (CHRONULAC) 10 gram/15 mL solution, Take 15 mL (10 g total) by mouth Two (2) times a day., Disp: 240 mL, Rfl: 0  ???  loratadine (CLARITIN) 10 mg tablet, Take 10 mg by mouth daily., Disp: , Rfl:   ???  multivitamin (TAB-A-VITE/THERAGRAN) per tablet, Take 1 tablet by mouth daily., Disp: , Rfl:   ???  rifAXIMin (XIFAXAN) 550 mg Tab, Take 1 tablet (550 mg total) by mouth Two (2) times a day., Disp: 60 tablet, Rfl: 6  ???  SUBOXONE 8-2 mg sublingual film, Place 1 Film under the tongue Two (2) times a day. , Disp: , Rfl: 0  ???  tamsulosin (FLOMAX) 0.4 mg capsule, Take 1 capsule (0.4 mg total) by mouth two (2) times a day., Disp: 180 capsule, Rfl: 1  ???  vit C,E-Zn-coppr-lutein-zeaxan (PRESERVISION AREDS 2) 250-200-40-1 mg-unit-mg-mg cap, Take 1 tablet by mouth Two (2) times a day., Disp: , Rfl:     +++++++++++++++++++    OBJECTIVE  BP 123/60  - Pulse 63  - Resp 18  - Ht 167.6 cm (5' 5.98)  - Wt 55.2 kg (121 lb 12.8 oz)  - BMI 19.67 kg/m??      GENERAL: Patient appears stated age, in NAD  Tremor: no  EOM: Grossly intact.  Skin: normal, with normal hair distribution   NEUROLOGIC: grossly intact.   PSYCHIATRIC: mood and affect were appropriate    Pertinent labs:  Office Visit on 11/06/2019   Component Date Value Ref Range Status   ??? SARS-CoV-2 PCR 11/06/2019 Not Detected  Not Detected Final   Appointment on 10/30/2019   Component Date Value Ref Range Status   ??? Iron 10/30/2019 108  35 - 165 ug/dL Final   ??? TIBC 16/05/9603 287.8  252.0 - 479.0 mg/dL Final   ??? Transferrin 10/30/2019 228.4  200.0 - 380.0 mg/dL Final   ??? Iron Saturation (%) 10/30/2019 38  20 - 50 % Final   ??? Ferritin 10/30/2019 21.4* 27.0 - 377.0 ng/mL Final   ??? WBC 10/30/2019 6.5  3.5 - 10.5 10*9/L Final   ??? RBC 10/30/2019 3.85* 4.32 - 5.72 10*12/L Final   ??? HGB 10/30/2019 13.3* 13.5 - 17.5 g/dL Final   ??? HCT 54/04/8118 39.2  38.0 - 50.0 % Final   ??? MCV 10/30/2019 101.9* 81.0 - 95.0 fL Final   ??? MCH 10/30/2019 34.6* 26.0 - 34.0 pg Final   ??? MCHC 10/30/2019 34.0  30.0 - 36.0 g/dL Final   ??? RDW 14/78/2956 12.3  12.0 - 15.0 % Final   ??? MPV 10/30/2019 6.8* 7.0 - 10.0 fL Final   ???  Platelet 10/30/2019 202  150 - 450 10*9/L Final   ??? nRBC 10/30/2019 0  <=4 /100 WBCs Final   ??? Neutrophils % 10/30/2019 59.3  % Final   ??? Lymphocytes % 10/30/2019 27.6  % Final   ??? Monocytes % 10/30/2019 9.4  % Final   ??? Eosinophils % 10/30/2019 2.4  % Final   ??? Basophils % 10/30/2019 1.3  % Final   ??? Absolute Neutrophils 10/30/2019 3.9  1.7 - 7.7 10*9/L Final   ??? Absolute Lymphocytes 10/30/2019 1.8  0.7 - 4.0 10*9/L Final   ??? Absolute Monocytes 10/30/2019 0.6  0.1 - 1.0 10*9/L Final   ??? Absolute Eosinophils 10/30/2019 0.2  0.0 - 0.7 10*9/L Final   ??? Absolute Basophils 10/30/2019 0.1  0.0 - 0.1 10*9/L Final       Pertinent Radiology:  2020

## 2019-11-12 DIAGNOSIS — G4733 Obstructive sleep apnea (adult) (pediatric): Principal | ICD-10-CM

## 2019-11-12 DIAGNOSIS — J449 Chronic obstructive pulmonary disease, unspecified: Principal | ICD-10-CM

## 2019-11-13 ENCOUNTER — Encounter
Admit: 2019-11-13 | Discharge: 2019-11-14 | Payer: PRIVATE HEALTH INSURANCE | Attending: "Endocrinology | Primary: "Endocrinology

## 2019-11-13 DIAGNOSIS — C61 Malignant neoplasm of prostate: Principal | ICD-10-CM

## 2019-11-13 DIAGNOSIS — K7469 Other cirrhosis of liver: Principal | ICD-10-CM

## 2019-11-13 DIAGNOSIS — M818 Other osteoporosis without current pathological fracture: Principal | ICD-10-CM

## 2019-11-13 NOTE — Unmapped (Signed)
You had 3rd Reclast 10-2019 - you were at [employer] so the treatment is prevention/continued - plan is total 4 doses then holiday and watch..  See me next year and you could ask for Reclast the same day if you can arrange - but try to see me first.    Exercise is good for everything.

## 2019-11-14 NOTE — Unmapped (Signed)
Patient letter written stating that patient is able to resume his normal activities of employment on November 18, 2019. Letter mailed to patient. Patient notified of letter being present in his chart and having been placed in the mail.

## 2019-11-27 ENCOUNTER — Encounter
Admit: 2019-11-27 | Discharge: 2019-12-06 | Payer: PRIVATE HEALTH INSURANCE | Attending: Radiation Oncology | Primary: Radiation Oncology

## 2019-11-27 ENCOUNTER — Encounter: Admit: 2019-11-27 | Discharge: 2019-12-06 | Payer: PRIVATE HEALTH INSURANCE

## 2019-11-27 DIAGNOSIS — C61 Malignant neoplasm of prostate: Principal | ICD-10-CM

## 2019-11-27 DIAGNOSIS — Z923 Personal history of irradiation: Principal | ICD-10-CM

## 2019-11-27 NOTE — Unmapped (Signed)
Pt received Eligard injection in left lower abdomen - pt tolerated well.    AVS given.    Pt stable and ambulated independently from clinic.

## 2019-11-27 NOTE — Unmapped (Signed)
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 PSA not checked today but has been undetectable- he would like to check every other visit as PSA should remain undetectable while on lupron.  We discussed that PSA can rise as testosterone recovers after he finishes ADT  2. ADT:  Due for leuprolide today- This will be his 6th (and final) 3 month injection in addition to a 1 month injection he received due to a lupron shortage a few months ago.  In total this is 19 months of ADT.  ADT is now complete  3. FOLLOW-UP:  He will return in 3 months for routine follow-up  4. GU:  Stable- Continue flomax BID  5. GI:  No issues- colonoscopy in 2017 with recommendation for next scope in 2022    INTERVAL HISTORY:      Overall doing well today.  He continues on flomax BID and with this his urinary issues are stable with mild daytime frequency and nocturia 2-3x  If he misses a dose he does have worsening of these symptoms.  Bowels are at baseline- he takes lactulose and with this he has some loose stools but no pain, no bleeding.  His wife has been diagnosed with breast cancer and is receiving neoadjuvant chemo which is tough.  His 5yo grandson has developed some sort of tic that is currently being evaluated which is also difficult.  He plans on retiring from [employer] this Fall and is looking forward to spending more time with family.    Baseline  General:??Very fatigued. No ED  Urinary:??On flomax, post-void residual 0 cc.??Has frequency, nocturia 2-3x/night, no urgency, hematuria, dysuria.??  GI: Denies loose stools, rectal pain, or bleeding.   Colonoscopy:??Done 10/2015, found polyp. ??Repeat in 5 years.    REVIEW OF SYSTEMS:  A comprehensive review of 10 systems was negative except for pertinent positives noted in HPI.    PAST MEDICAL HISTORY/FAMILY HISTORY/SOCIAL HISTORY:  Reviewed in EPIC    ALLERGIES/MEDICATIONS:  Reviewed in EPIC    PHYSICAL EXAM:  Vital Signs for this encounter:   BP 129/65  - Pulse 60  - Wt 56.2 kg (123 lb 14.4 oz)  - SpO2 98%  - BMI 20.01 kg/m??   Karnofsky/Lansky Performance Status: 90,  Able to carry on normal activity; minor signs or symptoms of disease (ECOG equivalent 0)  General:   No acute distress, alert and oriented X 4   Head: Normocephalic, without obvious abnormality, atraumatic  Eyes: EOMI, no scleral icterus  Lungs: Normal work of breathing  Heart: regular rate and rhythm, S1, S2 normal, no murmur, click, rub or gallop  Abdomen: non-distended  Extremities: extremities normal, atraumatic, no cyanosis or edema  Lymph nodes: No palpable supraclavicular or cervical lymphadenopathy  Neurologic: Grossly normal   Rectal: Deferred  given recent brachytherapy      RADIOLOGY:  No new imaging to review    Labs:    PSA   Date Value Ref Range Status   08/28/2019 <0.10 0.00 - 4.00 ng/mL Final   01/16/2019 <0.10 0.00 - 4.00 ng/mL Final   05/18/2018 4.59 (H) 0.00 - 4.00 ng/mL Final   10/26/2017 3.88 0.00 - 4.00 ng/mL Final   07/27/2017 4.15 (H) 0.00 - 4.00 ng/mL Final         Rayetta Humphrey, MD  Assistant Professor  [facility] Dept of Radiation Oncology  11/27/2019

## 2019-11-27 NOTE — Unmapped (Signed)
Here to fu with Dr Kathryne Eriksson.

## 2019-12-09 ENCOUNTER — Encounter: Admit: 2019-12-09 | Discharge: 2019-12-09 | Payer: PRIVATE HEALTH INSURANCE

## 2019-12-09 NOTE — Unmapped (Signed)
[facility] Refill Coordination Note    Specialty Medication(s) to be Shipped:   Infectious Disease: Xifaxan    Other medication(s) to be shipped: n/a     Harold Richards, DOB: 19-Nov-1955  Phone: [phone] (home)       All above HIPAA information was verified with patient.     Was a Nurse, learning disability used for this call? No    Completed refill call assessment today to schedule patient's medication shipment from the [facility] [phone]).       Specialty medication(s) and dose(s) confirmed: Regimen is correct and unchanged.   Changes to medications: Harold Richards reports no changes at this time.  Changes to insurance: No  Questions for the pharmacist: No    Confirmed patient received Welcome Packet with first shipment. The patient will receive a drug information handout for each medication shipped and additional FDA Medication Guides as required.       DISEASE/MEDICATION-SPECIFIC INFORMATION        N/A    SPECIALTY MEDICATION ADHERENCE     Medication Adherence    Patient reported X missed doses in the last month: 0  Specialty Medication: xifaxan 550mg             Unable to confirm quantity on hand.      SHIPPING     Shipping address confirmed in Epic.     Delivery Scheduled: Yes, Expected medication delivery date: 5/5.     Medication will be delivered via Next Day Courier to the prescription address in Epic WAM.    Harold Richards   [facility] Specialty Technician

## 2019-12-10 DIAGNOSIS — R911 Solitary pulmonary nodule: Principal | ICD-10-CM

## 2019-12-10 NOTE — Unmapped (Signed)
Spoke with patient wife and reviewed her husband's recent CT scan of chest result. Will proceed with repeating CT scan of chest in three months per radiologist recommendation. Close monitoring of right apical 0.6 cm nodular opacity unchanged compared to 09/11/2019, but new compared to 08/13/2018: Recommend follow-up CT chest in 3 months to assess stability. Order placed.

## 2019-12-11 MED FILL — XIFAXAN 550 MG TABLET: ORAL | 30 days supply | Qty: 60 | Fill #3

## 2019-12-11 MED FILL — XIFAXAN 550 MG TABLET: 30 days supply | Qty: 60 | Fill #3 | Status: AC

## 2019-12-21 ENCOUNTER — Encounter: Admit: 2019-12-21 | Discharge: 2019-12-23 | Payer: PRIVATE HEALTH INSURANCE

## 2019-12-31 DIAGNOSIS — G4733 Obstructive sleep apnea (adult) (pediatric): Principal | ICD-10-CM

## 2020-01-02 NOTE — Unmapped (Signed)
[facility] Refill Coordination Note    Specialty Medication(s) to be Shipped:   Infectious Disease: Xifaxan    Other medication(s) to be shipped: n/a     Harold Richards, DOB: [birth year]  Phone: [phone] (home)       All above HIPAA information was verified with patient.     Was a Nurse, learning disability used for this call? No    Completed refill call assessment today to schedule patient's medication shipment from the [facility] [phone]).       Specialty medication(s) and dose(s) confirmed: Regimen is correct and unchanged.   Changes to medications: Harold Richards reports no changes at this time.  Changes to insurance: No  Questions for the pharmacist: No    Confirmed patient received Welcome Packet with first shipment. The patient will receive a drug information handout for each medication shipped and additional FDA Medication Guides as required.       DISEASE/MEDICATION-SPECIFIC INFORMATION        N/A    SPECIALTY MEDICATION ADHERENCE     Medication Adherence    Patient reported X missed doses in the last month: 0  Specialty Medication: xifaxan 550mg             Unable to confirm quantity on hand.      SHIPPING     Shipping address confirmed in Epic.     Delivery Scheduled: Yes, Expected medication delivery date: 6/3.     Medication will be delivered via Next Day Courier to the prescription address in Epic WAM.    [name]   [facility] Specialty Technician

## 2020-01-08 MED FILL — XIFAXAN 550 MG TABLET: 30 days supply | Qty: 60 | Fill #4 | Status: AC

## 2020-01-08 MED FILL — XIFAXAN 550 MG TABLET: ORAL | 30 days supply | Qty: 60 | Fill #4

## 2020-01-30 ENCOUNTER — Encounter
Admit: 2020-01-30 | Discharge: 2020-01-31 | Payer: PRIVATE HEALTH INSURANCE | Attending: Student in an Organized Health Care Education/Training Program | Primary: Student in an Organized Health Care Education/Training Program

## 2020-01-30 DIAGNOSIS — J310 Chronic rhinitis: Principal | ICD-10-CM

## 2020-01-30 NOTE — Unmapped (Signed)
Otolaryngology Clinic Note    Harold Richards is a 64 y.o. male is seen in follow up for left-sided maxillary mass.     Left maxillary mega antrostomy, left partial ethmoid, and left frontal.     History of Present Illness:     The patient is a 64 y.o. male who  has a past medical history of Asthma, Chronic headaches, Colon polyp, Enlarged prostate, GERD (gastroesophageal reflux disease) (02/08/2013), Hepatitis C, substance abuse (CMS-HCC), Infectious viral hepatitis, Lung nodule seen on imaging study (07/12/2016), Osteoporosis, Other emphysema (CMS-HCC), Renal calculus, Skin cancer, and Tobacco abuse (02/08/2013). who presents for the evaluation of chronic sinusitis.  Patient reports left sided facial pain and pressure for >1 year.  Was seen by Dr. Sherryll Burger on 7/28 and was incidental noted to have purulence from an inflammed left maxillary/ethomid region.  He was treated with 14 days of antibotics (doxy) and scheduled to have a CT scan.  Prior to the CT he was admitted to the hospital on 8/30 with a community aquired pneumonia, fevers and SOB.   He was treated with IV cephalosporins and transitioned to PO levaquin.  Following this he had his originally scheduled CT and f/u with Dr. Sherryll Burger on 9/21, despite 2 course of antibiotics he still have persistnet purulence and CT scan demonstrated left maxillary opacification concerning for a mass, extending into the anterior ethmoid.  CT scan is concerning for fungal ball or inverted papillooma given the asymmetry.  Reports his left nose from a breathing stand point is bad.  Complain of persistent headaches and some upper teeth pain (edentulous but pain on the alveolous) that all seem to be left-sided.     Still having some trouble with swallowing and choking and has seen Dr. Martina Sinner.   Has not used any steroid or rinses.  Sense of smell/taste has declined.  Denies any recent antibiotics within the last month.        Patient works for [employer]    Update 07/30/2019:  Harold Richards is a 64 y.o. male who last seen in 07/24/2019.  He reports he  has been doing  Good. He does have some chest pain the first night of surgery.  He stopped the Suboxone initially ended up taking 3 oxycodone his back on the Suboxone clinic pain is well managed.  Overall he reports he is doing well.  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 No malignancy.     Update 1//14/2020:  Harold Richards is a 64 y.o. male who last seen in 07/30/2019.  He reports he  has been doing  Much better.  His nose feels much better than prior to surgery. Less pain, less drainage, no problems.  He has completed his antibiotics.  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  His chronic pain is currently well controlled.      Patients medical records were personally reviewed.     Interval Update 10/24/2019:  Patient returns today for follow up visit. Since last seen, patient started on course of azithromycin by primary for COPD exacerbation in February.  No sinus complaints today and doing well. Continues to perform nasal rinses at least 1x a day.     Interval Update 01/30/2020:  Harold Richards is a 64 y.o. male last seen on 10/24/2019. He returns for surveillance s/p removal of left-sided maxillary tumor. Today, he reports he is doing very well. Continues saline rinses once per day.  He recently retired and now spends his time with his three grandchildren.     A 12 point review of systems was negative except as indicated.  The patient denies fevers, chills, shortness of breath, chest pain, nausea, vomiting, diarrhea, inability to lie flat, dysphagia, odynophagia, hemoptysis, hematemesis, changes in vision, changes in voice quality, otalgia, otorrhea, vertiginous symptoms, focal deficits, or other concerning symptoms.    Past Medical History     has a past medical history of Asthma, Chronic headaches, Colon polyp, Enlarged prostate, GERD (gastroesophageal reflux disease) (02/08/2013), Hepatitis C, substance abuse (CMS-HCC), Infectious viral hepatitis, Lung nodule seen on imaging study (07/12/2016), Osteoporosis, Other emphysema (CMS-HCC), Renal calculus, Skin cancer, and Tobacco abuse (02/08/2013).    Past Surgical History     has a past surgical history that includes Cervical fusion; pr excision submaxillary gland (Right, 03/31/2015); pr colsc flx w/rmvl of tumor polyp lesion snare tq (N/A, 10/20/2015); pr upper gi endoscopy,diagnosis (N/A, 08/18/2017); pr stereotactic comp assist proc,cranial,extradural (Left, 07/22/2019); pr remv upper jaw-maxillectomy (Left, 07/22/2019); and pr upper gi endoscopy,diagnosis (N/A, 11/08/2019).    Current Medications    Current Outpatient Medications   Medication Sig Dispense Refill   ??? albuterol HFA 90 mcg/actuation inhaler Inhale 2 puffs every four (4) hours as needed for wheezing. 3 Inhaler 3   ??? aspirin (ECOTRIN) 81 MG tablet Take 81 mg by mouth daily.     ??? atorvastatin (LIPITOR) 20 MG tablet Take 1 tablet (20 mg total) by mouth nightly. 90 tablet 3   ??? cholecalciferol, vitamin D3, (VITAMIN D3) 1,000 unit capsule Take 1,000 Units by mouth daily.     ??? cyanocobalamin (VITAMIN B-12) 250 MCG tablet Take 250 mcg by mouth daily.     ??? ferrous sulfate 325 mg (65 mg iron) CpER Take 1 tablet by mouth daily.      ??? fluticasone propion-salmeterol (ADVAIR DISKUS) 250-50 mcg/dose diskus Inhale 1 puff Two (2) times a day. 3 each 3   ??? guaiFENesin (MUCINEX) 600 mg 12 hr tablet Take 600 mg by mouth every morning.     ??? lactulose (CHRONULAC) 10 gram/15 mL solution Take 15 mL (10 g total) by mouth Two (2) times a day. 240 mL 0   ??? loratadine (CLARITIN) 10 mg tablet Take 10 mg by mouth daily.     ??? multivitamin (TAB-A-VITE/THERAGRAN) per tablet Take 1 tablet by mouth daily.     ??? rifAXIMin (XIFAXAN) 550 mg Tab Take 1 tablet (550 mg total) by mouth Two (2) times a day. 60 tablet 6   ??? sertraline (ZOLOFT) 25 MG tablet      ??? SUBOXONE 8-2 mg sublingual film Place 1 Film under the tongue Two (2) times a day.   0   ??? tamsulosin (FLOMAX) 0.4 mg capsule Take 1 capsule (0.4 mg total) by mouth two (2) times a day. 180 capsule 1   ??? vit C,E-Zn-coppr-lutein-zeaxan (PRESERVISION AREDS 2) 250-200-40-1 mg-unit-mg-mg cap Take 1 tablet by mouth Two (2) times a day.       No current facility-administered medications for this visit.       Allergies    Allergies   Allergen Reactions   ??? Bicalutamide Rash   ??? Penicillins Rash   ??? Rocephin [Ceftriaxone] Rash     Patient received in Emergency Room and reported rash to stomach and back several hours later.        Family History    Negative for bleeding disorders or free bleeding.     family  history includes Bone cancer in his mother; Heart attack in his father; Hepatitis in his sister; Hypertension in his father and mother; Lung cancer in his mother.    Social History:     reports that he has been smoking cigarettes. He started smoking about 50 years ago. He has a 12.50 pack-year smoking history. He quit smokeless tobacco use about 12 years ago.  His smokeless tobacco use included chew.   reports no history of alcohol use.   reports previous drug use. Drugs: Cocaine and IV.    Review of Systems  A 12 system review of systems was performed and is negative other than that noted in the history of present illness.    Vital Signs  Temperature 36.3 ??C (97.3 ??F), height 167.6 cm (5' 6), weight 55.2 kg (121 lb 11.2 oz).    Physical Exam  General: Well-developed, well-nourished. Appropriate, comfortable, and in no apparent distress.  Head/Face: On external examination there is no obvious asymmetry or scars. On palpation there is no tenderness over maxillary sinuses or masses within the salivary glands. Cranial nerves V and VII are intact through all distributions.  Eyes: PERRL, EOMI, the conjunctiva are not injected and sclera is non-icteric.  Ears: On external exam, there is no obvious lesions or asymmetry. The EACs are bilaterally without cerumen or lesions. The TMs are in the neutral position and are mobile to pneumatic otoscopy bilaterally. There are no middle ear masses or fluid noted. Hearing is grossly intact bilaterally.  Nose: On external exam there are neither lesions nor asymmetry of the nasal tip/ dorsum. On anterior rhinoscopy, visualization posteriorly is limited on anterior examination. For this reason, to adequately evaluate posteriorly for masses, polypoid disease and/or signs of infections, nasal endoscopy is indicated (see procedure below).  Oral cavity/oropharynx: The mucosa of the lips, gums, hard and soft palate, posterior pharyngeal wall, tongue, floor of mouth, and buccal region are without masses or lesions and are normally hydrated. Good dentition. Tongue protrudes midline. Tonsils are normal appearing. Supraglottis not visualized due to gag reflex.  Neck: There is no asymmetry or masses. Trachea is midline. There is no enlargement of the thyroid or palpable thyroid nodules.   Lymphatics: There is no palpable lymphadenopathy along the jugulodiagastric, submental, or posterior cervical chains.  Chest: No audible wheeze, unlabored respirations.  Cardiovascular: Regular rate.  GI: Nondistended.  Neurologic: Cranial nerve???s II-XII are grossly intact. Exam is non-focal.  Extremities: No cyanosis, clubbing or edema.    Sinonasal Endoscopy  (CPT [id number]): Due to the patient???s chronic sinusitis/chronic rhinitis, sinonasal endoscopy is indicated.  After discussion of risks and benefits, and topical decongestion and anesthesia, an endoscope was used to perform nasal endoscopy. Suctions and Tobey forceps were used to remove crust and fibrin debris from the affected sinuses without complications.   A time out identifying the patient, the procedure, the location of the procedure and any concerns was performed prior to beginning the procedure.    On the left, maxillary defect is well healed with a small crust. Frontal is open and healthy appearing. 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     On the right, no polyps or purulence. All sinuses are patent.       Oretha Ellis Nasal Endoscopy Score: [employer] is used to assess the degree of inflammation of the sinonasal structures, including the middle and superior turbinates, the ethmoid sinuses, maxillary sinuses, frontal sinuses, and sphenoid sinuses.  In the presence of previous surgery, some or  all of these structures may be absent.    Left        ?? Polyps:  Absent (0)   ?? Edema:   Absent (0)   ?? Discharge:  Thin, clear (1)    ?? Scarring:  Absent (0)   ?? Crusting:  None (0)      Total Left:  1     Right         ?? Polyps:  Absent (0)   ?? Edema:  None (0)   ?? Discharge: None (0)    ?? Scarring:  Absent (0)   ?? Crusting:  None (0)      Total Right:   0      Labs and Diagnostic Tests  CT scan was reviewed demonstrates left-sided maxillary opacification edentulous on the left. Osteitic change within the maxillary sinus concerning for a tumor either benign or malignant.        Assessment:  The patient is a 64 y.o. male who  has a past medical history of Asthma, Chronic headaches, Colon polyp, Enlarged prostate, GERD (gastroesophageal reflux disease) (02/08/2013), Hepatitis C, substance abuse (CMS-HCC), Infectious viral hepatitis, Lung nodule seen on imaging study (07/12/2016), Osteoporosis, Other emphysema (CMS-HCC), Renal calculus, Skin cancer, and Tobacco abuse (02/08/2013). who presents for the evaluation of: Left-sided maxillary tumor.      Recommendations:  1.  Patient status post removal of left-sided maxillary tumor. Doing well today, without sinonasal complaints this morning. Continue to encourage regular nasal irrigations. The patient knows to use a Vaseline at night for a few weeks if he starts having nose bleeds.      2. Will plan on seeing back in 1 year for repeat exam, or sooner if needed.     The patient voiced complete understanding of plan as detailed above and is in full agreement.    Scribe's Attestation: Junius Finner, MD obtained and performed the history, physical exam and medical decision making elements that were entered into the chart. Signed by Ernie Avena, Scribe, on January 30, 2020 8:17 AM     ----------------------------------------------------------------------------------------------------------------------  January 30, 2020 3:40 PM. Documentation assistance provided by the Scribe. I was present during the time the encounter was recorded. The information recorded by the Scribe was done at my direction and has been reviewed and validated by me.  ----------------------------------------------------------------------------------------------------------------------

## 2020-02-03 NOTE — Unmapped (Signed)
[facility] Clinical Assessment & Refill Coordination Note    Harold Richards, [place]: November 22, 1955  Phone: [phone] (home)     All above HIPAA information was verified with patient.     Was a Nurse, learning disability used for this call? No    Specialty Medication(s):   Infectious Disease: Xifaxan     Current Outpatient Medications   Medication Sig Dispense Refill   ??? albuterol HFA 90 mcg/actuation inhaler Inhale 2 puffs every four (4) hours as needed for wheezing. 3 Inhaler 3   ??? aspirin (ECOTRIN) 81 MG tablet Take 81 mg by mouth daily.     ??? atorvastatin (LIPITOR) 20 MG tablet Take 1 tablet (20 mg total) by mouth nightly. 90 tablet 3   ??? cholecalciferol, vitamin D3, (VITAMIN D3) 1,000 unit capsule Take 1,000 Units by mouth daily.     ??? cyanocobalamin (VITAMIN B-12) 250 MCG tablet Take 250 mcg by mouth daily.     ??? ferrous sulfate 325 mg (65 mg iron) CpER Take 1 tablet by mouth daily.      ??? fluticasone propion-salmeterol (ADVAIR DISKUS) 250-50 mcg/dose diskus Inhale 1 puff Two (2) times a day. 3 each 3   ??? guaiFENesin (MUCINEX) 600 mg 12 hr tablet Take 600 mg by mouth every morning.     ??? lactulose (CHRONULAC) 10 gram/15 mL solution Take 15 mL (10 g total) by mouth Two (2) times a day. 240 mL 0   ??? loratadine (CLARITIN) 10 mg tablet Take 10 mg by mouth daily.     ??? multivitamin (TAB-A-VITE/THERAGRAN) per tablet Take 1 tablet by mouth daily.     ??? rifAXIMin (XIFAXAN) 550 mg Tab Take 1 tablet (550 mg total) by mouth Two (2) times a day. 60 tablet 6   ??? sertraline (ZOLOFT) 25 MG tablet      ??? SUBOXONE 8-2 mg sublingual film Place 1 Film under the tongue Two (2) times a day.   0   ??? tamsulosin (FLOMAX) 0.4 mg capsule Take 1 capsule (0.4 mg total) by mouth two (2) times a day. 180 capsule 1   ??? vit C,E-Zn-coppr-lutein-zeaxan (PRESERVISION AREDS 2) 250-200-40-1 mg-unit-mg-mg cap Take 1 tablet by mouth Two (2) times a day.       No current facility-administered medications for this visit. Changes to medications: [name] reports no changes at this time.    Allergies   Allergen Reactions   ??? Bicalutamide Rash   ??? Penicillins Rash   ??? Rocephin [Ceftriaxone] Rash     Patient received in Emergency Room and reported rash to stomach and back several hours later.        Changes to allergies: No    SPECIALTY MEDICATION ADHERENCE     Xifaxan 550 mg: 6 or 7 days of medicine on hand       Medication Adherence    Patient reported X missed doses in the last month: 0  Specialty Medication: Xifaxan 550 mg  Patient is on additional specialty medications: No  Any gaps in refill history greater than 2 weeks in the last 3 months: no  Demonstrates understanding of importance of adherence: yes  Informant: patient  Provider-estimated medication adherence level: good  Patient is at risk for Non-Adherence: No          Specialty medication(s) dose(s) confirmed: Regimen is correct and unchanged.     Are there any concerns with adherence? No    Adherence counseling provided? Not needed    CLINICAL MANAGEMENT AND  INTERVENTION      Clinical Benefit Assessment:    Do you feel the medicine is effective or helping your condition? Yes    Clinical Benefit counseling provided? Not needed    Adverse Effects Assessment:    Are you experiencing any side effects? No    Are you experiencing difficulty administering your medicine? No    Quality of Life Assessment:    How many days over the past month did your cirrhosis  keep you from your normal activities? For example, brushing your teeth or getting up in the morning. 0    Have you discussed this with your provider? Not needed    Therapy Appropriateness:    Is therapy appropriate? Yes, therapy is appropriate and should be continued    DISEASE/MEDICATION-SPECIFIC INFORMATION      N/A    PATIENT SPECIFIC NEEDS     - Does the patient have any physical, cognitive, or cultural barriers? No    - Is the patient high risk? No     - Does the patient require a Care Management Plan? No     - Does the patient require physician intervention or other additional services (i.e. nutrition, smoking cessation, social work)? No      SHIPPING     Specialty Medication(s) to be Shipped:   Infectious Disease: Xifaxan    Other medication(s) to be shipped: n/a     Changes to insurance: No    Delivery Scheduled: Yes, Expected medication delivery date: 02/06/20.     Medication will be delivered via Next Day Courier to the confirmed prescription address in [facility]    The patient will receive a drug information handout for each medication shipped and additional FDA Medication Guides as required.  Verified that patient has previously received a Conservation officer, historic buildings.    All of the patient's questions and concerns have been addressed.    Roderic Palau   [facility] Specialty Pharmacist

## 2020-02-05 MED FILL — XIFAXAN 550 MG TABLET: ORAL | 30 days supply | Qty: 60 | Fill #5

## 2020-02-05 MED FILL — XIFAXAN 550 MG TABLET: 30 days supply | Qty: 60 | Fill #5 | Status: AC

## 2020-02-25 ENCOUNTER — Ambulatory Visit: Admit: 2020-02-25 | Discharge: 2020-02-26 | Payer: PRIVATE HEALTH INSURANCE

## 2020-02-25 DIAGNOSIS — J438 Other emphysema: Principal | ICD-10-CM

## 2020-02-25 DIAGNOSIS — J449 Chronic obstructive pulmonary disease, unspecified: Principal | ICD-10-CM

## 2020-02-25 DIAGNOSIS — K7469 Other cirrhosis of liver: Principal | ICD-10-CM

## 2020-02-25 DIAGNOSIS — G4733 Obstructive sleep apnea (adult) (pediatric): Principal | ICD-10-CM

## 2020-02-25 DIAGNOSIS — F17200 Nicotine dependence, unspecified, uncomplicated: Principal | ICD-10-CM

## 2020-02-25 DIAGNOSIS — F1111 Opioid abuse, in remission: Principal | ICD-10-CM

## 2020-02-25 NOTE — Unmapped (Signed)
At your next visit, we'll talk about me taking over your suboxone.     Keep wearing your CPAP every night for at least 5 hours.

## 2020-02-25 NOTE — Unmapped (Signed)
[facility]  Established Visit    Reason for Visit:  CPAP compliance, cirrhosis care    1. OSA and COPD overlap syndrome (CMS-HCC)    2. Cirrhosis (CMS-HCC)    3. Other emphysema (CMS-HCC)    4. Tobacco use disorder    5. 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 Follows with [facility] Prior Fibroscan with F4 (cirrhosis).  HE controlled with lactulose and Xifaxin.  Continues HCC surveillance q6 months.  F/u with hepatology as scheduled    Tobacco abuse:  Has cut back since he retired and around grandkids more.  Contemplating quiting, but also under stress with wife's recent Dx of breast cancer.    OUD in remission on maintenance therapy:  Discussed transfer of Suboxone treatment to me.  Will discuss with wife and we will revisit this at next appt with me.  Also discussed possibility of decreasing dose in the future.    Return in about 6 months (around 08/27/2020). face to face    Medication adherence and barriers to the treatment plan have been addressed. Opportunities to optimize healthy behaviors have been discussed. Patient / caregiver voiced understanding.        __________________________________________________________    HPI:    Retired about 1 month ago. Spending more time with grandkids.    Sleeping longer stretches.   Deliever month and half ago from [facility].     5-6 cigarettes/day now.  Don't smoke in front of grandchildren so has led him to cut back.  Talking with wife about quitting, but also stressed out bc she was just diagnosed with breast cancer and undergoing chemo, surgery in near future and then more chemo/rads  Smokes within 30 minutes of waking up. Not sure if can ever quit smoking.    On suboxone, has been on it for years. Had gone done on dose before went from 3 to 2 times per day.  Gets from external provider    Medications:  Reviewed in EPIC      Physical Exam:   Vital Signs:   BP 126/63 (BP Site: L Arm)  - Pulse 58  - Temp 36.9 ??C (98.5 ??F)  - Resp 18  - Wt 55.2 kg (121 lb 12.8 oz)  - SpO2 98%  - BMI 19.66 kg/m??   Gen: Well appearing, Thin NAD  CV: RRR, no murmurs  Pulm: CTA bilaterally, no crackles or wheezes  GI: Soft, NTND, normal BS. No ascites  MSK: No edema

## 2020-02-27 ENCOUNTER — Encounter
Admit: 2020-02-27 | Discharge: 2020-03-07 | Payer: PRIVATE HEALTH INSURANCE | Attending: Radiation Oncology | Primary: Radiation Oncology

## 2020-02-27 DIAGNOSIS — Z923 Personal history of irradiation: Principal | ICD-10-CM

## 2020-02-27 DIAGNOSIS — C61 Malignant neoplasm of prostate: Principal | ICD-10-CM

## 2020-02-27 LAB — PROSTATE SPECIFIC ANTIGEN: Prostate specific Ag:MCnc:Pt:Ser/Plas:Qn:: <0.04

## 2020-02-27 NOTE — Unmapped (Signed)
RADIATION ONCOLOGY FOLLOW-UP VISIT NOTE     Encounter Date: 02/27/2020  Patient Name: Harold Richards  Medical Record Number: [id number]    DIAGNOSIS:  64 y.o.??male??with high??risk prostate cancer, cT1c, Gleason??4+4=8??in 2/12??cores, PSA 4.59 s/p external beam RT (45Gy) with brachytherapy boost (brachy done 11/05/2018)    DURATION SINCE COMPLETION OF RADIOTHERAPY:  1 year, 4 months (brachy on 11/05/2018)    Lupron #1 (3 month injection) 07/17/2018  Lupron #2 (3 month injection) 10/16/2018  Lupron #3 (3 month injection) 01/16/2019  Lupron #4 (1 month injection) 04/18/2019  Eligard #5 (3 month injection) 05/29/2019  Eligard #6 (3 month injection) 08/28/2019  Eligard #7 (3 month injection) 11/27/2019    ASSESSMENT:  Disease Status: No Evidence of Disease (NED).  PSA today <0.04.    RECOMMENDATIONS:  1. PSA today is <0.04.   We discussed that PSA can rise as testosterone recovers after he finishes ADT  2. ADT:  Completed- last injection 11/27/2019- discussed that this shot lasts ~3 months so testosterone should begin recovering now but generally takes several months  3. FOLLOW-UP:  He will return in 3 months for routine follow-up and PSA check  4. GU:  Stable- Continue flomax BID  5. GI:  No issues- colonoscopy in 2017 with recommendation for next scope in 2022    INTERVAL HISTORY:      He is doing well today from a prostate cancer standpoint.  His urinary symptoms remain stable- nocturia 2-3x, mild daytime frequency, mild urgency.  No dysuria, no incontinence.  Takes flomax BID and has worsening of urinary symptoms if he misses a dose.  No new bowel issues- takes lactulose so has loose stool at baseline but no bleeding or pain with bowel movements.  Hot flashes still present but much better over the past few weeks.  His wife is going through breast cancer treatment and struggling with neoadjuvant chemo- she has frequent visits due to dehydration and other issues.  He did get to retire early (had planned for this fall, but due to vacation/sick leave got to retire in June).    Baseline  General:??Very fatigued. No ED  Urinary:??On flomax, post-void residual 0 cc.??Has frequency, nocturia 2-3x/night, no urgency, hematuria, dysuria.??  GI: Denies loose stools, rectal pain, or bleeding.   Colonoscopy:??Done 10/2015, found polyp. ??Repeat in 5 years.    REVIEW OF SYSTEMS:  A comprehensive review of 10 systems was negative except for pertinent positives noted in HPI.    PAST MEDICAL HISTORY/FAMILY HISTORY/SOCIAL HISTORY:  Reviewed in EPIC    ALLERGIES/MEDICATIONS:  Reviewed in EPIC    PHYSICAL EXAM:  Vital Signs for this encounter:   BP 130/64  - Pulse 63  - Temp 36.8 ??C (98.2 ??F) (Temporal)  - Wt 55.4 kg (122 lb 3.2 oz)  - SpO2 96%  - BMI 19.72 kg/m??   Karnofsky/Lansky Performance Status: 90,  Able to carry on normal activity; minor signs or symptoms of disease (ECOG equivalent 0)  General:   No acute distress, alert and oriented X 4   Head: Normocephalic, without obvious abnormality, atraumatic  Eyes: EOMI, no scleral icterus  Lungs: Normal work of breathing  Heart: regular rate and rhythm, S1, S2 normal, no murmur, click, rub or gallop  Abdomen: non-distended  Extremities: extremities normal, atraumatic, no cyanosis or edema  Lymph nodes: No palpable supraclavicular or cervical lymphadenopathy  Neurologic: Grossly normal   Rectal: Deferred      RADIOLOGY:  No new imaging to review    Labs:  PSA   Date Value Ref Range Status   02/27/2020 <0.04 0.00 - 4.00 ng/mL Final   08/28/2019 <0.10 0.00 - 4.00 ng/mL Final   01/16/2019 <0.10 0.00 - 4.00 ng/mL Final   05/18/2018 4.59 (H) 0.00 - 4.00 ng/mL Final   10/26/2017 3.88 0.00 - 4.00 ng/mL Final         Rayetta Humphrey, MD  Assistant Professor  [facility] Dept of Radiation Oncology  02/27/2020

## 2020-02-27 NOTE — Unmapped (Signed)
[facility] Refill Coordination Note    Specialty Medication(s) to be Shipped:   Infectious Disease: Xifaxan    Other medication(s) to be shipped:       Harold Richards, DOB: 1956-03-19  Phone: [phone] (home)       All above HIPAA information was verified with patient's family member, wife.     Was a Nurse, learning disability used for this call? No    Completed refill call assessment today to schedule patient's medication shipment from the [facility] [phone]).       Specialty medication(s) and dose(s) confirmed: Regimen is correct and unchanged.   Changes to medications: Harold Richards reports no changes at this time.  Changes to insurance: No  Questions for the pharmacist: No    Confirmed patient received Welcome Packet with first shipment. The patient will receive a drug information handout for each medication shipped and additional FDA Medication Guides as required.       DISEASE/MEDICATION-SPECIFIC INFORMATION        N/A    SPECIALTY MEDICATION ADHERENCE     Medication Adherence    Patient reported X missed doses in the last month: 0  Specialty Medication: xifaxan 550mg   Patient is on additional specialty medications: No  Informant: spouse  Reliability of informant: reliable  Patient is at risk for Non-Adherence: No                xifaxan 550 mg: 9 days of medicine on hand         SHIPPING     Shipping address confirmed in Epic.     Delivery Scheduled: Yes, Expected medication delivery date: 07/27.     Medication will be delivered via Next Day Courier to the prescription address in Epic WAM.    Harold Richards   [facility] Specialty Technician

## 2020-03-02 NOTE — Unmapped (Signed)
Harold Richards 's Xifaxan shipment will be delayed as a result of the medication is too soon to refill until 03/05/20.     I have reached out to the patient and communicated the delay. We will reschedule the medication for the delivery date that the patient agreed upon.  We have confirmed the delivery date as 03/06/20, via next day courier.

## 2020-03-05 MED FILL — XIFAXAN 550 MG TABLET: 30 days supply | Qty: 60 | Fill #6 | Status: AC

## 2020-03-05 MED FILL — XIFAXAN 550 MG TABLET: ORAL | 30 days supply | Qty: 60 | Fill #6

## 2020-03-09 ENCOUNTER — Encounter: Admit: 2020-03-09 | Discharge: 2020-03-09 | Payer: PRIVATE HEALTH INSURANCE

## 2020-03-09 ENCOUNTER — Ambulatory Visit: Admit: 2020-03-09 | Discharge: 2020-03-09 | Payer: PRIVATE HEALTH INSURANCE

## 2020-03-09 MED ADMIN — gadobenate dimeglumine (MULTIHANCE) 529 mg/mL (0.1mmol/0.2mL) solution 6 mL: 6 mL | INTRAVENOUS | @ 13:00:00 | Stop: 2020-03-09

## 2020-03-10 DIAGNOSIS — K7460 Unspecified cirrhosis of liver: Principal | ICD-10-CM

## 2020-03-10 DIAGNOSIS — Z720 Tobacco use: Principal | ICD-10-CM

## 2020-03-10 DIAGNOSIS — K769 Liver disease, unspecified: Principal | ICD-10-CM

## 2020-03-10 DIAGNOSIS — R9389 Abnormal findings on diagnostic imaging of other specified body structures: Principal | ICD-10-CM

## 2020-03-10 NOTE — Unmapped (Signed)
Spoke with patient and reviewed CT scan of chest and MRI of abdomen findings. Recommendation to repeat MRI of abdomen and CT scan of chest in six months. Order placed. Patient in agreement. Will ask scheduling to reach out to him too about follow up visit with myself next month.

## 2020-03-23 NOTE — Unmapped (Signed)
DERMATOLOGY CLINIC NOTE    ASSESSMENT AND PLAN:     Skin neoplasm, shave biopsies:  - A: Right preauricular face  - B: Left cheek  -Rule out basal cell carcinoma  -Biopsy of lesion(s) in question  was performed in typical fashion using 2% lidocaine with epinephrine and aluminum chloride for hemostasis.      Return to clinic:  4 months for complete skin check.      CHIEF COMPLAINT:  Lesions of concern     HPI:   This is a pleasant 64 y.o. male who I am asked to see in consultation by Talbert Forest, MD for skin lesion on the face.  The patient last saw Dr. Hulan Fess on 02/25/2020 for COPD, cirrhosis, emphysema, tobacco use disorder, opioid use disorder.     PAST MEDICAL HISTORY:  No known history of skin cancer    Hepatitis C  Cirrhosis  Emphysema  Osteoporosis  History of prostate cancer  Tobacco use disorder  Opioid use disorder     MEDICATIONS:   Current Outpatient Medications on File Prior to Visit   Medication Sig Dispense Refill   ??? albuterol HFA 90 mcg/actuation inhaler Inhale 2 puffs every four (4) hours as needed for wheezing. 3 Inhaler 3   ??? aspirin (ECOTRIN) 81 MG tablet Take 81 mg by mouth daily.     ??? atorvastatin (LIPITOR) 20 MG tablet Take 1 tablet (20 mg total) by mouth nightly. 90 tablet 3   ??? cholecalciferol, vitamin D3, (VITAMIN D3) 1,000 unit capsule Take 1,000 Units by mouth daily.     ??? cyanocobalamin (VITAMIN B-12) 250 MCG tablet Take 250 mcg by mouth daily.     ??? ferrous sulfate 325 mg (65 mg iron) CpER Take 1 tablet by mouth daily.      ??? fluticasone propion-salmeterol (ADVAIR DISKUS) 250-50 mcg/dose diskus Inhale 1 puff Two (2) times a day. 3 each 3   ??? guaiFENesin (MUCINEX) 600 mg 12 hr tablet Take 600 mg by mouth every morning.     ??? lactulose (CHRONULAC) 10 gram/15 mL solution Take 15 mL (10 g total) by mouth Two (2) times a day. 240 mL 0   ??? loratadine (CLARITIN) 10 mg tablet Take 10 mg by mouth daily.     ??? multivitamin (TAB-A-VITE/THERAGRAN) per tablet Take 1 tablet by mouth daily. ??? rifAXIMin (XIFAXAN) 550 mg Tab Take 1 tablet (550 mg total) by mouth Two (2) times a day. 60 tablet 6   ??? sertraline (ZOLOFT) 25 MG tablet  (Patient not taking: Reported on 02/27/2020)     ??? SUBOXONE 8-2 mg sublingual film Place 1 Film under the tongue Two (2) times a day.   0   ??? tamsulosin (FLOMAX) 0.4 mg capsule Take 1 capsule (0.4 mg total) by mouth two (2) times a day. 180 capsule 1   ??? vit C,E-Zn-coppr-lutein-zeaxan (PRESERVISION AREDS 2) 250-200-40-1 mg-unit-mg-mg cap Take 1 tablet by mouth Two (2) times a day.       No current facility-administered medications on file prior to visit.       ALLERGIES:   Reviewed in epic    SOCIAL HISTORY:  Accompanied by wife who has breast cancer     REVIEW OF SYSTEMS:  Baseline state of health. No recent illnesses. No other skin complaints.     PHYSICAL EXAMINATION:  Examination in the presence of chaperone:  General: Well-developed, well-nourished. No acute distress.   Neuro: Alert and oriented, answers questions appropriately.  Skin: Examination of the scalp,  face, head, neck was performed and notable for the following:  Atrophic pigmented papule on right cheek preauricular  Pearly twinge ectatic papule on left cheek             Dictation software was used while making this note. Please excuse any errors made with dictation software.

## 2020-03-24 ENCOUNTER — Encounter: Admit: 2020-03-24 | Discharge: 2020-03-25 | Payer: PRIVATE HEALTH INSURANCE

## 2020-03-24 DIAGNOSIS — L989 Disorder of the skin and subcutaneous tissue, unspecified: Principal | ICD-10-CM

## 2020-03-24 DIAGNOSIS — D492 Neoplasm of unspecified behavior of bone, soft tissue, and skin: Principal | ICD-10-CM

## 2020-03-24 DIAGNOSIS — D489 Neoplasm of uncertain behavior, unspecified: Principal | ICD-10-CM

## 2020-03-24 NOTE — Unmapped (Signed)
Shave biopsy   A shave biopsy involves numbing a small area of your skin and then obtaining a sample to help us with proper diagnosis or skin condition. Biopsy results typically return in 7 to 14 days.    To care for the area: Leave the bandage in place until the morning after your procedure is performed. On a daily basis, carefully remove the bandage, then shower or wash as usual. Allow water to run over the site. Please do not scrub. Carefully dry the area, then apply ointment (some people develop an allergy to Neosporin, so we recommend Vaseline orAquaphor). Cover the site with a fresh bandage. Should any bleeding occur, apply firm pressure for 15 minutes. The treated site will heal best if  a scab never forms (the wound heals by new skin cells traveling from the outside toward the middle-their journey is easier if no scab stands in their way).    Long-term care: the site will be more sensitive than your surrounding skin. Keep it covered, and remember to apply sunscreen every day to all your exposed skin. A scar may remain which is lighter or pinker than your normal skin. Your body will continue to improve your scar for up to one year.    Infection following this procedure is rare. However, if you are worried about the appearance of your site, contact your doctor. Complete healing may take up to one month. We have a physician on call at all times. If you have any concerns about the site, please call our clinic at [phone]

## 2020-03-25 DIAGNOSIS — C44319 Basal cell carcinoma of skin of other parts of face: Principal | ICD-10-CM

## 2020-03-25 NOTE — Unmapped (Signed)
I called patient to discuss biopsy results.  Discussed basal cell carcinomas and referred to Mohs.  He is in agreement with this.Robin-tracking, yes, Mohs

## 2020-03-27 DIAGNOSIS — G9340 Encephalopathy, unspecified: Principal | ICD-10-CM

## 2020-03-27 MED ORDER — XIFAXAN 550 MG TABLET
ORAL_TABLET | Freq: Two times a day (BID) | ORAL | 6 refills | 30.00000 days
Start: 2020-03-27 — End: ?

## 2020-03-27 NOTE — Unmapped (Signed)
[facility] Refill Coordination Note    Specialty Medication(s) to be Shipped:   Infectious Disease: Xifaxan    Other medication(s) to be shipped: No additional medications requested for fill at this time     Harold Richards, DOB: 06/29/1956  Phone: [phone] (home)       All above HIPAA information was verified with patient.     Was a Nurse, learning disability used for this call? No    Completed refill call assessment today to schedule patient's medication shipment from the [facility] [phone]).       Specialty medication(s) and dose(s) confirmed: Regimen is correct and unchanged.   Changes to medications: Harold Richards reports no changes at this time.  Changes to insurance: No  Questions for the pharmacist: No    Confirmed patient received Welcome Packet with first shipment. The patient will receive a drug information handout for each medication shipped and additional FDA Medication Guides as required.       DISEASE/MEDICATION-SPECIFIC INFORMATION        N/A    SPECIALTY MEDICATION ADHERENCE     Medication Adherence    Patient reported X missed doses in the last month: 0  Specialty Medication: xifaxan 550mg             Unable to confirm quantity on hand       SHIPPING     Shipping address confirmed in Epic.     Delivery Scheduled: Yes, Expected medication delivery date: 8/27.  However, Rx request for refills was sent to the provider as there are none remaining.     Medication will be delivered via Next Day Courier to the prescription address in Epic WAM.    [name]   [facility] Specialty Technician

## 2020-03-30 MED ORDER — RIFAXIMIN 550 MG TABLET
ORAL_TABLET | Freq: Two times a day (BID) | ORAL | 6 refills | 30.00000 days | Status: CP
Start: 2020-03-30 — End: ?
  Filled 2020-04-06: qty 60, 30d supply, fill #0

## 2020-04-02 NOTE — Unmapped (Signed)
Harold Richards 's Xifaxan shipment will be delayed as a result of the medication is too soon to refill until 8/28.     I have reached out to the patient and communicated the delivery change. We will reschedule the medication for the delivery date that the patient agreed upon.  We have confirmed the delivery date as 8/30, via same day courier.

## 2020-04-06 MED FILL — XIFAXAN 550 MG TABLET: 30 days supply | Qty: 60 | Fill #0 | Status: AC

## 2020-04-10 NOTE — Unmapped (Signed)
05/25/20 Mohs Sallee Lange

## 2020-04-11 MED ORDER — ATORVASTATIN 20 MG TABLET
ORAL_TABLET | 3 refills | 0 days
Start: 2020-04-11 — End: ?

## 2020-04-14 MED ORDER — ATORVASTATIN 20 MG TABLET
ORAL_TABLET | Freq: Every evening | ORAL | 3 refills | 0.00000 days | Status: CP
Start: 2020-04-14 — End: ?

## 2020-04-14 NOTE — Unmapped (Signed)
Refilled atorvastatin

## 2020-04-15 ENCOUNTER — Ambulatory Visit: Admit: 2020-04-15 | Discharge: 2020-04-16 | Payer: PRIVATE HEALTH INSURANCE | Attending: Family | Primary: Family

## 2020-04-15 DIAGNOSIS — K7460 Unspecified cirrhosis of liver: Principal | ICD-10-CM

## 2020-04-15 DIAGNOSIS — R413 Other amnesia: Principal | ICD-10-CM

## 2020-04-15 DIAGNOSIS — Z1321 Encounter for screening for nutritional disorder: Principal | ICD-10-CM

## 2020-04-15 LAB — CBC W/ AUTO DIFF
BASOPHILS ABSOLUTE COUNT: 0 10*9/L (ref 0.0–0.1)
BASOPHILS RELATIVE PERCENT: 0.8 %
EOSINOPHILS RELATIVE PERCENT: 1.4 %
HEMATOCRIT: 39.8 % (ref 38.0–50.0)
HEMOGLOBIN: 13.6 g/dL (ref 13.5–17.5)
LYMPHOCYTES ABSOLUTE COUNT: 1.4 10*9/L (ref 0.7–4.0)
LYMPHOCYTES RELATIVE PERCENT: 23.3 %
MEAN CORPUSCULAR HEMOGLOBIN CONC: 34.1 g/dL (ref 30.0–36.0)
MEAN CORPUSCULAR HEMOGLOBIN: 35.6 pg — ABNORMAL HIGH (ref 26.0–34.0)
MEAN PLATELET VOLUME: 7.1 fL (ref 7.0–10.0)
MONOCYTES ABSOLUTE COUNT: 0.5 10*9/L (ref 0.1–1.0)
MONOCYTES RELATIVE PERCENT: 8.1 %
NEUTROPHILS ABSOLUTE COUNT: 3.9 10*9/L (ref 1.7–7.7)
PLATELET COUNT: 190 10*9/L (ref 150–450)
RED BLOOD CELL COUNT: 3.82 10*12/L — ABNORMAL LOW (ref 4.32–5.72)
RED CELL DISTRIBUTION WIDTH: 12.5 % (ref 12.0–15.0)
WBC ADJUSTED: 5.9 10*9/L (ref 3.5–10.5)

## 2020-04-15 LAB — BASIC METABOLIC PANEL
ANION GAP: 6 mmol/L (ref 5–14)
BLOOD UREA NITROGEN: 19 mg/dL (ref 9–23)
BUN / CREAT RATIO: 17
CALCIUM: 10.1 mg/dL (ref 8.7–10.4)
CHLORIDE: 110 mmol/L — ABNORMAL HIGH (ref 98–107)
CO2: 24.7 mmol/L (ref 20.0–31.0)
CREATININE: 1.13 mg/dL
EGFR CKD-EPI AA MALE: 81 mL/min/{1.73_m2} (ref >=60)
EGFR CKD-EPI NON-AA MALE: 70 mL/min/{1.73_m2} (ref >=60)
GLUCOSE RANDOM: 93 mg/dL (ref 70–179)
SODIUM: 141 mmol/L (ref 135–145)

## 2020-04-15 LAB — PROTIME-INR: INR: 0.99

## 2020-04-15 LAB — INR: Coagulation tissue factor induced.INR:RelTime:Pt:PPP:Qn:Coag: 0.99

## 2020-04-15 LAB — AFP-TUMOR MARKER: Alpha-1-Fetoprotein.tumor marker:MCnc:Pt:Ser/Plas:Qn:: 3

## 2020-04-15 LAB — GLUCOSE RANDOM: Glucose:MCnc:Pt:Ser/Plas:Qn:: 93

## 2020-04-15 LAB — HEPATIC FUNCTION PANEL
ALKALINE PHOSPHATASE: 42 U/L — ABNORMAL LOW (ref 46–116)
ALT (SGPT): 23 U/L (ref 10–49)
AST (SGOT): 33 U/L (ref <=34)
PROTEIN TOTAL: 7.2 g/dL (ref 5.7–8.2)

## 2020-04-15 LAB — ALKALINE PHOSPHATASE: Alkaline phosphatase:CCnc:Pt:Ser/Plas:Qn:: 42 — ABNORMAL LOW

## 2020-04-15 LAB — HEMATOCRIT: Hematocrit:VFr:Pt:Bld:Qn:: 39.8

## 2020-04-15 NOTE — Unmapped (Signed)
1.  Laboratory studies ordered today. Will be in touch with results.   2.  Remain scheduled for MRI of abdomen and CT scan of chest in February.   3.  Office follow up February after testing to discuss results.   4.  Continue current medications, but do recommend starting sertraline 25 mg once daily as prescribed.   5.  Referral order placed for neurology. Evaluation of memory recall.   6.  Any questions please let me know.   7.  Continue with healthy diet and weight management.

## 2020-04-15 NOTE — Unmapped (Signed)
[facility] Ph [phone] Fax [phone]  Woodfin Ganja, M.D.  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 HCV treatment  ~ 2016    Presentation of Current Illness:    [name] is a 64 y.o. pleasant Caucasian gentleman who presents today for cirrhosis care.  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 Cured of HCV dating back to 2016 (Harvoni x 12 weeks). He was treatment naive with genotype 1a. History of recreational drug use (cocaine IV, intranasal and crack), three tattoos with one being homemade, and incarcerated once. Fibroscan 20 kPa consistent with F4 (cirrhosis). PMH also significant for prostate cancer, OSA with overlap COPD, basal cell carcinoma, chronic fungal infection of maxillary sinus, pulmonary nodules, nicotine dependency, chronic headaches, and renal calculus.     Interim History:  Patient presents alone today. Usually his wife, Harold Richards is present. She is currently undergoing treatment for breast cancer. Scheduled for lumpectomy next Monday. Patient remains on lactulose 15 ml bid and xifaxan 550 mg bid for medical management of HE. He started to experience neurological changes @ one and 1/2 years ago. Unfortunately with taking lactulose and Xifaxan, both patient and his wife do not feel his mental status has completely improved. He continues to experience memory recall which has progressively become worse over the past few months. Unable to recall family names. He retired this summer, which he hoped would improve his mental state and health overall would improvement without work stressors being present. Long standing history of tobacco use. He has been trying to cut down, but difficult with his wife's current medical history.     He had a colonoscopy 10/20/15 showing 1 small adenoma- 5 yr follow-up. EGD 08/2017 essentially unremarkable. Denies any jaundice, pruritis, SOB, CP, increased abdominal girth, LE edema, melena, BRBPR, confusion, vomiting, or diarrhea. Adherence with CPAP use. Noted four lb weight gain. Continued complaint of fatigue.     Finding of two 3 mm pulmonary nodules on CT scan lung for lung cancer screening (07/11/2016) in the setting of history of tobacco abuse. CT scan of chest performed 07/21/2017 stable unchanged subcentimeter pulmonary nodules.CT scan of chest 03/09/2020: Right upper lobe nodular pulmonary parenchymal opacity, unchanged. Recommend 6 month follow-up chest CT to document continued stability.    Echocardiogram 11/17/2017: Performed for indication of bradycardia.     Interpretation Summary     ?? Normal left ventricular systolic function, ejection fraction 60 to 65%  ?? Dilated left atrium - mild  ?? Normal right ventricular systolic function  ?? Tricuspid regurgitation - mild  ?? Mildly elevated right atrial pressure     External ECG-48 to 21 day findings:  Conclusion: ??1 run of non-sustained Ventricular Tachycardia occurred lasting 6 beats with a max rate of 112 bpm (avg 93 bpm). ??37 Supraventricular Tachycardia runs occurred, most consistent with paroxysmal atrial tachycardia; the run with the fastest interval lasting 13 beats with a max rate of 207 bpm, the longest lasting 8 beats with an avg rate of 104 bpm.    NM Myocardial perfusion spect multiple: 11/2018:   Normal myocardial perfusion study. No evidence for significant ischemia or scar is noted. Post stress: Global systolic function is hyperdynamic. The ejection fraction was greater than 65%. Coronary calcifications are noted. Mild bibasilar dependent atelectasis  Cirrhosis Care:  -- Varices screen: normal platelets and spleen size with Fibroscan < 25 kPa. No history of EGD.  -- HCC screen: MRI of abdomen 03/09/2020:  Unchanged hyperenhancing hepatic segment IV lesion. LR-2.  ??  0.9 cm peripherally enhancing hepatic segment III lesion. LR-2.  ??  Multiple new areas of subcentimeter hyperenhancement within the right hepatic lobe which fade to isointensity on delayed imaging, may represent transient perfusion abnormalities. LR-3.  Spleen unremarkable. No ascites.   -- Immunity hepatitis B.   -- Immunity hepatitis A: Series completed.   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  -- OLT status: Not indicated at this time based on low MELD score  -- MELD score: 7, Child-Pugh: 5 , Class: A   MELD-Na score: 7 at 04/15/2020  8:40 AM  MELD score: 7 at 04/15/2020  8:40 AM  Calculated from:  Serum Creatinine: 1.13 mg/dL at 08/13/1094  0:45 AM  Serum Sodium: 141 mmol/L (Using max of 137 mmol/L) at 04/15/2020  8:40 AM  Total Bilirubin: 0.2 mg/dL (Using min of 1 mg/dL) at 4/0/9811  9:14 AM  INR(ratio): 0.99 (Using min of 1) at 04/15/2020  8:40 AM  Age: 55 years     Stable score of 7 on 02/2019     -- Fibroscan 06/16/2014: 20.0 kPa/F4  --Chronic hepatitis C, genotype 1a  Pre-treatment HCV RNA:  14474 IU/mL  HCV RNA 08/23/2014 - TW #6/12 - non-detectable  HCV RNA 09/24/2014 - EOT- non-detectable  HCV RNA SVR 01/01/2015 - -not detected  HCV RNA 01/26/2017 - not detected    ROS:  All systems reviewed and negative except in HPI.     Allergies:   Allergies   Allergen Reactions   ??? Bicalutamide Rash   ??? Penicillins Rash   ??? Rocephin [Ceftriaxone] Rash     Patient received in Emergency Room and reported rash to stomach and back several hours later.      Medications:  Current Outpatient Medications   Medication Sig Dispense Refill   ??? albuterol HFA 90 mcg/actuation inhaler Inhale 2 puffs every four (4) hours as needed for wheezing. 3 Inhaler 3   ??? aspirin (ECOTRIN) 81 MG tablet Take 81 mg by mouth daily.     ??? atorvastatin (LIPITOR) 20 MG tablet Take 1 tablet (20 mg total) by mouth nightly. 90 tablet 3   ??? cholecalciferol, vitamin D3, (VITAMIN D3) 1,000 unit capsule Take 1,000 Units by mouth daily.     ??? cyanocobalamin (VITAMIN B-12) 250 MCG tablet Take 250 mcg by mouth daily.     ??? ferrous sulfate 325 mg (65 mg iron) CpER Take 1 tablet by mouth daily.      ??? fluticasone propion-salmeterol (ADVAIR DISKUS) 250-50 mcg/dose diskus Inhale 1 puff Two (2) times a day. 3 each 3   ??? guaiFENesin (MUCINEX) 600 mg 12 hr tablet Take 600 mg by mouth every morning.     ??? lactulose (CHRONULAC) 10 gram/15 mL solution Take 15 mL (10 g total) by mouth Two (2) times a day. 240 mL 0   ??? loratadine (CLARITIN) 10 mg tablet Take 10 mg by mouth daily.     ??? multivitamin (TAB-A-VITE/THERAGRAN) per tablet Take 1 tablet by mouth daily.     ??? rifAXIMin (XIFAXAN) 550 mg Tab Take 1 tablet (550 mg total) by mouth Two (2) times a day. 60 tablet 6   ??? SUBOXONE 8-2 mg sublingual film Place 1 Film under the tongue Two (2) times a day.   0   ???  tamsulosin (FLOMAX) 0.4 mg capsule Take 1 capsule (0.4 mg total) by mouth two (2) times a day. 180 capsule 1   ??? vit C,E-Zn-coppr-lutein-zeaxan (PRESERVISION AREDS 2) 250-200-40-1 mg-unit-mg-mg cap Take 1 tablet by mouth Two (2) times a day.     ??? sertraline (ZOLOFT) 25 MG tablet  (Patient not taking: Reported on 04/15/2020)       No current facility-administered medications for this visit.     Active Ambulatory Problems     Diagnosis Date Noted   ??? Chronic headaches    ??? Renal calculus    ??? Tobacco use disorder 02/08/2013   ??? Nasal septum perforation 02/12/2013   ??? Insomnia 02/13/2013   ??? Other emphysema (CMS-HCC)    ??? Hepatitis C 03/05/2014   ??? 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 Proteinuria 09/28/2017   ??? Prostate cancer (CMS-HCC) 09/11/2018   ??? Cirrhosis (CMS-HCC) 03/14/2019   ??? Community acquired pneumonia of right upper lobe of lung 04/07/2019   ??? Sinusitis 06/24/2019   ??? 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 Opioid use disorder, mild, in sustained remission, on maintenance therapy (CMS-HCC) 02/25/2020     Resolved Ambulatory Problems     Diagnosis Date Noted   ??? Chest pain 02/07/2013   ??? GERD (gastroesophageal reflux disease) 02/08/2013   ??? Skin lesion 02/12/2013   ??? Sialolithiasis of submandibular gland 04/08/2015   ??? Rib pain on left side 06/16/2016   ??? Health care maintenance 06/16/2016   ??? Nonspecific L Lung micronodules, repeat LDCT due 07/12/2017 07/12/2016   ??? Atherosclerosis of coronary artery 08/16/2016   ??? Actinic keratosis 02/01/2017   ??? Elevated PSA 09/28/2017   ??? Benign localized hyperplasia of prostate with urinary obstruction 09/28/2017   ??? Abnormal prostate exam 09/28/2017   ??? Hepatic encephalopathy (CMS-HCC) 03/14/2019     Past Medical History:   Diagnosis Date   ??? Asthma    ??? Colon polyp    ??? Enlarged prostate    ??? Hx of substance abuse (CMS-HCC)    ??? Infectious viral hepatitis    ??? Osteoporosis    ??? Skin cancer    ??? Tobacco abuse 02/08/2013     Past Surgical History:   Procedure Laterality Date   ??? CERVICAL FUSION      cage   ??? PR COLSC FLX W/RMVL OF TUMOR POLYP LESION SNARE TQ N/A 10/20/2015    Procedure: COLONOSCOPY FLEX; W/REMOV TUMOR/LES BY SNARE;  Surgeon: Annie Paras, MD;  [facility];  Service: Gastroenterology   ??? PR EXCISION SUBMAXILLARY GLAND Right 03/31/2015    Procedure: EXC SUBMANDIBULAR GLAND;  Surgeon: Lauralee Evener, MD;  Location: ASC OR [facility];  Service: ENT   ??? PR REMV UPPER JAW-MAXILLECTOMY Left 07/22/2019    Procedure: MAXILLECTOMY; WO ORBITAL EXENTERATION;  Surgeon: Adam Swaziland Kimple, MD;  Location: ASC OR [facility];  Service: ENT   ??? PR STEREOTACTIC COMP ASSIST PROC,CRANIAL,EXTRADURAL Left 07/22/2019    Procedure: STEREOTACTIC COMPUTER-ASSISTED (NAVIGATIONAL) PROCEDURE; CRANIAL, EXTRADURAL;  Surgeon: Adam Swaziland Kimple, MD;  Location: ASC OR [facility];  Service: ENT   ??? PR UPPER GI ENDOSCOPY,DIAGNOSIS N/A 08/18/2017    Procedure: UGI ENDO, INCLUDE ESOPHAGUS, STOMACH, & DUODENUM &/OR JEJUNUM; DX W/WO COLLECTION SPECIMN, BY BRUSH OR WASH;  Surgeon: Rona Ravens, MD;  Location: [facility];  Service: Gastroenterology   ??? PR UPPER GI ENDOSCOPY,DIAGNOSIS N/A 11/08/2019    Procedure: UGI ENDO, INCLUDE ESOPHAGUS, STOMACH, & DUODENUM &/OR JEJUNUM; DX  W/WO COLLECTION SPECIMN, BY BRUSH OR WASH;  Surgeon: Janyth Pupa, MD;  Location: [facility];  Service: Gastroenterology     Family History   Problem Relation Age of Onset   ??? Heart attack Father    ??? Hypertension Father    ??? Lung cancer Mother    ??? Bone cancer Mother    ??? Hypertension Mother    ??? Hepatitis Sister       Social History     Socioeconomic History   ??? Marital status: Married     Spouse name: Not on file   ??? Number of children: Not on file   ??? Years of education: Not on file   ??? Highest education level: Not on file   Occupational History   ??? Occupation: retired   Tobacco Use   ??? Smoking status: Light Tobacco Smoker     Packs/day: 0.25     Years: 50.00     Pack years: 12.50     Types: Cigarettes     Start date: 06/16/1969   ??? Smokeless tobacco: Former Neurosurgeon     Types: Chew     Quit date: 2009   ??? Tobacco comment: PATIENT CURRENTLY USING PATCH TO HELP WITH QUITTING   Substance and Sexual Activity   ??? Alcohol use: No     Alcohol/week: 0.0 standard drinks   ??? Drug use: Not Currently     Types: Cocaine, IV     Comment: History of cocaine use years ago (intranasal, IV and crack), marijuana use.    ??? Sexual activity: Yes     Partners: Female   Other Topics Concern   ??? Do you use sunscreen? Yes   ??? Tanning bed use? No   ??? Are you easily burned? No   ??? Excessive sun exposure? Yes   ??? Blistering sunburns? Yes   Social History Narrative   ??? Not on file     Social Determinants of Health     Financial Resource Strain:    ??? Difficulty of Paying Living Expenses:    Food Insecurity:    ??? Worried About Programme researcher, broadcasting/film/video in the Last Year:    ??? Barista in the Last Year:    Transportation Needs:    ??? Freight forwarder (Medical):    ??? Lack of Transportation (Non-Medical):    Physical Activity:    ??? Days of Exercise per Week:    ??? Minutes of Exercise per Session:    Stress:    ??? Feeling of Stress :    Social Connections: ??? Frequency of Communication with Friends and Family:    ??? Frequency of Social Gatherings with Friends and Family:    ??? Attends Religious Services:    ??? Database administrator or Organizations:    ??? Attends Engineer, structural:    ??? Marital Status:      Physical Examination:   BP 108/60  - Pulse 57  - Temp 36.3 ??C (97.3 ??F) (Temporal)  - Resp 16  - Wt 55.7 kg (122 lb 12.8 oz)  - SpO2 98%  - BMI 19.82 kg/m??   General appearance - Alert, no distress, oriented to person, place, and time. + temporal wasting. Slender.   Mental status - Normal mood, behavior, speech, dress, motor activity, and thought processes.   Eyes - pupils equal and reactive, sclera anicteric  Neck - Supple, no JVD  Chest - clear to auscultation, no  wheezes, rales or rhonchi, symmetric air entry  Heart - normal rate, regular rhythm, normal S1, S2, no murmurs, rubs, clicks or gallops  Abdomen - soft, nontender, nondistended, no masses or organomegaly  Neurological - screening mental status exam normal. No evidence of asterixis  Skin - normal coloration. No evidence of palmar erythema.      Laboratory Studies:  Results for orders placed or performed in visit on 04/15/20   Hepatic Function Panel   Result Value Ref Range    Albumin 4.3 3.4 - 5.0 g/dL    Total Protein 7.2 5.7 - 8.2 g/dL    Total Bilirubin 0.2 (L) 0.3 - 1.2 mg/dL    Bilirubin, Direct <1.61 0.00 - 0.30 mg/dL    AST 33 <=09 U/L    ALT 23 10 - 49 U/L    Alkaline Phosphatase 42 (L) 46 - 116 U/L   Basic metabolic panel   Result Value Ref Range    Sodium 141 135 - 145 mmol/L    Potassium 4.5 3.4 - 4.5 mmol/L    Chloride 110 (H) 98 - 107 mmol/L    CO2 24.7 20.0 - 31.0 mmol/L    Anion Gap 6 5 - 14 mmol/L    BUN 19 9 - 23 mg/dL    Creatinine 6.04 5.40 - 1.30 mg/dL    BUN/Creatinine Ratio 17     EGFR CKD-EPI Non-African American, Male 70 >=60 mL/min/1.59m2    EGFR CKD-EPI African American, Male 39 >=60 mL/min/1.55m2    Glucose 93 70 - 179 mg/dL    Calcium 98.1 8.7 - 19.1 mg/dL   PT-INR Result Value Ref Range    PT 11.8 10.5 - 13.5 sec    INR 0.99    AFP non-maternal tumor marker   Result Value Ref Range    AFP-Tumor Marker 3 <=8 ng/mL   CBC w/ Differential   Result Value Ref Range    WBC 5.9 3.5 - 10.5 10*9/L    RBC 3.82 (L) 4.32 - 5.72 10*12/L    HGB 13.6 13.5 - 17.5 g/dL    HCT 47.8 29.5 - 62.1 %    MCV 104.2 (H) 81.0 - 95.0 fL    MCH 35.6 (H) 26.0 - 34.0 pg    MCHC 34.1 30.0 - 36.0 g/dL    RDW 30.8 65.7 - 84.6 %    MPV 7.1 7.0 - 10.0 fL    Platelet 190 150 - 450 10*9/L    Neutrophils % 66.4 %    Lymphocytes % 23.3 %    Monocytes % 8.1 %    Eosinophils % 1.4 %    Basophils % 0.8 %    Absolute Neutrophils 3.9 1.7 - 7.7 10*9/L    Absolute Lymphocytes 1.4 0.7 - 4.0 10*9/L    Absolute Monocytes 0.5 0.1 - 1.0 10*9/L    Absolute Eosinophils 0.1 0.0 - 0.7 10*9/L    Absolute Basophils 0.0 0.0 - 0.1 10*9/L     Assessment/Plan:   [name] is a 64 y.o. Caucasian male with decompensated HCV cirrhosis: PMH of cirrhosis with HE. HE has appeared well controlled with total 30 ml of lactulose daily and Xifaxan 550 mg bid. His two biggest complaints remain fatigue and memory recall disturbance. We hoped that use of CPAP would improve fatigue. He's been using CPAP for the past two months @ and some improvement. Memory recall has worsen. Unable to recall family members names. No history of an overt decompensated event such as upper GI bleed  or ascites. Fibroscan is consistent with F4 disease. Continued evidence of good synthetic liver function. Stable MELD score of 7.      ~ MELD labs, AFP tumor marker ordered  ~ Office follow up middle of February 2022  ~ Avoid alcohol use.     HE: Continue lactulose 15 ml bid and Xifaxan 550 mg bid. Goal of two to three bowel movements daily.    HCC screening ~Up to date.History of LRAD-2 and LRAD- lesions in the setting of cirrhosis. Warrants surveillance every six months. Next MRI of abdomen already scheduled for September 09, 2020.    Osteoporosis evaluation ~Under the care of endocrinology at [facility]     Tobacco abuse, continued ~ Smoking cessation discussed again.     Pulmonary nodules ~ Pulmonary nodule noted CT scan chest 03/2020 with radiology recommendation for surveillance six months. Patient already scheduled.     Skin cancer ~ Under the care of dermatology.     Nutritional status ~ Continue with high caloric diet with continuation of at least two cans of Boost daily. Congratulated on successful weight gain.     Variceal Screening~ No history of varices. Repeat upper endoscopy not necessarily recommended if remains well compensated given PLT count >150 and fibroscan <25 kPa.    History of Bradycardia with evidence of VTach as noted above ~Under the care of cardiology.     History of prostate cancer ~ Under the care of oncology. Treatment inclusive of Lupron injections and radiation treatment.     Chronic sinusitis (fungal): Under the care of ENT.     Memory Disturbance: Referral order placed for neurology.     All patient's questions were answered to their satisfaction during visit today.     Did speak with patient's wife on phone during this visit. Her biggest complaint surrounded her husband's neurological status. In agreement with referral to neurology.     Rodman Key, DNP, FNP-BC  [facility]  [address]  [place] [address]  Phone [phone]

## 2020-04-18 LAB — ZINC: Zinc:MCnc:Pt:Ser/Plas:Qn:: 0.72

## 2020-04-24 MED ORDER — TAMSULOSIN 0.4 MG CAPSULE
ORAL_CAPSULE | 1 refills | 0 days | Status: CP
Start: 2020-04-24 — End: ?

## 2020-04-28 NOTE — Unmapped (Signed)
[facility] Refill Coordination Note    Specialty Medication(s) to be Shipped:   Infectious Disease: Xifaxan    Other medication(s) to be shipped: No additional medications requested for fill at this time     Harold Richards, DOB: Sep 15, 1955  Phone: [phone] (home)       All above HIPAA information was verified with patient.     Was a Nurse, learning disability used for this call? No    Completed refill call assessment today to schedule patient's medication shipment from the [name] [phone]).       Specialty medication(s) and dose(s) confirmed: Regimen is correct and unchanged.   Changes to medications: Harold Richards reports no changes at this time.  Changes to insurance: No  Questions for the pharmacist: No    Confirmed patient received Welcome Packet with first shipment. The patient will receive a drug information handout for each medication shipped and additional FDA Medication Guides as required.       DISEASE/MEDICATION-SPECIFIC INFORMATION        N/A    SPECIALTY MEDICATION ADHERENCE     Medication Adherence    Patient reported X missed doses in the last month: 0  Specialty Medication: xifaxan 550mg             Unable to confirm quantity on hand      SHIPPING     Shipping address confirmed in Epic.     Delivery Scheduled: Yes, Expected medication delivery date: 9/28.     Medication will be delivered via Next Day Courier to the prescription address in Epic WAM.    [name]   [facility] Specialty Technician

## 2020-05-04 MED FILL — XIFAXAN 550 MG TABLET: 30 days supply | Qty: 60 | Fill #1 | Status: AC

## 2020-05-04 MED FILL — XIFAXAN 550 MG TABLET: ORAL | 30 days supply | Qty: 60 | Fill #1

## 2020-05-25 ENCOUNTER — Encounter
Admit: 2020-05-25 | Discharge: 2020-05-26 | Payer: PRIVATE HEALTH INSURANCE | Attending: MOHS-Micrographic Surgery | Primary: MOHS-Micrographic Surgery

## 2020-05-25 NOTE — Unmapped (Signed)
Harold Carpen, MD    POST-OPERATIVE WOUND CARE INSTRUCTIONS    1. Take it easy for at least 1 week after your surgery.  Strenuous activity can cause bleeding and separation of your wound.  Avoid running, weight lifting and swimming during this time period.  2. Keep the wound elevated as much as possible; if the wound is on your face, try to sleep in a recliner or on an extra pillow (do NOT sleep face down).  3. Typical post-op pain consists of soreness or mild tenderness usually lasting less than 48 hours.  This is often relieved with extra-strength Tylenol every 8 hours.  Avoid aspirin, NSAIDS (Motrin, Aleve, Advil, etc.), or alcohol for at least the first week (these can cause bleeding).  4. If bleeding occurs, remove the wet bandage then apply firm pressure for 20 minutes (timed by the clock). You can also use an ice-pack to decrease the swelling and bleeding.  If it persists, apply pressure for one more round of 20 minutes.  If it continues, call our office or go to the nearest ER.    7 day bandage change:  1. Do not get the pressure bandage wet.  In 2-3 days, you can remove only the large outer bandage (all white).  If the bandage has become soaked with blood or has loosened before this time, you may replace it with a clean bandage.  Please call with any questions.  2. Usually, we will remove the inner flat bandage (all brown) at your follow-up in approximately 1 week (7 days) unless otherwise noted.  3.    After all bandaging is removed follow the cleaning instructions below.  *It is normal to have dried blood or a small amount of bleeding when the pressure bandage is removed.    3 day bandage change:  1. Remove all bandaging (both outer and inner) in 72 hours (3 days).  2.   After all bandages are removed follow the cleaning instructions below.  *It is normal to have dried blood or a small amount of bleeding when the bandage is removed.    Cleaning:  Do the following every day (TWICE A DAY) for at least 1 month (or until the wound is healed) after all bandages are removed:  ??? Clean the surgical area with a white vinegar/distilled water solution (1-2 tbsp white vinegar* in 1 cup of distilled water).  Soak cotton balls/q-tips in this solution and saturate the wound for 2 minutes.  Use a soaked cotton ball or q-tip to gently remove any crust or drainage.  Gently pat dry.  Crusts and scabs slow down wound healing; gently remove as much as you can.  ??? Apply Vaseline ointment or Polysporin (not Neosporin) to the wound and over any incision lines, stitches, and the surrounding area.  Be generous with the Vaseline and do not let the wound become dry and crusted. (Continue this for 1-2 months or until healed).  ??? Cover with a bandage if needed, especially if you will be in a dirty or dusty environment (or if in direct sun); you may also want to cover before going to bed.    *We recommend using a new bottle of distilled water, a new bottle of white vinegar, and a new container of Vaseline to reduce risk of infection    Check the wound for extensive swelling, bleeding or redness.  It is normal to have some mild oozing, swelling and redness for a few days after surgery.  If the wound  appears infected (increasing redness, significant warmth, extreme tenderness, thick yellow discharge), call our office.    Important tips and points:  *The better your wound care, the better the final appearance of your surgical site and any resulting scar.  *Although your wound may appear healed in 1-2 weeks, it takes at least 3 months for complete healing and 12 months for it to take its final appearance.  *Numbness in the surgical area is expected, and it may take 12-18 months for the feeling to return to normal.  During this time sensations of itching, tingling, and occasional sharp pains might be noted.  These feelings are normal and will subside once the nerves in the skin have healed.  *By limiting the tension across your wound for 3 months, you will minimize any wide ???spread scar??? appearance.  Therefore, try to avoid any very strenuous exercise or heavy lifting for at least 1 month.  Wounds regain only 30% of their tensile strength at 1 month (and only 80% at one year).  *For wounds around the lips and mouth, avoid any wide opening???sorry, no biting into an apple or hamburger for a while.  Try to cut up your food and take little bites. If you floss, use flossing sticks instead of string to minimize the wide mouth opening.  *Avoid sun exposure to your wound and scar to prevent any discoloration or prolonged redness.  You may use sunscreen after it has healed with new skin (about 4-6 weeks).  Cover it up with Vaseline and a bandage until new skin has appeared.  *For FLAPS/GRAFTS: A skin flap or skin graft needs a good blood supply to survive.  Avoid any trauma, friction, or rubbing to the area which can disrupt new blood vessels.  Smoking can decrease the blood supply, so please do not smoke.  Keep the surgical site elevated by sleeping in a recliner or on extra pillows (do NOT sleep face-down).      Please call the [facility] if you have any questions: [phone]  (If after-hours, you may call [phone] and ask for the dermatology resident physician on-call)              -----SUNSCREEN INFORMATION-----    What should I look for in a sunscreen?  ??? Blocks both UVA and UVB rays (Broad Spectrum)  ??? Sunscreen of at least SPF 30 or higher   ??? Physical blockers with titanium dioxide or zinc oxide are great at blocking UV rays and are less likely to cause a skin allergy.  ??? Water resistant for up to either 40 or 80 minutes. Sunscreens are not waterproof or sweat proof.  ??? Some recommended brands include, but are not limited to: Neutrogena, Vanicream, [employer], [place], [place], Cetaphil    When should I use my sunscreen?  ??? Apply sunscreen every day if you will be outside for longer than 10-15 minutes.   ??? UVA rays (cause aging and skin cancer) can penetrate through clouds and windows, including car windows.   ??? We recommend using a lotion with broad spectrum SPF 30 on a daily basis to the face.     How do I use my sunscreen?  ??? Apply sunscreen liberally. A good rule of thumb is to use one ounce (enough to fill a shot glass) for the body. This may change depending on your body size though.  ??? Apply 15 minutes before going outside.  ??? Reapply sunscreen at least every 2 hours, especially after  swimming or heavy sweating. No sunscreens are sweat proof or waterproof.   ??? Remember to protect your lips with a lip balm that contains sunscreen of at least SPF 30.    Are there options besides sunscreen?  ??? Of course!  ??? Avoidance measures:  - Avoid peak hours between 10 am to 4 pm, when the UV rays are the strongest.  - Limit exposure to reflective surfaces such as water, sand, and snow as UV rays can be reflected from these surfaces.  ??? Protective measures:  - Wear a wide-brimmed hat with at least 3 inch brim.  - Sun protective clothing is an excellent option, with clothing, the SPF is called UPF. Look for a UPF of 30 to 50+.  - Recommended brands include: Coolibar, Solumbra, and Sunday Afternoons.   - Check out sporting goods stores such as [employer] and [employer] as well.     Is there a safe way to tan?  ??? Tanning booths are NOT safer than sun exposure. They damage skin just as real sunlight does and lead to wrinkling and skin cancer.  ??? Consider sunless tanning products such as Jergens or L???Oreal. Remember the tan produced by these products does not actually protect you from the sun so continue to apply your sunscreen regularly.

## 2020-05-25 NOTE — Unmapped (Addendum)
Consult from:    Rory Percy, MD     Reason for visit/CC:   1) Basal cell carcinoma, right preauricular  2) Basal cell carcinoma, left cheek    HPI: Pt is seen in consultation at the request of the above provider for evaluation and treatment options, including Mohs micrographic surgery, for the above tumors.  These lesions have not been treated in the past. They have been present for 6 months and caused symptoms such as discoloration, slight growth, but no pain, bleeding, ulceration, drainage, tingling, or other symptoms.  No other significant lesions of concern.      Medications and allergies: see patient chart.    Review of systems: Reviewed 12 systems and notable for skin cancer as above and COPD, hepatitis C, cirrhosis.  All other systems reviewed are unremarkable/negative, unless noted in the HPI. Past medical history, surgical history, family history, social history were also reviewed and are noted in the chart/questionnaire.      PE: Well-developed, well-nourished, alert and oriented x 4. Vitals reviewed in chart (if available).  Exam reveals a 0.7 cm ill-defined erythematous patch and biopsy scar on the right preauricular cheek near the superior helical attachment and a 0.6 cm erythematous patch and biopsy scar on the left upper cutaneous lip below the nasolabial fold (labeled left cheek on path report but more accurately lip because it is below the NLF). The adjacent skin on the cheek, lip, temple, ear revealed photodamage, rhytids, telangiectasias, scattered brown macules.        Assessment:     1) Basal cell carcinoma, right preauricular (preauricular cheek)  2) Basal cell carcinoma, left cheek (left upper cutaneous lip)  3) photodamage  4) solar lentigenes  5) hx of NMSC      Plan:   1. Due to size, location, pathology and ill-defined margins and the likelihood of subclinical extension as well as the need to conserve normal surrounding tissue, the patient and tumors were deemed appropriate for Mohs micrographic surgery (MMS).  The nature and purpose of the procedure, associated benefits and risks including recurrence and scarring, possible complications such as pain, infection, and bleeding, and alternative methods of treatment if appropriate were discussed with the patient during the consent. The diagnosis was verified by reviewing the pathology report. The lesion location was verified by the patient, by reviewing previous notes, pathology reports, and by photographs as well as angulation measurements if available.  Informed consent was reviewed and signed by the patient, and timeout was performed at 0842. See op note below. (Note that per the definition of Mohs micrographic surgery, the Mohs surgeon listed below acted as both the surgeon and pathologist for evaluation of the Mohs sections)  2. For the photodamage and solar lentigenes, sun protection discussed/information given on OTC sunscreens, and we recommend continued regular follow-up with primary dermatologist every 6 months or sooner for any growing, bleeding, or changing lesions.    Accession: [id number]   MOHS MICROGRAPHIC SURGERY - right preauricular/cheek  Attending surgeon:                      Coralie Carpen, MD  Assistants:                                   Dr.Miles, Alfonzo Feller  Clinical diagnosis:  As above  Tumor location:                            As above  Tumor size:                                  As above  Anesthesia:                                  1% lidocaine with epinephrine  Total volume of anesthesia:         <10ccs  Preparation:                                 2% chlorhexidine, 70% isopropyl alcohol/Chlora-prep  Surgical defect/wound size:         2.3x2.2 cm  Postoperative dx:                         Same  Preoperative medications:            None  Perioperative medications:           None  Postoperative medications:          None  EBL:                                             <5ccs  Complications: None     Stages:  Stage 1: The patient was positioned, marked, prepped, and anesthetized with local anesthesia as above.  The tumor was first debulked and then excised with an approx. 2mm margin.  Hemostasis was achieved with electrocautery as needed.  The specimen was then oriented, subdivided/relaxed, inked, and processed using Mohs technique.  Evaluation of slides by the Mohs surgeon revealed residual tumor at the margins.  Pathology: Groups of atypical basaloid cells extending from the epidermis into the dermis         Stage 2: An additional 2mm margin was excised.  Hemostasis was achieved with electrocautery as needed.  The specimen was then oriented, subdivided/relaxed, inked, and processed using Mohs technique.  Evaluation of the slides by the Mohs surgeon revealed residual tumor at the margins.  Pathology: Groups of atypical basaloid cells extending from the epidermis and superficial portions of the adnexal structures          Stage 3: An additional 3-29mm margin was excised.  Hemostasis was achieved with electrocautery as needed.  The specimen was then oriented, subdivided/relaxed, inked, and processed using Mohs technique.  Evaluation of the slides by the Mohs surgeon revealed residual tumor at the margins.  Pathology: same as above          Stage 4: An additional 3-75mm margin was excised.  Hemostasis was achieved with electrocautery as needed.  The specimen was then oriented, subdivided/relaxed, inked, and processed using Mohs technique.  Evaluation of the slides by the Mohs surgeon revealed residual tumor at the margins.  Pathology: same as above       Stage 5: An additional 2-22mm margin was excised.  Hemostasis was achieved with electrocautery as needed.  The specimen was then oriented, subdivided/relaxed, inked, and processed  using Mohs technique.  Evaluation of slides by the Mohs surgeon revealed clear tumor margins.     RECONSTRUCTION:  Primary linear closure/S-plasty  Operation:    Complex linear closure of the above defect  Anesthesia:    As above  Total volume of anesthesia:  <10ccs  Preparation:    As above  SQ suture:    5-0 vicryl   Cutaneous suture:   6-0 gut  Closed wound length:  5.4 cm  Postoperative medications:  None  EBL:     <5ccs  Complications:   None  The surgical wound was then cleaned, prepped, and re-anesthetized as above. Wound edges were undermined extensively along at least one entire edge and at a distance equal to or greater than the width of the defect (see wound defect size above) in order to achieve closure and decrease wound tension and anatomic distortion. Redundant tissue repair including standing cone removal was performed. Hemostasis was achieved with electrocautery. Subcutaneous and epidermal tissues were approximated with the above sutures.        MOHS MICROGRAPHIC SURGERY - left cheek/upper cutaneous lip  Staff surgeon:                              Coralie Carpen, MD  Assistants:                                   As above  Clinical diagnosis:                        As above  Tumor location:                            As above  Tumor size:                                  As above  Anesthesia:                                  1% lidocaine with epinephrine  Total volume of anesthesia:         <10ccs  Preparation:                                 2% chlorhexidine, 70% isopropyl alcohol/Chlora-prep  Surgical defect/wound size:         0.9x0.8 cm  Postoperative dx:                         Same  Preoperative medications:            None  Perioperative medications:           None  Postoperative medications:          None  EBL:                                             <5ccs  Complications:  None     Stages:  Stage 1: The patient was positioned, marked, prepped, and anesthetized with local anesthesia as above.  The tumor was first debulked and then excised with an approx. 2mm margin.  Hemostasis was achieved with electrocautery as needed.  The specimen was then oriented, subdivided/relaxed, inked, and processed using Mohs technique.  Evaluation of slides by the Mohs surgeon revealed residual tumor at the margins.  Pathology: Groups of atypical basaloid cells extending from the epidermis into the dermis       Stage 2: An additional 2mm margin was excised.  Hemostasis was achieved with electrocautery as needed.  The specimen was then oriented, subdivided/relaxed, inked, and processed using Mohs technique.  Evaluation of slides by the Mohs surgeon revealed clear tumor margins.     RECONSTRUCTION:  Primary linear closure  Operation:    Complex linear closure of the above defect  Anesthesia:    As above  Total volume of anesthesia:  <10ccs  Preparation:    As above  SQ suture:    5-0 vicryl   Cutaneous suture:   6-0 gut  Closed wound length:  2.6 cm  Postoperative medications:  None  EBL:     <5ccs  Complications:   None  The surgical wound was then cleaned, prepped, and re-anesthetized as above. Wound edges were undermined extensively along at least one entire edge and at a distance equal to or greater than the width of the defect (see wound defect size above) in order to achieve closure and decrease wound tension and anatomic distortion. Redundant tissue repair including standing cone removal was performed. Hemostasis was achieved with electrocautery. Subcutaneous and epidermal tissues were approximated with the above sutures.      The surgical sites were lightly scrubbed with sterile, saline-soaked gauze. The areas were then bandaged using Vaseline ointment, non-adherent gauze, gauze pads, and tape to provide an adequate pressure dressing. The patient tolerated the procedure well, was given detailed written and verbal wound care instructions, and was discharged in good condition. The patient will follow-up: as needed with me.

## 2020-05-28 ENCOUNTER — Ambulatory Visit
Admit: 2020-05-28 | Discharge: 2020-06-07 | Payer: PRIVATE HEALTH INSURANCE | Attending: Radiation Oncology | Primary: Radiation Oncology

## 2020-05-28 DIAGNOSIS — C61 Malignant neoplasm of prostate: Principal | ICD-10-CM

## 2020-05-28 LAB — PROSTATE SPECIFIC ANTIGEN: Prostate specific Ag:MCnc:Pt:Ser/Plas:Qn:: <0.04

## 2020-05-28 NOTE — Unmapped (Signed)
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  PSA today <0.04.    RECOMMENDATIONS:  1. PSA today is <0.04.   We discussed that PSA can rise as testosterone recovers after he finishes ADT  2. ADT:  Completed- last injection 11/27/2019- discussed that this shot lasts ~3 months so testosterone should begin recovering now but generally takes several months  3. FOLLOW-UP:  He will return in 3 months for routine follow-up and PSA check  4. GU:  Stable- Continue flomax BID  5. GI:  No issues- colonoscopy in 2017 with recommendation for next scope in 2022    INTERVAL HISTORY:      He continues to to do quite well without any major issues since our last follow-up.  His urination is stable.  Nocturia 2-3x, no change in daytime urination or stream.  Rare urgency, no dysuria, no incontinence.  Some occasional loose stools but nothing too concerning- no bleeding or pain.  He still has a few hot flashes per day, unchanged from prior- not overly bothersome.  His wife is going through RT for breast cancer currently.  He is retired from [employer] and enjoying spending time with his grandkids regularly.    Baseline  General:??Very fatigued. No ED  Urinary:??On flomax, post-void residual 0 cc.??Has frequency, nocturia 2-3x/night, no urgency, hematuria, dysuria.??  GI: Denies loose stools, rectal pain, or bleeding.   Colonoscopy:??Done 10/2015, found polyp. ??Repeat in 5 years.    REVIEW OF SYSTEMS:  A comprehensive review of 10 systems was negative except for pertinent positives noted in HPI.    PAST MEDICAL HISTORY/FAMILY HISTORY/SOCIAL HISTORY:  Reviewed in EPIC    ALLERGIES/MEDICATIONS:  Reviewed in EPIC    PHYSICAL EXAM:  Vital Signs for this encounter:   BP 123/66  - Pulse 62  - Temp 37.1 ??C (98.8 ??F) (Temporal)  - Wt 55.7 kg (122 lb 11.2 oz)  - SpO2 96%  - BMI 19.80 kg/m??   Karnofsky/Lansky Performance Status: 90,  Able to carry on normal activity; minor signs or symptoms of disease (ECOG equivalent 0)  General:   No acute distress, alert and oriented X 4   Head: Normocephalic, without obvious abnormality, atraumatic  Eyes: EOMI, no scleral icterus  Lungs: Normal work of breathing  Heart: regular rate and rhythm, S1, S2 normal, no murmur, click, rub or gallop  Abdomen: non-distended  Extremities: extremities normal, atraumatic, no cyanosis or edema  Lymph nodes: No palpable supraclavicular or cervical lymphadenopathy  Neurologic: Grossly normal   Rectal: Deferred      RADIOLOGY:  No new imaging to review    Labs:    PSA   Date Value Ref Range Status   05/28/2020 <0.04 0.00 - 4.00 ng/mL Final   02/27/2020 <0.04 0.00 - 4.00 ng/mL Final   08/28/2019 <0.10  0.00 - 4.00 ng/mL Final   01/16/2019 <0.10 0.00 - 4.00 ng/mL Final   05/18/2018 4.59 (H) 0.00 - 4.00 ng/mL Final         Rayetta Humphrey, MD  Assistant Professor  [facility] Dept of Radiation Oncology  05/28/2020

## 2020-05-28 NOTE — Unmapped (Signed)
[facility] Refill Coordination Note    Specialty Medication(s) to be Shipped:   Infectious Disease: Xifaxan    Other medication(s) to be shipped: No additional medications requested for fill at this time     Harold Richards, DOB: Sep 29, 1955  Phone: [phone] (home)       All above HIPAA information was verified with patient.     Was a Nurse, learning disability used for this call? No    Completed refill call assessment today to schedule patient's medication shipment from the [facility] [phone]).       Specialty medication(s) and dose(s) confirmed: Regimen is correct and unchanged.   Changes to medications: Harold Richards reports no changes at this time.  Changes to insurance: No  Questions for the pharmacist: No    Confirmed patient received Welcome Packet with first shipment. The patient will receive a drug information handout for each medication shipped and additional FDA Medication Guides as required.       DISEASE/MEDICATION-SPECIFIC INFORMATION        N/A    SPECIALTY MEDICATION ADHERENCE     Medication Adherence    Patient reported X missed doses in the last month: 0  Specialty Medication: XIFAXAN 550MG   Patient is on additional specialty medications: No  Patient is on more than two specialty medications: No  Any gaps in refill history greater than 2 weeks in the last 3 months: no  Informant: patient  Reliability of informant: reliable  Provider-estimated medication adherence level: good  Patient is at risk for Non-Adherence: No                  XIFAXAN 550 mg: 7 days of medicine on hand       SHIPPING     Shipping address confirmed in Epic.     Delivery Scheduled: Yes, Expected medication delivery date: 10/26.     Medication will be delivered via Next Day Courier to the prescription address in Epic WAM.    [name]   [facility] Specialty Technician

## 2020-06-01 MED FILL — XIFAXAN 550 MG TABLET: ORAL | 30 days supply | Qty: 60 | Fill #2

## 2020-06-01 MED FILL — XIFAXAN 550 MG TABLET: 30 days supply | Qty: 60 | Fill #2 | Status: AC

## 2020-06-23 NOTE — Unmapped (Signed)
[facility] Clinical Assessment & Refill Coordination Note    Harold Richards, [place]: [birth year]  Phone: [phone] (home)     All above HIPAA information was verified with patient.     Was a Nurse, learning disability used for this call? No    Specialty Medication(s):   Infectious Disease: Xifaxan     Current Outpatient Medications   Medication Sig Dispense Refill   ??? albuterol HFA 90 mcg/actuation inhaler Inhale 2 puffs every four (4) hours as needed for wheezing. 3 Inhaler 3   ??? aspirin (ECOTRIN) 81 MG tablet Take 81 mg by mouth daily.     ??? atorvastatin (LIPITOR) 20 MG tablet Take 1 tablet (20 mg total) by mouth nightly. 90 tablet 3   ??? cholecalciferol, vitamin D3, (VITAMIN D3) 1,000 unit capsule Take 1,000 Units by mouth daily.     ??? cyanocobalamin (VITAMIN B-12) 250 MCG tablet Take 250 mcg by mouth daily.     ??? ferrous sulfate 325 mg (65 mg iron) CpER Take 1 tablet by mouth daily.      ??? fluticasone propion-salmeterol (ADVAIR DISKUS) 250-50 mcg/dose diskus Inhale 1 puff Two (2) times a day. 3 each 3   ??? guaiFENesin (MUCINEX) 600 mg 12 hr tablet Take 600 mg by mouth every morning.     ??? lactulose (CHRONULAC) 10 gram/15 mL solution Take 15 mL (10 g total) by mouth Two (2) times a day. 240 mL 0   ??? loratadine (CLARITIN) 10 mg tablet Take 10 mg by mouth daily.     ??? multivitamin (TAB-A-VITE/THERAGRAN) per tablet Take 1 tablet by mouth daily.     ??? rifAXIMin (XIFAXAN) 550 mg Tab Take 1 tablet (550 mg total) by mouth Two (2) times a day. 60 tablet 6   ??? sertraline (ZOLOFT) 25 MG tablet  (Patient not taking: Reported on 04/15/2020)     ??? SUBOXONE 8-2 mg sublingual film Place 1 Film under the tongue Two (2) times a day.   0   ??? tamsulosin (FLOMAX) 0.4 mg capsule TAKE 1 CAPSULE(0.4 MG) BY MOUTH TWICE DAILY 180 capsule 1   ??? vit C,E-Zn-coppr-lutein-zeaxan (PRESERVISION AREDS 2) 250-200-40-1 mg-unit-mg-mg cap Take 1 tablet by mouth Two (2) times a day.       No current facility-administered medications for this visit.        Changes to medications: [name] reports no changes at this time.    Allergies   Allergen Reactions   ??? Bicalutamide Rash   ??? Penicillins Rash   ??? Rocephin [Ceftriaxone] Rash     Patient received in Emergency Room and reported rash to stomach and back several hours later.        Changes to allergies: No    SPECIALTY MEDICATION ADHERENCE     Xifaxan 550 mg: approximately 12 days of medicine on hand       Medication Adherence    Patient reported X missed doses in the last month: 0  Specialty Medication: Xifaxan 550mg   Patient is on additional specialty medications: No  Any gaps in refill history greater than 2 weeks in the last 3 months: no  Demonstrates understanding of importance of adherence: yes  Informant: patient  Provider-estimated medication adherence level: good  Patient is at risk for Non-Adherence: No          Specialty medication(s) dose(s) confirmed: Regimen is correct and unchanged.     Are there any concerns with adherence? No    Adherence counseling provided? Not needed  CLINICAL MANAGEMENT AND INTERVENTION      Clinical Benefit Assessment:    Do you feel the medicine is effective or helping your condition? Yes    Clinical Benefit counseling provided? Not needed    Adverse Effects Assessment:    Are you experiencing any side effects? No    Are you experiencing difficulty administering your medicine? No    Quality of Life Assessment:    How many days over the past month did your cirrhosis/hepatic encephalopathy  keep you from your normal activities? For example, brushing your teeth or getting up in the morning. 0    Have you discussed this with your provider? Not needed    Therapy Appropriateness:    Is therapy appropriate? Yes, therapy is appropriate and should be continued    DISEASE/MEDICATION-SPECIFIC INFORMATION      N/A    PATIENT SPECIFIC NEEDS     - Does the patient have any physical, cognitive, or cultural barriers? No    - Is the patient high risk? No    - Does the patient require a Care Management Plan? No     - Does the patient require physician intervention or other additional services (i.e. nutrition, smoking cessation, social work)? No      SHIPPING     Specialty Medication(s) to be Shipped:   Infectious Disease: Xifaxan    Other medication(s) to be shipped: No additional medications requested for fill at this time     Changes to insurance: No    Delivery Scheduled: Yes, Expected medication delivery date: 06/30/20.     Medication will be delivered via Next Day Courier to the confirmed prescription address in [facility].    The patient will receive a drug information handout for each medication shipped and additional FDA Medication Guides as required.  Verified that patient has previously received a Conservation officer, historic buildings.    All of the patient's questions and concerns have been addressed.    Roderic Palau   [facility] Specialty Pharmacist

## 2020-06-26 DIAGNOSIS — J438 Other emphysema: Principal | ICD-10-CM

## 2020-06-26 MED ORDER — FLUTICASONE 250 MCG-SALMETEROL 50 MCG/DOSE BLISTR POWDR FOR INHALATION
Freq: Two times a day (BID) | RESPIRATORY_TRACT | 3 refills | 2.00000 days | Status: CP
Start: 2020-06-26 — End: 2020-09-01

## 2020-06-26 MED ORDER — ALBUTEROL SULFATE HFA 90 MCG/ACTUATION AEROSOL INHALER
RESPIRATORY_TRACT | 3 refills | 0 days | Status: CP | PRN
Start: 2020-06-26 — End: ?

## 2020-06-26 NOTE — Unmapped (Signed)
Advair  E-Prescribing Status: Receipt confirmed by pharmacy (06/26/2020 ??2:01 PM EST)  Prior authorization: Closed - Other      Albuterol  E-Prescribing Status: Receipt confirmed by pharmacy (06/26/2020 ??2:01 PM EST)  Prior authorization: Closed - Electronic Prior Authorization not supported. Submit via other methods.    Christinia Gully, [facility]  Pharmacy    [facility] [phone] - [place], [place] - [address] AT [facility] OF [address]   [address], [place] [address]   Phone:  [phone] ??Fax:  [phone]       Last seen: 02/25/2020    Patient is scheduled with PCP on 09/01/2020.

## 2020-06-29 NOTE — Unmapped (Signed)
Harold Richards 's [name] shipment will be sent out  as a result of the medication is too soon to refill until 11/24.     I have reached out to the patient and communicated the delivery change. We will reschedule the medication for the delivery date that the patient agreed upon.  We have confirmed the delivery date as 11/24, via same day courier.

## 2020-06-29 NOTE — Unmapped (Signed)
Prior authorization sent to plan on 06/29/2020 with/ from [facility] via [employer].    Medication: Albuterol Sulfate HFA 108 (90 Base)MCG/ACT aerosol      Reference ID: Key: [id number]       Status: waiting for plan determination; determination expected within 72 hours via CoverMyMeds website.    Pharmacy Notified: not at this time.    Patient Notified: not at this time.    Provider Notified: not at this time.    Clinical Information: Milinda Pointer, CMA.

## 2020-06-29 NOTE — Unmapped (Signed)
Prior authorization sent to plan on 06/29/2020 with/ from [facility] via [employer].    Medication: Fluticasone-Salmeterol 250-50MCG/DOSE aerosol powder      Reference ID: Key: [id number]       Status: waiting for plan determination; determination expected within 72 hours via CoverMyMeds website.    Pharmacy Notified: not at this time.    Patient Notified: not at this time.    Provider Notified: not at this time.    Clinical Information: Milinda Pointer, CMA.

## 2020-06-30 NOTE — Unmapped (Signed)
Albuterol Sulfate HFA 108 (90 Base)MCG/ACT aerosol  Approved: Effective from 06/29/2020 through 06/28/2021.

## 2020-06-30 NOTE — Unmapped (Signed)
Fluticasone-Salmeterol 250-50MCG/DOSE aerosol powder  Was cancelled by insurance. Waiting fax for more details.

## 2020-07-01 MED FILL — XIFAXAN 550 MG TABLET: 30 days supply | Qty: 60 | Fill #3 | Status: AC

## 2020-07-01 MED FILL — XIFAXAN 550 MG TABLET: ORAL | 30 days supply | Qty: 60 | Fill #3

## 2020-07-04 ENCOUNTER — Ambulatory Visit (INDEPENDENT_AMBULATORY_CARE_PROVIDER_SITE_OTHER): Payer: BC Managed Care – PPO

## 2020-07-04 ENCOUNTER — Other Ambulatory Visit: Payer: Self-pay

## 2020-07-04 ENCOUNTER — Ambulatory Visit
Admission: EM | Admit: 2020-07-04 | Discharge: 2020-07-04 | Disposition: A | Discharge status: Home / Self Care | Payer: BC Managed Care – PPO | Attending: Emergency Medicine | Admitting: Emergency Medicine

## 2020-07-04 ENCOUNTER — Encounter: Payer: Self-pay | Admitting: Emergency Medicine

## 2020-07-04 DIAGNOSIS — R059 Cough, unspecified: Secondary | ICD-10-CM | POA: Diagnosis not present

## 2020-07-04 DIAGNOSIS — J441 Chronic obstructive pulmonary disease with (acute) exacerbation: Secondary | ICD-10-CM

## 2020-07-04 MED ORDER — PREDNISONE 50 MG PO TABS: 50.0000 mg | ORAL_TABLET | Freq: Every day | ORAL | 0 refills | Status: AC

## 2020-07-04 MED ORDER — CLARITHROMYCIN 500 MG PO TABS: 500.0000 mg | ORAL_TABLET | Freq: Two times a day (BID) | ORAL | 0 refills | Status: AC

## 2020-07-04 NOTE — ED Provider Notes (Signed)
[facility] [place] [facility] [place]   Name: Vincent Walters DOB: November 24, 1955 MRN: [id number] CSN: [id number] PCP: Patient, No Pcp Per  Arrival date and time:  07/04/20 0954  Chief Complaint:  Cough and Nasal Congestion   NOTE: Prior to seeing the patient today, I have reviewed the triage nursing documentation and vital signs. Clinical staff has updated patient's PMH/PSHx, current medication list, and drug allergies/intolerances to ensure comprehensive history available to assist in medical decision making.   History:   HPI: Vincent Walters is a 64 y.o. male who presents today with complaints of cough.  Patient states the cough initially started approximately 4 weeks ago has a dry cough.  It has since progressed into a wet productive cough over the past week.  He has been treating his cough with over-the-counter Mucinex and has noticed little to no improvement.  Patient is a current smoker.  He is fully vaccinated against COVID-19.   Past Medical History:  Diagnosis Date  . Chronic pain   . COPD (chronic obstructive pulmonary disease) (HCC)     Past Surgical History:  Procedure Laterality Date  . NECK SURGERY     X 2    History reviewed. No pertinent family history.  Social History   Tobacco Use  . Smoking status: Current Every Day Smoker    Packs/day: 1.00    Years: 40.00    Pack years: 40.00    Types: Cigarettes  . Smokeless tobacco: Never Used  Substance Use Topics  . Alcohol use: No    Alcohol/week: 0.0 standard drinks  . Drug use: No    There are no problems to display for this patient.   Home Medications:    Current Meds  Medication Sig  . albuterol (PROVENTIL HFA;VENTOLIN HFA) 108 (90 BASE) MCG/ACT inhaler Inhale 2 puffs into the lungs every 6 (six) hours as needed for wheezing or shortness of breath.  . buprenorphine-naloxone (SUBOXONE) 2-0.5 MG SUBL SL tablet Place 1 tablet under the tongue daily.  . cholecalciferol (VITAMIN D) 1000 UNITS tablet Take  1,000 Units by mouth daily.  . ergocalciferol (VITAMIN D2) 50000 UNITS capsule Take 50,000 Units by mouth once a week.  . Fluticasone-Salmeterol (ADVAIR) 250-50 MCG/DOSE AEPB Inhale 1 puff into the lungs 2 (two) times daily.    Allergies:   Penicillins  Review of Systems (ROS): Review of Systems  Constitutional: Negative for chills, diaphoresis, fatigue and fever.  HENT: Positive for postnasal drip and sinus pressure. Negative for congestion, drooling, ear pain, sinus pain and sneezing.   Respiratory: Positive for cough, shortness of breath and wheezing.   Gastrointestinal: Negative for abdominal pain.  Musculoskeletal: Negative for myalgias.  Neurological: Negative for headaches.     Vital Signs: Today's Vitals   07/04/20 1003 07/04/20 1005  BP: (!) 149/58   Pulse: 72   Temp: 98.3 F (36.8 C)   TempSrc: Oral   SpO2: 97%   PainSc:  0-No pain    Physical Exam: Physical Exam Vitals and nursing note reviewed.  Constitutional:      Appearance: Normal appearance.  HENT:     Right Ear: Tympanic membrane normal.     Left Ear: Tympanic membrane normal.     Mouth/Throat:     Mouth: Mucous membranes are dry.  Eyes:     Pupils: Pupils are equal, round, and reactive to light.  Cardiovascular:     Rate and Rhythm: Normal rate and regular rhythm.     Pulses: Normal pulses.  Heart sounds: Normal heart sounds.  Pulmonary:     Breath sounds: Decreased air movement present. Examination of the right-lower field reveals wheezing. Examination of the left-lower field reveals wheezing. Wheezing present.  Musculoskeletal:     Cervical back: Normal range of motion.  Neurological:     Mental Status: He is alert.      Urgent Care Treatments / Results:   LABS: PLEASE NOTE: all labs that were ordered this encounter are listed, however only abnormal results are displayed. Labs Reviewed - No data to display  EKG: -None  RADIOLOGY: DG Chest 2 View  Result Date:  07/04/2020 CLINICAL DATA:  Cough for 4 weeks EXAM: CHEST - 2 VIEW COMPARISON:  06/03/2005 FINDINGS: The heart size and mediastinal contours are within normal limits. Both lungs are clear. The visualized skeletal structures are unremarkable. IMPRESSION: No active cardiopulmonary disease. Electronically Signed   By: Duanne Guess D.O.   On: 07/04/2020 10:41    PROCEDURES: Procedures  MEDICATIONS RECEIVED THIS VISIT: Medications - No data to display  PERTINENT CLINICAL COURSE NOTES/UPDATES:   Initial Impression / Assessment and Plan / Urgent Care Course:  Pertinent labs & imaging results that were available during my care of the patient were personally reviewed by me and considered in my medical decision making (see lab/imaging section of note for values and interpretations).  Vincent Walters is a 64 y.o. male who presents to [facility] today with complaints of a cough, diagnosed with COPD exacerbation, and treated as such with the medications below. NP and patient reviewed discharge instructions below during visit.   Patient is well appearing overall in clinic today. He does not appear to be in any acute distress. Presenting symptoms (see HPI) and exam as documented above.   I have reviewed the follow up and strict return precautions for any new or worsening symptoms. Patient is aware of symptoms that would be deemed urgent/emergent, and would thus require further evaluation either here or in the emergency department. At the time of discharge, he verbalized understanding and consent with the discharge plan as it was reviewed with him. All questions were fielded by provider and/or clinic staff prior to patient discharge.    Final Clinical Impressions / Urgent Care Diagnoses:   Final diagnoses:  COPD exacerbation (HCC)    New Prescriptions:  [place] Controlled Substance Registry consulted? Not Applicable  Meds ordered this encounter  Medications  . clarithromycin (BIAXIN) 500 MG  tablet    Sig: Take 1 tablet (500 mg total) by mouth 2 (two) times daily for 7 days.    Dispense:  14 tablet    Refill:  0  . predniSONE (DELTASONE) 50 MG tablet    Sig: Take 1 tablet (50 mg total) by mouth daily with breakfast for 5 days.    Dispense:  5 tablet    Refill:  0      Discharge Instructions     You were seen for cough and are being treated for COPD exacerbation.   Take the antibiotics as prescribed until they're finished. If you think you're having a reaction, stop the medication, take benadryl and go to the nearest urgent care/emergency room. Take a probiotic while taking the antibiotic to decrease the chances of stomach upset.   Please try to decrease your smoking.  Take care, Dr. Sharlet Salina, NP-c      Recommended Follow up Care:  Patient encouraged to follow up with the following provider within the specified time frame, or sooner as  dictated by the severity of his symptoms. As always, he was instructed that for any urgent/emergent care needs, he should seek care either here or in the emergency department for more immediate evaluation.   Bailey Mech, DNP, NP-c    Bailey Mech, NP 07/04/20 1100

## 2020-07-04 NOTE — Discharge Instructions (Signed)
You were seen for cough and are being treated for COPD exacerbation.   Take the antibiotics as prescribed until they're finished. If you think you're having a reaction, stop the medication, take benadryl and go to the nearest urgent care/emergency room. Take a probiotic while taking the antibiotic to decrease the chances of stomach upset.   Please try to decrease your smoking.  Take care, Dr. Sharlet Salina, NP-c

## 2020-07-04 NOTE — ED Triage Notes (Signed)
Pt c/o dry cough x 1 month. Pt states in the past couple of weeks he has had some nasal congestion as well. He states his mucus is light greenish yellow. Pt states last year he was dx with pneumonia.

## 2020-07-14 ENCOUNTER — Ambulatory Visit: Admit: 2020-07-14 | Discharge: 2020-07-15 | Payer: PRIVATE HEALTH INSURANCE

## 2020-07-14 DIAGNOSIS — D492 Neoplasm of unspecified behavior of bone, soft tissue, and skin: Principal | ICD-10-CM

## 2020-07-14 DIAGNOSIS — L814 Other melanin hyperpigmentation: Principal | ICD-10-CM

## 2020-07-14 DIAGNOSIS — Z1283 Encounter for screening for malignant neoplasm of skin: Principal | ICD-10-CM

## 2020-07-14 NOTE — Unmapped (Signed)
DERMATOLOGY CLINIC NOTE    ASSESSMENT AND PLAN:     Skin neoplasm:  -A: Left preauricular  -B: Left cheek  -C: Left nose  -D: Left nasal tip  - Biopsy of lesion(s) in question  was performed in typical fashion using 2% lidocaine with epinephrine and electrocautery and aluminum chloride for hemostasis.   - Dermatopathology Order    Benign lesions (lentigines):  -Lesion of concern on right appears to be small purpura.  -Reassurance provided.  -Discussed he has a number of other small lesions remaining on his face which we we will plan to evaluate at his follow-up visit after these biopsy results come back.  -Discussed good photoprotective practices. Discussed smoking cessation can be helpful in preventing nonmelanoma skin cancers. Discussed using sunscreen with SPF, avoiding sun during peak hours, photoprotective clothing.  -Discussed make [place] aware should lesions grow or change significantly and also discussed other worrisome findings such as bleeding.    Return to clinic:  4 months for recheck.       CHIEF COMPLAINT:  Follow-up lesions of concern on face     HPI:   This is a pleasant 64 y.o. male who last saw me on 03/24/2020.  At that time we had done a couple of biopsies.  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 Sallee Lange.     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 Orson Aloe treated years ago  ??  Hepatitis C  Cirrhosis  Emphysema  Osteoporosis  History of prostate cancer  Tobacco use disorder  Opioid use disorder     MEDICATIONS:   Current Outpatient Medications on File Prior to Visit   Medication Sig Dispense Refill   ??? albuterol HFA 90 mcg/actuation inhaler Inhale 2 puffs every four (4) hours as needed for wheezing. 8 g 3   ??? aspirin (ECOTRIN) 81 MG tablet Take 81 mg by mouth daily.     ??? atorvastatin (LIPITOR) 20 MG tablet Take 1 tablet (20 mg total) by mouth nightly. 90 tablet 3   ??? cholecalciferol, vitamin D3, (VITAMIN D3) 1,000 unit capsule Take 1,000 Units by mouth daily.     ??? cyanocobalamin (VITAMIN B-12) 250 MCG tablet Take 250 mcg by mouth daily.     ??? ferrous sulfate 325 mg (65 mg iron) CpER Take 1 tablet by mouth daily.      ??? fluticasone propion-salmeteroL (ADVAIR DISKUS) 250-50 mcg/dose diskus Inhale 1 puff Two (2) times a day. 3 each 3   ??? guaiFENesin (MUCINEX) 600 mg 12 hr tablet Take 600 mg by mouth every morning.     ??? lactulose (CHRONULAC) 10 gram/15 mL solution Take 15 mL (10 g total) by mouth Two (2) times a day. 240 mL 0   ??? loratadine (CLARITIN) 10 mg tablet Take 10 mg by mouth daily.     ??? multivitamin (TAB-A-VITE/THERAGRAN) per tablet Take 1 tablet by mouth daily.     ??? rifAXIMin (XIFAXAN) 550 mg Tab Take 1 tablet (550 mg total) by mouth Two (2) times a day. 60 tablet 6   ??? sertraline (ZOLOFT) 25 MG tablet  (Patient not taking: Reported on 04/15/2020)     ??? SUBOXONE 8-2 mg sublingual film Place 1 Film under the tongue Two (2) times a day.   0   ??? tamsulosin (FLOMAX) 0.4 mg capsule  TAKE 1 CAPSULE(0.4 MG) BY MOUTH TWICE DAILY 180 capsule 1   ??? vit C,E-Zn-coppr-lutein-zeaxan (PRESERVISION AREDS 2) 250-200-40-1 mg-unit-mg-mg cap Take 1 tablet by mouth Two (2) times a day.       No current facility-administered medications on file prior to visit.       ALLERGIES:   Reviewed in epic    SOCIAL HISTORY:  Accompanied by wife who has breast cancer     REVIEW OF SYSTEMS:  Baseline state of health. No recent illnesses. No other skin complaints.     PHYSICAL EXAMINATION:  Examination in the presence of chaperone:  General: Well-developed, well-nourished. No acute distress.   Neuro: Alert and oriented, answers questions appropriately.  Skin: Examination of the scalp, face, head, neck, chest, back, upper extremities, lower extremities, hands, palms, soles was performed and notable for the following:  Pink papule left preauricular  Pink papule left cheek  Pink papule left nose  Subtle pink papule left nasal tip  Small hemorrhagic macule on right shoulder  Scattered small brown evenly pigmented macules and papules on trunk and extremities            Dictation software was used while making this note. Please excuse any errors made with dictation software.

## 2020-07-17 DIAGNOSIS — C44319 Basal cell carcinoma of skin of other parts of face: Principal | ICD-10-CM

## 2020-07-17 NOTE — Unmapped (Signed)
I called and spoke with [name].  We will plan on Mohs surgery for his 3 new basal cell carcinomas.  I have placed a referral for this.  We discussed that I would like to see him back in about 3 or 4 months or a few weeks after his next Mohs surgery to recheck things on his face.  He is aware that he may have other small basal cell carcinomas that will need to be addressed.Robin-tracking, yes, Mohs

## 2020-07-22 NOTE — Unmapped (Signed)
[facility] Refill Coordination Note    Specialty Medication(s) to be Shipped:   Infectious Disease: Xifaxan    Other medication(s) to be shipped: No additional medications requested for fill at this time     Harold Richards, DOB: 06-22-1956  Phone: [phone] (home)       All above HIPAA information was verified with patient.     Was a Nurse, learning disability used for this call? No    Completed refill call assessment today to schedule patient's medication shipment from the [facility] [phone]).       Specialty medication(s) and dose(s) confirmed: Regimen is correct and unchanged.   Changes to medications: Harold Richards reports no changes at this time.  Changes to insurance: No  Questions for the pharmacist: No    Confirmed patient received Welcome Packet with first shipment. The patient will receive a drug information handout for each medication shipped and additional FDA Medication Guides as required.       DISEASE/MEDICATION-SPECIFIC INFORMATION        N/A    SPECIALTY MEDICATION ADHERENCE     Medication Adherence    Patient reported X missed doses in the last month: 0  Specialty Medication: xifaxan 550mg   Patient is on additional specialty medications: No  Patient is on more than two specialty medications: No  Any gaps in refill history greater than 2 weeks in the last 3 months: no  Demonstrates understanding of importance of adherence: yes  Informant: patient  Reliability of informant: reliable  Provider-estimated medication adherence level: good  Patient is at risk for Non-Adherence: No                xifaxan 550 mg: 10 days of medicine on hand          SHIPPING     Shipping address confirmed in Epic.     Delivery Scheduled: Yes, Expected medication delivery date: 12/22.     Medication will be delivered via Next Day Courier to the prescription address in Epic WAM.    Harold Richards   [facility] Specialty Technician

## 2020-07-24 NOTE — Unmapped (Signed)
09/23/20 Mohs Sallee Lange

## 2020-07-29 MED FILL — XIFAXAN 550 MG TABLET: ORAL | 30 days supply | Qty: 60 | Fill #4

## 2020-07-29 MED FILL — XIFAXAN 550 MG TABLET: 30 days supply | Qty: 60 | Fill #4 | Status: AC

## 2020-08-21 NOTE — Unmapped (Signed)
[facility] Refill Coordination Note    Specialty Medication(s) to be Shipped:   Infectious Disease: Xifaxan    Other medication(s) to be shipped: No additional medications requested for fill at this time     Harold Richards, DOB: 02-Jun-1956  Phone: [phone] (home)       All above HIPAA information was verified with patient's family member, Harold Richards.     Was a Nurse, learning disability used for this call? No    Completed refill call assessment today to schedule patient's medication shipment from the [facility] [phone]).       Specialty medication(s) and dose(s) confirmed: Regimen is correct and unchanged.   Changes to medications: Harold Richards reports no changes at this time.  Changes to insurance: No  Questions for the pharmacist: No    Confirmed patient received Welcome Packet with first shipment. The patient will receive a drug information handout for each medication shipped and additional FDA Medication Guides as required.       DISEASE/MEDICATION-SPECIFIC INFORMATION        N/A    SPECIALTY MEDICATION ADHERENCE     Medication Adherence    Patient reported X missed doses in the last month: 0  Specialty Medication: xifaxan 550mg         Unable to confirm quantity on hand      SHIPPING     Shipping address confirmed in Epic.     Delivery Scheduled: Yes, Expected medication delivery date: 1/21.     Medication will be delivered via Next Day Courier to the prescription address in Epic WAM.    Harold Richards   [facility] Specialty Technician

## 2020-08-27 MED FILL — XIFAXAN 550 MG TABLET: ORAL | 30 days supply | Qty: 60 | Fill #5

## 2020-09-01 ENCOUNTER — Encounter: Admit: 2020-09-01 | Discharge: 2020-09-02 | Payer: PRIVATE HEALTH INSURANCE

## 2020-09-01 DIAGNOSIS — R300 Dysuria: Principal | ICD-10-CM

## 2020-09-01 DIAGNOSIS — F1111 Opioid abuse, in remission: Principal | ICD-10-CM

## 2020-09-01 DIAGNOSIS — K7460 Unspecified cirrhosis of liver: Principal | ICD-10-CM

## 2020-09-01 DIAGNOSIS — J438 Other emphysema: Principal | ICD-10-CM

## 2020-09-01 DIAGNOSIS — R310 Gross hematuria: Principal | ICD-10-CM

## 2020-09-01 DIAGNOSIS — C61 Malignant neoplasm of prostate: Principal | ICD-10-CM

## 2020-09-01 MED ORDER — AZITHROMYCIN 500 MG TABLET
ORAL_TABLET | Freq: Every day | ORAL | 0 refills | 15.00000 days | Status: CP
Start: 2020-09-01 — End: 2020-09-22

## 2020-09-01 MED ORDER — PREDNISONE 20 MG TABLET
ORAL_TABLET | Freq: Every day | ORAL | 0 refills | 15 days | Status: CP
Start: 2020-09-01 — End: 2020-09-22

## 2020-09-01 MED ORDER — TRELEGY ELLIPTA 100 MCG-62.5 MCG-25 MCG POWDER FOR INHALATION
Freq: Every day | RESPIRATORY_TRACT | 3 refills | 90 days | Status: CP
Start: 2020-09-01 — End: 2021-09-01

## 2020-09-01 MED ORDER — PREDNISONE 5 MG TABLET
Freq: Every day | ORAL | 0 refills | 0 days | Status: CN
Start: 2020-09-01 — End: ?

## 2020-09-01 NOTE — Unmapped (Signed)
[facility] - [facility]  Established Visit    Reason for Visit:  COPD f/u, dysuria, gross hematuria    1. Gross hematuria    2. Dysuria    3. Other emphysema (CMS-HCC)    4. Prostate cancer (CMS-HCC)    5. Cirrhosis of liver without ascites, unspecified hepatic cirrhosis type (CMS-HCC)    6. Opioid use disorder, mild, in sustained remission, on maintenance therapy (CMS-HCC)            Assessment/Plans:  Gross hematuria and dysuria:  POC UA with no signs of infection, however given current, persistent dysuria and recent gross hematuria, sent UCx.   - ordered CT urogram    COPD (oher emphysema):  Created and reviewed COPD action plan and given to patient.   - sent Rx for azithromycin and prednisone to use as noted in COPD action plan    Prostate Cancer:  Followed by Dr. Carles Collet in Rad Onc    Cirrhosis:  F/u with hepatology as scheduled.  MRI abdomen scheduled on 09/09/2020    Return in about 6 weeks (around 10/13/2020). video    Medication adherence and barriers to the treatment plan have been addressed. Opportunities to optimize healthy behaviors have been discussed. Patient / caregiver voiced understanding.        __________________________________________________________    HPI:  Had COPD exacerbation at end of Nov and treated with clarithromycin and prednisone. Had symptoms for about 1 week before going to urgent care.     About 2 weeks ago, experienced burning with urination and hematuria.  At one point, seemed to be gross hematuria.  Gross hematuria has now resolved, but continues to experience burning pain with urination.  Burning occurs throughout urination and sometimes just at the end.  Cannot tell if he is urinating more frequently.  No fevers or chills. No flank pain.     Meant to have wife come to discuss transfer of management of suboxone to me, however she is dealing with own healht issues regarding her breast cancer.    Medications:  Reviewed in EPIC      Physical Exam:   Vital Signs:   BP 130/63  - Pulse 60  - Temp 35.8 ??C (96.4 ??F) (Temporal)  - Ht 167.6 cm (5' 6)  - Wt 57.6 kg (127 lb)  - SpO2 99%  - BMI 20.50 kg/m??   Gen: Well appearing, NAD  CV: RRR, no murmurs  Pulm: CTA bilaterally, no crackles or wheezes  GU:  No CVA tenderness  GI:  NT, ND, softt

## 2020-09-01 NOTE — Unmapped (Addendum)
Things to do:    - schedule CT scan to evaluate your kidneys and bladder due to the blood in your urine.  Please call Radiology to schedule your imaging test if you have not heard from them.  The number is [phone].  You can request any location in [place], [place], or any other [facility] location closest to you.  The [facility] where our office is located does offere different types of imaging tests, but not all imaging tests are done here.     We'll do a phone/video visit in 6 weeks to talk about your suboxone.       [facility] [address]  [address]  [place], [place] [address]  Main Phone: [phone]  Fax: [phone]  Hours: M-Sun 8am-8pm    Barrie Dunker  09/01/20    Your COPD Action Plan      Usual activity and exercise level. I sometimes have trouble breathing that improves with my medications (rescue inhaler or nebulizer). Usual amounts of cough and phlegm/mucus. Sleeping well at night.    Action:   ??? Take below medications  ??? Avoid cigarette smoke, inhaled irritants, and sick contacts  ??? Use oxygen (if prescribed)  ??? Continue regular exercise/diet plan    Your scheduled COPD medication is:  Trelegy inhaler    Your COPD medication to take as needed if you become short of breath is:  albuterol inhaler    If your medications cost too much, you can apply for help at www.pparx.org, or contact your primary care provider and ask for help.  You can also get help for using your inhaler here:  http://www.FabulousVibe.fi       I have any of the following complaints:  ??? My cough is more frequent or severe  ??? I am more short of breath than usual  ??? I have more or thicker/greener mucus    Action:  ??? Start the following new medications if you have them:          Azithromycin once daily for 3 days and prednisone 40 mg by mouth for 5 days then stop.  ??? Call your primary care provider to tell them that you have started these medications.    If your breathing is not improved after 2-3 days or your symptoms worsen, call your primary care provider for a same day clinic appointment.          I have any of the following:   ??? Very short of breath even at rest  ??? Confusion or difficulty staying awake  ??? Coughing up blood  ??? Chest pain  ??? New or worsening leg swelling   ??? Unable to eat or take care of myself    Action:   CALL 911 or have someone drive you to the closest emergency department.  ??? Bring your medicines with you  ??? Use your rescue inhaler while seeking help every 2 hours if needed  ??? Use oxygen (if prescribed)

## 2020-09-01 NOTE — Unmapped (Signed)
 Internal Medicine at [facility]       Type of visit:  face to face    Reason for visit: follow-up    Questions / Concerns that need to be addressed: none      Screening BP- 130/63      Allergies reviewed: Yes    Medication reviewed: Yes  Pended refills? No        HCDM reviewed and updated in Epic:    We are working to make sure all of our patients??? wishes are updated in Epic and part of that is documenting a Environmental health practitioner for each patient  A Health Care Decision Rodena Piety is someone you choose who can make health care decisions for you if you are not able ??? who would you most want to do this for you????  is already up to date.        BPAs completed:  COPD - MMRC symptom assessment      COVID-19 Vaccine Summary  Which COVID-19 Vaccine was administered  Moderna  Type:  Dates Given:  06/21/2020                   Immunization History   Administered Date(s) Administered   ??? COVID-19 VACCINE,MRNA(MODERNA)(PF)(IM) 10/07/2019, 11/05/2019, 06/21/2020   ??? Hepatitis A 07/28/2016, 01/26/2017   ??? Hepatitis A Vaccine Pediatric / Adolescent 2 Dose IM 03/05/2014   ??? Influenza Vaccine Quad (IIV4 PF) 63mo+ injectable 06/02/2014, 06/16/2016, 04/19/2019   ??? Influenza Virus Vaccine, unspecified formulation 05/18/2017, 05/24/2018, 05/25/2020   ??? PNEUMOCOCCAL POLYSACCHARIDE 23 03/05/2014   ??? TdaP 04/19/2019       __________________________________________________________________________________________    SCREENINGS COMPLETED IN FLOWSHEETS    HARK Screening       AUDIT       PHQ2       PHQ9          P4 Suicidality Screener                GAD7       COPD Assessment       Falls Risk       .imcres

## 2020-09-09 ENCOUNTER — Encounter: Admit: 2020-09-09 | Discharge: 2020-09-09 | Payer: PRIVATE HEALTH INSURANCE

## 2020-09-09 ENCOUNTER — Ambulatory Visit: Admit: 2020-09-09 | Discharge: 2020-09-09 | Payer: PRIVATE HEALTH INSURANCE

## 2020-09-09 DIAGNOSIS — K7460 Unspecified cirrhosis of liver: Principal | ICD-10-CM

## 2020-09-09 DIAGNOSIS — J439 Emphysema, unspecified: Principal | ICD-10-CM

## 2020-09-09 DIAGNOSIS — R9389 Abnormal findings on diagnostic imaging of other specified body structures: Principal | ICD-10-CM

## 2020-09-09 DIAGNOSIS — Z720 Tobacco use: Principal | ICD-10-CM

## 2020-09-09 DIAGNOSIS — K769 Liver disease, unspecified: Principal | ICD-10-CM

## 2020-09-09 MED ADMIN — gadobenate dimeglumine (MULTIHANCE) 529 mg/mL (0.1mmol/0.2mL) solution 6 mL: 6 mL | INTRAVENOUS | @ 14:00:00 | Stop: 2020-09-09

## 2020-09-11 ENCOUNTER — Ambulatory Visit: Admit: 2020-09-11 | Discharge: 2020-09-12 | Payer: PRIVATE HEALTH INSURANCE

## 2020-09-11 DIAGNOSIS — R310 Gross hematuria: Principal | ICD-10-CM

## 2020-09-11 DIAGNOSIS — K7460 Unspecified cirrhosis of liver: Principal | ICD-10-CM

## 2020-09-11 DIAGNOSIS — J984 Other disorders of lung: Principal | ICD-10-CM

## 2020-09-11 DIAGNOSIS — R911 Solitary pulmonary nodule: Principal | ICD-10-CM

## 2020-09-11 DIAGNOSIS — N200 Calculus of kidney: Principal | ICD-10-CM

## 2020-09-11 DIAGNOSIS — F17209 Nicotine dependence, unspecified, with unspecified nicotine-induced disorders: Principal | ICD-10-CM

## 2020-09-11 DIAGNOSIS — K769 Liver disease, unspecified: Principal | ICD-10-CM

## 2020-09-11 DIAGNOSIS — R300 Dysuria: Principal | ICD-10-CM

## 2020-09-11 DIAGNOSIS — R309 Painful micturition, unspecified: Principal | ICD-10-CM

## 2020-09-11 MED ADMIN — iohexoL (OMNIPAQUE) 350 mg iodine/mL solution 100 mL: 100 mL | INTRAVENOUS | @ 20:00:00 | Stop: 2020-09-11

## 2020-09-12 NOTE — Unmapped (Signed)
Called and spoke with him about results. Still having burning with urination.  No hematuria has recurred.  Otherwise feels well. We reviewed results of the CT scan, but stone identified is likey not causing issues. No penile pain or redness.  Discussed referral to urology and he is agreeable

## 2020-09-15 ENCOUNTER — Encounter
Admit: 2020-09-15 | Discharge: 2020-10-05 | Payer: PRIVATE HEALTH INSURANCE | Attending: Radiation Oncology | Primary: Radiation Oncology

## 2020-09-15 DIAGNOSIS — C61 Malignant neoplasm of prostate: Principal | ICD-10-CM

## 2020-09-15 LAB — PSA: PROSTATE SPECIFIC ANTIGEN: <0.04 ng/mL (ref 0.00–4.00)

## 2020-09-15 NOTE — Unmapped (Signed)
RADIATION ONCOLOGY FOLLOW-UP VISIT NOTE     Encounter Date: 09/15/2020  Patient Name: Harold Richards  Medical Record Number: [id number]    DIAGNOSIS:  65 y.o. male with high risk prostate cancer, cT1c, Gleason 4+4=8 in 2/12 cores, PSA 4.59 s/p external beam RT (45Gy) with brachytherapy boost (brachy done 11/05/2018)    DURATION SINCE COMPLETION OF RADIOTHERAPY:  1 year, 10 months (brachy on 11/05/2018)    Lupron #1 (3 month injection) 07/17/2018  Lupron #2 (3 month injection) 10/16/2018  Lupron #3 (3 month injection) 01/16/2019  Lupron #4 (1 month injection) 04/18/2019  Eligard #5 (3 month injection) 05/29/2019  Eligard #6 (3 month injection) 08/28/2019  Eligard #7 (3 month injection) 11/27/2019    ASSESSMENT:  Disease Status: No Evidence of Disease (NED).  PSA today  <0.04.    RECOMMENDATIONS:  PSA today is <0.04.     ADT:  Completed- last injection 11/27/2019- discussed that this shot lasts ~3 months so testosterone should begin recovering now but generally takes several months  FOLLOW-UP:  He will return in 3 months for routine follow-up and PSA check  GU: With hematuria and dysuria symptoms that are of recent onset, we are concerned about radiation cystitis vs bladder tumor. Will need to see urology for cystoscopy, his PCP has ordered the referral already. We will follow up on the referral in the next few weeks.  GI:  No issues- colonoscopy in 2017 with recommendation for next scope in 2022, discussed getting this scheduled    INTERVAL HISTORY:    He reports that in late January he had 2-3 days of hematuria with each episode of urination. The stream was bright red through out and the toilet water was blood colored. The bleeding self resolved. He had associated dysuria which has not improved. He is taking Flomax twice a day. His PCP ordered a Urogram that was negative for obstructing stones. Culture was negative for infection.    Otherwise he reports nocturia 2-3x, no change in daytime urination or stream.  Rare urgency, no incontinence.  Some occasional loose stools but nothing too concerning- no bleeding or pain.  He still has a few hot flashes per day, unchanged from prior- not overly bothersome and maybe slightly improved.     CT Urogram 09/11/20  Punctate nonobstructing right nephrolithiasis. Otherwise, no suspicious renal or urothelial lesions.     Additional chronic and incidental findings, as noted above.                      Baseline  General: Very fatigued. No ED  Urinary: On flomax, post-void residual 0 cc. Has frequency, nocturia 2-3x/night, no urgency, hematuria, dysuria.   GI: Denies loose stools, rectal pain, or bleeding.   Colonoscopy: Done 10/2015, found polyp.  Repeat in 5 years.    REVIEW OF SYSTEMS:  A comprehensive review of 10 systems was negative except for pertinent positives noted in HPI.    PAST MEDICAL HISTORY/FAMILY HISTORY/SOCIAL HISTORY:  Reviewed in EPIC    ALLERGIES/MEDICATIONS:  Reviewed in EPIC    PHYSICAL EXAM:  Vital Signs for this encounter:   BP 112/60  - Pulse 74  - Temp 36.8 ??C (98.2 ??F) (Temporal)  - Wt 57.8 kg (127 lb 8 oz)  - SpO2 98%  - BMI 20.58 kg/m??   Karnofsky/Lansky Performance Status: 90,  Able to carry on normal activity; minor signs or symptoms of disease (ECOG equivalent 0)  General:   No acute distress, alert and oriented  X 4   Head: Normocephalic, without obvious abnormality, atraumatic  Eyes: EOMI, no scleral icterus  Lungs: Normal work of breathing  Abdomen: non-distended  Extremities: extremities normal, atraumatic, no cyanosis or edema  Lymph nodes: No palpable supraclavicular or cervical lymphadenopathy  Neurologic: Grossly normal   Rectal: Deferred      RADIOLOGY:  No new imaging to review    Labs:    PSA   Date Value Ref Range Status   09/15/2020 <0.04 0.00 - 4.00 ng/mL Final   05/28/2020 <0.04 0.00 - 4.00 ng/mL Final   02/27/2020 <0.04 0.00 - 4.00 ng/mL Final   08/28/2019 <0.10 0.00 - 4.00 ng/mL Final   01/16/2019 <0.10 0.00 - 4.00 ng/mL Final         Winferd Humphrey, MD, PhD  Radiation Oncology PGY-4    I saw and evaluated/examined the patient, participating in the key portions of the service. I discussed the findings, assessment, and plan of care with the resident. I agree with the findings and plan as documented in the resident's note.    64 y.o. male with high risk prostate cancer, cT1c, Gleason 4+4=8 in 2/12 cores, PSA 4.59 s/p external beam RT (45Gy) with brachytherapy boost (brachy done 11/05/2018).  He is now nearly 2 years out from finishing RT and doing well with no evidence of disease recurrence.  He has developed hematuria and a few other urinary issues- discussed further evaluation with urology and likely cystoscopy.  Considerations could be RT cystitis or bladder malignancy. We will help coordinate this.    Rayetta Humphrey, MD  Assistant Professor  [facility] Dept of Radiation Oncology  09/15/2020

## 2020-09-15 NOTE — Unmapped (Signed)
Started having burning with urination a week ago. The PCP did a scan.

## 2020-09-22 ENCOUNTER — Ambulatory Visit: Admit: 2020-09-22 | Discharge: 2020-09-23 | Payer: PRIVATE HEALTH INSURANCE | Attending: Family | Primary: Family

## 2020-09-22 DIAGNOSIS — K7460 Unspecified cirrhosis of liver: Principal | ICD-10-CM

## 2020-09-22 DIAGNOSIS — R310 Gross hematuria: Principal | ICD-10-CM

## 2020-09-22 DIAGNOSIS — F5105 Insomnia due to other mental disorder: Principal | ICD-10-CM

## 2020-09-22 DIAGNOSIS — F4321 Adjustment disorder with depressed mood: Principal | ICD-10-CM

## 2020-09-22 LAB — URINALYSIS
BILIRUBIN UA: NEGATIVE
BLOOD UA: NEGATIVE
GLUCOSE UA: NEGATIVE
KETONES UA: NEGATIVE
LEUKOCYTE ESTERASE UA: NEGATIVE
NITRITE UA: NEGATIVE
PH UA: 5 (ref 5.0–9.0)
PROTEIN UA: NEGATIVE
RBC UA: <1 /HPF (ref <3)
SPECIFIC GRAVITY UA: >=1.03 (ref 1.005–1.030)
SQUAMOUS EPITHELIAL: <1 /HPF (ref 0–5)
UROBILINOGEN UA: 0.2
WBC UA: 7 /HPF — ABNORMAL HIGH (ref <2)

## 2020-09-22 LAB — CBC W/ AUTO DIFF
BASOPHILS ABSOLUTE COUNT: 0.1 10*9/L (ref 0.0–0.1)
BASOPHILS RELATIVE PERCENT: 1.4 %
EOSINOPHILS ABSOLUTE COUNT: 0.1 10*9/L (ref 0.0–0.7)
EOSINOPHILS RELATIVE PERCENT: 1.1 %
HEMATOCRIT: 37.1 % — ABNORMAL LOW (ref 38.0–50.0)
HEMOGLOBIN: 13.2 g/dL — ABNORMAL LOW (ref 13.5–17.5)
LYMPHOCYTES ABSOLUTE COUNT: 1.2 10*9/L (ref 0.7–4.0)
LYMPHOCYTES RELATIVE PERCENT: 18.3 %
MEAN CORPUSCULAR HEMOGLOBIN CONC: 35.5 g/dL (ref 30.0–36.0)
MEAN CORPUSCULAR HEMOGLOBIN: 35.4 pg — ABNORMAL HIGH (ref 26.0–34.0)
MEAN CORPUSCULAR VOLUME: 99.8 fL — ABNORMAL HIGH (ref 81.0–95.0)
MEAN PLATELET VOLUME: 6.8 fL — ABNORMAL LOW (ref 7.0–10.0)
MONOCYTES ABSOLUTE COUNT: 0.5 10*9/L (ref 0.1–1.0)
MONOCYTES RELATIVE PERCENT: 7.1 %
NEUTROPHILS ABSOLUTE COUNT: 4.9 10*9/L (ref 1.7–7.7)
NEUTROPHILS RELATIVE PERCENT: 72.1 %
PLATELET COUNT: 199 10*9/L (ref 150–450)
RED BLOOD CELL COUNT: 3.71 10*12/L — ABNORMAL LOW (ref 4.32–5.72)
RED CELL DISTRIBUTION WIDTH: 12.9 % (ref 12.0–15.0)
WBC ADJUSTED: 6.7 10*9/L (ref 3.5–10.5)

## 2020-09-22 LAB — BASIC METABOLIC PANEL
ANION GAP: 3 mmol/L — ABNORMAL LOW (ref 5–14)
BLOOD UREA NITROGEN: 18 mg/dL (ref 9–23)
BUN / CREAT RATIO: 20
CALCIUM: 9.9 mg/dL (ref 8.7–10.4)
CHLORIDE: 115 mmol/L — ABNORMAL HIGH (ref 98–107)
CO2: 24.3 mmol/L (ref 20.0–31.0)
CREATININE: 0.88 mg/dL
EGFR CKD-EPI AA MALE: >90 mL/min/{1.73_m2} (ref >=60)
EGFR CKD-EPI NON-AA MALE: >90 mL/min/{1.73_m2} (ref >=60)
GLUCOSE RANDOM: 95 mg/dL (ref 70–179)
POTASSIUM: 4.2 mmol/L (ref 3.4–4.5)
SODIUM: 142 mmol/L (ref 135–145)

## 2020-09-22 LAB — HEPATIC FUNCTION PANEL
ALBUMIN: 3.9 g/dL (ref 3.4–5.0)
ALKALINE PHOSPHATASE: 54 U/L (ref 46–116)
ALT (SGPT): 16 U/L (ref 10–49)
AST (SGOT): 24 U/L (ref <=34)
BILIRUBIN DIRECT: 0.1 mg/dL (ref 0.00–0.30)
BILIRUBIN TOTAL: 0.3 mg/dL (ref 0.3–1.2)
PROTEIN TOTAL: 7 g/dL (ref 5.7–8.2)

## 2020-09-22 LAB — PROTIME-INR
INR: 1.14
PROTIME: 13.3 s (ref 10.3–13.4)

## 2020-09-22 LAB — AFP TUMOR MARKER: AFP-TUMOR MARKER: 3 ng/mL (ref <=8)

## 2020-09-22 MED ORDER — MIRTAZAPINE 7.5 MG TABLET
ORAL_TABLET | Freq: Every evening | ORAL | 6 refills | 30 days | Status: CP
Start: 2020-09-22 — End: ?

## 2020-09-22 NOTE — Unmapped (Addendum)
1.  Laboratory studies ordered today. Will be in touch with results.   2.  MRI of abdomen and CT scan of chest ordered for six months.   3.  Office follow up three months.   4.  Remeron 7.5 mg one tablet nightly to help with sleeping. Prescription sent to pharmacy.    5.  Need to increase dietary habits to healthier as discussed.   6.  Any questions please let me know.   7.  Follow up with urology as scheduled, but call an see if able to be seen sooner.

## 2020-09-22 NOTE — Unmapped (Signed)
[facility] Ph [phone] Fax [phone]  Woodfin Ganja, M.D.  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 HCV treatment  ~ 2016    Presentation of Current Illness:    Harold [facility] is a 65 y.o. pleasant Caucasian gentleman who presents today for follow up care. 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 Cured of HCV dating back to 2016 (Harvoni x 12 weeks). He was treatment naive with genotype 1a. History of recreational drug use (cocaine IV, intranasal and crack), three tattoos with one being homemade, and incarceration. Fibroscan 20 kPa consistent with F4 (cirrhosis). PMH of prostate cancer (high??risk prostate cancer, cT1c, Gleason??4+4=8??in 2/12??cores, PSA 4.59 s/p external beam RT (45Gy) with brachytherapy boost (brachy done 11/05/2018), OSA with overlap COPD, basal cell carcinoma, chronic fungal infection of maxillary sinus, pulmonary nodules, nicotine dependency, chronic headaches, and renal calculus.     Interim History:  He was seen by Dr. Winferd Humphrey on 09/15/2020 for the concern of gross hematuria. Patient to proceed with further urological evaluation inclusive of cystoscopy. Concern for possible radiation cystitis versus bladder tumor. Patient states hematuria has resolved at his time. Urological evaluation scheduled for April.     During his last visit, his wife expressed concern with continued mental status changes (confusion, poor memory recall, and forgetfulness) despite taking lactulose 15 ml bid and Xifaxan 550 mg bid. We agreed to proceed forward with neurological evaluation. Scheduled to see Dr. De Hollingshead on 10/13/2020. His wife was not present during his last visit given her recent diagnosis of breast cancer. She will complete oral chemotherapy in May. He retired summer of 2021, which he hoped would improve his mental state and health overall would improvement without work stressors being present.     He had a colonoscopy 10/20/15 showing 1 small adenoma- 5 yr follow-up. EGD 08/2017 essentially unremarkable. Denies any jaundice, pruritis, SOB, CP, increased abdominal girth, LE edema, melena, BRBPR, confusion, vomiting, or diarrhea. Adherence with CPAP use. Noted one lb weight gain since September. Continued complaint of fatigue.     Finding of two 3 mm pulmonary nodules on CT scan lung for lung cancer screening (07/11/2016) in the setting of history of tobacco abuse. CT scan of chest performed 07/21/2017 stable unchanged subcentimeter pulmonary nodules. CT scan of chest 03/09/2020: Right upper lobe nodular pulmonary parenchymal opacity, unchanged. CT chest 09/09/2020 - The nodular lesion with surrounding architectural distortion in the upper lobe of the right lung is minimally different relative to imaging dating back to September 11, 2019. However, it is new relative to more remote prior imaging. The finding may simply reflects nodular scarring, however, given the patient's history of smoking it does warrant close imaging surveillance. Surveillance recommended six months.    Echocardiogram 11/17/2017: Performed for indication of bradycardia.     Interpretation Summary     ?? Normal left ventricular systolic function, ejection fraction 60 to 65%  ?? Dilated left atrium - mild  ?? Normal right ventricular systolic function  ?? Tricuspid regurgitation - mild  ?? Mildly elevated right atrial pressure     External ECG-48 to 21 day findings:  Conclusion: ??1 run of non-sustained Ventricular Tachycardia occurred lasting 6 beats with a max rate of 112 bpm (avg 93 bpm). ??37 Supraventricular Tachycardia runs occurred, most consistent with  paroxysmal atrial tachycardia; the run with the fastest interval lasting 13 beats with a max rate of 207 bpm, the longest lasting 8 beats with an avg rate of 104 bpm.    NM Myocardial perfusion spect multiple: 11/2018:   Normal myocardial perfusion study. No evidence for significant ischemia or scar is noted. Post stress: Global systolic function is hyperdynamic. The ejection fraction was greater than 65%. Coronary calcifications are noted. Mild bibasilar dependent atelectasis    Cirrhosis Care:  -- Varices screen: normal platelets and spleen size with Fibroscan < 25 kPa. No history of EGD.  -- HCC screen:   MRI of abdomen 09/10/2019: Mild left hepatic lobe hypertrophy, similar prior.The following focal hepatic lesions are identified:  -1.5 cm segment 4 arterially enhancing lesion which demonstrates isointensity on delayed imaging, similar prior (12:22). LR-2.   -1 cm segment 3 lesion with peripheral enhancement, similar prior (12:17). There is no evidence of washout or delayed capsular enhancement. LR-2.  -Several foci of arterial enhancement throughout the right liver measuring up to 0.9 cm, slightly decreased in size from prior (12:47). There is no washout delayed capsular enhancement. LR-2. Spleen unremarkable. No ascites.     -- Immunity hepatitis B.   -- Immunity hepatitis A: Series completed.   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  -- OLT status: Not indicated at this time based on low MELD score  -- MELD score: 7, Child-Pugh: 5 , Class: A   MELD-Na score: 7 at 09/22/2020  9:01 AM  MELD score: 7 at 09/22/2020  9:01 AM  Calculated from:  Serum Creatinine: 0.88 mg/dL (Using min of 1 mg/dL) at 1/61/0960  4:54 AM  Serum Sodium: 142 mmol/L (Using max of 137 mmol/L) at 09/22/2020  9:01 AM  Total Bilirubin: 0.3 mg/dL (Using min of 1 mg/dL) at 0/98/1191  4:78 AM  INR(ratio): 1.14 at 09/22/2020  9:01 AM  Age: 13 years     Stable score of 7     -- Fibroscan 06/16/2014: 20.0 kPa/F4  --Chronic hepatitis C, genotype 1a  Pre-treatment HCV RNA:  14474 IU/mL  HCV RNA 08/23/2014 - TW #6/12 - non-detectable  HCV RNA 09/24/2014 - EOT- non-detectable  HCV RNA SVR 01/01/2015 - -not detected  HCV RNA 01/26/2017 - not detected    ROS:  All systems reviewed and negative except in HPI.     Allergies:   Allergies   Allergen Reactions   ??? Bicalutamide Rash   ??? Penicillins Rash   ??? Rocephin [Ceftriaxone] Rash     Patient received in Emergency Room and reported rash to stomach and back several hours later.      Medications:  Current Outpatient Medications   Medication Sig Dispense Refill   ??? albuterol HFA 90 mcg/actuation inhaler Inhale 2 puffs every four (4) hours as needed for wheezing. 8 g 3   ??? aspirin (ECOTRIN) 81 MG tablet Take 81 mg by mouth daily.     ??? atorvastatin (LIPITOR) 20 MG tablet Take 1 tablet (20 mg total) by mouth nightly. 90 tablet 3   ??? azithromycin (ZITHROMAX) 500 MG tablet Take 1 tablet (500 mg total) by mouth daily. For increased shortness of breath, increased cough, or change in sputum. 15 tablet 0   ??? cholecalciferol, vitamin D3, (VITAMIN D3) 1,000 unit capsule Take 1,000 Units by mouth daily.     ??? cyanocobalamin (VITAMIN B-12) 250 MCG tablet Take 250 mcg by mouth daily.     ??? ferrous sulfate 325 mg (65 mg iron) CpER  Take 1 tablet by mouth daily.      ??? fluticasone-umeclidin-vilanter (TRELEGY ELLIPTA) 100-62.5-25 mcg inhaler Inhale 1 puff daily. 90 each 3   ??? guaiFENesin (MUCINEX) 600 mg 12 hr tablet Take 600 mg by mouth every morning.     ??? lactulose (CHRONULAC) 10 gram/15 mL solution Take 15 mL (10 g total) by mouth Two (2) times a day. 240 mL 0   ??? loratadine (CLARITIN) 10 mg tablet Take 10 mg by mouth daily.     ??? multivitamin (TAB-A-VITE/THERAGRAN) per tablet Take 1 tablet by mouth daily.     ??? predniSONE (DELTASONE) 20 MG tablet Take 2 tablets (40 mg total) by mouth daily. For 5 days. For increased shortness of breath, increased cough, or change in sputum. 30 tablet 0   ??? rifAXIMin (XIFAXAN) 550 mg Tab Take 1 tablet (550 mg total) by mouth Two (2) times a day. 60 tablet 6   ??? sertraline (ZOLOFT) 25 MG tablet      ??? SUBOXONE 8-2 mg sublingual film Place 1 Film under the tongue Two (2) times a day. 0   ??? tamsulosin (FLOMAX) 0.4 mg capsule TAKE 1 CAPSULE(0.4 MG) BY MOUTH TWICE DAILY 180 capsule 1   ??? vit C,E-Zn-coppr-lutein-zeaxan (PRESERVISION AREDS 2) 250-200-40-1 mg-unit-mg-mg cap Take 1 tablet by mouth Two (2) times a day.       No current facility-administered medications for this visit.     Active Ambulatory Problems     Diagnosis Date Noted   ??? Chronic headaches    ??? Renal calculus    ??? Tobacco use disorder 02/08/2013   ??? Nasal septum perforation 02/12/2013   ??? Insomnia 02/13/2013   ??? Other emphysema (CMS-HCC)    ??? Hepatitis C 03/05/2014   ??? 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 Proteinuria 09/28/2017   ??? Prostate cancer (CMS-HCC) 09/11/2018   ??? Cirrhosis (CMS-HCC) 03/14/2019   ??? Community acquired pneumonia of right upper lobe of lung 04/07/2019   ??? Sinusitis 06/24/2019   ??? 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 Opioid use disorder, mild, in sustained remission, on maintenance therapy (CMS-HCC) 02/25/2020     Resolved Ambulatory Problems     Diagnosis Date Noted   ??? Chest pain 02/07/2013   ??? GERD (gastroesophageal reflux disease) 02/08/2013   ??? Skin lesion 02/12/2013   ??? Sialolithiasis of submandibular gland 04/08/2015   ??? Rib pain on left side 06/16/2016   ??? Health care maintenance 06/16/2016   ??? Nonspecific L Lung micronodules, repeat LDCT due 07/12/2017 07/12/2016   ??? Atherosclerosis of coronary artery 08/16/2016   ??? Actinic keratosis 02/01/2017   ??? Elevated PSA 09/28/2017   ??? Benign localized hyperplasia of prostate with urinary obstruction 09/28/2017   ??? Abnormal prostate exam 09/28/2017   ??? Hepatic encephalopathy (CMS-HCC) 03/14/2019     Past Medical History:   Diagnosis Date   ??? Asthma    ??? Basal cell carcinoma    ??? Colon polyp    ??? Enlarged prostate    ??? Hx of substance abuse (CMS-HCC)    ??? Infectious viral hepatitis    ??? Osteoporosis    ??? Skin cancer    ??? Tobacco abuse 02/08/2013     Past Surgical History:   Procedure Laterality Date   ??? CERVICAL FUSION      cage   ??? PR COLSC FLX W/RMVL OF TUMOR POLYP LESION SNARE TQ N/A 10/20/2015    Procedure: COLONOSCOPY FLEX; W/REMOV TUMOR/LES BY SNARE;  Surgeon: Annie Paras, MD;  [facility];  Service: Gastroenterology   ??? PR EXCISION SUBMAXILLARY GLAND Right 03/31/2015    Procedure: EXC SUBMANDIBULAR GLAND;  Surgeon: Lauralee Evener, MD;  [facility];  Service: ENT   ??? PR REMV UPPER JAW-MAXILLECTOMY Left 07/22/2019    Procedure: MAXILLECTOMY; WO ORBITAL EXENTERATION;  Surgeon: Adam Swaziland Kimple, MD;  Location: ASC [facility];  Service: ENT   ??? PR STEREOTACTIC COMP ASSIST PROC,CRANIAL,EXTRADURAL Left 07/22/2019    Procedure: STEREOTACTIC COMPUTER-ASSISTED (NAVIGATIONAL) PROCEDURE; CRANIAL, EXTRADURAL;  Surgeon: Adam Swaziland Kimple, MD;  Location: [facility];  Service: ENT   ??? PR UPPER GI ENDOSCOPY,DIAGNOSIS N/A 08/18/2017    Procedure: UGI ENDO, INCLUDE ESOPHAGUS, STOMACH, & DUODENUM &/OR JEJUNUM; DX W/WO COLLECTION SPECIMN, BY BRUSH OR WASH;  Surgeon: Rona Ravens, MD;  [facility];  Service: Gastroenterology   ??? PR UPPER GI ENDOSCOPY,DIAGNOSIS N/A 11/08/2019    Procedure: UGI ENDO, INCLUDE ESOPHAGUS, STOMACH, & DUODENUM &/OR JEJUNUM; DX W/WO COLLECTION SPECIMN, BY BRUSH OR WASH;  Surgeon: Janyth Pupa, MD;  [facility]: [facility];  Service: Gastroenterology   ??? SKIN BIOPSY       Family History   Problem Relation Age of Onset   ??? Heart attack Father    ??? Hypertension Father    ??? Lung cancer Mother    ??? Bone cancer Mother    ??? Hypertension Mother    ??? Hepatitis Sister    ??? Melanoma Neg Hx       Social History     Socioeconomic History   ??? Marital status: Married     Spouse name: Not on file   ??? Number of children: Not on file   ??? Years of education: Not on file   ??? Highest education level: Not on file   Occupational History   ??? Occupation: retired   Tobacco Use   ??? Smoking status: Light Tobacco Smoker     Packs/day: 0.25     Years: 50.00     Pack years: 12.50 Types: Cigarettes     Start date: 06/16/1969   ??? Smokeless tobacco: Former Neurosurgeon     Types: Chew     Quit date: 2009   ??? Tobacco comment: PATIENT CURRENTLY USING PATCH TO HELP WITH QUITTING   Substance and Sexual Activity   ??? Alcohol use: No     Alcohol/week: 0.0 standard drinks   ??? Drug use: Not Currently     Types: Cocaine, IV     Comment: History of cocaine use years ago (intranasal, IV and crack), marijuana use.    ??? Sexual activity: Yes     Partners: Female   Other Topics Concern   ??? Do you use sunscreen? No   ??? Tanning bed use? No   ??? Are you easily burned? No   ??? Excessive sun exposure? Yes   ??? Blistering sunburns? Yes   Social History Narrative   ??? Not on file     Social Determinants of Health     Financial Resource Strain: Not on file   Food Insecurity: Not on file   Transportation Needs: Not on file   Physical Activity: Not on file   Stress: Not on file   Social Connections: Not on file     Physical Examination:   BP 138/75  - Pulse 72  - Temp 37.1 ??C (98.7 ??F)  - Wt 55.8 kg (123 lb)  -  SpO2 98%  - BMI 19.85 kg/m??   General appearance - Alert, no distress, oriented to person, place, and time. Evidence of temporal wasting. Slender. Very pleasant.   Mental status - Normal mood, behavior, speech, dress, motor activity, and thought processes.   Eyes - pupils equal and reactive, sclera anicteric  Neck - Supple, no JVD  Chest - clear to auscultation, no wheezes, rales or rhonchi, symmetric air entry  Heart - normal rate, regular rhythm, normal S1, S2, no murmurs, rubs, clicks or gallops  Abdomen - soft, nontender, nondistended, no masses or organomegaly  Neurological - screening mental status exam normal. No evidence of asterixis  Skin - normal coloration. No evidence of palmar erythema.      Laboratory Studies:  Results for orders placed or performed in visit on 09/22/20   AFP non-maternal tumor marker   Result Value Ref Range    AFP-Tumor Marker 3 <=8 ng/mL   PT-INR   Result Value Ref Range    PT 13.3 10.3 - 13.4 sec    INR 1.14    Hepatic Function Panel   Result Value Ref Range    Albumin 3.9 3.4 - 5.0 g/dL    Total Protein 7.0 5.7 - 8.2 g/dL    Total Bilirubin 0.3 0.3 - 1.2 mg/dL    Bilirubin, Direct 1.61 0.00 - 0.30 mg/dL    AST 24 <=09 U/L    ALT 16 10 - 49 U/L    Alkaline Phosphatase 54 46 - 116 U/L   Basic metabolic panel   Result Value Ref Range    Sodium 142 135 - 145 mmol/L    Potassium 4.2 3.4 - 4.5 mmol/L    Chloride 115 (H) 98 - 107 mmol/L    CO2 24.3 20.0 - 31.0 mmol/L    Anion Gap 3 (L) 5 - 14 mmol/L    BUN 18 9 - 23 mg/dL    Creatinine 6.04 5.40 - 1.10 mg/dL    BUN/Creatinine Ratio 20     EGFR CKD-EPI Non-African American, Male >90 >=60 mL/min/1.20m2    EGFR CKD-EPI African American, Male >90 >=60 mL/min/1.4m2    Glucose 95 70 - 179 mg/dL    Calcium 9.9 8.7 - 98.1 mg/dL   Urinalysis   Result Value Ref Range    Color, UA Yellow     Clarity, UA Clear     Specific Gravity, UA >=1.030 1.005 - 1.030    pH, UA 5.0 5.0 - 9.0    Leukocyte Esterase, UA Negative Negative    Nitrite, UA Negative Negative    Protein, UA Negative Negative    Glucose, UA Negative Negative    Ketones, UA Negative Negative    Urobilinogen, UA 0.2 mg/dL 0.2 - 2.0 mg/dL    Bilirubin, UA Negative Negative    Blood, UA Negative Negative    RBC, UA <1 <3 /HPF    WBC, UA 7 (H) <2 /HPF    Squam Epithel, UA <1 0 - 5 /HPF    Bacteria, UA Rare (A) None Seen /HPF    Mucus, UA Many (A) None Seen /HPF   CBC w/ Differential   Result Value Ref Range    WBC 6.7 3.5 - 10.5 10*9/L    RBC 3.71 (L) 4.32 - 5.72 10*12/L    HGB 13.2 (L) 13.5 - 17.5 g/dL    HCT 19.1 (L) 47.8 - 50.0 %    MCV 99.8 (H) 81.0 - 95.0 fL    MCH 35.4 (H)  26.0 - 34.0 pg    MCHC 35.5 30.0 - 36.0 g/dL    RDW 29.5 62.1 - 30.8 %    MPV 6.8 (L) 7.0 - 10.0 fL    Platelet 199 150 - 450 10*9/L    Neutrophils % 72.1 %    Lymphocytes % 18.3 %    Monocytes % 7.1 %    Eosinophils % 1.1 %    Basophils % 1.4 %    Absolute Neutrophils 4.9 1.7 - 7.7 10*9/L    Absolute Lymphocytes 1.2 0.7 - 4.0 10*9/L Absolute Monocytes 0.5 0.1 - 1.0 10*9/L    Absolute Eosinophils 0.1 0.0 - 0.7 10*9/L    Absolute Basophils 0.1 0.0 - 0.1 10*9/L     Assessment/Plan:   [facility] is a 65 y.o. Caucasian male with decompensated HCV cirrhosis: PMH of cirrhosis with HE which appears fairly well controlled with total 30 ml of lactulose daily and Xifaxan 550 mg bid. His two biggest complaints which remain are fatigue and memory recall disturbance. We hoped the use of CPAP would improve fatigue and possible improve memory recall, but remains difficult at times. He has experienced difficulty with being able to recall family members names. There is no history of an overt decompensated event such as upper GI bleed or ascites. Fibroscan is consistent with F4 disease. Continued evidence of good synthetic liver function. Stable MELD score of 7.      ~ MELD labs, AFP tumor marker ordered  ~ Office follow up middle of February 2022  ~ Avoid alcohol use.     HE: Continue lactulose 15 ml bid and Xifaxan 550 mg bid. Goal of two to three bowel movements daily.    HCC screening ~Up to date. History of LRAD-2 and LRAD- 3 lesions in the setting of cirrhosis. Warrants surveillance every six months. Next MRI of abdomen already scheduled for September 09, 2020.    Osteoporosis evaluation ~Under the care of endocrinology at [facility].     Tobacco abuse, continued ~ Smoking cessation discussed again.     Pulmonary nodules ~ Pulmonary nodule noted CT scan chest 03/2020 with radiology recommendation for surveillance six months. Patient already scheduled.     Skin cancer ~ Under the care of dermatology.     Nutritional status ~ Continue with high caloric diet with continuation of at least two cans of Boost daily. Congratulated on successful weight gain.     Variceal Screening~ No history of varices. Repeat upper endoscopy not necessarily recommended if remains well compensated given PLT count >150 and fibroscan <25 kPa.    History of Bradycardia with evidence of VTach as noted above ~Under the care of cardiology.     History of prostate cancer ~ Under the care of oncology. Treatment inclusive of Lupron injections and radiation treatment.     Gross Hematuria: Referred to urology and pending scheduling of cystoscopy.     Memory Disturbance: Encouraged to remain scheduled for upcoming appointment.     All patient's questions were answered to their satisfaction during visit today.     All patient's and his wife's questions were answered to their satisfaction during visit today.     Rodman Key, DNP, FNP-BC  [facility]  [phone]  [place] [address]  Phone [phone]

## 2020-09-23 ENCOUNTER — Encounter
Admit: 2020-09-23 | Discharge: 2020-09-24 | Payer: PRIVATE HEALTH INSURANCE | Attending: MOHS-Micrographic Surgery | Primary: MOHS-Micrographic Surgery

## 2020-09-23 MED ORDER — HYDROCODONE 5 MG-ACETAMINOPHEN 325 MG TABLET
ORAL_TABLET | Freq: Four times a day (QID) | ORAL | 0 refills | 3.00000 days | Status: CP | PRN
Start: 2020-09-23 — End: ?

## 2020-09-23 NOTE — Unmapped (Signed)
Consult from:    Rory Percy, MD     Reason for visit/CC:   1) Basal cell carcinoma, left preauricular  2) Basal cell carcinoma, left cheek  3) Basal cell carcinoma, left nose    HPI: Pt is seen in consultation at the request of the above provider for evaluation and treatment options, including Mohs micrographic surgery, for the above tumors.  These lesions have not been treated in the past. They have been present for months and caused symptoms such as discoloration, texture change, but no pain, bleeding, ulceration, drainage, tingling, or other symptoms.  No other significant lesions of concern.      Medications and allergies: see patient chart.    Review of systems: Reviewed 12 systems and notable for skin cancer as above and COPD.  All other systems reviewed are unremarkable/negative, unless noted in the HPI. Past medical history, surgical history, family history, social history were also reviewed and are noted in the chart/questionnaire.      PE: Well-developed, well-nourished, alert and oriented x 4. Vitals reviewed in chart (if available).  Exam reveals a 1.5 cm ill-defined erythematous patch and biopsy scar on the left preauricular cheek, a 0.5 cm erythematous patch and biopsy scar on the left medial cheek, and a 0.6 cm ill-defined erythematous patch and biopsy scar on the left side of nose.        Assessment:     1) Basal cell carcinoma, left preauricular  2) Basal cell carcinoma, left cheek  3) Basal cell carcinoma, left nose  4) photodamage  5) solar lentigenes  6) hx of NMSC    Plan:   1. Due to size, location, pathology and ill-defined margins and the likelihood of subclinical extension as well as the need to conserve normal surrounding tissue, the patient and tumors were deemed appropriate for Mohs micrographic surgery (MMS).  The nature and purpose of the procedure, associated benefits and risks including recurrence and scarring, possible complications such as pain, infection, and bleeding, and alternative methods of treatment if appropriate were discussed with the patient during the consent. The diagnosis was verified by reviewing the pathology/report. The lesion location was verified by the patient, by reviewing previous notes, pathology reports, and by photographs as well as angulation measurements if available.  Informed consent was reviewed and signed by the patient, and timeout was performed at 0847.  See op note below. (Note that per the definition of Mohs micrographic surgery, the Mohs surgeon listed below acted as both the surgeon and pathologist for evaluation of the Mohs sections)  2. For the photodamage and solar lentigenes, sun protection discussed/information given on OTC sunscreens, and we recommend continued regular follow-up with primary dermatologist every 6 months or sooner for any growing, bleeding, or changing lesions.      Accession: [id number]   MOHS MICROGRAPHIC SURGERY - left preauricular cheek  Staff surgeon:                              Coralie Carpen, MD  Assistants:                                   Rolla Flatten  Clinical diagnosis:                        As above  Tumor location:  As above  Tumor size:                                  As above  Anesthesia:                                  1% lidocaine with epinephrine  Total volume of anesthesia:         <10ccs  Preparation:                                 2% chlorhexidine, 70% isopropyl alcohol/Chlora-prep  Surgical defect/wound size:         3.0x2.6 cm  Postoperative dx:                         Same  Preoperative medications:            None  Perioperative medications:           None  Postoperative medications:          None  EBL:                                             <5ccs  Complications:                             None     Stages:  Stage 1: The patient was positioned, marked, prepped, and anesthetized with local anesthesia as above.  The tumor was first debulked and then excised with an approx. 2mm margin.  Hemostasis was achieved with electrocautery as needed.  The specimen was then oriented, subdivided/relaxed, inked, and processed using Mohs technique.  Evaluation of slides by the Mohs surgeon revealed residual tumor at the margins.  Pathology: Groups of atypical basaloid cells extending from the epidermis into the dermis          Stage 2: An additional 2mm margin was excised.  Hemostasis was achieved with electrocautery as needed.  The specimen was then oriented, subdivided/relaxed, inked, and processed using Mohs technique.  Evaluation of the slides by the Mohs surgeon revealed residual tumor at the margins.  Pathology: same as above with an additional focus of squamous cell carcinoma          Stage 3: An additional 2mm margin was excised.  Hemostasis was achieved with electrocautery as needed.  The specimen was then oriented, subdivided/relaxed, inked, and processed using Mohs technique.  Evaluation of the slides by the Mohs surgeon revealed residual tumor at the margins.  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  Hemostasis was achieved with electrocautery as needed.  The specimen was then oriented, subdivided/relaxed, inked, and processed using Mohs technique.  Evaluation of slides by the Mohs surgeon revealed clear tumor margins.     RECONSTRUCTION:   V-Y advancement flap   Operation:   Adjacent tissue transfer to repair the MMS defect as above  Indication:  Functional and aesthetic reconstruction of the above wound  Anesthesia:  As above  Total volume of anesthesia: <10ccs  Preparation:  As above  SQ suture:  5-0 vicryl     Cutaneous suture:  6-0 gut  Flap length and width:  6.1 x 2.5 cm  Flap surface area:  15.25 cm2  Postoperative medications: None  EBL:  <5ccs  Complications:  None  Due to the size, depth, and location, complete primary linear closure could not be obtained without significant tension and distortion of adjacent tissue and anatomic structures.  We discussed repair options at length including graft, flap, and second intention. We chose to proceed with an advancement flap as noted above and this was marked, prepped, and anesthetized. First, the superior portion of the wound was closed in a primary linear fashion to decrease the overall remaining wound size. Then, the flap was incised and undermined.  Hemostasis was achieved with electrocautery.  The flap was trimmed appropriately and positioned superiorly into the defect.  Subcutaneous and epidermal tissues were approximated with the above sutures.       MOHS MICROGRAPHIC SURGERY - left medial cheek  Staff surgeon:                              As above  Assistants:                                   As above  Clinical diagnosis:                        As above  Tumor location:                            As above  Tumor size:                                  As above  Anesthesia:                                  1% lidocaine with epinephrine  Total volume of anesthesia:         <10ccs  Preparation:                                 2% chlorhexidine, 70% isopropyl alcohol/Chlora-prep  Surgical defect/wound size:         1.2x1.0 cm  Postoperative dx:                         Same  Preoperative medications:            None  Perioperative medications:           None  Postoperative medications:          None  EBL:                                             <5ccs  Complications:  None     Stages:  Stage 1: The patient was positioned, marked, prepped, and anesthetized with local anesthesia as above.  The tumor was first debulked and then excised with an approx. 2mm margin.  Hemostasis was achieved with electrocautery as needed.  The specimen was then oriented, subdivided/relaxed, inked, and processed using Mohs technique.  Evaluation of slides by the Mohs surgeon revealed residual tumor at the margins. Pathology: Groups of atypical basaloid cells extending from the epidermis into the dermis       Stage 2: An additional 2mm margin was excised.  Hemostasis was achieved with electrocautery as needed.  The specimen was then oriented, subdivided/relaxed, inked, and processed using Mohs technique.  Evaluation of slides by the Mohs surgeon revealed clear tumor margins.     RECONSTRUCTION:  Primary linear closure  Operation:    Complex linear closure of the above defect  Anesthesia:    As above  Total volume of anesthesia:  <10ccs  Preparation:    As above  SQ suture:    5-0 vicryl   Cutaneous suture:   6-0 gut  Closed wound length:  3.1 cm  Postoperative medications:  None  EBL:     <5ccs  Complications:   None  The surgical wound was then cleaned, prepped, and re-anesthetized as above. Wound edges were undermined extensively along at least one entire edge and at a distance equal to or greater than the width of the defect (see wound defect size above) in order to achieve closure and decrease wound tension and anatomic distortion. Redundant tissue repair including standing cone removal was performed. Hemostasis was achieved with electrocautery. Subcutaneous and epidermal tissues were approximated with the above sutures.        MOHS MICROGRAPHIC SURGERY - left nose  Staff surgeon:                              As above  Assistants:                                   As above  Clinical diagnosis:                        As above  Tumor location:                            As above  Tumor size:                                  As above  Anesthesia:                                  1% lidocaine with epinephrine  Total volume of anesthesia:         <10ccs  Preparation:                                 2% chlorhexidine, 70% isopropyl alcohol/Chlora-prep  Surgical defect/wound size:         1.2x1.1 cm  Postoperative dx:  Same  Preoperative medications:            None  Perioperative medications: None  Postoperative medications:          None  EBL:                                             <5ccs  Complications:                             None     Stages:  Stage 1: The patient was positioned, marked, prepped, and anesthetized with local anesthesia as above.  The tumor was first debulked and then excised with an approx. 2mm margin.  Hemostasis was achieved with electrocautery as needed.  The specimen was then oriented, subdivided/relaxed, inked, and processed using Mohs technique.  Evaluation of slides by the Mohs surgeon revealed residual tumor at the margins.  Pathology: Groups of atypical basaloid cells extending from the epidermis into the dermis         Stage 2: An additional 2mm margin was excised.  Hemostasis was achieved with electrocautery as needed.  The specimen was then oriented, subdivided/relaxed, inked, and processed using Mohs technique.  Evaluation of the slides by the Mohs surgeon revealed residual tumor at the margins.  Pathology: same as above           Stage 3: An additional 2mm margin was excised.  Hemostasis was achieved with electrocautery as needed.  The specimen was then oriented, subdivided/relaxed, inked, and processed using Mohs technique.  Evaluation of slides by the Mohs surgeon revealed clear tumor margins.     RECONSTRUCTION:   V-Y advancement flap  Operation:   Adjacent tissue transfer to repair the MMS defect as above  Indication:  Functional and aesthetic reconstruction of the above wound  Anesthesia:  As above  Total volume of anesthesia: <10ccs  Preparation:  As above  SQ suture:  5-0 vicryl     Cutaneous suture:  6-0 gut  Flap length and width:  1.7 x 2.8 cm  Flap surface area:  2.55 cm2  Postoperative medications: None  EBL:  <5ccs  Complications:  None  Due to the size, depth, and location, primary linear closure could not be obtained without significant tension and distortion of adjacent tissue and anatomic structures.  We discussed repair options at length including graft, flap, and second intention. We chose to proceed with a V-Y advancement flap using a laterally based nasalis muscle pedicle. The flap was outlined using a surgical marker, incised, and undermined.  Hemostasis was achieved with electrocautery.  The flap was trimmed appropriately and positioned inferiorly into the defect.  Subcutaneous and epidermal tissues were approximated with the above sutures.     The surgical sites were lightly scrubbed with sterile, saline-soaked gauze. The areas were then bandaged using Vaseline ointment, non-adherent gauze, gauze pads, and tape to provide an adequate pressure dressing. The patient tolerated the procedure well, was given detailed written and verbal wound care instructions, and was discharged in good condition. Wynelle Link protective behavior was also discussed. The patient will follow-up in 1 week.

## 2020-09-23 NOTE — Unmapped (Signed)
Harold Carpen, MD    POST-OPERATIVE WOUND CARE INSTRUCTIONS    1. Take it easy for at least 1 week after your surgery.  Strenuous activity can cause bleeding and separation of your wound.  Avoid running, weight lifting and swimming during this time period.  2. Keep the wound elevated as much as possible; if the wound is on your face, try to sleep in a recliner or on an extra pillow (do NOT sleep face down).  3. Typical post-op pain consists of soreness or mild tenderness usually lasting less than 48 hours.  This is often relieved with extra-strength Tylenol every 8 hours.  Avoid aspirin, NSAIDS (Motrin, Aleve, Advil, etc.), or alcohol for at least the first week (these can cause bleeding).  4. If bleeding occurs, remove the wet bandage then apply firm pressure for 20 minutes (timed by the clock). You can also use an ice-pack to decrease the swelling and bleeding.  If it persists, apply pressure for one more round of 20 minutes.  If it continues, call our office or go to the nearest ER.    7 day bandage change:  1. Do not get the pressure bandage wet.  In 2-3 days, you can remove only the large outer bandage (all white).  If the bandage has become soaked with blood or has loosened before this time, you may replace it with a clean bandage.  Please call with any questions.  2. Usually, we will remove the inner flat bandage (all brown) at your follow-up in approximately 1 week (7 days) unless otherwise noted.  3.    After all bandaging is removed follow the cleaning instructions below.  *It is normal to have dried blood or a small amount of bleeding when the pressure bandage is removed.    3 day bandage change:  1. Remove all bandaging (both outer and inner) in 72 hours (3 days).  2.   After all bandages are removed follow the cleaning instructions below.  *It is normal to have dried blood or a small amount of bleeding when the bandage is removed.    Cleaning:  Do the following every day (TWICE A DAY) for at least 1 month (or until the wound is healed) after all bandages are removed:  ??? Clean the surgical area with a white vinegar/distilled water solution (1-2 tbsp white vinegar* in 1 cup of distilled water).  Soak cotton balls/q-tips in this solution and saturate the wound for 2 minutes.  Use a soaked cotton ball or q-tip to gently remove any crust or drainage.  Gently pat dry.  Crusts and scabs slow down wound healing; gently remove as much as you can.  ??? Apply Vaseline ointment or Polysporin (not Neosporin) to the wound and over any incision lines, stitches, and the surrounding area.  Be generous with the Vaseline and do not let the wound become dry and crusted. (Continue this for 1-2 months or until healed).  ??? Cover with a bandage if needed, especially if you will be in a dirty or dusty environment (or if in direct sun); you may also want to cover before going to bed.    *We recommend using a new bottle of distilled water, a new bottle of white vinegar, and a new container of Vaseline to reduce risk of infection    Check the wound for extensive swelling, bleeding or redness.  It is normal to have some mild oozing, swelling and redness for a few days after surgery.  If the wound  appears infected (increasing redness, significant warmth, extreme tenderness, thick yellow discharge), call our office.    Important tips and points:  *The better your wound care, the better the final appearance of your surgical site and any resulting scar.  *Although your wound may appear healed in 1-2 weeks, it takes at least 3 months for complete healing and 12 months for it to take its final appearance.  *Numbness in the surgical area is expected, and it may take 12-18 months for the feeling to return to normal.  During this time sensations of itching, tingling, and occasional sharp pains might be noted.  These feelings are normal and will subside once the nerves in the skin have healed.  *By limiting the tension across your wound for 3 months, you will minimize any wide ???spread scar??? appearance.  Therefore, try to avoid any very strenuous exercise or heavy lifting for at least 1 month.  Wounds regain only 30% of their tensile strength at 1 month (and only 80% at one year).  *For wounds around the lips and mouth, avoid any wide opening???sorry, no biting into an apple or hamburger for a while.  Try to cut up your food and take little bites. If you floss, use flossing sticks instead of string to minimize the wide mouth opening.  *Avoid sun exposure to your wound and scar to prevent any discoloration or prolonged redness.  You may use sunscreen after it has healed with new skin (about 4-6 weeks).  Cover it up with Vaseline and a bandage until new skin has appeared.  *For FLAPS/GRAFTS: A skin flap or skin graft needs a good blood supply to survive.  Avoid any trauma, friction, or rubbing to the area which can disrupt new blood vessels.  Smoking can decrease the blood supply, so please do not smoke.  Keep the surgical site elevated by sleeping in a recliner or on extra pillows (do NOT sleep face-down).      Please call the [facility] if you have any questions: [phone]  (If after-hours, you may call [phone] and ask for the dermatology resident physician on-call)              -----SUNSCREEN INFORMATION-----    What should I look for in a sunscreen?  ??? Blocks both UVA and UVB rays (Broad Spectrum)  ??? Sunscreen of at least SPF 30 or higher   ??? Physical blockers with titanium dioxide or zinc oxide are great at blocking UV rays and are less likely to cause a skin allergy.  ??? Water resistant for up to either 40 or 80 minutes. Sunscreens are not waterproof or sweat proof.  ??? Some recommended brands include, but are not limited to: Neutrogena, Vanicream, [employer], [place], [place], Cetaphil    When should I use my sunscreen?  ??? Apply sunscreen every day if you will be outside for longer than 10-15 minutes.   ??? UVA rays (cause aging and skin cancer) can penetrate through clouds and windows, including car windows.   ??? We recommend using a lotion with broad spectrum SPF 30 on a daily basis to the face.     How do I use my sunscreen?  ??? Apply sunscreen liberally. A good rule of thumb is to use one ounce (enough to fill a shot glass) for the body. This may change depending on your body size though.  ??? Apply 15 minutes before going outside.  ??? Reapply sunscreen at least every 2 hours, especially after  swimming or heavy sweating. No sunscreens are sweat proof or waterproof.   ??? Remember to protect your lips with a lip balm that contains sunscreen of at least SPF 30.    Are there options besides sunscreen?  ??? Of course!  ??? Avoidance measures:  - Avoid peak hours between 10 am to 4 pm, when the UV rays are the strongest.  - Limit exposure to reflective surfaces such as water, sand, and snow as UV rays can be reflected from these surfaces.  ??? Protective measures:  - Wear a wide-brimmed hat with at least 3 inch brim.  - Sun protective clothing is an excellent option, with clothing, the SPF is called UPF. Look for a UPF of 30 to 50+.  - Recommended brands include: Coolibar, Solumbra, and Sunday Afternoons.   - Check out sporting goods stores such as [employer] and [employer] as well.     Is there a safe way to tan?  ??? Tanning booths are NOT safer than sun exposure. They damage skin just as real sunlight does and lead to wrinkling and skin cancer.  ??? Consider sunless tanning products such as Jergens or L???Oreal. Remember the tan produced by these products does not actually protect you from the sun so continue to apply your sunscreen regularly.

## 2020-09-25 NOTE — Unmapped (Signed)
[facility] Refill Coordination Note    Specialty Medication(s) to be Shipped:   Infectious Disease: Xifaxan    Other medication(s) to be shipped: No additional medications requested for fill at this time     Harold Richards, DOB: [birth year]  Phone: [phone] (home)       All above HIPAA information was verified with patient.     Was a Nurse, learning disability used for this call? No    Completed refill call assessment today to schedule patient's medication shipment from the [facility] [phone]).       Specialty medication(s) and dose(s) confirmed: Regimen is correct and unchanged.   Changes to medications: [name] reports no changes at this time.  Changes to insurance: No  Questions for the pharmacist: No    Confirmed patient received Welcome Packet with first shipment. The patient will receive a drug information handout for each medication shipped and additional FDA Medication Guides as required.       DISEASE/MEDICATION-SPECIFIC INFORMATION        N/A    SPECIALTY MEDICATION ADHERENCE     Medication Adherence    Patient reported X missed doses in the last month: 0  Specialty Medication: xifaxan 550mg   Patient is on additional specialty medications: No  Patient is on more than two specialty medications: No  Any gaps in refill history greater than 2 weeks in the last 3 months: no  Demonstrates understanding of importance of adherence: yes  Informant: patient                Xifaxan 550mg : Patient has 7 days of medication on hand      SHIPPING     Shipping address confirmed in Epic.     Delivery Scheduled: Yes, Expected medication delivery date: 2/23.     Medication will be delivered via Next Day Courier to the prescription address in Epic WAM.    Olga Millers   [facility] Specialty Technician

## 2020-09-29 MED FILL — XIFAXAN 550 MG TABLET: ORAL | 30 days supply | Qty: 60 | Fill #6

## 2020-09-30 ENCOUNTER — Encounter
Admit: 2020-09-30 | Discharge: 2020-10-01 | Payer: PRIVATE HEALTH INSURANCE | Attending: MOHS-Micrographic Surgery | Primary: MOHS-Micrographic Surgery

## 2020-09-30 NOTE — Unmapped (Signed)
Reason for visit/CC: follow-up surgery   HPI: Mohs micrographic surgery was performed last week for 3 BCCs on the left preauricular cheek, left medial cheek, and left side of nose. The wounds were repaired with a V-Y advancement flap, linear closure, and V-Y adv flap respectively. He denies any significant complaints or problems.      For allergies, medications, past medical history, social history, family history: please see previous note and patient chart/Epic  PE: Well-developed, well-nourished, alert and oriented x 3. Surgical sites appear to be healing well with minimal crust and mild erythema. Sutures are intact and there is no significant tenderness, no dehiscence, and no bleeding.         Assessment:     Hx of NMSC, s/p Mohs surgery and repairs as above, doing very well    Plan:  -Bandages removed today and area cleaned  -Continue daily wound care at home with vinegar/water and Vaseline/polysporin per wound care sheet.  -Follow-up PRN with me; and follow-up as scheduled with primary dermatologist

## 2020-09-30 NOTE — Unmapped (Signed)
Coralie Carpen, MD    POST-OPERATIVE WOUND CARE INSTRUCTIONS    1. Take it easy for at least 1 week after your surgery.  Strenuous activity can cause bleeding and separation of your wound.  Avoid running, weight lifting and swimming during this time period.  2. Keep the wound elevated as much as possible; if the wound is on your face, try to sleep in a recliner or on an extra pillow (do NOT sleep face down).  3. Typical post-op pain consists of soreness or mild tenderness usually lasting less than 48 hours.  This is often relieved with extra-strength Tylenol every 8 hours.  Avoid aspirin, NSAIDS (Motrin, Aleve, Advil, etc.), or alcohol for at least the first week (these can cause bleeding).  4. If bleeding occurs, remove the wet bandage then apply firm pressure for 20 minutes (timed by the clock). You can also use an ice-pack to decrease the swelling and bleeding.  If it persists, apply pressure for one more round of 20 minutes.  If it continues, call our office or go to the nearest ER.    7 day bandage change:  1. Do not get the pressure bandage wet.  In 2-3 days, you can remove only the large outer bandage (all white).  If the bandage has become soaked with blood or has loosened before this time, you may replace it with a clean bandage.  Please call with any questions.  2. Usually, we will remove the inner flat bandage (all brown) at your follow-up in approximately 1 week (7 days) unless otherwise noted.  3.    After all bandaging is removed follow the cleaning instructions below.  *It is normal to have dried blood or a small amount of bleeding when the pressure bandage is removed.    3 day bandage change:  1. Remove all bandaging (both outer and inner) in 72 hours (3 days).  2.   After all bandages are removed follow the cleaning instructions below.  *It is normal to have dried blood or a small amount of bleeding when the bandage is removed.    Cleaning:  Do the following every day (TWICE A DAY) for at least 1 month (or until the wound is healed) after all bandages are removed:  ??? Clean the surgical area with a white vinegar/distilled water solution (1-2 tbsp white vinegar* in 1 cup of distilled water).  Soak cotton balls/q-tips in this solution and saturate the wound for 2 minutes.  Use a soaked cotton ball or q-tip to gently remove any crust or drainage.  Gently pat dry.  Crusts and scabs slow down wound healing; gently remove as much as you can.  ??? Apply Vaseline ointment or Polysporin (not Neosporin) to the wound and over any incision lines, stitches, and the surrounding area.  Be generous with the Vaseline and do not let the wound become dry and crusted. (Continue this for 1-2 months or until healed).  ??? Cover with a bandage if needed, especially if you will be in a dirty or dusty environment (or if in direct sun); you may also want to cover before going to bed.    *We recommend using a new bottle of distilled water, a new bottle of white vinegar, and a new container of Vaseline to reduce risk of infection    Check the wound for extensive swelling, bleeding or redness.  It is normal to have some mild oozing, swelling and redness for a few days after surgery.  If the wound  appears infected (increasing redness, significant warmth, extreme tenderness, thick yellow discharge), call our office.    Important tips and points:  *The better your wound care, the better the final appearance of your surgical site and any resulting scar.  *Although your wound may appear healed in 1-2 weeks, it takes at least 3 months for complete healing and 12 months for it to take its final appearance.  *Numbness in the surgical area is expected, and it may take 12-18 months for the feeling to return to normal.  During this time sensations of itching, tingling, and occasional sharp pains might be noted.  These feelings are normal and will subside once the nerves in the skin have healed.  *By limiting the tension across your wound for 3 months, you will minimize any wide ???spread scar??? appearance.  Therefore, try to avoid any very strenuous exercise or heavy lifting for at least 1 month.  Wounds regain only 30% of their tensile strength at 1 month (and only 80% at one year).  *For wounds around the lips and mouth, avoid any wide opening???sorry, no biting into an apple or hamburger for a while.  Try to cut up your food and take little bites. If you floss, use flossing sticks instead of string to minimize the wide mouth opening.  *Avoid sun exposure to your wound and scar to prevent any discoloration or prolonged redness.  You may use sunscreen after it has healed with new skin (about 4-6 weeks).  Cover it up with Vaseline and a bandage until new skin has appeared.  *For FLAPS/GRAFTS: A skin flap or skin graft needs a good blood supply to survive.  Avoid any trauma, friction, or rubbing to the area which can disrupt new blood vessels.  Smoking can decrease the blood supply, so please do not smoke.  Keep the surgical site elevated by sleeping in a recliner or on extra pillows (do NOT sleep face-down).      Please call the [facility] if you have any questions: [phone]  (If after-hours, you may call [phone] and ask for the dermatology resident physician on-call)              -----SUNSCREEN INFORMATION-----    What should I look for in a sunscreen?  ??? Blocks both UVA and UVB rays (Broad Spectrum)  ??? Sunscreen of at least SPF 30 or higher   ??? Physical blockers with titanium dioxide or zinc oxide are great at blocking UV rays and are less likely to cause a skin allergy.  ??? Water resistant for up to either 40 or 80 minutes. Sunscreens are not waterproof or sweat proof.  ??? Some recommended brands include, but are not limited to: Neutrogena, Vanicream, [employer], [place], [place], Cetaphil    When should I use my sunscreen?  ??? Apply sunscreen every day if you will be outside for longer than 10-15 minutes.   ??? UVA rays (cause aging and skin cancer) can penetrate through clouds and windows, including car windows.   ??? We recommend using a lotion with broad spectrum SPF 30 on a daily basis to the face.     How do I use my sunscreen?  ??? Apply sunscreen liberally. A good rule of thumb is to use one ounce (enough to fill a shot glass) for the body. This may change depending on your body size though.  ??? Apply 15 minutes before going outside.  ??? Reapply sunscreen at least every 2 hours, especially after  swimming or heavy sweating. No sunscreens are sweat proof or waterproof.   ??? Remember to protect your lips with a lip balm that contains sunscreen of at least SPF 30.    Are there options besides sunscreen?  ??? Of course!  ??? Avoidance measures:  - Avoid peak hours between 10 am to 4 pm, when the UV rays are the strongest.  - Limit exposure to reflective surfaces such as water, sand, and snow as UV rays can be reflected from these surfaces.  ??? Protective measures:  - Wear a wide-brimmed hat with at least 3 inch brim.  - Sun protective clothing is an excellent option, with clothing, the SPF is called UPF. Look for a UPF of 30 to 50+.  - Recommended brands include: Coolibar, Solumbra, and Sunday Afternoons.   - Check out sporting goods stores such as [employer] and [employer] as well.     Is there a safe way to tan?  ??? Tanning booths are NOT safer than sun exposure. They damage skin just as real sunlight does and lead to wrinkling and skin cancer.  ??? Consider sunless tanning products such as Jergens or L???Oreal. Remember the tan produced by these products does not actually protect you from the sun so continue to apply your sunscreen regularly.

## 2020-10-13 ENCOUNTER — Encounter: Admit: 2020-10-13 | Discharge: 2020-10-13 | Payer: PRIVATE HEALTH INSURANCE

## 2020-10-13 DIAGNOSIS — R4189 Other symptoms and signs involving cognitive functions and awareness: Principal | ICD-10-CM

## 2020-10-13 DIAGNOSIS — R419 Unspecified symptoms and signs involving cognitive functions and awareness: Principal | ICD-10-CM

## 2020-10-13 DIAGNOSIS — R413 Other amnesia: Principal | ICD-10-CM

## 2020-10-13 LAB — T4: T4 TOTAL: 4.7 ug/dL (ref 4.50–10.90)

## 2020-10-13 LAB — TSH: THYROID STIMULATING HORMONE: 0.953 u[IU]/mL (ref 0.550–4.780)

## 2020-10-13 LAB — FOLATE: FOLATE: >24 ng/mL (ref >=5.4)

## 2020-10-13 LAB — VITAMIN B12: VITAMIN B-12: 1281 pg/mL — ABNORMAL HIGH (ref 211–911)

## 2020-10-13 LAB — MYELOMA SERUM CHEMISTRIES
GAMMAGLOBULIN; IGA: 220.2 mg/dL (ref 70.0–400.0)
GAMMAGLOBULIN; IGG: 1098 mg/dL (ref 646–2013)
GAMMAGLOBULIN; IGM: 47 mg/dL (ref 40–230)
PROTEIN TOTAL: 6.6 g/dL (ref 5.7–8.2)

## 2020-10-13 NOTE — Unmapped (Signed)
bloodwork today  Follow up at our [facility]

## 2020-10-13 NOTE — Unmapped (Signed)
[facility] Neurology Clinic Summary       [facility] [address]  [facility] [address] [place]  [address]  [place] [address]  [phone]    Date: 10/13/2020  Patient Name: Harold Richards  MRN: [id number]  PCP: Harold Richards, Harold Richards*            [name] is a 65 y.o. right handed male seen in consultation at the [place] [facility] at the request of Dr. Romeo Richards for evaluation.          Subjective:   Subjective        Obtained history from the patient who was accompanied by wife      HPI: Patient is a 65 y.o. male seen at the request of Harold Richards*.    Onset: at least 1-2 years  Patient tells me that he can walk from bathroom to kitchen and forget why he go there.   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             Past Medical Hx, Family Hx, Social Hx, and Problem List has been reviewed in the [employer].        REVIEW OF SYSTEMS:  Review of Systems   Constitutional: Negative for chills and fever.   HENT: Positive for hearing loss. Negative for tinnitus.    Eyes: Negative for blurred vision and double vision.   Respiratory: Positive for cough and shortness of breath. Cardiovascular: Negative for chest pain and palpitations.   Gastrointestinal: Negative for nausea and vomiting.   Genitourinary: Negative for dysuria, frequency and urgency.   Musculoskeletal: Negative for back pain and neck pain.   Skin: Negative for itching and rash.   Neurological: Positive for headaches. Negative for dizziness, tingling, tremors, sensory change, speech change, focal weakness, seizures, loss of consciousness and weakness.   Psychiatric/Behavioral: Positive for memory loss. The patient has insomnia.               Objective:       Physical Exam:  Blood pressure 110/57, pulse 62, resp. rate 16, height 167.6 cm (5' 5.98), weight 57.2 kg (126 lb 1 oz).       Eyes:      Extraocular Movements: EOM normal.   Neurological:      Mental Status: Oriented to person, place, and time.      Coordination: Finger-Nose-Finger Test normal.      Gait: Tandem walk abnormal.      Deep Tendon Reflexes:      Reflex Scores:       Tricep reflexes are 1+  on the right side and 1+ on the left side.       Bicep reflexes are 1+ on the right side and 1+ on the left side.       Brachioradialis reflexes are 1+ on the right side and 1+ on the left side.       Patellar reflexes are 1+ on the right side and 1+ on the left side.       Achilles reflexes are 1+ on the right side and 1+ on the left side.  Psychiatric:         Speech: Speech normal.               Neurologic Exam     Mental Status   Oriented to person, place, and time.   Oriented to person.   Oriented to place. Oriented to country, city and area.   Oriented to time. Disoriented to date and day. Oriented to year, month and season.   Registration: recalls 2 of 3 objects. Recall of objects at 5 minutes: 0/3. Follows 3 step commands.   Attention: decreased. Concentration: decreased.   Speech: speech is normal   Level of consciousness: alert  Knowledge: good and consistent with education.   Able to name object. Able to read. Unable to repeat (No ifs ands and buts, no ifs, ands or brunts. ). Able to write. Normal comprehension.   Hard time thinking about what to write in a sentence. He wrote a simple sentence My name is Harold Richards.     Could do intersecting pentagons    W-O-R-L-D  D-L-O-???    MMSE 21/30    Biden --> Trump --> Obama --> ????           Cranial Nerves     CN III, IV, VI   Extraocular motions are normal.   Right pupil: Size: 4 mm. Shape: regular.   Left pupil: Size: 4 mm. Shape: regular.   Nystagmus: none   Diplopia: none  Ophthalmoparesis: none  Upgaze: normal  Downgaze: normal  Conjugate gaze: present    CN V   Facial sensation intact.     CN VII   Facial expression full, symmetric.     CN XII   CN XII normal.     Motor Exam   Muscle bulk: normal  Overall muscle tone: normal  Right arm tone: normal  Left arm tone: normal  Right leg tone: normal  Left leg tone: normal    Strength   Right deltoid: 5/5  Left deltoid: 5/5  Right biceps: 5/5  Left biceps: 5/5  Right triceps: 5/5  Left triceps: 5/5  Right wrist flexion: 5/5  Left wrist flexion: 5/5  Right wrist extension: 5/5  Left wrist extension: 5/5  Right interossei: 5/5  Left interossei: 5/5  Right iliopsoas: 5/5  Left iliopsoas: 5/5  Right quadriceps: 5/5  Left quadriceps: 5/5  Right hamstring: 5/5  Left hamstring: 5/5  Right glutei: 5/5  Left glutei: 5/5  Right anterior tibial: 5/5  Left anterior tibial: 5/5  Right posterior tibial: 5/5  Left posterior tibial: 5/5  Right peroneal: 5/5  Left peroneal: 5/5  Right gastroc: 5/5  Left gastroc: 5/5    Sensory Exam   Right arm light touch: normal  Left arm light touch: normal  Right leg light touch: normal  Left leg light touch: normal  Right arm vibration: normal  Left arm vibration: normal    Gait, Coordination, and Reflexes     Coordination  Finger to nose coordination: normal  Tandem walking coordination: abnormal    Tremor   Resting tremor: absent  Intention tremor: absent  Action tremor: absent    Reflexes   Right brachioradialis: 1+  Left brachioradialis: 1+  Right biceps: 1+  Left biceps: 1+  Right triceps: 1+  Left triceps: 1+  Right patellar: 1+  Left patellar: 1+  Right achilles: 1+  Left achilles: 1+                   Assessment & Plan:   Assessment     [name] is a 65 y.o. right handed male seen in consultation for the following problems.         1 Cognitive Impairment  - contributing factors include sleep issues and medications, in addition to smoking and cerebrovascular risk factors  - I am under the imporession that these two issues do not explain completely his memory issues, seems to have some frontotemporal involvement (speech issues and memory). Maybe parietal since he has some issues with planning and performing complex tasks. Some balance issues associated to this.    We will evaluate for mimickers of dementia.       PLAN:  1. B12, Folic Acid, TSH, HIV, RPR, SPEP/IFE  2. MRI brain w.wo. contrast  3. Referral to [facility]   4. Should start remeron for better sleep (he does not remember why he hasn't started it)  5. He will be a good candidate for donepezil or memantine. Since he is averse to meds, I will have him discuss this at [facility] clinic, after he has tried remeron.     To follow up at [facility].     Thank you very much for this consultation. Please let us know if any questions or concerns.     Sincerely,  Lysbeth Galas, MD     CC: Harold Richards, Dawn Sautters*

## 2020-10-14 LAB — MYELOMA WORKUP, SERUM
ALBUMIN (SPE): 3.8 g/dL (ref 3.5–5.0)
ALPHA-1 GLOBULIN: 0.3 g/dL (ref 0.2–0.5)
ALPHA-2 GLOBULIN: 0.9 g/dL (ref 0.5–1.1)
BETA-1 GLOBULIN: 0.3 g/dL (ref 0.3–0.6)
BETA-2 GLOBULIN: 0.3 g/dL (ref 0.2–0.6)
GAMMAGLOBULIN: 1 g/dL (ref 0.5–1.5)
PROTEIN TOTAL: 6.6 g/dL

## 2020-10-14 LAB — SYPHILIS SCREEN: SYPHILIS RPR SCREEN: NONREACTIVE

## 2020-10-14 LAB — HIV ANTIGEN/ANTIBODY COMBO: HIV ANTIGEN/ANTIBODY COMBO: NONREACTIVE

## 2020-10-15 ENCOUNTER — Ambulatory Visit
Admit: 2020-10-15 | Discharge: 2020-10-16 | Payer: PRIVATE HEALTH INSURANCE | Attending: Student in an Organized Health Care Education/Training Program | Primary: Student in an Organized Health Care Education/Training Program

## 2020-10-15 DIAGNOSIS — L570 Actinic keratosis: Principal | ICD-10-CM

## 2020-10-15 DIAGNOSIS — Z85828 Personal history of other malignant neoplasm of skin: Principal | ICD-10-CM

## 2020-10-15 DIAGNOSIS — D492 Neoplasm of unspecified behavior of bone, soft tissue, and skin: Principal | ICD-10-CM

## 2020-10-15 DIAGNOSIS — L814 Other melanin hyperpigmentation: Principal | ICD-10-CM

## 2020-10-15 DIAGNOSIS — L578 Other skin changes due to chronic exposure to nonionizing radiation: Principal | ICD-10-CM

## 2020-10-15 DIAGNOSIS — D229 Melanocytic nevi, unspecified: Principal | ICD-10-CM

## 2020-10-15 NOTE — Unmapped (Signed)
Dermatology Note     Assessment and Plan:      Neoplasm(ia) of unspecified etiology:  - To help confirm diagnosis, biopsy/biopsies obtained today.   Biopsy (Shave) Procedure Note:   After R/B/A discussed (including scarring, pigment alteration, recurrence, or persistence of the lesion) and verbal consent was obtained, the area was marked, photographed, prepped with alcohol, and anesthetized with lidocaine 2% with epinephrine. Biopsy(ies) performed using a shave technique. Hemostasis was achieved with pressure, aluminum chloride, Monsel's, and/or electrocautery. Area was dressed with petrolatum and bandage. Wound care instructions were provided. We will contact the patient with results when available. Patient agrees to be notified of results by Phone.   A) Location: R neck, DDx: BCC vs SCC vs AK vs KA vs SK    Actinic Keratosis(es):   - Discussed premalignant nature of lesions and therefore recommended therapy.   - Patient agrees to cryotherapy. See procedure note for details.  Cryotherapy Procedure Note:   After R/B/A discussed (including scarring, pigment alteration, recurrence, or persistence of the lesion) and verbal consent was obtained, identified lesions were treated with liquid nitrogen x 1 ten second freeze-thaw cycle. The patient tolerated the procedure well and was instructed on post-procedure care.  Total AK's frozen: 11  Location and number: R neck x 1, L temple x 2, L forearm x 4, R forearm x 4    Benign Lesions/ Findings:   Lentigo/Lentigines  Nevus/Nevi-Benign Appearing  - Reassurance provided regarding the benign appearance of lesions noted on exam today; no treatment is indicated in the absence of symptoms/changes.  - Reinforced importance of photoprotective strategies including liberal and frequent sunscreen use of a broad-spectrum SPF 30 or greater, use of protective clothing, and sun avoidance for prevention of cutaneous malignancy and photoaging.  Counseled patient on the importance of regular self-skin monitoring as well as routine clinical skin examinations as scheduled.     Personal history of non-melanoma skin cancer   - No evidence of recurrence at this time.  - Discussed maintaining vigilance and counseled on sun protection as above.      The patient was advised to call for an appointment should any new, changing, or symptomatic lesions develop.     RTC: Return in about 4 months (around 02/14/2021) for skin check. or sooner as needed   _________________________________________________________________      Chief Complaint     Chief Complaint   Patient presents with   ??? Lesion Of Concern     last surgery on left ear has heeled.       HPI     Harold Richards is a 65 y.o. male who presents as a returning patient (last seen 09/30/2020) to [facility] for a full body skin exam. Patient reports several lesions of concern.     Lesions of concern:  - Lesion(s) on the face, neck, and arms present for weeks to months. Became more rough and scaly with time. No pain, bleeding, itching. No prior treatments or biopsies.    The patient denies any other new or changing lesions or areas of concern.     Pertinent Past Medical History     History of skin cancer as outlined below:    Problem List        Other    History of nonmelanoma skin cancer     Skin Cancer History- Non-Melanoma Skin Cancer    Diagnosis Location Biopsy Date Treatment date Procedure Surgeon   [facility] R preauricular  05/2020 Mohs Sallee Lange  [facility]CC L cheek  05/2020 Mohs Varma   [facility] L preauricular  09/2020 Mohs Varma   [facility] L cheek  09/2020 Mohs Varma   [facility] L nose  09/2020 Mohs Varma             Possible history of the past of basal cell with Dr. Orson Aloe treated years ago  ??               Family History:   Negative for melanoma    Past Medical History, Family History, Social History, Medication List, Allergies, and Problem List were reviewed in the rooming section of Epic.     ROS: Other than symptoms mentioned in the HPI, no fevers, chills, or other skin complaints    Physical Examination     GENERAL: Well-appearing male in no acute distress, resting comfortably.  NEURO: Alert and oriented, answers questions appropriately  PSYCH: Normal mood and affect  SKIN (Full Skin Exam): Examination of the face, eyelids, lips, nose, ears, neck, chest, abdomen, back, arms, legs, hands, feet, palms, soles, nails was performed, including scalp  - Actinic Elastosis: moderate chronic sun damage: dyspigmentation, telangiectasia, and wrinkling  - Actinic Keratosis(es): Scaly erythematous macule(s) on the face, arms, R neck  - Lentigo/lentigines: Scattered pigmented macules that are tan to brown in color and are somewhat non-uniform in shape and concentrated in the sun-exposed areas of the face and upper extremities  - Nevus/nevi: Scattered well-demarcated, regular, pigmented macule(s) and/or papule(s) on the scattered diffusely  - Surgical Site(s): Well healed scars without nodularity, re-pigmentation, scaling, erythema, or regrowth  - Lesion A: 3mm pink papule on the R neck.            All areas not commented on are within normal limits or unremarkable      (Approved Template 04/20/2020)

## 2020-10-15 NOTE — Unmapped (Signed)
Shave biopsy   A shave biopsy involves numbing a small area of your skin and then obtaining a sample to help us with proper diagnosis or skin condition. Biopsy results typically return in 7 to 14 days.    To care for the area: Leave the bandage in place until the morning after your procedure is performed. On a daily basis, carefully remove the bandage, then shower or wash as usual. Allow water to run over the site. Please do not scrub. Carefully dry the area, then apply ointment (some people develop an allergy to Neosporin, so we recommend Vaseline orAquaphor). Cover the site with a fresh bandage. Should any bleeding occur, apply firm pressure for 15 minutes. The treated site will heal best if  a scab never forms (the wound heals by new skin cells traveling from the outside toward the middle-their journey is easier if no scab stands in their way).    Long-term care: the site will be more sensitive than your surrounding skin. Keep it covered, and remember to apply sunscreen every day to all your exposed skin. A scar may remain which is lighter or pinker than your normal skin. Your body will continue to improve your scar for up to one year.    Infection following this procedure is rare. However, if you are worried about the appearance of your site, contact your doctor. Complete healing may take up to one month. We have a physician on call at all times. If you have any concerns about the site, please call our clinic at [phone] Cryosurgery  Cryosurgery (???freezing???) uses liquid nitrogen to destroy certain types of skin lesions. Lowering the temperature of the lesion in a small area surrounding skin destroys the lesion. Immediately following cryosurgery, you will notice redness and swelling of the treatment area. Blistering or weeping may occur, lasting approximately one week which will then be followed by crusting. Most areas will heal completely in 10 to 14 days.    Wash the treated areas daily. Allow soap and water to run over the areas, but do not scrub. Should a scab or crust form, allow it to fall off on its own. Do not remove or pick at it. Application of an ointment  and a bandage may make you feel more comfortable, but it is not necessary. Some people develop an allergy to Neosporin, so we recommend that Vaseline or  Aquaphor be used.    The cryotherapy site will be more sensitive than your surrounding skin. Keep it covered, and remember to apply sunscreen every day to all your sun exposed skin. A scar may remain which is lighter or pinker than your normal skin. Your body will continue to improve your scar for up to one year; however a light-colored scar may remain.    Infection following cryotherapy is rare. However if you are worried about the appearance of the treated area, contact your doctor. We have a physician on call at all times. If you have any concerns about the site, please call our clinic at [phone]

## 2020-10-16 NOTE — Unmapped (Signed)
Reviewed pathology and discussed results with patient by MyChart.    Right neck, shave  -Inflamed milium  -Marked solar elastosis  -Negative for malignancy      Benign finding. No treatment needed.     Tracking: No  RV: 4 months as planned      Theora Gianotti, MD  PGY-4 Resident Physician  [facility] Department of Dermatology

## 2020-10-23 DIAGNOSIS — G9340 Encephalopathy, unspecified: Principal | ICD-10-CM

## 2020-10-23 MED ORDER — XIFAXAN 550 MG TABLET
ORAL_TABLET | Freq: Two times a day (BID) | ORAL | 6 refills | 30.00000 days
Start: 2020-10-23 — End: ?

## 2020-10-26 DIAGNOSIS — G9340 Encephalopathy, unspecified: Principal | ICD-10-CM

## 2020-10-26 MED ORDER — TAMSULOSIN 0.4 MG CAPSULE
ORAL_CAPSULE | 1 refills | 0 days | Status: CP
Start: 2020-10-26 — End: ?

## 2020-10-26 NOTE — Unmapped (Signed)
 [facility] Refill Coordination Note    Specialty Medication(s) to be Shipped:   Infectious Disease: Xifaxan    Other medication(s) to be shipped: n/a     Harold Richards, DOB: 08-02-1956  Phone: [phone] (home)       All above HIPAA information was verified with patient's family member, wife Harold Richards.     Was a Nurse, learning disability used for this call? No    Completed refill call assessment today to schedule patient's medication shipment from the [facility] [phone]).       Specialty medication(s) and dose(s) confirmed: Regimen is correct and unchanged.   Changes to medications: Harold Richards reports no changes at this time.  Changes to insurance: No  Questions for the pharmacist: No    Confirmed patient received Welcome Packet with first shipment. The patient will receive a drug information handout for each medication shipped and additional FDA Medication Guides as required.       DISEASE/MEDICATION-SPECIFIC INFORMATION        N/A    SPECIALTY MEDICATION ADHERENCE     Medication Adherence    Patient reported X missed doses in the last month: 0  Specialty Medication: xifaxan 550mg           Unable to confirm quantity on hand      SHIPPING     Shipping address confirmed in Epic.     Delivery Scheduled: Yes, Expected medication delivery date: 3/24.  However, Rx request for refills was sent to the provider as there are none remaining.     Medication will be delivered via Next Day Courier to the prescription address in Epic WAM.    Harold Richards   [facility] Specialty Technician

## 2020-10-26 NOTE — Unmapped (Signed)
ASSESSMENT / PLAN    1. Osteoporosis of lumbar spine  Complicated by cirrhosis - nice response to first 2 years of Reclast  Planning on the fourth and final for this cycle - can have any time    When I see him next year we will likely be starting holiday  So I have ordered density for then.    - Dexa Bone Density Skeletal; Future    2. Cirrhosis (CMS-HCC)  Per GI    3. Seeing urology for complaints in the next few weeks.    Reclast - he had normal BMP in February.  DXA in Feb 2023  RTC in Feb 2023 to see me after DXA      +++++++++++++++++++++++++++++++++++++++++++++    SUBJECTIVE    HPI: Harold Richards is a 65 y.o. male with osteoporosis/ cirrhosis/ prostate (lupron) Ca last seen 11/13/2019    Is seeing Dr Marcy Salvo for burning/some bleeding in urine - has an appointment for urology 4/2    Not on lupron now.  Retired in October 2021 - he is quite happy with this.    Wife 2021 dx with breast cancer  Doing a little better.    No fractures, really feeling pretty well. He says he is forgetful, and couldn't remember sequence for pumping gas. Thus his wife had him see neurology   W/u so far negative.    He otherwise looks after his 2 horses (3 passed on in the last year) - he has 3 grandchildren who he spends a lot of enjoyable time with.    1. Osteoporosis - Multiple contributors potential for spinal osteoporosis - including cirrhosis (testosterone), kidney stones (PTH, renal leak)   I had prescribed Reclast first 2018 for probably 4 years  2020 - Nice result with first 2 years of Reclast.  3rd infusion 10-09-2019   Supposed to have 1 more --    We will delay getting DXA until when holiday expected - so that will be Reclast 2022, holiday expected starting Mar 2023    Bone History  Rib fracture x 2 in 2015 - fell off truck  Still working - does okay.  Is exhausted when he is not at work.  Sexual function is decreased - able to have erection. No change in shaving.  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 Proximal left femur T score is -1.8. Femoral neck density T score is -1.7. ??  2020 2 years Reclast       L1 to 4  -2.3.  significant increase of 3.5%   Proximal left femur -1.9.  Femoral neck  -1.9.       2. Gastroesophageal reflux disease    3. 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 Prostate cancer x2019   Dx: high??risk prostate cancer, cT1c, Gleason??4+4=8??in 2/12??cores, PSA 4.59. EBRT x 5 wks + Brachytherapy + 18 mon ADT.    Also Lupron q 3 months.  Seen 09/2020 thought to be stable    Pertinent Medical History:   Sialolithiasis of submandibular gland - excised 2016  Positive recreational drug use (cocaine IV, intranasal and crack) --clean x 10 years  Kidney stones -  Last was 2015 x 2 -  No family history. No scan  2017  ALK 50, calcium 9.6, MCV 106, Vitamin  D 55    REVIEW OF SYSTEMS:negative except for as stated in HPI.  Note stones.  Hepatitis C - with some degree of compensated cirrhosis but patient does not have any symptoms.    Social History:smoker, married. Grown children 30s - 2 grandchildren.  Works at [employer] in [employer]    Family history: parents - none with hip fracture - grandmother with hip fracture.  Mother lung cancer death, dad still living age 72  Brother deceased age 51, Sister at age 41 - both car accidents.    Current Outpatient Medications:   ???  albuterol HFA 90 mcg/actuation inhaler, Inhale 2 puffs every four (4) hours as needed for wheezing., Disp: 8 g, Rfl: 3  ???  aspirin (ECOTRIN) 81 MG tablet, Take 81 mg by mouth daily., Disp: , Rfl:   ???  atorvastatin (LIPITOR) 20 MG tablet, Take 1 tablet (20 mg total) by mouth nightly., Disp: 90 tablet, Rfl: 3  ???  cholecalciferol, vitamin D3, (VITAMIN D3) 1,000 unit capsule, Take 1,000 Units by mouth daily., Disp: , Rfl:   ???  cyanocobalamin (VITAMIN B-12) 250 MCG tablet, Take 250 mcg by mouth daily., Disp: , Rfl:   ???  ferrous sulfate 325 mg (65 mg iron) CpER, Take 1 tablet by mouth daily. , Disp: , Rfl:   ???  fluticasone-umeclidin-vilanter (TRELEGY ELLIPTA) 100-62.5-25 mcg inhaler, Inhale 1 puff daily., Disp: 90 each, Rfl: 3  ???  guaiFENesin (MUCINEX) 600 mg 12 hr tablet, Take 600 mg by mouth every morning., Disp: , Rfl:   ???  lactulose (CHRONULAC) 10 gram/15 mL solution, Take 15 mL (10 g total) by mouth Two (2) times a day., Disp: 240 mL, Rfl: 0  ???  loratadine (CLARITIN) 10 mg tablet, Take 10 mg by mouth daily., Disp: , Rfl:   ???  mirtazapine (REMERON) 7.5 MG tablet, Take 1 tablet (7.5 mg total) by mouth nightly., Disp: 30 tablet, Rfl: 6  ???  multivitamin (TAB-A-VITE/THERAGRAN) per tablet, Take 1 tablet by mouth daily., Disp: , Rfl:   ???  rifAXIMin (XIFAXAN) 550 mg Tab, Take 1 tablet (550 mg total) by mouth Two (2) times a day., Disp: 60 tablet, Rfl: 6  ???  SUBOXONE 8-2 mg sublingual film, Place 1 Film under the tongue Two (2) times a day. , Disp: , Rfl: 0  ???  tamsulosin (FLOMAX) 0.4 mg capsule, TAKE 1 CAPSULE(0.4 MG) BY MOUTH TWICE DAILY, Disp: 180 capsule, Rfl: 1    +++++++++++++++++++    OBJECTIVE  BP 108/58  - Pulse 75  - Ht 167.6 cm (5' 5.98)  - Wt 57.6 kg (127 lb)  - BMI 20.51 kg/m??      GENERAL: Patient appears stated age, in NAD  Tremor: no  EOM: Grossly intact.  Skin: normal, with normal hair distribution   NEUROLOGIC: grossly intact.   PSYCHIATRIC: mood and affect were appropriate    Pertinent labs:  Office Visit on 10/15/2020   Component Date Value Ref Range Status   ??? Case Report 10/15/2020    Final                    Value:Surgical Pathology Report                         Case: [id number]  Authorizing Provider:  Leandro Reasoner, MD               Collected:           10/15/2020 0823              Ordering Location:     [facility]   Received:            10/15/2020 1633                                     CENTER [facility] Pathologist:           Abbie Sons, MD                                                         Specimen:    Skin, R neck, shave                                                                       ??? Diagnosis 10/15/2020    Final                    Value:This result contains rich text formatting which cannot be displayed here.   ??? Clinical History 10/15/2020    Final                    Value:This result contains rich text formatting which cannot be displayed here.   ??? Gross Description 10/15/2020    Final                    Value:This result contains rich text formatting which cannot be displayed here.   ??? Microscopic Description 10/15/2020    Final                    Value:This result contains rich text formatting which cannot be displayed here.   Appointment on 10/13/2020   Component Date Value Ref Range Status   ??? Vitamin B-12 10/13/2020 1,281 (A) 211 - 911 pg/ml Final   ??? Folate 10/13/2020 >24.0  >=5.4 ng/mL Final   ??? RPR 10/13/2020 Nonreactive  Nonreactive Final   ??? HIV Antigen/Antibody Combo 10/13/2020 Nonreactive  Nonreactive Final    HIV-1 p24 Ag and HIV-1/HIV-2 Ab were NOT DETECTED in this sample.  The HIV-1/2 antigen antibody combination test (4th generation) detects both HIV-1/HIV-2 antibody and HIV-1 p24 antigen.     ??? TSH 10/13/2020 0.953  0.550 - 4.780 uIU/mL Final   ??? T4, Total 10/13/2020 4.70  4.50 - 10.90 ug/dL Final   ??? T Albumin 16/05/9603 3.8  3.5 - 5.0 g/dL Final   ??? Alpha-1 Globulin 10/13/2020 0.3  0.2 - 0.5 g/dL Final   ??? Alpha-2 Globulin 10/13/2020 0.9  0.5 - 1.1 g/dL Final   ??? Beta-1 Globulin 10/13/2020 0.3  0.3 - 0.6 g/dL Final   ??? Beta-2 Globulin 10/13/2020 0.3  0.2 - 0.6 g/dL Final   ???  Gammaglobulin 10/13/2020 1.0  0.5 - 1.5 g/dL Final   ??? SPE Interpretation 10/13/2020    Final    The SPE pattern demonstrates a slight irregularity in the gamma region, which may represent monoclonal protein.    See Immunofixation report.     ??? Immunofixation Electrophoresis, Se* 10/13/2020    Final    No monoclonal protein detected.     ??? Total Protein 10/13/2020 6.6  g/dL Final   ??? Total Protein 10/13/2020 6.6  5.7 - 8.2 g/dL Final   ??? Total IgG 16/05/9603 1,098  646-2,013 mg/dL Final   ??? IgM 54/04/8118 47  40 - 230 mg/dL Final   ??? IgA 14/78/2956 220.2  70.0 - 400.0 mg/dL Final       Pertinent Radiology:  2020

## 2020-10-27 MED ORDER — RIFAXIMIN 550 MG TABLET
ORAL_TABLET | Freq: Two times a day (BID) | ORAL | 6 refills | 30 days | Status: CP
Start: 2020-10-27 — End: ?
  Filled 2020-10-28: qty 60, 30d supply, fill #0

## 2020-10-28 ENCOUNTER — Encounter
Admit: 2020-10-28 | Discharge: 2020-10-29 | Payer: PRIVATE HEALTH INSURANCE | Attending: "Endocrinology | Primary: "Endocrinology

## 2020-10-28 DIAGNOSIS — M810 Age-related osteoporosis without current pathological fracture: Principal | ICD-10-CM

## 2020-10-28 DIAGNOSIS — K7469 Other cirrhosis of liver: Principal | ICD-10-CM

## 2020-10-28 NOTE — Unmapped (Signed)
Bones:  1. You should call for your 4th Reclast dose (downstairs)  [facility]: [phone]  Anytime is fine (last year was 10-09-2019.    2. Feb 2023 - I have ordered the density scan for next year.  Have this before you come and see me  Radiology scheduling: [phone]       3. I'll see you in Feb or March 2023

## 2020-11-03 ENCOUNTER — Encounter: Admit: 2020-11-03 | Discharge: 2020-11-04 | Payer: PRIVATE HEALTH INSURANCE

## 2020-11-03 MED ADMIN — gadobenate dimeglumine (MULTIHANCE) 529 mg/mL (0.1mmol/0.2mL) solution 10 mL: 10 mL | INTRAVENOUS | @ 20:00:00 | Stop: 2020-11-03

## 2020-11-05 DIAGNOSIS — K7290 Hepatic failure, unspecified without coma: Principal | ICD-10-CM

## 2020-11-05 DIAGNOSIS — K7460 Unspecified cirrhosis of liver: Principal | ICD-10-CM

## 2020-11-05 MED ORDER — LACTULOSE 10 GRAM/15 ML ORAL SOLUTION
Freq: Two times a day (BID) | ORAL | 6 refills | 30 days | Status: CP
Start: 2020-11-05 — End: ?

## 2020-11-05 NOTE — Unmapped (Signed)
RX refill for lactulose completed.

## 2020-11-06 ENCOUNTER — Encounter: Admit: 2020-11-06 | Discharge: 2020-11-07 | Payer: PRIVATE HEALTH INSURANCE | Attending: Urology | Primary: Urology

## 2020-11-06 DIAGNOSIS — R350 Frequency of micturition: Principal | ICD-10-CM

## 2020-11-06 DIAGNOSIS — R310 Gross hematuria: Principal | ICD-10-CM

## 2020-11-06 LAB — URINALYSIS WITH CULTURE REFLEX
BACTERIA: NONE SEEN /HPF
BILIRUBIN UA: NEGATIVE
BLOOD UA: NEGATIVE
GLUCOSE UA: NEGATIVE
LEUKOCYTE ESTERASE UA: NEGATIVE
NITRITE UA: NEGATIVE
PH UA: 5 (ref 5.0–9.0)
PROTEIN UA: NEGATIVE
RBC UA: 1 /HPF (ref <=3)
SPECIFIC GRAVITY UA: 1.027 (ref 1.003–1.030)
SQUAMOUS EPITHELIAL: <1 /HPF (ref 0–5)
UROBILINOGEN UA: 0.2
WBC UA: <1 /HPF (ref <=2)

## 2020-11-06 MED ORDER — OXYBUTYNIN CHLORIDE ER 5 MG TABLET,EXTENDED RELEASE 24 HR
ORAL_TABLET | Freq: Every day | ORAL | 11 refills | 30 days | Status: CP
Start: 2020-11-06 — End: 2021-11-06

## 2020-11-06 NOTE — Unmapped (Signed)
Assessment/Plan   Harold Richards is a 65 y.o. gentleman w/ history of 4+4 prostate cancer treated w/ EBRT, brachy boost (2020), ADT (2021).     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 Already has CTU to clear upper tracts.    Discussed cystoscopy as next step to ensure no concerning bladder findings.  Explained this is a look inside his bladder with a camera while he is awake.  May need procedure under anesthesia if there are any concerning findings.    Plan  -UA + cytology today (positive cytology, straight to T[facility])  ???PVR 17, start with Miralax for constipation, if not improvement, start ditropan XL5mg . Sent to pharmacy   ???Continue Flomax  ???Set up for next available cystoscopy within 6 weeks  -Continue excellent care by radiation oncology for prostate cancer    Referring Physician:  Roma Schanz, MD  [address]  [address]  [place],  [place] [address]    PCP:  [name], MD    Chief Complaint: Prostate cancer      History of Present Illness:   Harold Richards is a 65 y.o. with history of prostate cancer.    Last seen by [facility] in 2020. Gleason 4+4 disease. EBRT, 45Gy w/ brachy boost (completion 11/05/2018). Lupron then eliguard 2019-April 2021.  PSA responded w/ trend as below    Lab Results   Component Value Date/Time    PSA <0.04 09/15/2020 11:08 AM    PSA <0.04 05/28/2020 02:11 PM    PSA <0.04 02/27/2020 08:34 AM    PSA <0.10 08/28/2019 09:25 AM    PSA <0.10 01/16/2019 09:07 AM    PSA 4.59 (H) 05/18/2018 02:45 PM    PSA 3.88 10/26/2017 07:58 AM    PSA 4.15 (H) 07/27/2017 08:57 AM    PSASCRN 2.2 02/28/2012 01:52 PM     Last seen by Rad Onc 09/15/20. 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 UA 09/22/20 NO Blood    09/11/20 CTU - nonobstructing stones, otherwise normal.     Gross hematuria in Jan 2022 for about 2 weeks. Burning with urination, lots of clots. Continued for a few weeks, currently resolved. Still with urgency, frequency (9-10xday), nocturia (x3), weak stream. On flomax. Constipation an issue    History of LUTS: IPSS 19 in 2020.    PVR 25ml (10/2020)    No prior abdominal surgery. No CV or stroke history. Takes ASA 81mg  for primary prevention.   Smokes 1ppd x 50y.     Retired Buyer, retail, very active. Enjoys soccer with Grandchildren.     Medical History:  Past Medical History:   Diagnosis Date   ??? Actinic keratosis    ??? Asthma    ??? Basal cell carcinoma    ??? Chronic headaches    ??? Colon polyp    ??? Enlarged prostate    ??? GERD (gastroesophageal reflux disease) 02/08/2013   ??? Hepatitis C    ??? Hx of substance abuse (CMS-HCC)     hx of recreational drug use (cocaine IV, intranasal and crack),    ??? Infectious viral hepatitis    ??? Lung nodule seen on imaging study 07/12/2016   ??? Osteoporosis    ??? Other emphysema (CMS-HCC)    ??? Renal calculus    ??? Skin cancer    ??? Tobacco abuse 02/08/2013       Surgical History:  Past Surgical History:   Procedure Laterality Date   ???  CERVICAL FUSION      cage   ??? PR COLSC FLX W/RMVL OF TUMOR POLYP LESION SNARE TQ N/A 10/20/2015    Procedure: COLONOSCOPY FLEX; W/REMOV TUMOR/LES BY SNARE;  Surgeon: Annie Paras, MD;  [facility];  Service: Gastroenterology   ??? PR EXCISION SUBMAXILLARY GLAND Right 03/31/2015    Procedure: EXC SUBMANDIBULAR GLAND;  Surgeon: Lauralee Evener, MD;  Location: ASC OR [facility]  Service: ENT   ??? PR REMV UPPER JAW-MAXILLECTOMY Left 07/22/2019    Procedure: MAXILLECTOMY; WO ORBITAL EXENTERATION;  Surgeon: Adam Swaziland Kimple, MD;  Location: ASC OR [facility];  Service: ENT   ??? PR STEREOTACTIC COMP ASSIST PROC,CRANIAL,EXTRADURAL Left 07/22/2019    Procedure: STEREOTACTIC COMPUTER-ASSISTED (NAVIGATIONAL) PROCEDURE; CRANIAL, EXTRADURAL;  Surgeon: Adam Swaziland Kimple, MD;  Location: ASC [facility]  Service: ENT   ??? PR UPPER GI ENDOSCOPY,DIAGNOSIS N/A 08/18/2017    Procedure: UGI ENDO, INCLUDE ESOPHAGUS, STOMACH, & DUODENUM &/OR JEJUNUM; DX W/WO COLLECTION SPECIMN, BY BRUSH OR WASH;  Surgeon: Rona Ravens, MD;  Location: GI [facility];  Service: Gastroenterology   ??? PR UPPER GI ENDOSCOPY,DIAGNOSIS N/A 11/08/2019    Procedure: UGI ENDO, INCLUDE ESOPHAGUS, STOMACH, & DUODENUM &/OR JEJUNUM; DX W/WO COLLECTION SPECIMN, BY BRUSH OR WASH;  Surgeon: Janyth Pupa, MD;  Location: GI [facility];  Service: Gastroenterology   ??? SKIN BIOPSY         Social History:  reports that he has been smoking cigarettes. He started smoking about 51 years ago. He has a 12.50 pack-year smoking history. He quit smokeless tobacco use about 13 years ago.  His smokeless tobacco use included chew. He reports previous drug use. Drugs: Cocaine, IV, and Marijuana. He reports that he does not drink alcohol.    Family History:family history includes Bone cancer in his mother; Heart attack in his father; Hepatitis in his sister; Hypertension in his father and mother; Lung cancer in his mother.    Medications:   Current Outpatient Medications:   ???  albuterol HFA 90 mcg/actuation inhaler, Inhale 2 puffs every four (4) hours as needed for wheezing., Disp: 8 g, Rfl: 3  ???  aspirin (ECOTRIN) 81 MG tablet, Take 81 mg by mouth daily., Disp: , Rfl:   ???  atorvastatin (LIPITOR) 20 MG tablet, Take 1 tablet (20 mg total) by mouth nightly., Disp: 90 tablet, Rfl: 3  ???  cholecalciferol, vitamin D3, (VITAMIN D3) 1,000 unit capsule, Take 1,000 Units by mouth daily., Disp: , Rfl:   ???  cyanocobalamin (VITAMIN B-12) 250 MCG tablet, Take 250 mcg by mouth daily., Disp: , Rfl:   ???  ferrous sulfate 325 mg (65 mg iron) CpER, Take 1 tablet by mouth daily. , Disp: , Rfl:   ???  fluticasone-umeclidin-vilanter (TRELEGY ELLIPTA) 100-62.5-25 mcg inhaler, Inhale 1 puff daily., Disp: 90 each, Rfl: 3  ???  guaiFENesin (MUCINEX) 600 mg 12 hr tablet, Take 600 mg by mouth every morning., Disp: , Rfl:   ???  lactulose (CHRONULAC) 10 gram/15 mL solution, Take 15 mL (10 g total) by mouth Two (2) times a day., Disp: 900 mL, Rfl: 6  ???  loratadine (CLARITIN) 10 mg tablet, Take 10 mg by mouth daily., Disp: , Rfl:   ???  mirtazapine (REMERON) 7.5 MG tablet, Take 1 tablet (7.5 mg total) by mouth nightly., Disp: 30 tablet, Rfl: 6  ???  multivitamin (TAB-A-VITE/THERAGRAN) per tablet, Take 1 tablet by mouth daily., Disp: , Rfl:   ???  rifAXIMin [name])  550 mg Tab, Take 1 tablet (550 mg total) by mouth Two (2) times a day., Disp: 60 tablet, Rfl: 6  ???  SUBOXONE 8-2 mg sublingual film, Place 1 Film under the tongue Two (2) times a day. , Disp: , Rfl: 0  ???  tamsulosin (FLOMAX) 0.4 mg capsule, TAKE 1 CAPSULE(0.4 MG) BY MOUTH TWICE DAILY, Disp: 180 capsule, Rfl: 1    Allergies:is allergic to bicalutamide, penicillins, and rocephin [ceftriaxone].    Review of Systems:   Remainder of a 10 system review of systems is negative.     Objective   BP 143/70  - Pulse 62  - Temp 36.3 ??C  - Resp 14  - Wt 57.5 kg (126 lb 11.2 oz)  - BMI 20.46 kg/m??   General Appearance:  No acute distress. Alert and oriented x 3.  Pulmonary: Normal respiratory effort on room air  Cardiovascular: Regular rate  Abdomen: Soft, non-tender, without masses.  Musculoskeletal: Normal gait. Extremities without edema.  Neurologic:  No motor abnormalities noted.      Imaging:     CTU Feb 2022:  Punctate nonobstructing right nephrolithiasis. Otherwise, no suspicious renal or urothelial lesions.  I saw and evaluated the patient, participating in the key portions of the service.?? I reviewed the resident???s note.?? I agree with the resident???s findings and plan. Kallie Edward, MD

## 2020-11-13 NOTE — Unmapped (Signed)
Rx refill for lactulose confirmed.

## 2020-11-19 NOTE — Unmapped (Signed)
[facility] Clinical Assessment & Refill Coordination Note    Harold Richards, [place]: [birth year]  Phone: [phone] (home)     All above HIPAA information was verified with patient.     Was a Nurse, learning disability used for this call? No    Specialty Medication(s):   Infectious Disease: Xifaxan     Current Outpatient Medications   Medication Sig Dispense Refill   ??? albuterol HFA 90 mcg/actuation inhaler Inhale 2 puffs every four (4) hours as needed for wheezing. 8 g 3   ??? aspirin (ECOTRIN) 81 MG tablet Take 81 mg by mouth daily.     ??? atorvastatin (LIPITOR) 20 MG tablet Take 1 tablet (20 mg total) by mouth nightly. 90 tablet 3   ??? cholecalciferol, vitamin D3, (VITAMIN D3) 1,000 unit capsule Take 1,000 Units by mouth daily.     ??? cyanocobalamin (VITAMIN B-12) 250 MCG tablet Take 250 mcg by mouth daily.     ??? ferrous sulfate 325 mg (65 mg iron) CpER Take 1 tablet by mouth daily.      ??? fluticasone-umeclidin-vilanter (TRELEGY ELLIPTA) 100-62.5-25 mcg inhaler Inhale 1 puff daily. 90 each 3   ??? guaiFENesin (MUCINEX) 600 mg 12 hr tablet Take 600 mg by mouth every morning.     ??? lactulose (CHRONULAC) 10 gram/15 mL solution Take 15 mL (10 g total) by mouth Two (2) times a day. 900 mL 6   ??? loratadine (CLARITIN) 10 mg tablet Take 10 mg by mouth daily.     ??? mirtazapine (REMERON) 7.5 MG tablet Take 1 tablet (7.5 mg total) by mouth nightly. 30 tablet 6   ??? multivitamin (TAB-A-VITE/THERAGRAN) per tablet Take 1 tablet by mouth daily.     ??? oxybutynin (DITROPAN XL) 5 MG 24 hr tablet Take 1 tablet (5 mg total) by mouth daily. 30 tablet 11   ??? rifAXIMin (XIFAXAN) 550 mg Tab Take 1 tablet (550 mg total) by mouth Two (2) times a day. 60 tablet 6   ??? SUBOXONE 8-2 mg sublingual film Place 1 Film under the tongue Two (2) times a day.   0   ??? tamsulosin (FLOMAX) 0.4 mg capsule TAKE 1 CAPSULE(0.4 MG) BY MOUTH TWICE DAILY 180 capsule 1     No current facility-administered medications for this visit.        Changes to medications: [name] reports no changes at this time.    Allergies   Allergen Reactions   ??? Bicalutamide Rash   ??? Penicillins Rash   ??? Rocephin [Ceftriaxone] Rash     Patient received in Emergency Room and reported rash to stomach and back several hours later.        Changes to allergies: No    SPECIALTY MEDICATION ADHERENCE     Xifaxan 550 mg: approximately 9 days of medicine on hand       Medication Adherence    Patient reported X missed doses in the last month: 0  Specialty Medication: Xifaxan 550mg   Patient is on additional specialty medications: No  Any gaps in refill history greater than 2 weeks in the last 3 months: no  Demonstrates understanding of importance of adherence: yes  Informant: patient  Provider-estimated medication adherence level: good  Patient is at risk for Non-Adherence: No  Confirmed plan for next specialty medication refill: delivery by pharmacy  Refills needed for supportive medications: not needed          Specialty medication(s) dose(s) confirmed: Regimen is correct and unchanged.  Are there any concerns with adherence? No    Adherence counseling provided? Not needed    CLINICAL MANAGEMENT AND INTERVENTION      Clinical Benefit Assessment:    Do you feel the medicine is effective or helping your condition? Yes    Clinical Benefit counseling provided? Progress note from 09/22/20 shows evidence of clinical benefit    Adverse Effects Assessment:    Are you experiencing any side effects? No    Are you experiencing difficulty administering your medicine? No    Quality of Life Assessment:    How many days over the past month did your hepatic encephalopathy  keep you from your normal activities? For example, brushing your teeth or getting up in the morning. 0    Have you discussed this with your provider? Not needed    Acute Infection Status:    Acute infections noted within Epic:  No active infections  Patient reported infection: None    Therapy Appropriateness:    Is therapy appropriate? Yes, therapy is appropriate and should be continued    DISEASE/MEDICATION-SPECIFIC INFORMATION      N/A    PATIENT SPECIFIC NEEDS     - Does the patient have any physical, cognitive, or cultural barriers? No    - Is the patient high risk? No    - Does the patient require a Care Management Plan? No     - Does the patient require physician intervention or other additional services (i.e. nutrition, smoking cessation, social work)? No      SHIPPING     Specialty Medication(s) to be Shipped:   Infectious Disease: Xifaxan    Other medication(s) to be shipped: No additional medications requested for fill at this time     Changes to insurance: No    Delivery Scheduled: Yes, Expected medication delivery date: 11/24/20.     Medication will be delivered via Next Day Courier to the confirmed prescription address in [facility].    The patient will receive a drug information handout for each medication shipped and additional FDA Medication Guides as required.  Verified that patient has previously received a Conservation officer, historic buildings and a Surveyor, mining.    All of the patient's questions and concerns have been addressed.    Roderic Palau   [facility] Specialty Pharmacist

## 2020-11-23 MED FILL — XIFAXAN 550 MG TABLET: ORAL | 30 days supply | Qty: 60 | Fill #1

## 2020-11-25 LAB — COMPREHENSIVE METABOLIC PANEL
ALBUMIN: 3.6 g/dL (ref 3.4–5.0)
ALKALINE PHOSPHATASE: 44 U/L — ABNORMAL LOW (ref 46–116)
ALT (SGPT): 15 U/L (ref 10–49)
ANION GAP: 4 mmol/L — ABNORMAL LOW (ref 5–14)
AST (SGOT): 29 U/L (ref <=34)
BILIRUBIN TOTAL: <0.2 mg/dL — ABNORMAL LOW (ref 0.3–1.2)
BLOOD UREA NITROGEN: 23 mg/dL (ref 9–23)
BUN / CREAT RATIO: 18
CALCIUM: 8.8 mg/dL (ref 8.7–10.4)
CHLORIDE: 115 mmol/L — ABNORMAL HIGH (ref 98–107)
CO2: 23 mmol/L (ref 20.0–31.0)
CREATININE: 1.25 mg/dL — ABNORMAL HIGH
EGFR CKD-EPI AA MALE: 71 mL/min/{1.73_m2} (ref >=60)
EGFR CKD-EPI NON-AA MALE: 61 mL/min/{1.73_m2} (ref >=60)
GLUCOSE RANDOM: 111 mg/dL (ref 70–179)
POTASSIUM: 4.1 mmol/L (ref 3.4–4.8)
PROTEIN TOTAL: 6.6 g/dL (ref 5.7–8.2)
SODIUM: 142 mmol/L (ref 135–145)

## 2020-11-25 LAB — CBC W/ AUTO DIFF
BASOPHILS ABSOLUTE COUNT: 0.1 10*9/L (ref 0.0–0.1)
BASOPHILS RELATIVE PERCENT: 0.8 %
EOSINOPHILS ABSOLUTE COUNT: 0.1 10*9/L (ref 0.0–0.5)
EOSINOPHILS RELATIVE PERCENT: 1.2 %
HEMATOCRIT: 36.4 % — ABNORMAL LOW (ref 39.0–48.0)
HEMOGLOBIN: 12.5 g/dL — ABNORMAL LOW (ref 12.9–16.5)
LYMPHOCYTES ABSOLUTE COUNT: 1 10*9/L — ABNORMAL LOW (ref 1.1–3.6)
LYMPHOCYTES RELATIVE PERCENT: 12.3 %
MEAN CORPUSCULAR HEMOGLOBIN CONC: 34.3 g/dL (ref 32.0–36.0)
MEAN CORPUSCULAR HEMOGLOBIN: 35.3 pg — ABNORMAL HIGH (ref 25.9–32.4)
MEAN CORPUSCULAR VOLUME: 103 fL — ABNORMAL HIGH (ref 77.6–95.7)
MEAN PLATELET VOLUME: 7.9 fL (ref 6.8–10.7)
MONOCYTES ABSOLUTE COUNT: 0.5 10*9/L (ref 0.3–0.8)
MONOCYTES RELATIVE PERCENT: 6.3 %
NEUTROPHILS ABSOLUTE COUNT: 6.7 10*9/L (ref 1.8–7.8)
NEUTROPHILS RELATIVE PERCENT: 79.4 %
PLATELET COUNT: 233 10*9/L (ref 150–450)
RED BLOOD CELL COUNT: 3.54 10*12/L — ABNORMAL LOW (ref 4.26–5.60)
RED CELL DISTRIBUTION WIDTH: 13.1 % (ref 12.2–15.2)
WBC ADJUSTED: 8.4 10*9/L (ref 3.6–11.2)

## 2020-11-25 LAB — LIPASE: LIPASE: 33 U/L (ref 12–53)

## 2020-11-25 LAB — HIGH SENSITIVITY TROPONIN I - SINGLE: HIGH SENSITIVITY TROPONIN I: 6 ng/L (ref <=53)

## 2020-11-25 NOTE — Unmapped (Signed)
Addended by: Tresa Endo on: 11/25/2020 12:37 PM     Modules accepted: Orders

## 2020-11-26 ENCOUNTER — Ambulatory Visit
Admit: 2020-11-26 | Discharge: 2020-11-26 | Disposition: A | Discharge status: Home / Self Care | Payer: PRIVATE HEALTH INSURANCE

## 2020-11-26 ENCOUNTER — Emergency Department
Admit: 2020-11-26 | Discharge: 2020-11-26 | Disposition: A | Discharge status: Home / Self Care | Payer: PRIVATE HEALTH INSURANCE

## 2020-11-26 LAB — URINALYSIS WITH CULTURE REFLEX
BACTERIA: NONE SEEN /HPF
BILIRUBIN UA: NEGATIVE
BLOOD UA: NEGATIVE
GLUCOSE UA: NEGATIVE
LEUKOCYTE ESTERASE UA: NEGATIVE
NITRITE UA: NEGATIVE
PH UA: 5 (ref 5.0–9.0)
PROTEIN UA: NEGATIVE
RBC UA: <1 /HPF (ref <=3)
SPECIFIC GRAVITY UA: 1.021 (ref 1.003–1.030)
SQUAMOUS EPITHELIAL: <1 /HPF (ref 0–5)
UROBILINOGEN UA: 0.2
WBC UA: 1 /HPF (ref <=2)

## 2020-11-26 MED ORDER — OXYCODONE 5 MG TABLET
ORAL_TABLET | Freq: Four times a day (QID) | ORAL | 0 refills | 5 days | Status: CP | PRN
Start: 2020-11-26 — End: 2020-12-01

## 2020-11-26 MED ADMIN — HYDROmorphone (PF) (DILAUDID) injection 0.5 mg: 0.5 mg | INTRAVENOUS | @ 02:00:00 | Stop: 2020-11-25

## 2020-11-26 MED ADMIN — HYDROmorphone (PF) (DILAUDID) injection 0.5 mg: 0.5 mg | INTRAVENOUS | @ 03:00:00 | Stop: 2020-11-25

## 2020-11-26 MED ADMIN — oxyCODONE (ROXICODONE) immediate release tablet 10 mg: 10 mg | ORAL | @ 06:00:00 | Stop: 2020-11-26

## 2020-11-26 MED ADMIN — acetaminophen (TYLENOL) tablet 500 mg: 500 mg | ORAL | @ 04:00:00 | Stop: 2020-11-26

## 2020-11-26 NOTE — Unmapped (Addendum)
Unable to contact-leave a vm to call back and schedule appt with PCP for ED follow up    ----- Message from Roma Schanz, MD sent at 11/26/2020  1:12 PM EDT -----  Regarding: ED follow-up  Can you please schedule him for follow-up with me after his ED visit for kidney stones in the next couple of weeks?  If I don' have availability, then can you schedule with my resident team when I am precepting in resident clinic on Mon AMs?   Thanks!  CDG

## 2020-11-26 NOTE — Unmapped (Signed)
Pt BIB [facility] from home.  Per EMS pt had sudden onset severe left-sided abdominal pain, no known trauma or precipitating event.  Per EMS hx of cirrhosis with occasional hematuria.  Per EMS pain radiates up to left shoulder.  Pt denies lightheadedness and dizziness.  Pt arrives awake and alert, GCS 15.

## 2020-11-26 NOTE — Unmapped (Signed)
[facility]  Emergency Department Provider Note    This documentation was generated through the use of dictation and/or voice recognition software, and as such, may contain spelling or other transcription errors. Chart creation errors have been sought, but may not always be located and such creation errors, especially pronoun confusion, do NOT reflect on the standard of medical care. Any questions regarding the content of this documentation should be directed to the individual who electronically signed.      ED Clinical Impression     Final diagnoses:   Renal colic on left side (Primary)       Initial Impression, ED Course, Assessment and Plan     Impression: 65 year old gentleman with history significant for cirrhosis, HCV, prostate cancer status posttreatment, COPD, chronic Suboxone use who presents with sudden onset left flank pain.  Appears uncomfortable in the bed and has significant CVA tenderness.  Greatest suspicion for kidney stone which patient has had in the past.  Consider renal artery dissection and/or renal infarction, but higher suspicion for renal stone as above.  Consider aortic pathology, but lower concern given localization and blood pressure normal on initial exam.  Lower concern for acute infectious etiology particularly given the suddenness of onset.  Considered ACS as well particularly given patient concern, but given location and lack of other anginal symptoms very low concern at this time.  Will obtain basic labs including troponin, chemistry, CBC, and UA.  Check CT Noncon renal protocol.  We will give spot dose Dilaudid for pain control, though notably is on chronic Suboxone.    BP 123/70  - Pulse 77  - Temp 36.9 ??C (98.4 ??F)  - Resp 13  - SpO2 94%         ED Course:   ED Course as of 11/26/20 0212   Wed Nov 25, 2020   2344 Initial CBC showed unremarkable with mild macrocytosis and mild anemia similar to prior.  Remainder of labs pending due to hemolyzed initial specimen.  CT scan completed and does show 2 mm obstructing stone at the left UVJ which likely explains patient's presentation.  Will give additional dose of Dilaudid as well as Tylenol, but plan to transition to oral only pain medication.  We will follow-up redrawn labs as well as UA, but anticipate discharge home with outpatient follow-up.   Thu Nov 26, 2020   0111 ECG without acute ischemic changes and serum chemistry notable for negative troponin, normal lipase, and stable CMP compared to prior with creatinine at 1.25 only slightly above his typical baseline per review.  Pending UA at this time.  Discussed with patient plan for pain medication particularly in setting of his Suboxone use.  We will plan for discharge with small supply of oxycodone as well as instructions to alternate very low-dose Tylenol and Motrin for the next couple of days while his stone passes.   0146 UA unremarkable. Pt reports ongoing pain, but improved with analgesia. Will give additional 1x dose of Oxy prior to leaving, and discharge home with outpatient follow up. Plan discussed with the patient and his wife at bedside who are comfortable with the plan. All questions answered and return precautions provided. Discharged home in good condition.       Disposition: Discharge    Additional Medical Decision Making     I have reviewed the vital signs and the nursing notes. Labs and radiology results that were available during my care of the patient were independently reviewed by me and  considered in my medical decision making.     I staffed the case with the ED attending, Dr. Danella Sensing.    I reviewed the patient's prior medical records.  I independently visualized the radiology images.  I independently visualized the EKG tracing.     History     Chief Complaint  Abdominal Pain      HPI   Harold Richards is a 65 y.o. male with a PMH of cirrhosis, HCV, prostate cancer, COPD, chronic opioid use (Suboxone) presenting for evaluation of sudden onset left flank pain.    History obtained primarily from patient.  Patient presents via EMS after acute onset left flank pain beginning just prior to presentation.  Patient reports that he had been in his typical state of health without any significant symptoms when he suddenly began to experience severe, 10 out of 10 pain to his left flank radiating to the left groin.  He does have a history of kidney stones and this felt somewhat similar, but more severe than previously.  She endorses some nausea, but denies any vomiting, diarrhea, dysuria, hematuria, Dema, rash, chest pain, dyspnea, fever, chills, or any other symptoms.  He does have a history of prostate cancer and is pending urologic evaluation for issues with chronic dysuria and hematuria as an outpatient, but notes that the symptoms have actually been better recently.  Otherwise denies any recent changes to his medications or other issues.      Past Medical History:   Diagnosis Date   ??? Actinic keratosis    ??? Asthma    ??? Basal cell carcinoma    ??? Chronic headaches    ??? Colon polyp    ??? Enlarged prostate    ??? GERD (gastroesophageal reflux disease) 02/08/2013   ??? Hepatitis C    ??? Hx of substance abuse (CMS-HCC)     hx of recreational drug use (cocaine IV, intranasal and crack),    ??? Infectious viral hepatitis    ??? Lung nodule seen on imaging study 07/12/2016   ??? Osteoporosis    ??? Other emphysema (CMS-HCC)    ??? Renal calculus    ??? Skin cancer    ??? Tobacco abuse 02/08/2013       Patient Active Problem List   Diagnosis   ??? Chronic headaches   ??? Renal calculus   ??? Tobacco use disorder   ??? Nasal septum perforation   ??? Insomnia   ??? Other emphysema (CMS-HCC)   ??? Hepatitis C   ??? Osteoporosis of lumbar spine   ??? Proteinuria   ??? Prostate cancer (CMS-HCC)   ??? Cirrhosis (CMS-HCC)   ??? Community acquired pneumonia of right upper lobe of lung   ??? Sinusitis   ??? Neoplasm of maxillary sinus   ??? Opioid use disorder, mild, in sustained remission, on maintenance therapy (CMS-HCC)   ??? History of nonmelanoma skin cancer       Past Surgical History:   Procedure Laterality Date   ??? CERVICAL FUSION      cage   ??? PR COLSC FLX W/RMVL OF TUMOR POLYP LESION SNARE TQ N/A 10/20/2015    Procedure: COLONOSCOPY FLEX; W/REMOV TUMOR/LES BY SNARE;  Surgeon: Annie Paras, MD;  [facility];  Service: Gastroenterology   ??? PR EXCISION SUBMAXILLARY GLAND Right 03/31/2015    Procedure: EXC SUBMANDIBULAR GLAND;  Surgeon: Lauralee Evener, MD;  Location: ASC [facility];  Service: ENT   ??? PR REMV UPPER JAW-MAXILLECTOMY Left 07/22/2019  Procedure: MAXILLECTOMY; WO ORBITAL EXENTERATION;  Surgeon: Adam Swaziland Kimple, MD;  Location: ASC OR [facility];  Service: ENT   ??? PR STEREOTACTIC COMP ASSIST PROC,CRANIAL,EXTRADURAL Left 07/22/2019    Procedure: STEREOTACTIC COMPUTER-ASSISTED (NAVIGATIONAL) PROCEDURE; CRANIAL, EXTRADURAL;  Surgeon: Adam Swaziland Kimple, MD;  Location: ASC OR [facility];  Service: ENT   ??? PR UPPER GI ENDOSCOPY,DIAGNOSIS N/A 08/18/2017    Procedure: UGI ENDO, INCLUDE ESOPHAGUS, STOMACH, & DUODENUM &/OR JEJUNUM; DX W/WO COLLECTION SPECIMN, BY BRUSH OR WASH;  Surgeon: Rona Ravens, MD;  [facility];  Service: Gastroenterology   ??? PR UPPER GI ENDOSCOPY,DIAGNOSIS N/A 11/08/2019    Procedure: UGI ENDO, INCLUDE ESOPHAGUS, STOMACH, & DUODENUM &/OR JEJUNUM; DX W/WO COLLECTION SPECIMN, BY BRUSH OR WASH;  Surgeon: Janyth Pupa, MD;  [facility]: [facility];  Service: Gastroenterology   ??? SKIN BIOPSY         No current facility-administered medications for this encounter.    Current Outpatient Medications:   ???  albuterol HFA 90 mcg/actuation inhaler, Inhale 2 puffs every four (4) hours as needed for wheezing., Disp: 8 g, Rfl: 3  ???  aspirin (ECOTRIN) 81 MG tablet, Take 81 mg by mouth daily., Disp: , Rfl:   ???  atorvastatin (LIPITOR) 20 MG tablet, Take 1 tablet (20 mg total) by mouth nightly., Disp: 90 tablet, Rfl: 3  ???  cholecalciferol, vitamin D3, (VITAMIN D3) 1,000 unit capsule, Take 1,000 Units by mouth daily., Disp: , Rfl:   ???  cyanocobalamin (VITAMIN B-12) 250 MCG tablet, Take 250 mcg by mouth daily., Disp: , Rfl:   ???  ferrous sulfate 325 mg (65 mg iron) CpER, Take 1 tablet by mouth daily. , Disp: , Rfl:   ???  fluticasone-umeclidin-vilanter (TRELEGY ELLIPTA) 100-62.5-25 mcg inhaler, Inhale 1 puff daily., Disp: 90 each, Rfl: 3  ???  guaiFENesin (MUCINEX) 600 mg 12 hr tablet, Take 600 mg by mouth every morning., Disp: , Rfl:   ???  lactulose (CHRONULAC) 10 gram/15 mL solution, Take 15 mL (10 g total) by mouth Two (2) times a day., Disp: 900 mL, Rfl: 6  ???  loratadine (CLARITIN) 10 mg tablet, Take 10 mg by mouth daily., Disp: , Rfl:   ???  mirtazapine (REMERON) 7.5 MG tablet, Take 1 tablet (7.5 mg total) by mouth nightly., Disp: 30 tablet, Rfl: 6  ???  multivitamin (TAB-A-VITE/THERAGRAN) per tablet, Take 1 tablet by mouth daily., Disp: , Rfl:   ???  oxybutynin (DITROPAN XL) 5 MG 24 hr tablet, Take 1 tablet (5 mg total) by mouth daily., Disp: 30 tablet, Rfl: 11  ???  oxyCODONE (ROXICODONE) 5 MG immediate release tablet, Take 1 tablet (5 mg total) by mouth every six (6) hours as needed for pain for up to 5 days., Disp: 20 tablet, Rfl: 0  ???  rifAXIMin (XIFAXAN) 550 mg Tab, Take 1 tablet (550 mg total) by mouth Two (2) times a day., Disp: 60 tablet, Rfl: 6  ???  SUBOXONE 8-2 mg sublingual film, Place 1 Film under the tongue Two (2) times a day. , Disp: , Rfl: 0  ???  tamsulosin (FLOMAX) 0.4 mg capsule, TAKE 1 CAPSULE(0.4 MG) BY MOUTH TWICE DAILY, Disp: 180 capsule, Rfl: 1    Allergies  Bicalutamide, Penicillins, and Rocephin [ceftriaxone]    Family History   Problem Relation Age of Onset   ??? Heart attack Father    ??? Hypertension Father    ??? Lung cancer Mother    ??? Bone cancer Mother    ???  Hypertension Mother    ??? Hepatitis Sister    ??? Melanoma Neg Hx        Social History  Social History     Tobacco Use   ??? Smoking status: Current Every Day Smoker     Packs/day: 0.25     Years: 50.00 Pack years: 12.50     Types: Cigarettes     Start date: 06/16/1969   ??? Smokeless tobacco: Former Neurosurgeon     Types: Chew     Quit date: 2009   ??? Tobacco comment: PATIENT CURRENTLY USING PATCH TO HELP WITH QUITTING   Substance Use Topics   ??? Alcohol use: No     Alcohol/week: 0.0 standard drinks   ??? Drug use: Not Currently     Types: Cocaine, IV, Marijuana     Comment: History of cocaine use years ago (intranasal, IV and crack), marijuana use.        Review of Systems  Constitutional: Negative for fever.  Eyes: Negative for visual changes.  ENT: Negative for sore throat.  Cardiovascular: Negative for chest pain.  Respiratory: Negative for shortness of breath.  Gastrointestinal: Positive for left flank/groin pain and nausea. Negative for vomiting or diarrhea.  Genitourinary: Negative for dysuria.  Musculoskeletal: Negative for back pain.  Skin: Negative for rash.  Neurological: Negative for headaches, focal weakness or numbness.  10-point ROS is otherwise negative except as documented.    Physical Exam     ED Triage Vitals   Enc Vitals Group      BP       Pulse       SpO2 Pulse       Resp       Temp       Temp src       SpO2       Weight       Height       Head Circumference       Peak Flow       Pain Score       Pain Loc       Pain Edu?       Excl. in GC?        CONSTITUTIONAL: Awake. Thin. In moderate distress secondary to pain.  HEAD: Normocephalic. Atraumatic.  EYES: PERRLA. Conjunctivae clear. Sclera non-icteric.  ENT: Airway patent. MMM.  NECK: Supple without meningismus. No significant cervical LAD.   CARD: Regular rate. Regular rhythm. S1 and S2 appreciated. Normal and symmetric distal pulses present in all extremities. Cap refill < 2 secs.  RESP: No tachypnea. No increased WOB. No audible stridor or stertor. LCTAB. No wheezes or rales. No cough noted during my evaluation.   ABD/GI: Soft. Non-distended. Mild TTP over the LLQ. No rebound. No guarding.  BACK:  The back appears normal. Severe left CVA tenderness to percussion.  EXT: Normal spontaneous ROM in all joints. No effusions. No edema. Non-tender to palpation.   SKIN: Warm. Dry. Normal color for age and race. No acute lesions or obvious rash noted.  NEURO: No slurred or dysarthric speech. No gross focal neurologic deficits are appreciated.  PSYCH: The patient's mood and manner are appropriate.  Speech and behavior are normal. Grooming and personal hygiene are appropriate.    Results     Labs Reviewed   COMPREHENSIVE METABOLIC PANEL - Abnormal; Notable for the following components:       Result Value    Chloride 115 (*)     Anion Gap 4 (*)  Creatinine 1.25 (*)     Total Bilirubin <0.2 (*)     Alkaline Phosphatase 44 (*)     All other components within normal limits   URINALYSIS WITH CULTURE REFLEX - Abnormal; Notable for the following components:    Ketones, UA Trace (*)     Mucus, UA Rare (*)     All other components within normal limits   CBC W/ AUTO DIFF - Abnormal; Notable for the following components:    RBC 3.54 (*)     HGB 12.5 (*)     HCT 36.4 (*)     MCV 103.0 (*)     MCH 35.3 (*)     Absolute Lymphocytes 1.0 (*)     Macrocytosis Slight (*)     All other components within normal limits   LIPASE - Normal   HIGH SENSITIVITY TROPONIN I - SINGLE - Normal   CBC W/ DIFFERENTIAL    Narrative:     The following orders were created for panel order CBC w/ Differential.  Procedure                               Abnormality         Status                     ---------                               -----------         ------                     CBC w/ [id number]]         Abnormal            Final result                 Please view results for these tests on the individual orders.       CT Abdomen Pelvis Without Contrast (Renal Protocol)   Preliminary Result   Subtle left-sided hydroureteronephrosis secondary to a 2 mm obstructing stone in the left UVJ.             Procedures   None     Lazarus Salines, MD  Resident  11/26/20 [phone]

## 2020-12-02 ENCOUNTER — Encounter: Admit: 2020-12-02 | Discharge: 2020-12-03 | Payer: PRIVATE HEALTH INSURANCE

## 2020-12-02 MED ADMIN — zoledronic acid-mannitol&water (RECLAST) 5 mg/100 mL infusion 5 mg: 5 mg | INTRAVENOUS | @ 19:00:00 | Stop: 2020-12-02

## 2020-12-02 NOTE — Unmapped (Signed)
Pt presents for Reclast infusion.  VSS, premeds refused.  Pt aware of potential reaction/side effects, call bell within reach.  49.83 CrCL  Ca 8.8  1440 Reclast started  1456 Reclast complete.  Pt tolerated without complication, VSS.  IV flushed per policy and d/c'd, gauze and coban applied.  Pt left clinic in no acute distress.

## 2020-12-09 ENCOUNTER — Ambulatory Visit
Admit: 2020-12-09 | Discharge: 2020-12-10 | Payer: PRIVATE HEALTH INSURANCE | Attending: Internal Medicine | Primary: Internal Medicine

## 2020-12-09 DIAGNOSIS — F5105 Insomnia due to other mental disorder: Principal | ICD-10-CM

## 2020-12-09 DIAGNOSIS — R413 Other amnesia: Principal | ICD-10-CM

## 2020-12-09 DIAGNOSIS — F4321 Adjustment disorder with depressed mood: Principal | ICD-10-CM

## 2020-12-09 DIAGNOSIS — R419 Unspecified symptoms and signs involving cognitive functions and awareness: Principal | ICD-10-CM

## 2020-12-09 DIAGNOSIS — R4189 Other symptoms and signs involving cognitive functions and awareness: Principal | ICD-10-CM

## 2020-12-09 DIAGNOSIS — E611 Iron deficiency: Principal | ICD-10-CM

## 2020-12-09 MED ORDER — MIRTAZAPINE 15 MG TABLET
ORAL_TABLET | Freq: Every evening | ORAL | 3 refills | 90.00000 days | Status: CP
Start: 2020-12-09 — End: ?

## 2020-12-09 MED ORDER — DONEPEZIL 5 MG TABLET
ORAL_TABLET | Freq: Every day | ORAL | 11 refills | 30.00000 days | Status: CP
Start: 2020-12-09 — End: 2021-12-09

## 2020-12-09 MED ORDER — GABAPENTIN 100 MG CAPSULE
ORAL_CAPSULE | Freq: Every evening | ORAL | 3 refills | 90 days | Status: CP
Start: 2020-12-09 — End: 2021-12-09

## 2020-12-09 MED ORDER — FLUTICASONE PROPIONATE 50 MCG/ACTUATION NASAL SPRAY,SUSPENSION
Freq: Every evening | NASAL | 0 refills | 0 days | Status: CP
Start: 2020-12-09 — End: 2021-12-09

## 2020-12-09 NOTE — Unmapped (Addendum)
-   Follow up with your eye doctor as scheduled.  - Try your CPAP again. This can help improve the quality of your sleep.   - Try Flonase before going to sleep.   - Increase the Remeron to 15 mg.  - About two hours before goal bedtime, take melatonin 1 mg.   - For restless legs:   - Iron tests to check your iron levels. I have placed these orders, so you can go to any [facility] lab when it is convenient   - Start gabapentin 100 mg nightly. Let me know if this helps your restless legs    - After being on the gabapentin for a few weeks, start donepezil (Aricept) 5 mg in the morning.  If you notice stomach pain, diarrhea, loss of appetite, let me know.    - I have placed a referral for neuropsychological testing.    Please reach out to me on MyChart with any issues.

## 2020-12-09 NOTE — Unmapped (Signed)
[facility]:  MEMORY AND COGNITIVE CONSULTATION    Date Dec 12, 2020  Patient Name: Harold Richards  [facility] MRN #:[id number]  PCP: [name]  Requesting Physician: Andreas Newport, Eber Jones, MD  [address]  [address]  [place],  [place] [address]      Assessment / Plan :     Harold Richards is a 65 y.o. male presenting for evaluation and treatment of word finding difficulties and short term memory loss.     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  Beginning in the spring 2021, he started to notice that he would more frequently forget what he was in the middle of doing while doing it, and he got lost while driving, which prompted him to retire.  Since then, he has continued to notice worsening word finding difficulties, as well as paraphasic errors.  He will forget the steps on how to do something he is very familiar with; for example, he has forgotten how to use the gas pump 3 times.  He needs significant prompting from his wife to remind him to do some of his daily activities, such as feeding his dog.  His history is otherwise notable for very poor sleep, often only sleeping a few hours at night.  He has some symptoms of restless leg syndrome as well as nasal congestion that keep him from sleeping well.  He occasionally gets frustrated and loses his temper when he is unable to do something that he used to do, but otherwise he has no significant anxiety or depression.  His exam today was overall normal, including no asterixis.  He scored a 20 out of 30 on the [facility], and he was only able to name 32 out of 60 on the [employer].    While I do think that his sleep patterns are contributing to some of his short-term memory loss, I also suspect that there is a neurodegenerative process, given his significant word finding difficulties, paraphasic errors, and difficulties with routine tasks.  He has restless leg syndrome and poor sleep but no other REM sleep behavior disorders, hallucinations, or parkinsonism to suggest Lewy body disease, and his MRI did not show significant vascular disease.  Therefore, especially with the word finding difficulties and paraphasic errors, it is possible that this is Alzheimer's disease.  He did have to retire but otherwise remains independent in his ADLs, making this a mild cognitive impairment versus very mild dementia.  We discussed today that we would try to help his sleep as much as possible and refer him for further testing with neuropsychological evaluation.  We also decided to trial starting donepezil.      Diagnosis or Provisional Diagnosis    Date 12/09/20   Primary diagnosis   mild cognitive impairment   Less common diagnoses abc less common: none   Contributing Factors   sleep disorder   Behavioral syndrome?   none    [facility]   Discussed registry? yes, interested   Consented for registry? no     Plan  Diagnostic testing   -  serum laboratories - complete and norma    - neuropsychology - referred   - Imaging - MRI as below  Work on sleep  -  Nasal congestion - started Flonase  -  Avoid food and fluids 3 hours prior to bedtime  -  Recommended trying his CPAP again  - Increase mirtazapine to 15 mg  - For restless legs, start gabapentin 100 mg nightly and recheck iron studies  Work on Pharmacy  -He was started on oxybutynin a month ago, but he does not feel that this made any of his memory symptoms worse, and it significantly helped his urinary symptoms, so we will leave on for now.  Memory focussed medication  -Start donepezil 5 mg in the morning.  Counseled him on side effects and will check in in a month     Reviewed the differential diagnosis, plan of care in detail, as well as other elements as detailed above.  A written summary was provided to the patient, including a current medication and allergy list, problem list and plan of care. All the patient's questions and concerns were addressed to their stated satisfaction .      Return Visit Discussed: Return in about 4 months (around 04/11/2021).    HEALTH EDUCATION/HEALTH LITERACY: PATIENT EDUCATION was provided. Reviewed the differential diagnosis, plan of care in detail, as well as other elements as detailed above.      HPI and Data Summary:       Initial History 12/09/2020  I had the pleasure of seeing Harold Richards in neurologic consultation at the request of Dr. Andreas Newport, Donald Pore*  in the company of his wife [name] on Dec 12, 2020.  The patient is seen for an opinion on diagnostic testing and treatment recommendations for concerns of short term memory loss    First symptoms occurred spring 2021.   First symptom was forgetting what he was doing and misplacing items    -- -- --- --- --- -- --- --- --- ---     History:  Here with wife [name]    Forgets where keys are and they are in his pocket  Walks from bathroom to kitchen and forgets why he goes there  Word forgetting with some paraphasic errors - substituted chainsaw for pocket knife  Forgets names of things - That thing parked outside in reference to trailer  Has been happening for the past year, worse over the past 6 months  Has forgotten how to use a gas pump three times  Forgets the steps of how to do things  Still driving, got lost one time    Seen by neuro 10/13/20.     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, plays with grandchildren and sees them with their school activities  Current smoker - smokes 1 ppd x 50 years  Typical day - looks after grandchildren (2.5, 6, 8) 4 days per week, takes care of his horse, dogs, cats  Function: needs prompting to feed the dogs, sometimes forgets mid tasks what he is doing; still driving    Mood/ Psych/ Depression/ Worry/ Anxiety/ comportment/ Judgment/ Disinhibition/ Irritibility/ Apathy/ Personality Change/ Hallucinations/ Delusions/ Withdrawal/ Eating Change:  Short tempered - comes on randomly  No depression, anxiety  No personality changes  A little stress because they are buying a new  house and his mom is going to move in with them  Sometimes will think he sees something out of the corner of his eye, like shadows. No formed   No delusions    Sleep/ Sleep Apnea/ RBD/ RLS/ Insomnia/ Excessive sleepiness:  Wakes up frequently during the night and can't get back to sleep  Sleeps 3-4 hours per day, has been happening for years  Breathing is hard at night, pillows set up have helped  Oxygen saturation dropped to 88% when sleeping  Has restless legs, on iron supplementation    Motor/Gait/Falls/ Tremor/ Change in Voice:  Shakes some but not every day  Otherwise no    Brain Disease History (concussions/ stroke/ seizure/ headache, brain infection):   Chronic headaches for years and years  Otherwise no    PMH:  COPD  Prostate Cancer - s/p treatment with Lupron, now getting monitored  Iron deficiency anemia on po iron  Chronic pain  CAD  Cirrhosis - caused by hepatitis C s/p Harvoni    Medications:  Started Remeron 7.5 mg - no difference  Oxybutynin - started one month ago, urinary symptoms better memory no worse  Suboxone for chronic pain since next surgery  Used to be on Lupron for prostate cancer - off of it since November 2021    Cognitive History/ Data Review      Symptom Onset :   2021   First Symptom:   Short term memory loss   Highest Education:   10th grade   Occupation   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 No significant parenchymal volume loss by MRI    Neuropsychological Evaluation  None    Other Neuro Studies  None     Cognitive Lab Tests:  Lab Results   Component Value Date    TSH 0.953 10/13/2020      Lab Results   Component Value Date    VITAMINB12 1,281 (H) 10/13/2020        Cognitive Testing:    MMSE 21/30 with neuro March 2022        [facility] Naming Test - 32/60    Questionnaires       Date #1 - did not complete Date #2 Date #3 Date #4   FAQ       GAD-7       PHQ-9       NPI-Q2 (patient)       NPI-Q2 (caregiver distress)       ESS       STOP BANG       PSQI-modified                  Past Medical History:   Diagnosis Date   ??? Actinic keratosis    ??? Asthma    ??? Basal cell carcinoma    ??? Chronic headaches    ??? Colon polyp    ??? Enlarged prostate    ??? GERD (gastroesophageal reflux disease) 02/08/2013   ??? Hepatitis C    ??? Hx of substance abuse (CMS-HCC)     hx of recreational drug use (cocaine IV, intranasal and crack),    ??? Infectious viral hepatitis    ???  Lung nodule seen on imaging study 07/12/2016   ??? Osteoporosis    ??? Other emphysema (CMS-HCC)    ??? Renal calculus    ??? Skin cancer    ??? Tobacco abuse 02/08/2013       Medications  Outpatient Encounter Medications as of 12/09/2020   Medication Sig Dispense Refill   ??? albuterol HFA 90 mcg/actuation inhaler Inhale 2 puffs every four (4) hours as needed for wheezing. 8 g 3   ??? aspirin (ECOTRIN) 81 MG tablet Take 81 mg by mouth daily.     ??? atorvastatin (LIPITOR) 20 MG tablet Take 1 tablet (20 mg total) by mouth nightly. 90 tablet 3   ??? cholecalciferol, vitamin D3, (VITAMIN D3) 1,000 unit capsule Take 1,000 Units by mouth daily.     ??? ferrous sulfate 325 mg (65 mg iron) CpER Take 1 tablet by mouth daily.      ??? fluticasone-umeclidin-vilanter (TRELEGY ELLIPTA) 100-62.5-25 mcg inhaler Inhale 1 puff daily. 90 each 3   ??? guaiFENesin (MUCINEX) 600 mg 12 hr tablet Take 600 mg by mouth every morning.     ??? lactulose (CHRONULAC) 10 gram/15 mL solution Take 15 mL (10 g total) by mouth Two (2) times a day. 900 mL 6   ??? loratadine (CLARITIN) 10 mg tablet Take 10 mg by mouth daily.     ??? mirtazapine (REMERON) 15 MG tablet Take 1 tablet (15 mg total) by mouth nightly. 90 tablet 3   ??? multivitamin (TAB-A-VITE/THERAGRAN) per tablet Take 1 tablet by mouth daily.     ??? oxybutynin (DITROPAN XL) 5 MG 24 hr tablet Take 1 tablet (5 mg total) by mouth daily. 30 tablet 11   ??? rifAXIMin (XIFAXAN) 550 mg Tab Take 1 tablet (550 mg total) by mouth Two (2) times a day. 60 tablet 6   ??? SUBOXONE 8-2 mg sublingual film Place 1 Film under the tongue Two (2) times a day.   0   ??? tamsulosin (FLOMAX) 0.4 mg capsule TAKE 1 CAPSULE(0.4 MG) BY MOUTH TWICE DAILY 180 capsule 1   ??? [DISCONTINUED] mirtazapine (REMERON) 7.5 MG tablet Take 1 tablet (7.5 mg total) by mouth nightly. 30 tablet 6   ??? donepeziL (ARICEPT) 5 MG tablet Take 1 tablet (5 mg total) by mouth daily. 30 tablet 11   ??? fluticasone propionate (FLONASE) 50 mcg/actuation nasal spray 1 spray into each nostril nightly. 16 g 0   ??? gabapentin (NEURONTIN) 100 MG capsule Take 1 capsule (100 mg total) by mouth nightly. 90 capsule 3   ??? [EXPIRED] oxyCODONE (ROXICODONE) 5 MG immediate release tablet Take 1 tablet (5 mg total) by mouth every six (6) hours as needed for pain for up to 5 days. 20 tablet 0   ??? [DISCONTINUED] cyanocobalamin (VITAMIN B-12) 250 MCG tablet Take 250 mcg by mouth daily.       No facility-administered encounter medications on file as of 12/09/2020.       Allergies  Allergies   Allergen Reactions   ??? Bicalutamide Rash   ??? Penicillins Rash   ??? Rocephin [Ceftriaxone] Rash     Patient received in Emergency Room and reported rash to stomach and back several hours later.        Family History   Family History   Problem Relation Age of Onset   ??? Heart attack Father    ??? Hypertension Father    ??? Lung cancer Mother    ??? Bone cancer Mother    ??? Hypertension Mother    ???  Hepatitis Sister    ??? Melanoma Neg Hx        Social History   Social History     Social History Narrative   ??? Not on file       Exam  Vital Signs: BP 123/51 (BP Site: L Arm, BP Position: Sitting, BP Cuff Size: Medium)  - Pulse 56  - Ht 165.1 cm (5' 5)  - Wt 56.6 kg (124 lb 12.8 oz)  - BMI 20.77 kg/m??   HEENT: Sclera were anicteric and non-injected. Oropharynx examination revealed a Mallampati Grade II (hard and soft palate, upper portion of tonsils anduvula visible) airway.  Oropharynx findings included:some missing teeth.  Neck was supple.   Lymph: No cervical or supraclavicular lymphadenopathy.   Chest: Clear to auscultation without wheezes, crackles, or rales.  Cor: Regular rate and rhythm without murmurs, gallops, or rubs.  Ext: No cyanosis, clubbing or edema.  Neurological Exam:  Cranial Nerves: Pupils equal round and reactive to light, extraocular movments full without saccadic intrusions.  Visual fields full to confrontation.  Facial movement in the upper and lower face symmetric.  Facial sensation symmetric to light touch and temp.  Hearing intact to finger rub. Tongue and palate midline.  Sternocleidomastoid and Trapeizius strength full.  Motor: No pronator drift.  Fine finger movements rapid bilaterally.  Power exam 5/5 strength in upper and lower extremities.   No tremor, no asterixis   Sensory: Intact to light touch and vibratory stimulation in the distal extremities.  Coordination: No dysmetria on finger to nose testing though hard for him to follow directions.  No apraxia  Gait: normal    Labs  Basic Metabolic Profile   Lab Results   Component Value Date    BUN 23 11/25/2020    BUN 13 09/25/2014    CO2 23.0 11/25/2020    CO2 28 09/25/2014           CBC   Lab Results   Component Value Date    WBC 8.4 11/25/2020    WBC 6.6 09/25/2014    RBC 3.54 (L) 11/25/2020    RBC 4.25 (L) 09/25/2014    MCV 103.0 (H) 11/25/2020    MCV 100 09/25/2014    MCH 35.3 (H) 11/25/2020    MCH 34 09/25/2014    MCHC 34.3 11/25/2020    MCHC 34 09/25/2014    RDW 13.1 11/25/2020    RDW 12.8 09/25/2014           Hepatic Function   Lab Results   Component Value Date    AST 29 11/25/2020    AST 25 09/25/2014    ALT 15 11/25/2020    ALT 22 09/25/2014           Comprehensive Metabolic Profile   Lab Results   Component Value Date    BUN 23 11/25/2020    BUN 13 09/25/2014    CO2 23.0 11/25/2020    CO2 28 09/25/2014    Anion Gap 4 (L) 11/25/2020    Anion Gap 9 09/25/2014    AST 29 11/25/2020    AST 25 09/25/2014    ALT 15 11/25/2020    ALT 22 09/25/2014        Vitamin B12   Lab Results   Component Value Date    Vitamin B-12 1,281 (H) 10/13/2020    Vitamin B-12 >1,000 (H) 10/10/2019    Vitamin B-12 >1,000 (H) 02/25/2019    Vitamin B-12 >1,000 (H) 08/07/2018    Vitamin  B-12 >1,000 (H) 11/20/2017        TSH   Lab Results   Component Value Date    TSH 0.953 10/13/2020    TSH 0.83 02/08/2013        RPR   Lab Results   Component Value Date    RPR Nonreactive 10/13/2020        Folate Folate RBC   Lab Results   Component Value Date    Folate >24.0 10/13/2020     Lab Results   Component Value Date    Folate >24.0 10/13/2020        I personally spent 102 minutes face-to-face and non-face-to-face in the care of this patient, which includes all pre, intra, and post visit time on the date of service.     Author:  Osborn Coho 12/12/2020 8:05 PM    *Any errors in syntax or even information may not have been identified and edited on initial review prior to signing this note.

## 2020-12-15 ENCOUNTER — Encounter
Admit: 2020-12-15 | Discharge: 2021-01-05 | Payer: PRIVATE HEALTH INSURANCE | Attending: Radiation Oncology | Primary: Radiation Oncology

## 2020-12-15 DIAGNOSIS — Z8546 Personal history of malignant neoplasm of prostate: Principal | ICD-10-CM

## 2020-12-15 DIAGNOSIS — E611 Iron deficiency: Principal | ICD-10-CM

## 2020-12-15 DIAGNOSIS — C61 Malignant neoplasm of prostate: Principal | ICD-10-CM

## 2020-12-15 DIAGNOSIS — Z923 Personal history of irradiation: Principal | ICD-10-CM

## 2020-12-15 DIAGNOSIS — Z08 Encounter for follow-up examination after completed treatment for malignant neoplasm: Principal | ICD-10-CM

## 2020-12-15 LAB — IRON PANEL
IRON SATURATION (CALC): 46 %
IRON: 129 ug/dL
TOTAL IRON BINDING CAPACITY (CALC): 282.2 mg/dL
TRANSFERRIN: 224 mg/dL

## 2020-12-15 LAB — FERRITIN: FERRITIN: 20.8 ng/mL

## 2020-12-15 LAB — PSA: PROSTATE SPECIFIC ANTIGEN: <0.04 ng/mL (ref 0.00–4.00)

## 2020-12-15 NOTE — Unmapped (Signed)
RADIATION ONCOLOGY FOLLOW-UP VISIT NOTE     Encounter Date: 12/15/2020  Patient Name: Harold Richards  Medical Record Number: [id number]    DIAGNOSIS:  65 y.o. male with high risk prostate cancer, cT1c, Gleason 4+4=8 in 2/12 cores, PSA 4.59 s/p external beam RT (45Gy) with brachytherapy boost (brachy done 11/05/2018)    DURATION SINCE COMPLETION OF RADIOTHERAPY:  2 years, 1 month months (brachy on 11/05/2018)    Lupron #1 (3 month injection) 07/17/2018  Lupron #2 (3 month injection) 10/16/2018  Lupron #3 (3 month injection) 01/16/2019  Lupron #4 (1 month injection) 04/18/2019  Eligard #5 (3 month injection) 05/29/2019  Eligard #6 (3 month injection) 08/28/2019  Eligard #7 (3 month injection) 11/27/2019    ASSESSMENT:  Disease Status: No Evidence of Disease (NED).  PSA today  <0.04.    RECOMMENDATIONS:  1. PSA today is <0.04.     2. ADT:  Completed- last injection 11/27/2019  3. Hot flashes:  Suspect testosterone still low- discussed that last lupron would have worn off ~02/2020 and it would not be unusual to take up to a year for testosterone to recover.  If he's still having issues at our next visit we can check his testosterone levels.  4. GU: Still increased frequency, urgency from baseline.  Continue flomax, ditropan which is providing some relief.  Urology has cystoscopy scheduled 01/07/2021 to evaluate recent hematuria- likely RT cystitis although need to eval for malignancy as well   5. GI:  No issues- colonoscopy in 2017 with recommendation for next scope in 2022, discussed getting this scheduled but will defer until after cysto  6. FOLLOW-UP:  He will return in 3 months for routine follow-up and PSA check    INTERVAL HISTORY:    Overall doing well today.  At our last visit he was having issues with hematuria that have fortunately resolved.  He still has daytime frequency, urgency, nocturia ~3x.  He did have a recent episode of L sided flank pain prompting ED evaluation which demonstrated small obstructing kidney stone- no more issues since then.  He is not aware of passing the stone.  Still having hot flashes 2-3/day (stable over the past few months but much improved from prior).  No significant GI issues- occasional loose stool (On lactulose), no pain, no bleeding.    Baseline  General: Very fatigued. No ED  Urinary: On flomax, post-void residual 0 cc. Has frequency, nocturia 2-3x/night, no urgency, hematuria, dysuria.   GI: Denies loose stools, rectal pain, or bleeding.   Colonoscopy: Done 10/2015, found polyp.  Repeat in 5 years.    REVIEW OF SYSTEMS:  A comprehensive review of 10 systems was negative except for pertinent positives noted in HPI.    PAST MEDICAL HISTORY/FAMILY HISTORY/SOCIAL HISTORY:  Reviewed in EPIC    ALLERGIES/MEDICATIONS:  Reviewed in EPIC    PHYSICAL EXAM:  Vital Signs for this encounter:   BP 117/61  - Pulse 75  - Temp 36.8 ??C (98.2 ??F) (Temporal)  - Wt 56.7 kg (125 lb 1.6 oz)  - SpO2 98%  - BMI 20.82 kg/m??   Karnofsky/Lansky Performance Status: 90,  Able to carry on normal activity; minor signs or symptoms of disease (ECOG equivalent 0)  General:   No acute distress, alert and oriented X 4   Head: Normocephalic, without obvious abnormality, atraumatic  Eyes: EOMI, no scleral icterus  Lungs: Normal work of breathing  Abdomen: non-distended  Extremities: extremities normal, atraumatic, no cyanosis or edema  Lymph nodes: No  palpable supraclavicular or cervical lymphadenopathy  Neurologic: Grossly normal   Rectal: Deferred      RADIOLOGY:  No new imaging to review    Labs:    PSA   Date Value Ref Range Status   09/15/2020 <0.04 0.00 - 4.00 ng/mL Final   05/28/2020 <0.04 0.00 - 4.00 ng/mL Final   02/27/2020 <0.04 0.00 - 4.00 ng/mL Final   08/28/2019 <0.10 0.00 - 4.00 ng/mL Final   01/16/2019 <0.10 0.00 - 4.00 ng/mL Final       Rayetta Humphrey, MD  Assistant Professor  [facility] Dept of Radiation Oncology  12/15/2020

## 2020-12-16 ENCOUNTER — Encounter: Admit: 2020-12-16 | Discharge: 2020-12-17 | Payer: PRIVATE HEALTH INSURANCE

## 2020-12-16 DIAGNOSIS — F1111 Opioid abuse, in remission: Principal | ICD-10-CM

## 2020-12-16 DIAGNOSIS — N132 Hydronephrosis with renal and ureteral calculous obstruction: Principal | ICD-10-CM

## 2020-12-16 DIAGNOSIS — N179 Acute kidney failure, unspecified: Principal | ICD-10-CM

## 2020-12-16 DIAGNOSIS — N200 Calculus of kidney: Principal | ICD-10-CM

## 2020-12-16 LAB — CREATININE
CREATININE: 1.11 mg/dL — ABNORMAL HIGH
EGFR CKD-EPI AA MALE: 82 mL/min/{1.73_m2} (ref >=60)
EGFR CKD-EPI NON-AA MALE: 71 mL/min/{1.73_m2} (ref >=60)

## 2020-12-16 LAB — SODIUM: SODIUM: 145 mmol/L (ref 135–145)

## 2020-12-16 LAB — POTASSIUM: POTASSIUM: 4.3 mmol/L (ref 3.5–5.1)

## 2020-12-16 NOTE — Unmapped (Addendum)
Send me a MyChart message when you're ready for me to take over your Suboxone prescription    After your bladder scope we'll do further testing about the kidney stone.     I ordered an ultrasound of your kidneys to make sure some of the swelling has gone away after the kidney stone has likely passed.    Here is info about kidney stone prevention:    [facility]: Kidney Stone Prevention     Kidney stones can form due to one or all of the following:     1. Low fluid Intake   2. Low citrate in the urine   3. Too much salt, oxalate, and animal protein     The following dietary changes may decrease your risk of forming stones.     1. Increase your fluid intake to around 2.5 - 3 liters per day. Diluting the urine hinders the formation of stones. You should drink enough fluid to make 2 liters of urine a day. You know you are drinking enough fluid if your urine is clear.     2. Decrease your salt intake to less than 2000mg /day   This will decrease the amount of calcium excreted in your urine. Eat more fresh or frozen vegetables instead of canned. Eat less processed meats. At restaurants request your food to be prepared without salt or high sodium seasoning.     3. Limit the amount of Oxalate containing foods in your diet to 50mg  per day.   This will decrease the amount of oxalate excreted in your urine.     Nuts   Peanut Butter   Chocolate   Spinach   Rhubarb   Tea   Black Pepper   Strawberries   Tofu     4. Limit the amount of animal proteins in your diet to less than 15 oz per day.   This will decrease the amount of uric acid excreted in your urine.     Fish   Liver   Chicken   Red Meat ??? no more than 3 servings per week     5. Increase the amount of citrate in your diet.   This will decrease the amount of calcium in your urine. 1/2 cup concentrated lemon juice to 2 quarts of water. Avoid sweetened products as these may negate the benefit.     6. Eat the recommended daily allowance of calcium each day.  Calcium in your diet binds oxalate in your gut and decreases the amount excreted in your urine. Oxalate is often one of the components of kidney stones. Even though you may form calcium kidney stones decreasing the amount of calcium in your diet will lead to higher amounts of oxalate in your urine and potentially increase your risk of recurrent stone disease.

## 2020-12-16 NOTE — Unmapped (Signed)
[facility] at [facility]       Type of visit:  face to face    Reason for visit: Follow-up    Questions / Concerns that need to be addressed: Had Kidney Stones        Screening BP- 135/60        Allergies reviewed: Yes    Medication reviewed: Yes  Pended refills? No        HCDM reviewed and updated in Epic:    We are working to make sure all of our patients??? wishes are updated in Epic and part of that is documenting a Environmental health practitioner for each patient  A Health Care Decision Rodena Piety is someone you choose who can make health care decisions for you if you are not able ??? who would you most want to do this for you????  is already up to date.        BPAs completed:  PHQ2  AUDIT - Alcohol Screen  HARK - Interpersonal Violence  COPD - Inhaler Teaching      COVID-19 Vaccine Summary  Which COVID-19 Vaccine was administered  Moderna  Type:  Dates Given:  12/01/2020                   Immunization History   Administered Date(s) Administered   ??? COVID-19 VACCINE,MRNA(MODERNA)(PF)(IM) 10/07/2019, 11/05/2019, 06/21/2020, 12/01/2020   ??? Hepatitis A 07/28/2016, 01/26/2017   ??? Hepatitis A Vaccine Pediatric / Adolescent 2 Dose IM 03/05/2014   ??? Influenza Vaccine Quad (IIV4 PF) 4mo+ injectable 06/02/2014, 06/16/2016, 04/19/2019   ??? Influenza Virus Vaccine, unspecified formulation 05/18/2017, 05/24/2018, 05/25/2020   ??? PNEUMOCOCCAL POLYSACCHARIDE 23 03/05/2014   ??? TdaP 04/19/2019       __________________________________________________________________________________________    SCREENINGS COMPLETED IN FLOWSHEETS    HARK Screening  HARK Screening  Within the last year, have you been humiliated or emotionally abused in other ways by your partner or ex-partner?: No  Within the last year, have you been afraid of your partner or ex-partner?: No  Within the last year, have you been raped or forced to have any kind of sexual activity by your partner or ex-partner?: No  Within the last year, have you been kicked, hit, slapped, or otherwise physically hurt by your partner or ex-partner?: No    AUDIT  AUDIT - C Score (Part 1): 0    PHQ2  PHQ-2 Total Score : 0    PHQ9          P4 Suicidality Screener                GAD7       COPD Assessment       Falls Risk       .imcres

## 2020-12-16 NOTE — Unmapped (Signed)
[facility] - [facility]  Established Visit    Reason for Visit:  ED follow-up    1. Nephrolithiasis    2. Hydronephrosis with urinary obstruction due to renal calculus    3. AKI (acute kidney injury) (CMS-HCC)    4. Opioid use disorder, mild, in sustained remission, on maintenance therapy (CMS-HCC)               Assessment/Plans:  1. Nephrolithiasis  This is his second episode.    Provided info on kidney stone prevention  After cystoscopy, will pursue further urine testing (would need two 24-hour urine collections)  Advised can come to [facility] for treatment of acute pain if this were to reoccur.  Low doses of opioids will not likely be effective given his chronic treatment with suboxone.    2. Hydronephrosis with urinary obstruction due to renal calculus  Re-evaluate to ensure this has resolved.  - US Renal Complete; Future    3. AKI (acute kidney injury) (CMS-HCC)  In setting of renal calculus with a/w hydronephrosis. Recheck to ensure has resolved. Baseline Cr ~0.8-0.9  - Sodium; Future  - Potassium Level; Future  - Creatinine; Future    4. Opioid use disorder, mild, in sustained remission, on maintenance therapy (CMS-HCC)  On chronic suboxone.  Will send me myChart message after he has discussed with wife regarding transfer of his care to me as I am able to provide maintenance therapy for him      Return in about 3 months (around 03/18/2021).     Medication adherence and barriers to the treatment plan have been addressed. Opportunities to optimize healthy behaviors have been discussed. Patient / caregiver voiced understanding.        __________________________________________________________    HPI:  ED visit 11/25/2020 for kidney stone.  Dischargd with prescription for oxycodone.  Was referred to urology, but has not had appt.   Since that time has been seen [facility] and diagnosed with mild cognitive impairement. Was referred to neuropsychology. Mirtazapine increasd to 15mg  daily for sleep, started on gabapentin for restless leg.  Iron studies that were mildly low and recommended to continue oral iron supplementation        Medications:  Reviewed in EPIC      Physical Exam:   Vital Signs:   BP 135/60  - Pulse 65  - Temp 35.9 ??C (96.7 ??F) (Temporal)  - Ht 165.1 cm (5' 5)  - Wt 56.4 kg (124 lb 6.4 oz)  - SpO2 98%  - BMI 20.70 kg/m??   Gen: Well appearing, NAD  CV: RRR, no murmurs  Pulm: CTA bilaterally, no crackles or wheezes  GI: Soft, NTND, normal BS. No HSM.  MSK: No edema  GU: No CVA tenderness

## 2020-12-23 ENCOUNTER — Ambulatory Visit: Admit: 2020-12-23 | Discharge: 2020-12-24 | Payer: PRIVATE HEALTH INSURANCE | Attending: Family | Primary: Family

## 2020-12-23 DIAGNOSIS — K769 Liver disease, unspecified: Principal | ICD-10-CM

## 2020-12-23 DIAGNOSIS — K7460 Unspecified cirrhosis of liver: Principal | ICD-10-CM

## 2020-12-23 LAB — CBC W/ AUTO DIFF
BASOPHILS ABSOLUTE COUNT: 0.1 10*9/L (ref 0.0–0.1)
BASOPHILS RELATIVE PERCENT: 1.1 %
EOSINOPHILS ABSOLUTE COUNT: 0.1 10*9/L (ref 0.0–0.5)
EOSINOPHILS RELATIVE PERCENT: 1.5 %
HEMATOCRIT: 38.4 % — ABNORMAL LOW (ref 39.0–48.0)
HEMOGLOBIN: 13.3 g/dL (ref 12.9–16.5)
LYMPHOCYTES ABSOLUTE COUNT: 1.2 10*9/L (ref 1.1–3.6)
LYMPHOCYTES RELATIVE PERCENT: 26.8 %
MEAN CORPUSCULAR HEMOGLOBIN CONC: 34.8 g/dL (ref 32.0–36.0)
MEAN CORPUSCULAR HEMOGLOBIN: 35.1 pg — ABNORMAL HIGH (ref 25.9–32.4)
MEAN CORPUSCULAR VOLUME: 101 fL — ABNORMAL HIGH (ref 77.6–95.7)
MEAN PLATELET VOLUME: 7.4 fL (ref 6.8–10.7)
MONOCYTES ABSOLUTE COUNT: 0.4 10*9/L (ref 0.3–0.8)
MONOCYTES RELATIVE PERCENT: 8.6 %
NEUTROPHILS ABSOLUTE COUNT: 2.9 10*9/L (ref 1.8–7.8)
NEUTROPHILS RELATIVE PERCENT: 62 %
NUCLEATED RED BLOOD CELLS: 0 /100{WBCs} (ref <=4)
PLATELET COUNT: 166 10*9/L (ref 150–450)
RED BLOOD CELL COUNT: 3.8 10*12/L — ABNORMAL LOW (ref 4.26–5.60)
RED CELL DISTRIBUTION WIDTH: 12.6 % (ref 12.2–15.2)
WBC ADJUSTED: 4.6 10*9/L (ref 3.6–11.2)

## 2020-12-23 LAB — AFP TUMOR MARKER: AFP-TUMOR MARKER: 4 ng/mL (ref <=8)

## 2020-12-23 LAB — HEPATIC FUNCTION PANEL
ALBUMIN: 3.9 g/dL (ref 3.4–5.0)
ALKALINE PHOSPHATASE: 45 U/L — ABNORMAL LOW (ref 46–116)
ALT (SGPT): 17 U/L (ref 10–49)
AST (SGOT): 29 U/L (ref <=34)
BILIRUBIN DIRECT: <0.1 mg/dL (ref 0.00–0.30)
BILIRUBIN TOTAL: <0.2 mg/dL — ABNORMAL LOW (ref 0.3–1.2)
PROTEIN TOTAL: 7.1 g/dL (ref 5.7–8.2)

## 2020-12-23 LAB — BASIC METABOLIC PANEL
ANION GAP: 6 mmol/L (ref 5–14)
BLOOD UREA NITROGEN: 19 mg/dL (ref 9–23)
BUN / CREAT RATIO: 20
CALCIUM: 9.2 mg/dL (ref 8.7–10.4)
CHLORIDE: 114 mmol/L — ABNORMAL HIGH (ref 98–107)
CO2: 22.9 mmol/L (ref 20.0–31.0)
CREATININE: 0.95 mg/dL
EGFR CKD-EPI AA MALE: >90 mL/min/{1.73_m2} (ref >=60)
EGFR CKD-EPI NON-AA MALE: 85 mL/min/{1.73_m2} (ref >=60)
GLUCOSE RANDOM: 109 mg/dL (ref 70–179)
POTASSIUM: 3.8 mmol/L (ref 3.4–4.8)
SODIUM: 143 mmol/L (ref 135–145)

## 2020-12-23 LAB — PROTIME-INR
INR: 1.1
PROTIME: 12.9 s (ref 10.3–13.4)

## 2020-12-23 NOTE — Unmapped (Signed)
1. ??Laboratory studies ordered.   2. ??MRI of abdomen ordered.   3. ??Office follow up six months.   4. ??Remain on Remeron as prescribed. Start Aricept per neurologist.   5. ??Dietary habits to healthier as discussed. Drink Boost three times daily.   6. ??Any questions please let me know.   7.  Follow up with urology as scheduled    Important Contact Numbers:     GI Clinic Appointments:? [phone]  Please call the GI Clinic appointment line if you need to schedule, reschedule or cancel an appointment in clinic.? They can also answer any questions you may have about where your appointment is located and when you need to arrive.     GI Procedure Appointments:?[phone]  Please call the GI Procedures line if you need to schedule, reschedule or cancel ANY type of procedure (EGD, colonoscopy, motility testing, etc).? You can also call this number for prep instructions, etc.?     Radiology: [phone], option 3 or 4  If you are being scheduled for any type of radiology, you will need to call to receive your appointment time.? Please call this number for information.?     For emergencies after normal business hours or on weekends/holidays  Proceed to the nearest emergency room OR contact the [facility] Operator at [phone] who can page the Gastroenterology Fellow on call.     Financial Counselors:   Tracey Harries [phone]   Dorisann Frames [phone]      Pharmacy Assistance: [phone]     Nurse Line: Silvestre Moment, RN  Phone: [phone]  Main line [facility]: [phone]

## 2020-12-23 NOTE — Unmapped (Signed)
[facility] Ph [phone] Fax [phone]  Woodfin Ganja, M.D.  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 HCV treatment  ~ 2016    Presentation of Current Illness:    [name] is a 65 y.o. pleasant Caucasian gentleman who presents today for follow up care. 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 Cured of HCV dating back to 2016 (Harvoni x 12 weeks). He was treatment naive with genotype 1a. History of recreational drug use (cocaine IV, intranasal and crack), three tattoos with one being homemade, and incarceration. Fibroscan 20 kPa consistent with F4 (cirrhosis). PMH of prostate cancer (high??risk prostate cancer, cT1c, Gleason??4+4=8??in 2/12??cores, PSA 4.59 s/p external beam RT (45Gy) with brachytherapy boost (brachy done 11/05/2018), OSA with overlap COPD, basal cell carcinoma, chronic fungal infection of maxillary sinus, pulmonary nodules, nicotine dependency, chronic headaches, and renal calculus.     Interim History:  He presents alone today. He was recently seen in ED for renal calculi. He is under the care of Dr. Pershing Cox and plans to proceed with cystoscopy based on his history of prostate cancer. Patient denies evidence of having passed the stone and asymptomatic today. He's was referred to neurology service at [facility] for persistent cognitive impairment (confusion, poor memory recall, and forgetfulness) despite taking lactulose 15 ml bid and Xifaxan 550 mg bid. Noted contributing factors include sleep issues and medications, in addition to smoking and cerebrovascular risk factors. Frontotemporal involvement (speech issues and memory) or maybe parietal since he has some issues with planning and performing complex tasks. MRI of brain with and without with minor white matter signal changes. Colonoscopy 10/20/15 showing 1 small adenoma- 5 yr follow-up. EGD 08/2017 essentially unremarkable. Denies any jaundice, pruritis, SOB, CP, increased abdominal girth, LE edema, melena, BRBPR, confusion, vomiting, or diarrhea. Adherence with CPAP use.     Finding of two 3 mm pulmonary nodules on CT scan lung for lung cancer screening (07/11/2016) in the setting of history of tobacco abuse. CT scan of chest performed 07/21/2017 stable unchanged subcentimeter pulmonary nodules. CT scan of chest 03/09/2020: Right upper lobe nodular pulmonary parenchymal opacity, unchanged. CT chest 09/09/2020 - The nodular lesion with surrounding architectural distortion in the upper lobe of the right lung is minimally different relative to imaging dating back to September 11, 2019. CT scan chest 09/09/20: The nodular lesion with surrounding architectural distortion in the upper lobe of the right lung is minimally different relative to imaging dating back to September 11, 2019. However, it is new relative to more remote prior imaging. The finding may simply reflects nodular scarring, however, given the patient's history of smoking it does warrant close imaging surveillance. Recommend repeat CT scan of the chest in 6 months. DUE August 2022.    Echocardiogram 11/17/2017: Performed for indication of bradycardia.     Interpretation Summary     ?? Normal left ventricular systolic function, ejection fraction 60 to 65%  ?? Dilated left atrium - mild  ?? Normal right ventricular systolic function  ?? Tricuspid regurgitation - mild  ?? Mildly elevated right atrial pressure     External ECG-48 to 21 day findings:  Conclusion: ??1 run of non-sustained Ventricular Tachycardia occurred lasting 6 beats with a max rate of 112 bpm (avg 93 bpm). ??37 Supraventricular Tachycardia runs occurred, most  consistent with paroxysmal atrial tachycardia; the run with the fastest interval lasting 13 beats with a max rate of 207 bpm, the longest lasting 8 beats with an avg rate of 104 bpm.    NM Myocardial perfusion spect multiple: 11/2018:   Normal myocardial perfusion study. No evidence for significant ischemia or scar is noted. Post stress: Global systolic function is hyperdynamic. The ejection fraction was greater than 65%. Coronary calcifications are noted. Mild bibasilar dependent atelectasis    Cirrhosis Care:  -- Varices screen: normal platelets and spleen size with Fibroscan < 25 kPa. No history of EGD.  -- HCC screen:   MRI of abdomen 09/09/2020: Mild left hepatic lobe hypertrophy, similar prior.The following focal hepatic lesions are identified:  -1.5 cm segment 4 arterially enhancing lesion which demonstrates isointensity on delayed imaging, similar prior (12:22). LR-2.   -1 cm segment 3 lesion with peripheral enhancement, similar prior (12:17). There is no evidence of washout or delayed capsular enhancement. LR-2.  -Several foci of arterial enhancement throughout the right liver measuring up to 0.9 cm, slightly decreased in size from prior (12:47). There is no washout delayed capsular enhancement. LR-2. Spleen unremarkable. No ascites.     -- Immunity hepatitis B.   -- Immunity hepatitis A: Series completed.   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  -- OLT status: Not indicated at this time based on low MELD score  -- MELD score: 7, Child-Pugh: 5 , Class: A   MELD-Na score: 7 at 09/22/2020  9:01 AM  MELD score: 7 at 09/22/2020  9:01 AM  Calculated from:  Serum Creatinine: 0.88 mg/dL (Using min of 1 mg/dL) at 2/95/6213  0:86 AM  Serum Sodium: 142 mmol/L (Using max of 137 mmol/L) at 09/22/2020  9:01 AM  Total Bilirubin: 0.3 mg/dL (Using min of 1 mg/dL) at 5/78/4696  2:95 AM  INR(ratio): 1.14 at 09/22/2020  9:01 AM  Age: 40 years     Stable score of 7     -- Fibroscan 06/16/2014: 20.0 kPa/F4  --Chronic hepatitis C, genotype 1a  Pre-treatment HCV RNA:  14474 IU/mL  HCV RNA 08/23/2014 - TW #6/12 - non-detectable  HCV RNA 09/24/2014 - EOT- non-detectable  HCV RNA SVR 01/01/2015 - -not detected  HCV RNA 01/26/2017 - not detected    ROS:  All systems reviewed and negative except in HPI.     Allergies:   Allergies   Allergen Reactions   ??? Bicalutamide Rash   ??? Penicillins Rash   ??? Rocephin [Ceftriaxone] Rash     Patient received in Emergency Room and reported rash to stomach and back several hours later.      Medications:  Current Outpatient Medications   Medication Sig Dispense Refill   ??? albuterol HFA 90 mcg/actuation inhaler Inhale 2 puffs every four (4) hours as needed for wheezing. 8 g 3   ??? aspirin (ECOTRIN) 81 MG tablet Take 81 mg by mouth daily.     ??? atorvastatin (LIPITOR) 20 MG tablet Take 1 tablet (20 mg total) by mouth nightly. 90 tablet 3   ??? cholecalciferol, vitamin D3, (VITAMIN D3) 1,000 unit capsule Take 1,000 Units by mouth daily.     ??? donepeziL (ARICEPT) 5 MG tablet Take 1 tablet (5 mg total) by mouth daily. 30 tablet 11   ??? ferrous sulfate 325 mg (65 mg iron) CpER Take 1 tablet by mouth daily.      ??? fluticasone propionate (FLONASE) 50 mcg/actuation nasal spray 1 spray into each nostril nightly. 16  g 0   ??? fluticasone-umeclidin-vilanter (TRELEGY ELLIPTA) 100-62.5-25 mcg inhaler Inhale 1 puff daily. 90 each 3   ??? gabapentin (NEURONTIN) 100 MG capsule Take 1 capsule (100 mg total) by mouth nightly. 90 capsule 3   ??? guaiFENesin (MUCINEX) 600 mg 12 hr tablet Take 600 mg by mouth every morning.     ??? lactulose (CHRONULAC) 10 gram/15 mL solution Take 15 mL (10 g total) by mouth Two (2) times a day. 900 mL 6   ??? loratadine (CLARITIN) 10 mg tablet Take 10 mg by mouth daily.     ??? mirtazapine (REMERON) 15 MG tablet Take 1 tablet (15 mg total) by mouth nightly. 90 tablet 3   ??? multivitamin (TAB-A-VITE/THERAGRAN) per tablet Take 1 tablet by mouth daily.     ??? oxybutynin (DITROPAN XL) 5 MG 24 hr tablet Take 1 tablet (5 mg total) by mouth daily. 30 tablet 11   ??? rifAXIMin (XIFAXAN) 550 mg Tab Take 1 tablet (550 mg total) by mouth Two (2) times a day. 60 tablet 6   ??? SUBOXONE 8-2 mg sublingual film Place 1 Film under the tongue Two (2) times a day.   0   ??? tamsulosin (FLOMAX) 0.4 mg capsule TAKE 1 CAPSULE(0.4 MG) BY MOUTH TWICE DAILY 180 capsule 1     No current facility-administered medications for this visit.     Active Ambulatory Problems     Diagnosis Date Noted   ??? Chronic headaches    ??? Renal calculus    ??? Tobacco use disorder 02/08/2013   ??? Nasal septum perforation 02/12/2013   ??? Insomnia 02/13/2013   ??? Other emphysema (CMS-HCC)    ??? Hepatitis C 03/05/2014   ??? 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 Proteinuria 09/28/2017   ??? Prostate cancer (CMS-HCC) 09/11/2018   ??? Cirrhosis (CMS-HCC) 03/14/2019   ??? Community acquired pneumonia of right upper lobe of lung 04/07/2019   ??? Sinusitis 06/24/2019   ??? 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 Opioid use disorder, mild, in sustained remission, on maintenance therapy (CMS-HCC) 02/25/2020   ??? History of nonmelanoma skin cancer 10/14/2020     Resolved Ambulatory Problems     Diagnosis Date Noted   ??? Chest pain 02/07/2013   ??? GERD (gastroesophageal reflux disease) 02/08/2013   ??? Skin lesion 02/12/2013   ??? Sialolithiasis of submandibular gland 04/08/2015   ??? Rib pain on left side 06/16/2016   ??? Health care maintenance 06/16/2016   ??? Nonspecific L Lung micronodules, repeat LDCT due 07/12/2017 07/12/2016   ??? Atherosclerosis of coronary artery 08/16/2016   ??? Actinic keratosis 02/01/2017   ??? Elevated PSA 09/28/2017   ??? Benign localized hyperplasia of prostate with urinary obstruction 09/28/2017   ??? Abnormal prostate exam 09/28/2017   ??? Hepatic encephalopathy (CMS-HCC) 03/14/2019     Past Medical History:   Diagnosis Date   ??? Asthma    ??? Basal cell carcinoma    ??? Colon polyp    ??? Enlarged prostate    ??? Hx of substance abuse (CMS-HCC)    ??? Infectious viral hepatitis    ??? Osteoporosis    ??? Skin cancer    ??? Tobacco abuse 02/08/2013     Past Surgical History:   Procedure Laterality Date   ??? CERVICAL FUSION      cage   ??? PR COLSC FLX W/RMVL OF TUMOR POLYP LESION SNARE TQ N/A 10/20/2015    Procedure: COLONOSCOPY FLEX; W/REMOV TUMOR/LES BY SNARE;  Surgeon:  Annie Paras, MD;  [facility];  Service: Gastroenterology   ??? PR EXCISION SUBMAXILLARY GLAND Right 03/31/2015    Procedure: EXC SUBMANDIBULAR GLAND;  Surgeon: Lauralee Evener, MD;  Location: ASC OR [facility];  Service: ENT   ??? PR REMV UPPER JAW-MAXILLECTOMY Left 07/22/2019    Procedure: MAXILLECTOMY; WO ORBITAL EXENTERATION;  Surgeon: Adam Swaziland Kimple, MD;  Location: ASC OR [facility];  Service: ENT   ??? PR STEREOTACTIC COMP ASSIST PROC,CRANIAL,EXTRADURAL Left 07/22/2019    Procedure: STEREOTACTIC COMPUTER-ASSISTED (NAVIGATIONAL) PROCEDURE; CRANIAL, EXTRADURAL;  Surgeon: Adam Swaziland Kimple, MD;  Location: ASC OR [facility];  Service: ENT   ??? PR UPPER GI ENDOSCOPY,DIAGNOSIS N/A 08/18/2017    Procedure: UGI ENDO, INCLUDE ESOPHAGUS, STOMACH, & DUODENUM &/OR JEJUNUM; DX W/WO COLLECTION SPECIMN, BY BRUSH OR WASH;  Surgeon: Rona Ravens, MD;  [facility]: [facility];  Service: Gastroenterology   ??? PR UPPER GI ENDOSCOPY,DIAGNOSIS N/A 11/08/2019    Procedure: UGI ENDO, INCLUDE ESOPHAGUS, STOMACH, & DUODENUM &/OR JEJUNUM; DX W/WO COLLECTION SPECIMN, BY BRUSH OR WASH;  Surgeon: Janyth Pupa, MD;  Location: GI [facility];  Service: Gastroenterology   ??? SKIN BIOPSY       Family History   Problem Relation Age of Onset   ??? Heart attack Father    ??? Hypertension Father    ??? Lung cancer Mother    ??? Bone cancer Mother    ??? Hypertension Mother    ??? Hepatitis Sister    ??? Melanoma Neg Hx       Social History     Socioeconomic History   ??? Marital status: Married     Spouse name: Not on file   ??? Number of children: Not on file   ??? Years of education: Not on file   ??? Highest education level: Not on file   Occupational History   ??? Occupation: retired   Tobacco Use   ??? Smoking status: Current Every Day Smoker     Packs/day: 0.25     Years: 50.00     Pack years: 12.50     Types: Cigarettes Start date: 06/16/1969   ??? Smokeless tobacco: Former Neurosurgeon     Types: Chew     Quit date: 2009   Substance and Sexual Activity   ??? Alcohol use: No     Alcohol/week: 0.0 standard drinks   ??? Drug use: Not Currently     Types: Cocaine, IV, Marijuana     Comment: History of cocaine use years ago (intranasal, IV and crack), marijuana use.    ??? Sexual activity: Yes     Partners: Female   Other Topics Concern   ??? Do you use sunscreen? No   ??? Tanning bed use? No   ??? Are you easily burned? No   ??? Excessive sun exposure? Yes   ??? Blistering sunburns? Yes   Social History Narrative   ??? Not on file     Social Determinants of Health     Financial Resource Strain: Not on file   Food Insecurity: Not on file   Transportation Needs: Not on file   Physical Activity: Not on file   Stress: Not on file   Social Connections: Not on file     Physical Examination:   BP 122/55  - Pulse 69  - Temp 36.8 ??C (98.2 ??F) (Temporal)  - Resp 16  - Wt 55.8 kg (123 lb)  - SpO2 95%  - BMI 20.47 kg/m??  General appearance - Alert, no distress, oriented to person, place, and time. Evidence of temporal wasting. Slender. Very pleasant.   Mental status - Normal mood, behavior, speech, dress, motor activity, and thought processes.   Eyes - pupils equal and reactive, sclera anicteric  Neck - Supple, no JVD  Chest - clear to auscultation, no wheezes, rales or rhonchi, symmetric air entry  Heart - normal rate, regular rhythm, normal S1, S2, no murmurs, rubs, clicks or gallops  Abdomen - soft, nontender, nondistended, no masses or organomegaly  Neurological - screening mental status exam normal. No evidence of asterixis  Skin - normal coloration. No evidence of palmar erythema.      Laboratory Studies:  Results for orders placed or performed in visit on 12/23/20   PT-INR   Result Value Ref Range    PT 12.9 10.3 - 13.4 sec    INR 1.10    Hepatic Function Panel   Result Value Ref Range    Albumin 3.9 3.4 - 5.0 g/dL    Total Protein 7.1 5.7 - 8.2 g/dL    Total Bilirubin <1.6 (L) 0.3 - 1.2 mg/dL    Bilirubin, Direct <1.09 0.00 - 0.30 mg/dL    AST 29 <=60 U/L    ALT 17 10 - 49 U/L    Alkaline Phosphatase 45 (L) 46 - 116 U/L   Basic metabolic panel   Result Value Ref Range    Sodium 143 135 - 145 mmol/L    Potassium 3.8 3.4 - 4.8 mmol/L    Chloride 114 (H) 98 - 107 mmol/L    CO2 22.9 20.0 - 31.0 mmol/L    Anion Gap 6 5 - 14 mmol/L    BUN 19 9 - 23 mg/dL    Creatinine 4.54 0.98 - 1.10 mg/dL    BUN/Creatinine Ratio 20     EGFR CKD-EPI Non-African American, Male 48 >=60 mL/min/1.71m2    EGFR CKD-EPI African American, Male >90 >=60 mL/min/1.35m2    Glucose 109 70 - 179 mg/dL    Calcium 9.2 8.7 - 11.9 mg/dL   CBC w/ Differential   Result Value Ref Range    WBC 4.6 3.6 - 11.2 10*9/L    RBC 3.80 (L) 4.26 - 5.60 10*12/L    HGB 13.3 12.9 - 16.5 g/dL    HCT 14.7 (L) 82.9 - 48.0 %    MCV 101.0 (H) 77.6 - 95.7 fL    MCH 35.1 (H) 25.9 - 32.4 pg    MCHC 34.8 32.0 - 36.0 g/dL    RDW 56.2 13.0 - 86.5 %    MPV 7.4 6.8 - 10.7 fL    Platelet 166 150 - 450 10*9/L    nRBC 0 <=4 /100 WBCs    Neutrophils % 62.0 %    Lymphocytes % 26.8 %    Monocytes % 8.6 %    Eosinophils % 1.5 %    Basophils % 1.1 %    Absolute Neutrophils 2.9 1.8 - 7.8 10*9/L    Absolute Lymphocytes 1.2 1.1 - 3.6 10*9/L    Absolute Monocytes 0.4 0.3 - 0.8 10*9/L    Absolute Eosinophils 0.1 0.0 - 0.5 10*9/L    Absolute Basophils 0.1 0.0 - 0.1 10*9/L     Assessment/Plan:   [name] is a 65 y.o. Caucasian male with decompensated HCV cirrhosis: PMH of cirrhosis with HE which appears controlled with total 30 ml of lactulose daily and Xifaxan 550 mg bid. His two biggest complaints which remain are fatigue  and memory recall disturbance. From his last visit altered cognitive status continued to be a concern. Patient was referred to neurology and felt contributing factors include sleep issues and medications, in addition to smoking and cerebrovascular risk factors. Frontotemporal involvement (speech issues and memory) or maybe parietal since he has some issues with planning and performing complex tasks. MRI of brain with and without with minor white matter signal changes. He was instructed to start Remeron 15 mg at bedtime and after three weeks to add Aricept 5 mg daily. He is currently two weeks into having started Remeron and tolerating medication well. He is sleeping better. He will start Aricept as instructed next week. There is no history of an overt decompensated event such as upper GI bleed or ascites. Fibroscan is consistent with F4 disease. Continued evidence of good synthetic liver function. Stable MELD score of 7.     Recently emergency department visit for renal calculi. Doing better at this time. Under the care of Dr. Pershing Cox. Personal history of prostate cancer.      ~ Lab ordered   ~ Office follow up six months   ~ Avoid alcohol use.     HE: Continue lactulose 15 ml bid and Xifaxan 550 mg bid. Goal of two to three bowel movements daily.    HCC screening ~Up to date. History of LRAD-2 and LRAD- 3 lesions in the setting of cirrhosis. Warrants surveillance every six months. Will repeat MRI of abdomen in August.     Osteoporosis evaluation ~Under the care of endocrinology at [facility].     Tobacco abuse, continued ~ Smoking cessation discussed again.     Pulmonary nodules ~ History of pulmonary nodule in the setting of tobacco use. Will proceed with CT scan of chest in August as well based on recommendation of close monitoring. New order placed again.    Skin cancer ~ Under the care of dermatology.     Nutritional status ~ Continue with high caloric diet with continuation of at least three cans of Boost daily. Noted couple of pound weight loss.     Variceal Screening~ No history of varices. Repeat upper endoscopy not necessarily recommended if remains well compensated given PLT count >150 and fibroscan <25 kPa.    History of Bradycardia with evidence of VTach as noted above ~Under the care of cardiology.     History of prostate cancer ~ Under the care of oncology. Treatment inclusive of Lupron injections and radiation treatment.     Renal calculi: Under the care of Dr. Pershing Cox.    Cognitive Disturbance: Under the care of neurology. Encouraged to continue Remeron 15 mg every day and start Aricept 5 mg one tablet daily in one week.     All patient's questions were answered to their satisfaction during visit today.      Rodman Key, DNP, FNP-BC  [facility]  [id number]  [place] [address]  Phone [phone]

## 2020-12-23 NOTE — Unmapped (Signed)
[facility] Refill Coordination Note    Specialty Medication(s) to be Shipped:   Infectious Disease: Xifaxan    Other medication(s) to be shipped: No additional medications requested for fill at this time     [name], DOB: 07-06-1956  Phone: [phone] (home)       All above HIPAA information was verified with patient's family member, joanna.     Was a Nurse, learning disability used for this call? No    Completed refill call assessment today to schedule patient's medication shipment from the [facility] [phone]).  All relevant notes have been reviewed.     Specialty medication(s) and dose(s) confirmed: Regimen is correct and unchanged.   Changes to medications: [name] reports no changes at this time.  Changes to insurance: No  New side effects reported not previously addressed with a pharmacist or physician: None reported  Questions for the pharmacist: No    Confirmed patient received a Conservation officer, historic buildings and a Surveyor, mining with first shipment. The patient will receive a drug information handout for each medication shipped and additional FDA Medication Guides as required.       DISEASE/MEDICATION-SPECIFIC INFORMATION        N/A    SPECIALTY MEDICATION ADHERENCE     Medication Adherence    Patient reported X missed doses in the last month: 0  Specialty Medication: xifaxan 550mg               Were doses missed due to medication being on hold? No    Unable to confirm quantity on hand    REFERRAL TO PHARMACIST     Referral to the pharmacist: Not needed      [facility]     Shipping address confirmed in Epic.     Delivery Scheduled: Yes, Expected medication delivery date: 5/20.     Medication will be delivered via Next Day Courier to the prescription address in Epic WAM.    [name]   [facility] Specialty Technician

## 2020-12-24 MED FILL — XIFAXAN 550 MG TABLET: ORAL | 30 days supply | Qty: 60 | Fill #2

## 2021-01-01 ENCOUNTER — Ambulatory Visit: Admit: 2021-01-01 | Discharge: 2021-01-02 | Payer: PRIVATE HEALTH INSURANCE

## 2021-01-07 ENCOUNTER — Encounter: Admit: 2021-01-07 | Discharge: 2021-01-08 | Payer: PRIVATE HEALTH INSURANCE

## 2021-01-07 DIAGNOSIS — R350 Frequency of micturition: Principal | ICD-10-CM

## 2021-01-07 DIAGNOSIS — Z466 Encounter for fitting and adjustment of urinary device: Principal | ICD-10-CM

## 2021-01-07 MED ADMIN — sodium chloride irrigation (NS) 0.9 % irrigation solution 500 mL: 500 mL | @ 18:00:00 | Stop: 2021-01-07

## 2021-01-07 MED ADMIN — lidocaine 2% gel (XYLOCAINE) jelly urojet 20 mL: 20 mL | URETHRAL | @ 18:00:00 | Stop: 2021-01-07

## 2021-01-07 NOTE — Unmapped (Signed)
Procedures  64yM h/o prostate ca s/p xrt with gross hematuria    CT negative    Voided cytology negative    Cystoscopy (male)  Time out was performed immediately prior to the procedure.    The patient was prepped and draped in the usual sterile fashion.  Flexible cystosopy was performed.  The urethra was normal.  The bladder urothelium was without abnormality.  The ureteral orifices were visualized bilaterally, and efflux was clear.  Saline barbotage was not performed for cytology.  The patient tolerated the procedure well and was not given one dose of antibiotic prophylaxis afterwards.    Specimen:  none    Plan:  RTC PRN

## 2021-01-07 NOTE — Unmapped (Signed)
Scope: [id number]

## 2021-01-07 NOTE — Unmapped (Signed)
[facility]:  Taking Care of Yourself After Cystoscopy Procedures    *Drink plenty of water for a day or two following your procedure.  Try to have about 8 ounces (one cup) at a time, and do this 6 times or more per day.  (If you have fluid restrictions, please ask the nurse or doctor for advice).    *AVOID alcoholic, carbonated and caffeinated drinks for a day or two, as they may cause uncomfortable symptoms.    *For the first 8 hours after the procedure, your urine may be pink or red in color.  Small clots or a few drops of blood can be a normal side effect of the instruments.  Large amounts of bleeding or difficulty urinating are not normal.  Call your doctor if this happens.    *You may experience some mild discomfort of a burning sensation with urination after having this procedure.  If it does not improve, or if other symptoms appear (fever, chills, or difficulty emptying), call your doctor.    *You may return to normal daily activities such as work, school, driving, exercising and housework.    *If your doctor gave you a prescription, take it as ordered.    *If you need a return appointment, the secretary will make it for you when you check out. To contact the Urology Clinic during business hours, call [phone].    *[facility] Operator can be reached at [phone] if you need to get in contact with your doctor.  After the hours, the operator can page the doctor on call for urgent concerns:    Urology Patients should ask for the Urology resident “on call”.    You can get more immediate assistance at the Emergency Room or Urgent Care if necessary.

## 2021-01-12 DIAGNOSIS — J438 Other emphysema: Principal | ICD-10-CM

## 2021-01-12 MED ORDER — TRELEGY ELLIPTA 100 MCG-62.5 MCG-25 MCG POWDER FOR INHALATION
0 refills | 0 days | Status: CP
Start: 2021-01-12 — End: ?

## 2021-01-17 ENCOUNTER — Encounter: Payer: Self-pay | Admitting: Emergency Medicine

## 2021-01-17 ENCOUNTER — Ambulatory Visit
Admission: EM | Admit: 2021-01-17 | Discharge: 2021-01-17 | Disposition: A | Discharge status: Home / Self Care | Payer: BC Managed Care – PPO | Attending: Physician Assistant | Admitting: Physician Assistant

## 2021-01-17 ENCOUNTER — Other Ambulatory Visit: Payer: Self-pay

## 2021-01-17 ENCOUNTER — Ambulatory Visit (INDEPENDENT_AMBULATORY_CARE_PROVIDER_SITE_OTHER): Payer: BC Managed Care – PPO

## 2021-01-17 DIAGNOSIS — R509 Fever, unspecified: Secondary | ICD-10-CM | POA: Diagnosis not present

## 2021-01-17 DIAGNOSIS — R519 Headache, unspecified: Secondary | ICD-10-CM | POA: Insufficient documentation

## 2021-01-17 DIAGNOSIS — R21 Rash and other nonspecific skin eruption: Secondary | ICD-10-CM | POA: Diagnosis not present

## 2021-01-17 DIAGNOSIS — R059 Cough, unspecified: Secondary | ICD-10-CM | POA: Diagnosis not present

## 2021-01-17 DIAGNOSIS — Z20822 Contact with and (suspected) exposure to covid-19: Secondary | ICD-10-CM | POA: Insufficient documentation

## 2021-01-17 DIAGNOSIS — S30861A Insect bite (nonvenomous) of abdominal wall, initial encounter: Secondary | ICD-10-CM

## 2021-01-17 DIAGNOSIS — W57XXXA Bitten or stung by nonvenomous insect and other nonvenomous arthropods, initial encounter: Secondary | ICD-10-CM | POA: Diagnosis not present

## 2021-01-17 DIAGNOSIS — R5383 Other fatigue: Secondary | ICD-10-CM | POA: Diagnosis present

## 2021-01-17 MED ORDER — DOXYCYCLINE HYCLATE 100 MG PO CAPS: 100.0000 mg | ORAL_CAPSULE | Freq: Two times a day (BID) | ORAL | 0 refills | Status: AC

## 2021-01-17 MED ORDER — KETOROLAC TROMETHAMINE 60 MG/2ML IM SOLN
60.0000 mg | Freq: Once | INTRAMUSCULAR | Status: AC
  Administered 2021-01-17: 60 mg via INTRAMUSCULAR

## 2021-01-17 MED ORDER — TRIAMCINOLONE ACETONIDE 0.5 % EX OINT: 1.0000 "application " | TOPICAL_OINTMENT | Freq: Three times a day (TID) | CUTANEOUS | 0 refills | Status: AC

## 2021-01-17 NOTE — ED Triage Notes (Signed)
Patient states that he might have had a tick bite.  Patient reports rash to his back and headache that started on Friday.

## 2021-01-17 NOTE — ED Provider Notes (Signed)
[facility]    CSN: [id number] Arrival date & time: 01/17/21  1516      History   Chief Complaint Chief Complaint  Patient presents with   Rash   Headache   Insect Bite    tick    HPI Vincent Walters is a 65 y.o. male presenting for approximately 3-day history of headache, loose stools, fatigue and rashes.  Patient states that he pulled about 5 ticks off of him over the past couple of weeks and is unsure if that is related.  He says he is taking BC powder for his headache and it has not helped.  Patient denies any dizziness, weakness, numbness/tingling, facial drooping, confusion, vision changes, chest pain or breathing difficulty.  He has a history of COPD and has chronic cough and occasional wheezing.  He denies any worsening of this recently.  He states that he is trying not to cough because it hurts his head when he does cough.  Patient says he has had headaches like this before.  Is not the worst when he is ever had.  He denies any sick contacts and no known exposure to COVID-19.  Vaccinated for COVID-19 x4.  Patient is mostly concerned about possible tickborne illnesses today.  He is denying any joint swelling and has not had any bull's-eye rashes.  He has apparently had a low-grade fever to 99.1.  No other concerns.   Other PMH significant for cirrhosis, prostate cancer status post treatment with Lupron, chronic pain with current suboxone use, tobacco abuse, memory loss and neurocognitive disorder (recently started on Donepezil last month).   HPI  Past Medical History:  Diagnosis Date   Chronic pain    COPD (chronic obstructive pulmonary disease) (HCC)     There are no problems to display for this patient.   Past Surgical History:  Procedure Laterality Date   NECK SURGERY     X 2       Home Medications    Prior to Admission medications   Medication Sig Start Date End Date Taking? Authorizing Provider  albuterol (PROVENTIL HFA;VENTOLIN HFA) 108 (90  BASE) MCG/ACT inhaler Inhale 2 puffs into the lungs every 6 (six) hours as needed for wheezing or shortness of breath.   Yes [provider]  atorvastatin (LIPITOR) 20 MG tablet Take 1 tablet by mouth daily. 10/19/20  Yes [provider]  buprenorphine-naloxone (SUBOXONE) 2-0.5 MG SUBL SL tablet Place 1 tablet under the tongue daily.   Yes [provider]  cholecalciferol (VITAMIN D) 1000 UNITS tablet Take 1,000 Units by mouth daily.   Yes [provider]  donepezil (ARICEPT) 5 MG tablet Take 5 mg by mouth daily. 01/12/21  Yes [provider]  doxycycline (VIBRAMYCIN) 100 MG capsule Take 1 capsule (100 mg total) by mouth 2 (two) times daily for 10 days. 01/17/21 01/27/21 Yes Eusebio Friendly B, PA-C  ergocalciferol (VITAMIN D2) 50000 UNITS capsule Take 50,000 Units by mouth once a week.   Yes [provider]  Fluticasone-Salmeterol (ADVAIR) 250-50 MCG/DOSE AEPB Inhale 1 puff into the lungs 2 (two) times daily.   Yes [provider]  gabapentin (NEURONTIN) 100 MG capsule Take by mouth. 12/09/20 12/09/21 Yes [provider]  mirtazapine (REMERON) 15 MG tablet Take by mouth. 12/09/20  Yes [provider]  Multiple Vitamin (MULTI-VITAMINS) TABS Take 1 tablet by mouth daily.   Yes [provider]  oxybutynin (DITROPAN-XL) 5 MG 24 hr tablet Take 1 tablet by mouth daily.  11/06/20 11/06/21 Yes [provider]  rifaximin (XIFAXAN) 550 MG TABS tablet Take by mouth. 10/27/20  Yes [provider]  tamsulosin (FLOMAX) 0.4 MG CAPS capsule TAKE 1 CAPSULE(0.4 MG) BY MOUTH TWICE DAILY 10/26/20  Yes [provider]  TRELEGY ELLIPTA 100-62.5-25 MCG/INH AEPB Inhale 1 puff into the lungs daily. 01/12/21  Yes [provider]  triamcinolone ointment (KENALOG) 0.5 % Apply 1 application topically 3 (three) times daily. 01/17/21  Yes Shirlee Latch, PA-C  Ganciclovir (ZIRGAN) 0.15 % GEL Place 1 drop into the right eye 5  (five) times daily. 02/04/15   Lutricia Feil, PA-C    Family History History reviewed. No pertinent family history.  Social History Social History   Tobacco Use   Smoking status: Every Day    Packs/day: 1.00    Years: 40.00    Pack years: 40.00    Types: Cigarettes   Smokeless tobacco: Never  Vaping Use   Vaping Use: Never used  Substance Use Topics   Alcohol use: No    Alcohol/week: 0.0 standard drinks   Drug use: No     Allergies   Bicalutamide, Ceftriaxone, and Penicillins   Review of Systems Review of Systems  Constitutional:  Positive for fatigue. Negative for fever.  HENT:  Negative for congestion, rhinorrhea, sinus pressure, sinus pain and sore throat.   Respiratory:  Positive for cough. Negative for shortness of breath.   Cardiovascular:  Negative for chest pain.  Gastrointestinal:  Positive for diarrhea. Negative for abdominal pain, nausea and vomiting.  Musculoskeletal:  Negative for myalgias.  Skin:  Positive for rash.  Neurological:  Positive for headaches. Negative for dizziness, weakness and light-headedness.  Hematological:  Negative for adenopathy.    Physical Exam Triage Vital Signs ED Triage Vitals  Enc Vitals Group     BP 01/17/21 1539 121/72     Pulse Rate 01/17/21 1539 77     Resp 01/17/21 1539 16     Temp 01/17/21 1539 99.1 F (37.3 C)     Temp Source 01/17/21 1539 Oral     SpO2 01/17/21 1539 96 %     Weight 01/17/21 1536 119 lb 14.9 oz (54.4 kg)     Height 01/17/21 1536  (1.676 m)     Head Circumference --      Peak Flow --      Pain Score 01/17/21 1536 9     Pain Loc --      Pain Edu? --      Excl. in GC? --    No data found.  Updated Vital Signs BP 121/72 (BP Location: Left Arm)   Pulse 77   Temp 99.1 F (37.3 C) (Oral)   Resp 16   Ht  (1.676 m)   Wt 119 lb 14.9 oz (54.4 kg)   SpO2 96%   BMI 19.36 kg/m       Physical Exam Vitals and nursing note reviewed.  Constitutional:      General: He is not in  acute distress.    Appearance: Normal appearance. He is well-developed. He is not ill-appearing or diaphoretic.  HENT:     Head: Normocephalic and atraumatic.     Nose: Nose normal.     Mouth/Throat:     Mouth: Mucous membranes are moist.     Pharynx: Oropharynx is clear. Uvula midline. No oropharyngeal exudate.     Tonsils: No tonsillar abscesses.  Eyes:     General: No scleral icterus.  Right eye: No discharge.        Left eye: No discharge.     Extraocular Movements: Extraocular movements intact.     Conjunctiva/sclera: Conjunctivae normal.     Pupils: Pupils are equal, round, and reactive to light.  Neck:     Thyroid: No thyromegaly.     Trachea: No tracheal deviation.  Cardiovascular:     Rate and Rhythm: Normal rate and regular rhythm.     Heart sounds: Normal heart sounds.  Pulmonary:     Effort: Pulmonary effort is normal. No respiratory distress.     Breath sounds: Wheezing (few scattered wheezes throughout) present. No rhonchi or rales.  Musculoskeletal:     Cervical back: Normal range of motion and neck supple.  Lymphadenopathy:     Cervical: No cervical adenopathy.  Skin:    General: Skin is warm and dry.     Findings: Rash (faint erythematous macular rash bilateral lower back. There is also an area of erythema of the left lower abdomen with scab consistent with tick bite site and inflammation) present.  Neurological:     General: No focal deficit present.     Mental Status: He is alert and oriented to person, place, and time. Mental status is at baseline.     Cranial Nerves: No cranial nerve deficit.     Motor: No weakness.     Gait: Gait normal.  Psychiatric:        Mood and Affect: Mood normal.        Behavior: Behavior normal.        Thought Content: Thought content normal.     UC Treatments / Results  Labs (all labs ordered are listed, but only abnormal results are displayed) Labs Reviewed  SARS CORONAVIRUS 2 (TAT 6-24 HRS)     EKG   Radiology DG Chest 2 View  Result Date: 01/17/2021 CLINICAL DATA:  Cough EXAM: CHEST - 2 VIEW COMPARISON:  None. FINDINGS: The cardiomediastinal silhouette is normal in contour. No pleural effusion. No pneumothorax. No acute pleuroparenchymal abnormality. Visualized abdomen is unremarkable. Mild degenerative changes of the thoracic spine. IMPRESSION: No acute cardiopulmonary abnormality. Electronically Signed   By: Meda Klinefelter MD   On: 01/17/2021 16:30    Procedures Procedures (including critical care time)  Medications Ordered in UC Medications  ketorolac (TORADOL) injection 60 mg (60 mg Intramuscular Given 01/17/21 1627)    Initial Impression / Assessment and Plan / UC Course  I have reviewed the triage vital signs and the nursing notes.  Pertinent labs & imaging results that were available during my care of the patient were reviewed by me and considered in my medical decision making (see chart for details).  65 year old male presenting for multiple complaints including fatigue, headaches, low-grade fevers, rashes and multiple tick bites.  Symptoms ongoing for the past 3 to 4 days.  Temperature in clinic is 99.1 degrees.  The rest of vital signs are normal as well.  On exam he is well-appearing but is complaining of headache.  Cranial nerve exam is normal.  Patient given 60 mg IM ketorolac in the clinic for his headache.  Additionally on his exam he does have an area of inflammation/tick bite site of the left lower abdomen.  There is no bull's-eye type rash.  Possible early infection.  There is also a mild erythematous rash of his lower back that is faint.  He has some wheezing of his chest.  COVID test obtained given his cough and some of  his symptoms.  Current CDC guidelines, isolation protocol and ED precautions reviewed with patient.  Chest x-ray performed today since he has a low-grade fever, cough and fatigue.  Chest x-ray independently viewed by me.  X-ray is  normal.  Reviewed this with patient and his wife.  Suspect it is possible he could have tickborne illness.  Treating at this time with doxycycline.  Also prescribed triamcinolone ointment for the tick bite site.  The doxycycline will also help if this area is starting to get infected and potentially also beneficial if he is starting to have a COPD exacerbation.  Advised close follow-up with his PCP if symptoms or not improving.  Also, advise follow-up with the neurologist if his headaches continue.  He did have an MRI performed last month since he has ongoing cognitive issues.   MRI of brain performed last month: "Minor white matter signal changes (Fazekas scale as above). No significant parenchymal volume loss by MRI"  Reviewed ED red flag signs and symptoms with patient and his wife.  Advised to go to ED for any worsening of his headaches if or if he has any strokelike symptoms.  They agree.  Final Clinical Impressions(s) / UC Diagnoses   Final diagnoses:  Fatigue, unspecified type  Acute nonintractable headache, unspecified headache type  Rash  Tick bite of abdominal wall, initial encounter  Low grade fever     Discharge Instructions      It is possible he could have a tickborne illness given her symptoms.  I have sent in doxycycline to help treat this possibility.  Have also sent in a corticosteroid ointment to apply to the tick bite site to help with inflammation.  The doxycycline will also help with the tick bite site is starting to get infected.  Additionally, this is a good medication that is often used for COPD exacerbations.  You do have some increased wheezing on your exam.  It is fine for you to take over-the-counter NSAIDs and Tylenol for your headache.  Try to rest increase your fluid intake.  COVID test has been obtained.  Someone will call you if your result is positive and go from there.  Please contact your PCP for appointment if you are not starting to feel better in the  next couple of days.  If you have any severe acute worsening of your symptoms need to go to the emergency department.  You have received COVID testing today either for positive exposure, concerning symptoms that could be related to COVID infection, screening purposes, or re-testing after confirmed positive.  Your test obtained today checks for active viral infection in the last 1-2 weeks. If your test is negative now, you can still test positive later. So, if you do develop symptoms you should either get re-tested and/or isolate x 5 days and then strict mask use x 5 days (unvaccinated) or mask use x 10 days (vaccinated). Please follow CDC guidelines.  While Rapid antigen tests come back in 15-20 minutes, send out PCR/molecular test results typically come back within 1-3 days. In the mean time, if you are symptomatic, assume this could be a positive test and treat/monitor yourself as if you do have COVID.   We will call with test results if positive. Please download the MyChart app and set up a profile to access test results.   If symptomatic, go home and rest. Push fluids. Take Tylenol as needed for discomfort. Gargle warm salt water. Throat lozenges. Take Mucinex DM or Robitussin for cough.  Humidifier in bedroom to ease coughing. Warm showers. Also review the COVID handout for more information.  COVID-19 INFECTION: The incubation period of COVID-19 is approximately 14 days after exposure, with most symptoms developing in roughly 4-5 days. Symptoms may range in severity from mild to critically severe. Roughly 80% of those infected will have mild symptoms. People of any age may become infected with COVID-19 and have the ability to transmit the virus. The most common symptoms include: fever, fatigue, cough, body aches, headaches, sore throat, nasal congestion, shortness of breath, nausea, vomiting, diarrhea, changes in smell and/or taste.    COURSE OF ILLNESS Some patients may begin with mild disease  which can progress quickly into critical symptoms. If your symptoms are worsening please call ahead to the Emergency Department and proceed there for further treatment. Recovery time appears to be roughly 1-2 weeks for mild symptoms and 3-6 weeks for severe disease.   GO IMMEDIATELY TO ER FOR FEVER YOU ARE UNABLE TO GET DOWN WITH TYLENOL, BREATHING PROBLEMS, CHEST PAIN, FATIGUE, LETHARGY, INABILITY TO EAT OR DRINK, ETC  QUARANTINE AND ISOLATION: To help decrease the spread of COVID-19 please remain isolated if you have COVID infection or are highly suspected to have COVID infection. This means -stay home and isolate to one room in the home if you live with others. Do not share a bed or bathroom with others while ill, sanitize and wipe down all countertops and keep common areas clean and disinfected. Stay home for 5 days. If you have no symptoms or your symptoms are resolving after 5 days, you can leave your house. Continue to wear a mask around others for 5 additional days. If you have been in close contact (within 6 feet) of someone diagnosed with COVID 19, you are advised to quarantine in your home for 14 days as symptoms can develop anywhere from 2-14 days after exposure to the virus. If you develop symptoms, you  must isolate.  Most current guidelines for COVID after exposure -unvaccinated: isolate 5 days and strict mask use x 5 days. Test on day 5 is possible -vaccinated: wear mask x 10 days if symptoms do not develop -You do not necessarily need to be tested for COVID if you have + exposure and  develop symptoms. Just isolate at home x10 days from symptom onset During this global pandemic, CDC advises to practice social distancing, try to stay at least 62ft away from others at all times. Wear a face covering. Wash and sanitize your hands regularly and avoid going anywhere that is not necessary.  KEEP IN MIND THAT THE COVID TEST IS NOT 100% ACCURATE AND YOU SHOULD STILL DO EVERYTHING TO PREVENT  POTENTIAL SPREAD OF VIRUS TO OTHERS (WEAR MASK, WEAR GLOVES, WASH HANDS AND SANITIZE REGULARLY). IF INITIAL TEST IS NEGATIVE, THIS MAY NOT MEAN YOU ARE DEFINITELY NEGATIVE. MOST ACCURATE TESTING IS DONE 5-7 DAYS AFTER EXPOSURE.   It is not advised by CDC to get re-tested after receiving a positive COVID test since you can still test positive for weeks to months after you have already cleared the virus.   *If you have not been vaccinated for COVID, I strongly suggest you consider getting vaccinated as long as there are no contraindications.       ED Prescriptions     Medication Sig Dispense Auth. Provider   doxycycline (VIBRAMYCIN) 100 MG capsule Take 1 capsule (100 mg total) by mouth 2 (two) times daily for 10 days. 20 capsule Shirlee Latch, PA-C  triamcinolone ointment (KENALOG) 0.5 % Apply 1 application topically 3 (three) times daily. 30 g Gareth Morgan      PDMP not reviewed this encounter.   Shirlee Latch, PA-C 01/17/21 1645

## 2021-01-17 NOTE — Discharge Instructions (Addendum)
It is possible he could have a tickborne illness given her symptoms.  I have sent in doxycycline to help treat this possibility.  Have also sent in a corticosteroid ointment to apply to the tick bite site to help with inflammation.  The doxycycline will also help with the tick bite site is starting to get infected.  Additionally, this is a good medication that is often used for COPD exacerbations.  You do have some increased wheezing on your exam.  It is fine for you to take over-the-counter NSAIDs and Tylenol for your headache.  Try to rest increase your fluid intake.  COVID test has been obtained.  Someone will call you if your result is positive and go from there.  Please contact your PCP for appointment if you are not starting to feel better in the next couple of days.  If you have any severe acute worsening of your symptoms need to go to the emergency department.  You have received COVID testing today either for positive exposure, concerning symptoms that could be related to COVID infection, screening purposes, or re-testing after confirmed positive.  Your test obtained today checks for active viral infection in the last 1-2 weeks. If your test is negative now, you can still test positive later. So, if you do develop symptoms you should either get re-tested and/or isolate x 5 days and then strict mask use x 5 days (unvaccinated) or mask use x 10 days (vaccinated). Please follow CDC guidelines.  While Rapid antigen tests come back in 15-20 minutes, send out PCR/molecular test results typically come back within 1-3 days. In the mean time, if you are symptomatic, assume this could be a positive test and treat/monitor yourself as if you do have COVID.   We will call with test results if positive. Please download the MyChart app and set up a profile to access test results.   If symptomatic, go home and rest. Push fluids. Take Tylenol as needed for discomfort. Gargle warm salt water. Throat lozenges. Take  Mucinex DM or Robitussin for cough. Humidifier in bedroom to ease coughing. Warm showers. Also review the COVID handout for more information.  COVID-19 INFECTION: The incubation period of COVID-19 is approximately 14 days after exposure, with most symptoms developing in roughly 4-5 days. Symptoms may range in severity from mild to critically severe. Roughly 80% of those infected will have mild symptoms. People of any age may become infected with COVID-19 and have the ability to transmit the virus. The most common symptoms include: fever, fatigue, cough, body aches, headaches, sore throat, nasal congestion, shortness of breath, nausea, vomiting, diarrhea, changes in smell and/or taste.    COURSE OF ILLNESS Some patients may begin with mild disease which can progress quickly into critical symptoms. If your symptoms are worsening please call ahead to the Emergency Department and proceed there for further treatment. Recovery time appears to be roughly 1-2 weeks for mild symptoms and 3-6 weeks for severe disease.   GO IMMEDIATELY TO ER FOR FEVER YOU ARE UNABLE TO GET DOWN WITH TYLENOL, BREATHING PROBLEMS, CHEST PAIN, FATIGUE, LETHARGY, INABILITY TO EAT OR DRINK, ETC  QUARANTINE AND ISOLATION: To help decrease the spread of COVID-19 please remain isolated if you have COVID infection or are highly suspected to have COVID infection. This means -stay home and isolate to one room in the home if you live with others. Do not share a bed or bathroom with others while ill, sanitize and wipe down all countertops and keep common  areas clean and disinfected. Stay home for 5 days. If you have no symptoms or your symptoms are resolving after 5 days, you can leave your house. Continue to wear a mask around others for 5 additional days. If you have been in close contact (within 6 feet) of someone diagnosed with COVID 19, you are advised to quarantine in your home for 14 days as symptoms can develop anywhere from 2-14 days  after exposure to the virus. If you develop symptoms, you  must isolate.  Most current guidelines for COVID after exposure -unvaccinated: isolate 5 days and strict mask use x 5 days. Test on day 5 is possible -vaccinated: wear mask x 10 days if symptoms do not develop -You do not necessarily need to be tested for COVID if you have + exposure and  develop symptoms. Just isolate at home x10 days from symptom onset During this global pandemic, CDC advises to practice social distancing, try to stay at least 53ft away from others at all times. Wear a face covering. Wash and sanitize your hands regularly and avoid going anywhere that is not necessary.  KEEP IN MIND THAT THE COVID TEST IS NOT 100% ACCURATE AND YOU SHOULD STILL DO EVERYTHING TO PREVENT POTENTIAL SPREAD OF VIRUS TO OTHERS (WEAR MASK, WEAR GLOVES, WASH HANDS AND SANITIZE REGULARLY). IF INITIAL TEST IS NEGATIVE, THIS MAY NOT MEAN YOU ARE DEFINITELY NEGATIVE. MOST ACCURATE TESTING IS DONE 5-7 DAYS AFTER EXPOSURE.   It is not advised by CDC to get re-tested after receiving a positive COVID test since you can still test positive for weeks to months after you have already cleared the virus.   *If you have not been vaccinated for COVID, I strongly suggest you consider getting vaccinated as long as there are no contraindications.

## 2021-01-18 LAB — SARS CORONAVIRUS 2 (TAT 6-24 HRS): SARS Coronavirus 2: NEGATIVE

## 2021-01-18 NOTE — Unmapped (Signed)
[facility] Refill Coordination Note    Specialty Medication(s) to be Shipped:   Infectious Disease: Xifaxan    Other medication(s) to be shipped: No additional medications requested for fill at this time     Harold Richards, DOB: 1955-10-19  Phone: [phone] (home)       All above HIPAA information was verified with patient.     Was a Nurse, learning disability used for this call? No    Completed refill call assessment today to schedule patient's medication shipment from the [facility] [phone]).  All relevant notes have been reviewed.     Specialty medication(s) and dose(s) confirmed: Regimen is correct and unchanged.   Changes to medications: Harold Richards reports no changes at this time.  Changes to insurance: No  New side effects reported not previously addressed with a pharmacist or physician: None reported  Questions for the pharmacist: No    Confirmed patient received a Conservation officer, historic buildings and a Surveyor, mining with first shipment. The patient will receive a drug information handout for each medication shipped and additional FDA Medication Guides as required.       DISEASE/MEDICATION-SPECIFIC INFORMATION        N/A    SPECIALTY MEDICATION ADHERENCE     Medication Adherence    Patient reported X missed doses in the last month: 0  Specialty Medication: xifaxan 550mg   Patient is on additional specialty medications: No  Patient is on more than two specialty medications: No  Informant: patient              Were doses missed due to medication being on hold? No    xifaxan 550 mg: 9 days of medicine on hand       REFERRAL TO PHARMACIST     Referral to the pharmacist: Not needed      [facility]     Shipping address confirmed in Epic.     Delivery Scheduled: Yes, Expected medication delivery date: 01/22/21.     Medication will be delivered via Next Day Courier to the prescription address in Epic [place].    Harold Richards   [facility] Specialty Technician

## 2021-01-21 MED FILL — XIFAXAN 550 MG TABLET: ORAL | 30 days supply | Qty: 60 | Fill #3

## 2021-01-28 NOTE — Unmapped (Signed)
Called patient's wife to follow up on how he is doing on the donepezil. Only started it two to three weeks ago. Has not noticed any difference but also no side effects.   Will schedule neuropsych testing.

## 2021-02-07 MED ORDER — TAMSULOSIN 0.4 MG CAPSULE
ORAL_CAPSULE | 1 refills | 0 days
Start: 2021-02-07 — End: ?

## 2021-02-10 NOTE — Unmapped (Signed)
Received refill request for Tamsulosin 0.4mg . Sent to Dr. Carles Collet for review.

## 2021-02-11 MED ORDER — TAMSULOSIN 0.4 MG CAPSULE
ORAL_CAPSULE | Freq: Two times a day (BID) | ORAL | 1 refills | 90 days | Status: CP
Start: 2021-02-11 — End: ?

## 2021-02-17 DIAGNOSIS — J438 Other emphysema: Principal | ICD-10-CM

## 2021-02-17 MED ORDER — TRELEGY ELLIPTA 100 MCG-62.5 MCG-25 MCG POWDER FOR INHALATION
0 refills | 0 days
Start: 2021-02-17 — End: ?

## 2021-02-17 NOTE — Unmapped (Signed)
[facility] Refill Coordination Note    Specialty Medication(s) to be Shipped:   Infectious Disease: Xifaxan    Other medication(s) to be shipped: No additional medications requested for fill at this time     Harold Richards, DOB: [birth year]  Phone: [phone] (home)       All above HIPAA information was verified with patient.     Was a Nurse, learning disability used for this call? No    Completed refill call assessment today to schedule patient's medication shipment from the [facility] [phone]).  All relevant notes have been reviewed.     Specialty medication(s) and dose(s) confirmed: Regimen is correct and unchanged.   Changes to medications: Harold Richards reports no changes at this time.  Changes to insurance: No  New side effects reported not previously addressed with a pharmacist or physician: None reported  Questions for the pharmacist: No    Confirmed patient received a Conservation officer, historic buildings and a Surveyor, mining with first shipment. The patient will receive a drug information handout for each medication shipped and additional FDA Medication Guides as required.       DISEASE/MEDICATION-SPECIFIC INFORMATION        N/A    SPECIALTY MEDICATION ADHERENCE     Medication Adherence    Patient reported X missed doses in the last month: 0  Specialty Medication: xifaxan 550mg   Patient is on additional specialty medications: No  Patient is on more than two specialty medications: No  Any gaps in refill history greater than 2 weeks in the last 3 months: no  Demonstrates understanding of importance of adherence: yes  Informant: patient  Reliability of informant: reliable  Provider-estimated medication adherence level: good  Patient is at risk for Non-Adherence: No              Were doses missed due to medication being on hold? No    xifaxan 550 mg: 7 days of medicine on hand         REFERRAL TO PHARMACIST     Referral to the pharmacist: Not needed      [facility]     Shipping address confirmed in Epic.     Delivery Scheduled: Yes, Expected medication delivery date: 07/15.     Medication will be delivered via Next Day Courier to the prescription address in Epic WAM.    Harold Richards   [facility] Specialty Technician

## 2021-02-18 DIAGNOSIS — G9340 Encephalopathy, unspecified: Principal | ICD-10-CM

## 2021-02-18 NOTE — Unmapped (Signed)
Harold Richards 's [facility] shipment will be delayed as a result of prior authorization being required by the patient's insurance.     I have reached out to the patient  at [phone] and communicated the delay. We will call the patient back to reschedule the delivery upon resolution. We have not confirmed the new delivery date.

## 2021-02-19 MED FILL — XIFAXAN 550 MG TABLET: ORAL | 30 days supply | Qty: 60 | Fill #4

## 2021-02-19 NOTE — Unmapped (Signed)
Update:    PA approved for Xifaxan.  [name] will receive today via the Same Day Courier.  Delivery date confirmed with [name].    Corliss Skains. [place], [place].D.  Specialty Pharmacist  [facility]  [phone] option 4

## 2021-03-03 ENCOUNTER — Ambulatory Visit
Admit: 2021-03-03 | Discharge: 2021-03-04 | Payer: PRIVATE HEALTH INSURANCE | Attending: Clinical Neuropsychologist | Primary: Clinical Neuropsychologist

## 2021-03-03 DIAGNOSIS — R4189 Other symptoms and signs involving cognitive functions and awareness: Principal | ICD-10-CM

## 2021-03-03 DIAGNOSIS — R419 Unspecified symptoms and signs involving cognitive functions and awareness: Principal | ICD-10-CM

## 2021-03-04 NOTE — Unmapped (Signed)
[facility]                      Cognitive Testing    Assessment/Plan:   Harold Richards is a 65 y.o., White or Caucasian race, Not Hispanic or Latino ethnicity,  ENGLISH speaking male who was [facility] for neurologic evaluation by Barbie Haggis, MD. This cognitive testing was done as part of a multidisciplinary workup for evaluation of neurodegenerative disease/cognitive impairment. This was done in person by Dwana Curd, PhD. These results will be combined with review of records and information gathered during physician interview after this testing. See that note for more history and integration of information.    Briefly, [name] saw Dr. Knox Saliva on 12/09/20. 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 He noted increasing paraphasic errors and forgetting how to do things. He had very poor sleep as well. He scored impaired on a cognitive screen (MoCA = 20/30) and was very impaired on confrontation naming (BNT = 32/60). Dr. Knox Saliva had concern for a neurodegenerative disease, but he did not have signs of Lewy Body disease and his MRI did not show significant cerebrovascular disease. Alzheimer's disease was a possibility. He was still independent in iADLs so it fit with mild cognitive impairment. Donepezil was started.     Summary of Findings:  [name] presentation is quite unusual. Validity indicators were mixed but somewhat borderline, though this would be realistic for someone with genuine, very severe impairment. The rest of his performance is consistent with severe impairment, but he is reportedly still functioning adequately in independent living skills, which seems surprising given his scores. On testing, his performance was moderately to severely impaired overall, but with a very high level of variability. He scored intact on tests of orientation, spatial perception and confrontation naming (which was also surprising since he was severely impaired on that with Dr. Knox Saliva). He was more consistently impaired on tests of memory (with an especially unusual performance on a list-learning task, with a negative learning curve), executive functions, attention, and processing speed.     At this point, 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 It is possible that extremely poor sleep along with pain and various medical issues are together causing an encephalopathy and result in the high level of variability we see here. It does not fit typical Alzheimer's presentation, but he could theoretically have a very atypical version of Alzheimer's (with young age of onset, more frontal presentation, and more rapid progression than usual). Other causes of severe, relatively rapid (going from unimpaired to severely impaired over the course of a year) impairment need to be tested for, so I strongly recommend any additional workup to characterize this as the top priority. First, although his MRI reading only mentioned a lack of atrophy or white matter disease, it looked unusual to me, with various small dark spots not typically seen on brain scans, that were not mentioned in the reading. This is outside my expertise, so I recommend Dr. Knox Saliva take another look at his MRI or possibly consult another neurologist to look into it as well. They should also consider whether an LP, different type of imaging (e.g. PET, MRI with another  type of contrast) or other tests (rule out paraneoplastic syndrome, infection, etc) would be helpful.       Testing Results     TEST SCORES:      Note: This summary of test scores accompanies the interpretive report and should not be considered in isolation without reference to the appropriate sections in the text. Descriptors are based on appropriate normative data and may be adjusted based on clinical judgment. The terms ???impaired??? and ???within normal limits (WNL)??? are used when a more specific level of functioning cannot be determined.         Validity Testing:   DESCRIPTOR         Medical Symptom Validity Test (MSVT) --- --- Genuine Severe Memory Impairment Profile   WAIS-IV Reliable Digit Span: --- --- Below Expectation   HVLT-R Recognition Discrimination Index: --- --- Below Expectation         Overall Cognitive Status:             Raw Score     MoCA Orientation 5 /6 Intact   Current and Past Two [place] Presidents 3 /3 Intact         Memory:            Hopkins Verbal Learning Test (HVLT-R), Form 4: Raw Score (T Score) Percentile         Trial 1 5          Trial 2 3          Trial 3 3     Total Trials 1-3 11/36 (11) <1 Exceptionally Low   Delayed Recall 2/12 (9) <1 Exceptionally Low   Recognition Discrimination Index 1 (-28) <1 Exceptionally Low   True Positives 6 --- ---   False Positives 5 --- ---         [place] 21 Raw Score Percentile    Immediate (verbatim) 9 4 Well Below Average   Immediate (paraphrase) 6 1 Exceptionally Low   Delayed (verbatim) 6 4 Well Below Average   Delayed (paraphrase) 5 2 Exceptionally Low         Benson Figure Raw Score Percentile    Delayed Reproduction 7 7 Well Below Average   Delayed Recognition 0 --- Impaired         Attention/Executive Function:            Trail Making Test Raw Score (T Score) Percentile    Part A 132 secs., 0 errors (18) <1 Exceptionally Low   Part B discontinued - unable to complete <1 Exceptionally Low         WAIS-IV Digit Span: Scaled Score Percentile    Forward 3,  longest span (4) 1 Exceptionally Low   Backward 6,  longest span (3) 9 Below Average         Language:            Verbal Fluency Tasks Raw Score Percentile    COWA (FAS Letters) 15 3 Well Below Average        F 4 A 5          S 6     Animal Naming 11 5 Well Below Average          Raw Score     CERAD Naming 13 /15 Average         Visuospatial/Visuoconstruction:             Raw Score Percentile    MMSE Pentagons Copy 1 /1 Intact  Teena Dunk Figure Copy 12/17 --- Exceptionally Low   RBANS Line Orienation 16 26-50 Average   Visual Agnosia Items 7 /7 Intact           Informed Consent and Coding/Compliance     [name] was provided with a verbal description of the nature and purpose of the present neuropsychological evaluation. Also reviewed were the foreseeable risks and/or discomforts and benefits of the procedure, limits of confidentiality, and mandatory reporting requirements of this provider. The patient was given the opportunity to ask questions and receive answers about the evaluation. Oral consent to participate was provided by the patient.     This evaluation was conducted by Dwana Curd, Ph.D., ABPP(CN), licensed neuropsychologist. Dr. Tiburcio Pea spent 30 minutes administering tests, billed [phone]. As a separate ad distinct service at a separate time, [employer], [place], psychometrist, spent 60 minutes administering and scoring tests, billed as one unit [phone] and 1 unit [phone]. Dr. Tiburcio Pea spent a total of 50 minutes in interpretation, report writing, and providing feedback to the referral source, billed as one unit [phone].

## 2021-03-12 ENCOUNTER — Ambulatory Visit: Admit: 2021-03-12 | Discharge: 2021-03-13 | Payer: PRIVATE HEALTH INSURANCE

## 2021-03-12 ENCOUNTER — Encounter: Admit: 2021-03-12 | Discharge: 2021-03-13 | Payer: PRIVATE HEALTH INSURANCE

## 2021-03-12 MED ADMIN — gadobenate dimeglumine (MULTIHANCE) 529 mg/mL (0.1mmol/0.2mL) solution 6 mL: 6 mL | INTRAVENOUS | @ 12:00:00 | Stop: 2021-03-12

## 2021-03-12 MED ADMIN — iohexoL (OMNIPAQUE) 350 mg iodine/mL solution 75 mL: 75 mL | INTRAVENOUS | @ 13:00:00 | Stop: 2021-03-12

## 2021-03-12 NOTE — Unmapped (Signed)
[facility] Refill Coordination Note    Specialty Medication(s) to be Shipped:   Infectious Disease: Xifaxan    Other medication(s) to be shipped: No additional medications requested for fill at this time     Harold Richards, DOB: 07-31-1956  Phone: [phone] (home)       All above HIPAA information was verified with patient.     Was a Nurse, learning disability used for this call? No    Completed refill call assessment today to schedule patient's medication shipment from the [facility] [phone]).  All relevant notes have been reviewed.     Specialty medication(s) and dose(s) confirmed: Regimen is correct and unchanged.   Changes to medications: Harold Richards reports no changes at this time.  Changes to insurance: No  New side effects reported not previously addressed with a pharmacist or physician: None reported  Questions for the pharmacist: No    Confirmed patient received a Conservation officer, historic buildings and a Surveyor, mining with first shipment. The patient will receive a drug information handout for each medication shipped and additional FDA Medication Guides as required.       DISEASE/MEDICATION-SPECIFIC INFORMATION        N/A    SPECIALTY MEDICATION ADHERENCE     Medication Adherence    Specialty Medication: xifaxan 550mg               Were doses missed due to medication being on hold? No    xifaxan 550 mg: 8 days of medicine on hand       REFERRAL TO PHARMACIST     Referral to the pharmacist: Not needed      [facility]     Shipping address confirmed in Epic.     Delivery Scheduled: Yes, Expected medication delivery date: 08/10.     Medication will be delivered via Next Day Courier to the prescription address in Epic WAM.    [name]   [facility] Specialty Technician

## 2021-03-16 NOTE — Unmapped (Signed)
Harold Richards 's [name] shipment will be delayed as a result of the medication is too soon to refill until 8/14.     I have reached out to the patient  at [phone] and communicated the delivery change. We will reschedule the medication for the delivery date that the patient agreed upon.  We have confirmed the delivery date as 8/15

## 2021-03-22 MED FILL — XIFAXAN 550 MG TABLET: ORAL | 30 days supply | Qty: 60 | Fill #5

## 2021-03-23 ENCOUNTER — Ambulatory Visit: Admit: 2021-03-23 | Payer: PRIVATE HEALTH INSURANCE | Attending: Radiation Oncology | Primary: Radiation Oncology

## 2021-03-23 DIAGNOSIS — C61 Malignant neoplasm of prostate: Principal | ICD-10-CM

## 2021-03-23 LAB — TESTOSTERONE: TESTOSTERONE TOTAL: 155 ng/dL — ABNORMAL LOW

## 2021-03-23 LAB — PSA: PROSTATE SPECIFIC ANTIGEN: <0.04 ng/mL (ref 0.00–4.00)

## 2021-03-23 NOTE — Unmapped (Signed)
Here to fu with dr Carles Collet. States he has recovered from tick bite that gave him a rash all over.   Continues on oxybutinin twice a day and flomax twice a day.

## 2021-03-23 NOTE — Unmapped (Signed)
RADIATION ONCOLOGY FOLLOW-UP VISIT NOTE     Encounter Date: 03/23/2021  Patient Name: [name]  Medical Record Number: [id number]    DIAGNOSIS:  65 y.o. male with high risk prostate cancer, cT1c, Gleason 4+4=8 in 2/12 cores, PSA 4.59 s/p external beam RT (45Gy) with brachytherapy boost (brachy done 11/05/2018)    DURATION SINCE COMPLETION OF RADIOTHERAPY:  2 years, 4 month months (brachy on 11/05/2018)    Lupron #1 (3 month injection) 07/17/2018  Lupron #2 (3 month injection) 10/16/2018  Lupron #3 (3 month injection) 01/16/2019  Lupron #4 (1 month injection) 04/18/2019  Eligard #5 (3 month injection) 05/29/2019  Eligard #6 (3 month injection) 08/28/2019  Eligard #7 (3 month injection) 11/27/2019    ASSESSMENT:  Disease Status: No Evidence of Disease (NED).  PSA today  <0.04.    RECOMMENDATIONS:  1. PSA today is <0.04.     2. ADT:  Completed- last injection 11/27/2019  3. Hot flashes:  Suspect testosterone still low- discussed that last lupron would have worn off ~02/2020 and it would not be unusual to take 1-1.5 years to recover.  Will check testosterone today to get a baseline sense of recovery.  4. GU: Still increased frequency, urgency from baseline.  Continue flomax, ditropan which is providing some relief.  Had some issues with hematuria in in early 2022 but cystoscopy was negative for bladder changes/masses- may be related to kidney stones  5. GI:  No issues- colonoscopy in 2017 with recommendation for next scope in 2022- will order today.  6. FOLLOW-UP:  He will return in 3 months for routine follow-up and PSA check    INTERVAL HISTORY:    Overall doing well today without any major changes.  Urination is stable with flomax and ditropan- nocturia 2-3x, mild daytime frequency, rare urgency and urge incontinence.  No GI issues including pain or bleeding.  Still having hot flashes 3-4/day (unchanged from prior).  Energy is still low.  Recently downsized houses and is waiting for some repairs before moving in. Otherwise enjoying retirement.    Baseline  General: Very fatigued. No ED  Urinary: On flomax, post-void residual 0 cc. Has frequency, nocturia 2-3x/night, no urgency, hematuria, dysuria.   GI: Denies loose stools, rectal pain, or bleeding.   Colonoscopy: Done 10/2015, found polyp.  Repeat in 5 years.    REVIEW OF SYSTEMS:  A comprehensive review of 10 systems was negative except for pertinent positives noted in HPI.    PAST MEDICAL HISTORY/FAMILY HISTORY/SOCIAL HISTORY:  Reviewed in EPIC    ALLERGIES/MEDICATIONS:  Reviewed in EPIC    PHYSICAL EXAM:  Vital Signs for this encounter:   BP 128/68  - Pulse 60  - Temp 36.7 ??C (98.1 ??F) (Temporal)  - Wt 55.9 kg (123 lb 4.8 oz)  - SpO2 97%  - BMI 20.52 kg/m??   Karnofsky/Lansky Performance Status: 90,  Able to carry on normal activity; minor signs or symptoms of disease (ECOG equivalent 0)  General:   No acute distress, alert and oriented X 4   Head: Normocephalic, without obvious abnormality, atraumatic  Eyes: EOMI, no scleral icterus  Lungs: Normal work of breathing  Abdomen: non-distended  Extremities: extremities normal, atraumatic, no cyanosis or edema  Lymph nodes: No palpable supraclavicular or cervical lymphadenopathy  Neurologic: Grossly normal   Rectal: Deferred      RADIOLOGY:  No new imaging to review    Labs:    PSA   Date Value Ref Range Status   03/23/2021 <0.04  0.00 - 4.00 ng/mL Final   12/15/2020 <0.04 0.00 - 4.00 ng/mL Final   09/15/2020 <0.04 0.00 - 4.00 ng/mL Final   05/28/2020 <0.04 0.00 - 4.00 ng/mL Final   02/27/2020 <0.04 0.00 - 4.00 ng/mL Final   08/28/2019 <0.10 0.00 - 4.00 ng/mL Final   01/16/2019 <0.10 0.00 - 4.00 ng/mL Final   05/18/2018 4.59 (H) 0.00 - 4.00 ng/mL Final   10/26/2017 3.88 0.00 - 4.00 ng/mL Final   07/27/2017 4.15 (H) 0.00 - 4.00 ng/mL Final       Rayetta Humphrey, MD  Assistant Professor  [facility] Dept of Radiation Oncology  03/23/2021

## 2021-04-10 NOTE — Unmapped (Signed)
[facility] Clinical Assessment & Refill Coordination Note    Harold Richards, : 07-Jan-1956  Phone: [phone] (home)     All above HIPAA information was verified with patient.     Was a Nurse, learning disability used for this call? No    Specialty Medication(s):   Infectious Disease: Xifaxan     Current Outpatient Medications   Medication Sig Dispense Refill   ??? albuterol HFA 90 mcg/actuation inhaler Inhale 2 puffs every four (4) hours as needed for wheezing. 8 g 3   ??? aspirin (ECOTRIN) 81 MG tablet Take 81 mg by mouth daily.     ??? atorvastatin (LIPITOR) 20 MG tablet Take 1 tablet (20 mg total) by mouth nightly. 90 tablet 3   ??? cholecalciferol, vitamin D3-25 mcg, 1,000 unit,, 25 mcg (1,000 unit) capsule Take 1,000 Units by mouth daily.     ??? donepeziL (ARICEPT) 5 MG tablet Take 1 tablet (5 mg total) by mouth daily. 30 tablet 11   ??? ferrous sulfate 325 mg (65 mg iron) CpER Take 1 tablet by mouth daily.      ??? fluticasone propionate (FLONASE) 50 mcg/actuation nasal spray 1 spray into each nostril nightly. 16 g 0   ??? fluticasone-umeclidin-vilanter (TRELEGY ELLIPTA) 100-62.5-25 mcg inhaler Inhale 1 puff  in the morning. 60 each 3   ??? gabapentin (NEURONTIN) 100 MG capsule Take 1 capsule (100 mg total) by mouth nightly. 90 capsule 3   ??? guaiFENesin (MUCINEX) 600 mg 12 hr tablet Take 600 mg by mouth every morning.     ??? lactulose (CHRONULAC) 10 gram/15 mL solution Take 15 mL (10 g total) by mouth Two (2) times a day. 900 mL 6   ??? loratadine (CLARITIN) 10 mg tablet Take 10 mg by mouth daily.     ??? mirtazapine (REMERON) 15 MG tablet Take 1 tablet (15 mg total) by mouth nightly. 90 tablet 3   ??? multivitamin (TAB-A-VITE/THERAGRAN) per tablet Take 1 tablet by mouth daily.     ??? oxybutynin (DITROPAN XL) 5 MG 24 hr tablet Take 1 tablet (5 mg total) by mouth daily. 30 tablet 11   ??? rifAXIMin (XIFAXAN) 550 mg Tab Take 1 tablet (550 mg total) by mouth Two (2) times a day. 60 tablet 6   ??? SUBOXONE 8-2 mg sublingual film Place 1 Film under the tongue Two (2) times a day.   0   ??? tamsulosin (FLOMAX) 0.4 mg capsule Take 1 capsule (0.4 mg total) by mouth two (2) times a day. 180 capsule 1     No current facility-administered medications for this visit.        Changes to medications: Harold Richards reports no changes at this time.    Allergies   Allergen Reactions   ??? Bicalutamide Rash   ??? Penicillins Rash   ??? Rocephin [Ceftriaxone] Rash     Patient received in Emergency Room and reported rash to stomach and back several hours later.        Changes to allergies: No    SPECIALTY MEDICATION ADHERENCE     Xifaxan 550 mg: approximately 12 days of medicine on hand     Medication Adherence    Patient reported X missed doses in the last month: 0  Specialty Medication: Xifaxan 550mg   Patient is on additional specialty medications: No  Any gaps in refill history greater than 2 weeks in the last 3 months: no  Demonstrates understanding of importance of adherence: yes  Informant: patient  Provider-estimated  medication adherence level: good  Patient is at risk for Non-Adherence: No          Specialty medication(s) dose(s) confirmed: Regimen is correct and unchanged.     Are there any concerns with adherence? No    Adherence counseling provided? Not needed    CLINICAL MANAGEMENT AND INTERVENTION      Clinical Benefit Assessment:    Do you feel the medicine is effective or helping your condition? Yes    Clinical Benefit counseling provided? Not needed    Adverse Effects Assessment:    Are you experiencing any side effects? No    Are you experiencing difficulty administering your medicine? No    Quality of Life Assessment:    How many days over the past month did your hepatic encephalopathy  keep you from your normal activities? For example, brushing your teeth or getting up in the morning. 0    Have you discussed this with your provider? Not needed    Acute Infection Status:    Acute infections noted within Epic:  No active infections  Patient reported infection: None    Therapy Appropriateness:    Is therapy appropriate? Yes, therapy is appropriate and should be continued    DISEASE/MEDICATION-SPECIFIC INFORMATION      N/A    PATIENT SPECIFIC NEEDS     - Does the patient have any physical, cognitive, or cultural barriers? No    - Is the patient high risk? No    - Does the patient require a Care Management Plan? No     - Does the patient require physician intervention or other additional services (i.e. nutrition, smoking cessation, social work)? No      SHIPPING     Specialty Medication(s) to be Shipped:   Infectious Disease: Xifaxan    Other medication(s) to be shipped: No additional medications requested for fill at this time     Changes to insurance: No    Delivery Scheduled: Yes, Expected medication delivery date: 04/15/21.     Medication will be delivered via Next Day Courier to the confirmed prescription address in [facility].    The patient will receive a drug information handout for each medication shipped and additional FDA Medication Guides as required.  Verified that patient has previously received a Conservation officer, historic buildings and a Surveyor, mining.    The patient or caregiver noted above participated in the development of this care plan and knows that they can request review of or adjustments to the care plan at any time.      All of the patient's questions and concerns have been addressed.    Roderic Palau   [facility] Specialty Pharmacist

## 2021-04-14 ENCOUNTER — Ambulatory Visit
Admit: 2021-04-14 | Discharge: 2021-04-15 | Payer: PRIVATE HEALTH INSURANCE | Attending: Internal Medicine | Primary: Internal Medicine

## 2021-04-14 DIAGNOSIS — H9190 Unspecified hearing loss, unspecified ear: Principal | ICD-10-CM

## 2021-04-14 MED ORDER — DONEPEZIL 5 MG TABLET
ORAL_TABLET | Freq: Every day | ORAL | 3 refills | 45 days | Status: CP
Start: 2021-04-14 — End: 2022-04-14

## 2021-04-14 NOTE — Unmapped (Signed)
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 At our initial visit, he scored a 20 out of 30 on the [facility], and he was only able to name 32 out of 60 on the [employer]. His history was notable for poor sleep. His mirtazapine was increased to 15 mg, gabapentin 100 mg was started for RLS, and he was started on donepezil 5 mg. He was also counseled on sleep hygiene and encouraged to use his CPAP. 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 There was quite widespread impairment but with significant variability, so there was some concern for a possible encephalopathy.     Today, he reports that overall, his word finding difficulty and short-term memory loss are stable, although his sleep has much improved. His wife has not noticed any improvement on the donepezil or with the improved sleep. He has not noticed any side effects from the donepezil.     Based on the neuropsychological testing and other findings thus far, the picture remains a little unclear still. As mentioned by Dr. Tiburcio Pea, this would be an atypical Alzheimer's presentation. Though his testing shows quite severe impairments, he otherwise continues to function well, and his symptoms have been stable over the past year. It is possible that his cognitive impairment is secondary to his cirrhosis with history of hepatitis C (now s/p treatment with SVT but was chronic), although he has no signs of hepatic encephalopathy. He is also on suboxone chronically, so this may also slow his cognition and processing speed. Despite this being very unusual for an Alzheimer's presentation, will try increasing the donepezil to 10 mg daily since he has tolerated well. Also reiterated use of CPAP and referred for a hearing test.     Diagnosis or Provisional Diagnosis  ??  Date 12/09/20   Primary diagnosis  ?? mild cognitive impairment   Less common diagnoses abc less common: none   Contributing Factors  ?? sleep disorder, medication affect   Behavioral syndrome? ??  none    [facility]   Discussed registry? yes, interested   Consented for registry?  yes     Plan  Diagnostic testing   - serum laboratories - has regular follow up monitoring of cirrhosis, recent cognitive tests all normal   - neuropsychology - completed, see below. Would benefit from repeated testing yearly   - Imaging - completed  Work on sleep  -  much improved with the increase in mirtazapine and improvement of RLS with gabapentin  - Encouraged use of CPAP  Work on mood  - No issues  Work on Pharmacy  - On suboxone for chronic pain and OUD  Work on Vascular risk factors  - Blood pressure always at goal  Memory focussed medication  -  INCREASE donepezil to 10 mg  Hearing/Vision  - Audiology referral     Reviewed the differential diagnosis, plan of care in detail, as well as other elements as detailed above.  A written summary was provided to the patient, including a current medication and allergy list, problem list and plan of care. All the patient's questions and concerns were addressed to their stated satisfaction .      Return Visit Discussed: No follow-ups on file.    HEALTH EDUCATION/HEALTH LITERACY: PATIENT EDUCATION was provided. Reviewed the differential diagnosis, plan of care in detail, as well as other elements as detailed above.      HPI and Data Summary:     History:  He is here by himself today. [name] is at another appointment.     He felt hopeful that things are a little better, but his wife told him she thinks things are not better. Not worse either. No improvement with the donepezil but has also not noticed any side effects.     Spending his days messing around the house, spending time with his daughter and grandchildren.   Driving still, has not gotten lost again or gotten in an accident. Somebody is always with him unless he drives to the store, which is a block away.     He does think he is sleeping a lot better since last time. He has been taking the increased dose of the mirtazapine and the gabapentin 100 mg.      Cognitive History/ Data Review    ??  ??  Symptom Onset :  ?? 2021   First Symptom:  ?? Short term memory loss   Highest Education:  ?? 10th grade   Occupation  ?? 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 No   Alcohol Use  ?? none   Anosognosia  No   ?? ??       Neuroimaging  MRI brain 11/03/20:  Mild small vessel changes  Possible mild atrophy in parietal temporal area    Neuropsychological Evaluation  Summary of Findings:  [name] presentation is quite unusual. Validity indicators were mixed but somewhat borderline, though this would be realistic for someone with genuine, very severe impairment. The rest of his performance is consistent with severe impairment, but he is reportedly still functioning adequately in independent living skills, which seems surprising given his scores. On testing, his performance was moderately to severely impaired overall, but with a very high level of variability. He scored intact on tests of orientation, spatial perception and confrontation naming (which was also surprising since he was severely impaired on that with Dr. Knox Saliva). He was more consistently impaired on tests of memory (with an especially unusual performance on a list-learning task, with a negative learning curve), executive functions, attention, and processing speed.   ??  At this point, [name] performance suggests likely genuine cognitive impairment, with a pattern suggestive of quite widespread impairment. It is possible that extremely poor sleep along with pain and various medical issues are together causing an encephalopathy and result in the high level of variability we see here. It does not fit typical Alzheimer's presentation, but he could theoretically have a very atypical version of Alzheimer's (with young age of onset, more frontal presentation, and more rapid progression than usual). Other causes of severe, relatively rapid (going from unimpaired to severely impaired over the  course of a year) impairment need to be tested for, so I strongly recommend any additional workup to characterize this as the top priority. First, although his MRI reading only mentioned a lack of atrophy or white matter disease, it looked unusual to me, with various small dark spots not typically seen on brain scans, that were not mentioned in the reading. This is outside my expertise, so I recommend Dr. Knox Saliva take another look at his MRI or possibly consult another neurologist to look into it as well. They should also consider whether an LP, different type of imaging (e.g. PET, MRI with another type of contrast) or other tests (rule out paraneoplastic syndrome, infection, etc) would be helpful.    TEST SCORES: ?? ?? ??   Note: This summary of test scores accompanies the interpretive report and should not be considered in isolation without reference to the appropriate sections in the text. Descriptors are based on appropriate normative data and may be adjusted based on clinical judgment. The terms ???impaired??? and ???within normal limits (WNL)??? are used when a more specific level of functioning cannot be determined. Validity Testing: ?? ?? DESCRIPTOR   ?? ?? ?? ??   Medical Symptom Validity Test (MSVT) --- --- Genuine Severe Memory Impairment Profile   WAIS-IV Reliable Digit Span: --- --- Below Expectation   HVLT-R Recognition Discrimination Index: --- --- Below Expectation   ?? ?? ?? ??   Overall Cognitive Status: ?? ?? ??   ?? ?? ?? ??   ?? Raw Score ?? ??   MoCA Orientation 5 /6 Intact   Current and Past Two [place] Presidents 3 /3 Intact   ?? ?? ?? ??   Memory: ?? ?? ??   ?? ?? ?? ??   Hopkins Verbal Learning Test (HVLT-R), Form 4: Raw Score (T Score) Percentile ??        Trial 1 5 ?? ??        Trial 2 3 ?? ??        Trial 3 3 ?? ??   Total Trials 1-3 11/36 (11) <1 Exceptionally Low   Delayed Recall 2/12 (9) <1 Exceptionally Low   Recognition Discrimination Index 1 (-28) <1 Exceptionally Low   True Positives 6 --- ---   False Positives 5 --- ---   ?? ?? ?? ??   [place] 21 Raw Score Percentile ??   Immediate (verbatim) 9 4 Well Below Average   Immediate (paraphrase) 6 1 Exceptionally Low   Delayed (verbatim) 6 4 Well Below Average   Delayed (paraphrase) 5 2 Exceptionally Low   ?? ?? ?? ??   Benson Figure Raw Score Percentile ??   Delayed Reproduction 7 7 Well Below Average   Delayed Recognition 0 --- Impaired   ?? ?? ?? ??   Attention/Executive Function: ?? ?? ??   ?? ?? ?? ??   Trail Making Test Raw Score (T Score) Percentile ??   Part A 132 secs., 0 errors (18) <1 Exceptionally Low   Part B discontinued - unable to complete <1 Exceptionally Low   ?? ?? ?? ??   WAIS-IV Digit Span: Scaled Score Percentile ??   Forward 3,  longest span (4) 1 Exceptionally Low   Backward 6,  longest span (3) 9 Below Average   ?? ?? ?? ??   Language: ?? ?? ??   ?? ?? ?? ??   Verbal Fluency Tasks Raw Score Percentile ??   COWA (FAS Letters) 15 3 Well Below Average  F 4 ?? ??        A 5 ?? ??        S 6 ?? ??   Animal Naming 11 5 Well Below Average   ?? ?? ?? ??   ?? Raw Score ?? ??   CERAD Naming 13 /15 Average   ?? ?? ?? ??   Visuospatial/Visuoconstruction: ?? ?? ??   ?? ?? ?? ??   ?? Raw Score Percentile ??   MMSE Pentagons Copy 1 /1 Intact   Benson Figure Copy 12/17 --- Exceptionally Low   RBANS Line Orienation 16 26-50 Average   Visual Agnosia Items 7 /7 Intact     Other Neuro Studies  Pending    Cognitive Lab Tests:  Lab Results   Component Value Date    TSH 0.953 10/13/2020      Lab Results   Component Value Date    VITAMINB12 1,281 (H) 10/13/2020        Chemistry        Component Value Date/Time    NA 143 12/23/2020 0843    NA 141 09/25/2014 0955    K 3.8 12/23/2020 0843    K 4.0 09/25/2014 0955    CL 114 (H) 12/23/2020 0843    CL 104 09/25/2014 0955    CO2 22.9 12/23/2020 0843    CO2 28 09/25/2014 0955    BUN 19 12/23/2020 0843    BUN 13 09/25/2014 0955    CREATININE 0.95 12/23/2020 0843    CREATININE 0.6 (L) 09/11/2020 1441    GLU 109 12/23/2020 0843        Component Value Date/Time    CALCIUM 9.2 12/23/2020 0843    CALCIUM 9.1 09/25/2014 0955    ALKPHOS 45 (L) 12/23/2020 0843    ALKPHOS 55 09/25/2014 0955    AST 29 12/23/2020 0843    AST 25 09/25/2014 0955    ALT 17 12/23/2020 0843    ALT 22 09/25/2014 0955    BILITOT <0.2 (L) 12/23/2020 0843    BILITOT 0.6 09/25/2014 0955       No results found for: LABRPR   No results found for: HIV12AB      Cognitive Testing:  See above for neuropsych testing                 Past Medical History:   Diagnosis Date   ??? Actinic keratosis    ??? Asthma    ??? Basal cell carcinoma    ??? Chronic headaches    ??? Colon polyp    ??? Enlarged prostate    ??? GERD (gastroesophageal reflux disease) 02/08/2013   ??? Hepatitis C    ??? Hx of substance abuse (CMS-HCC)     hx of recreational drug use (cocaine IV, intranasal and crack),    ??? Infectious viral hepatitis    ??? Lung nodule seen on imaging study 07/12/2016   ??? Osteoporosis    ??? Other emphysema (CMS-HCC)    ??? Renal calculus    ??? Skin cancer    ??? Tobacco abuse 02/08/2013       Medications  Outpatient Encounter Medications as of 04/14/2021   Medication Sig Dispense Refill   ??? albuterol HFA 90 mcg/actuation inhaler Inhale 2 puffs every four (4) hours as needed for wheezing. 8 g 3 ??? aspirin (ECOTRIN) 81 MG tablet Take 81 mg by mouth daily.     ??? atorvastatin (LIPITOR) 20 MG tablet Take 1 tablet (20 mg total) by mouth  nightly. 90 tablet 3   ??? cholecalciferol, vitamin D3-25 mcg, 1,000 unit,, 25 mcg (1,000 unit) capsule Take 1,000 Units by mouth daily.     ??? donepeziL (ARICEPT) 5 MG tablet Take 1 tablet (5 mg total) by mouth daily. 30 tablet 11   ??? ferrous sulfate 325 mg (65 mg iron) CpER Take 1 tablet by mouth daily.      ??? fluticasone propionate (FLONASE) 50 mcg/actuation nasal spray 1 spray into each nostril nightly. 16 g 0   ??? fluticasone-umeclidin-vilanter (TRELEGY ELLIPTA) 100-62.5-25 mcg inhaler Inhale 1 puff  in the morning. 60 each 3   ??? gabapentin (NEURONTIN) 100 MG capsule Take 1 capsule (100 mg total) by mouth nightly. 90 capsule 3   ??? guaiFENesin (MUCINEX) 600 mg 12 hr tablet Take 600 mg by mouth every morning.     ??? lactulose (CHRONULAC) 10 gram/15 mL solution Take 15 mL (10 g total) by mouth Two (2) times a day. 900 mL 6   ??? loratadine (CLARITIN) 10 mg tablet Take 10 mg by mouth daily.     ??? mirtazapine (REMERON) 7.5 MG tablet      ??? multivitamin (MULTIPLE VITAMIN ORAL) Take 1 tablet by mouth daily.     ??? oxybutynin (DITROPAN XL) 5 MG 24 hr tablet Take 1 tablet (5 mg total) by mouth daily. 30 tablet 11   ??? rifAXIMin (XIFAXAN) 550 mg Tab Take 1 tablet (550 mg total) by mouth Two (2) times a day. 60 tablet 6   ??? SUBOXONE 8-2 mg sublingual film Place 1 Film under the tongue Two (2) times a day.   0   ??? tamsulosin (FLOMAX) 0.4 mg capsule Take 1 capsule (0.4 mg total) by mouth two (2) times a day. 180 capsule 1   ??? multivitamin (TAB-A-VITE/THERAGRAN) per tablet Take 1 tablet by mouth daily.     ??? [DISCONTINUED] mirtazapine (REMERON) 15 MG tablet Take 1 tablet (15 mg total) by mouth nightly. 90 tablet 3     No facility-administered encounter medications on file as of 04/14/2021.       Allergies  Allergies   Allergen Reactions   ??? Bicalutamide Rash   ??? Penicillins Rash   ??? Rocephin [Ceftriaxone] Rash     Patient received in Emergency Room and reported rash to stomach and back several hours later.        Family History   Family History   Problem Relation Age of Onset   ??? Heart attack Father    ??? Hypertension Father    ??? Lung cancer Mother    ??? Bone cancer Mother    ??? Hypertension Mother    ??? Hepatitis Sister    ??? Melanoma Neg Hx        Social History   Social History     Social History Narrative   ??? Not on file       Exam  General Appearance: attentive, hard of hearing  Vital Signs: BP 115/59 (BP Site: R Arm, BP Position: Sitting, BP Cuff Size: Large)  - Pulse 68  - Resp 17  - Ht 165.1 cm (5' 5)  - Wt 56.3 kg (124 lb 3 oz)  - BMI 20.67 kg/m??   HEENT: Sclera were anicteric and non-injected.   Chest: Clear to auscultation without wheezes, crackles, or rales.  Cor: Regular rate and rhythm without murmurs, gallops, or rubs.  Ext: No cyanosis, clubbing or edema.  Neurological Exam:  Cranial Nerves: Pupils equal round and reactive to light,  extraocular movments full without saccadic intrusions.    Gait: normal    Initial exam May 2022:  HEENT: Sclera were anicteric and non-injected. Oropharynx examination revealed a Mallampati Grade II (hard and soft palate, upper portion of tonsils anduvula visible) airway.  Oropharynx findings included:some missing teeth.  Neck was supple.   Lymph: No cervical or supraclavicular lymphadenopathy.   Chest: Clear to auscultation without wheezes, crackles, or rales.  Cor: Regular rate and rhythm without murmurs, gallops, or rubs.  Ext: No cyanosis, clubbing or edema.  Neurological Exam:  Cranial Nerves: Pupils equal round and reactive to light, extraocular movments full without saccadic intrusions.  Visual fields full to confrontation.  Facial movement in the upper and lower face symmetric.  Facial sensation symmetric to light touch and temp.  Hearing intact to finger rub. Tongue and palate midline.  Sternocleidomastoid and Trapeizius strength full.  Motor: No pronator drift.  Fine finger movements rapid bilaterally.  Power exam 5/5 strength in upper and lower extremities.   No tremor, no asterixis   Sensory: Intact to light touch and vibratory stimulation in the distal extremities.  Coordination: No dysmetria on finger to nose testing though hard for him to follow directions.  No apraxia  Gait: normal    Labs  Basic Metabolic Profile   Lab Results   Component Value Date    BUN 19 12/23/2020    BUN 13 09/25/2014    CO2 22.9 12/23/2020    CO2 28 09/25/2014           CBC   Lab Results   Component Value Date    WBC 4.6 12/23/2020    WBC 6.6 09/25/2014    RBC 3.80 (L) 12/23/2020    RBC 4.25 (L) 09/25/2014    MCV 101.0 (H) 12/23/2020    MCV 100 09/25/2014    MCH 35.1 (H) 12/23/2020    MCH 34 09/25/2014    MCHC 34.8 12/23/2020    MCHC 34 09/25/2014    RDW 12.6 12/23/2020    RDW 12.8 09/25/2014           Hepatic Function   Lab Results   Component Value Date    AST 29 12/23/2020    AST 25 09/25/2014    ALT 17 12/23/2020    ALT 22 09/25/2014           Comprehensive Metabolic Profile   Lab Results   Component Value Date    BUN 19 12/23/2020    BUN 13 09/25/2014    CO2 22.9 12/23/2020    CO2 28 09/25/2014    Anion Gap 6 12/23/2020    Anion Gap 9 09/25/2014    AST 29 12/23/2020    AST 25 09/25/2014    ALT 17 12/23/2020    ALT 22 09/25/2014        Vitamin B12   Lab Results   Component Value Date    Vitamin B-12 1,281 (H) 10/13/2020    Vitamin B-12 >1,000 (H) 10/10/2019    Vitamin B-12 >1,000 (H) 02/25/2019    Vitamin B-12 >1,000 (H) 08/07/2018    Vitamin B-12 >1,000 (H) 11/20/2017        TSH   Lab Results   Component Value Date    TSH 0.953 10/13/2020    TSH 0.83 02/08/2013        RPR   Lab Results   Component Value Date    RPR Nonreactive 10/13/2020        Folate Folate RBC  Lab Results   Component Value Date    Folate >24.0 10/13/2020     Lab Results   Component Value Date    Folate >24.0 10/13/2020        I personally spent 44 minutes face-to-face and non-face-to-face in the care of this patient, which includes all pre, intra, and post visit time on the date of service.     Author:  Osborn Coho 04/14/2021 9:16 AM    *Any errors in syntax or even information may not have been identified and edited on initial review prior to signing this note.

## 2021-04-14 NOTE — Unmapped (Signed)
[name] 's [name] shipment will be delayed as a result of the medication is too soon to refill until 9/13.     I have reached out to the patient  at [phone] and communicated the delivery change. We will reschedule the medication for the delivery date that the patient agreed upon.  We have confirmed the delivery date as 9/13, via same day courier.

## 2021-04-14 NOTE — Unmapped (Addendum)
-   Increase the donepezil to 10 mg daily.   - I have placed a referral for you to get a hearing test. They will call you to schedule.  - I am glad your sleep is better. Let me know if you start to have leg pain again.   - I will be in touch about what additional imaging or testing we recommend.     You can always reach out to me through MyChart.

## 2021-04-20 MED FILL — XIFAXAN 550 MG TABLET: ORAL | 30 days supply | Qty: 60 | Fill #6

## 2021-05-07 DIAGNOSIS — G9340 Encephalopathy, unspecified: Principal | ICD-10-CM

## 2021-05-07 MED ORDER — XIFAXAN 550 MG TABLET
ORAL_TABLET | Freq: Two times a day (BID) | ORAL | 6 refills | 30 days
Start: 2021-05-07 — End: ?

## 2021-05-07 NOTE — Unmapped (Signed)
[facility] Refill Coordination Note    Specialty Medication(s) to be Shipped:   Infectious Disease: Xifaxan    Other medication(s) to be shipped: No additional medications requested for fill at this time     Harold Richards, DOB: Apr 27, 1956  Phone: [phone] (home)       All above HIPAA information was verified with patient.     Was a Nurse, learning disability used for this call? No    Completed refill call assessment today to schedule patient's medication shipment from the [facility] [phone]).  All relevant notes have been reviewed.     Specialty medication(s) and dose(s) confirmed: Regimen is correct and unchanged.   Changes to medications: Harold Richards reports no changes at this time.  Changes to insurance: No  New side effects reported not previously addressed with a pharmacist or physician: None reported  Questions for the pharmacist: No    Confirmed patient received a Conservation officer, historic buildings and a Surveyor, mining with first shipment. The patient will receive a drug information handout for each medication shipped and additional FDA Medication Guides as required.       DISEASE/MEDICATION-SPECIFIC INFORMATION        N/A    SPECIALTY MEDICATION ADHERENCE     Medication Adherence    Patient reported X missed doses in the last month: 0  Specialty Medication: xifaxan 550mg               Were doses missed due to medication being on hold? No    Unable to confirm quantity on hand    REFERRAL TO PHARMACIST     Referral to the pharmacist: Not needed      [facility]     Shipping address confirmed in Epic.     Delivery Scheduled: Yes, Expected medication delivery date: 10/6.  However, Rx request for refills was sent to the provider as there are none remaining.     Medication will be delivered via Next Day Courier to the prescription address in Epic WAM.    Harold Richards   [facility] Specialty Technician

## 2021-05-09 MED ORDER — RIFAXIMIN 550 MG TABLET
ORAL_TABLET | Freq: Two times a day (BID) | ORAL | 5 refills | 30.00000 days | Status: CP
Start: 2021-05-09 — End: ?
  Filled 2021-05-18: qty 60, 30d supply, fill #0

## 2021-05-09 NOTE — Unmapped (Signed)
Refill request for Xifaxan 550 mg tabs    LCV with Dianne Dun, NP on 12/23/20 with plan to continue Xifaxan at current dose    Recall placed for 6 month follow-up appointment with Dr. Piedad Climes per Dawn's redistribution list (due mid-November)    Lanora Manis, RN

## 2021-05-10 DIAGNOSIS — E785 Hyperlipidemia, unspecified: Principal | ICD-10-CM

## 2021-05-10 MED ORDER — ATORVASTATIN 20 MG TABLET
ORAL_TABLET | Freq: Every evening | ORAL | 3 refills | 90 days
Start: 2021-05-10 — End: ?

## 2021-05-12 NOTE — Unmapped (Signed)
Confirmed new delivery date as 10/12 via [place] Courier with patient due to RFTS until 10/11.

## 2021-05-13 MED ORDER — ATORVASTATIN 20 MG TABLET
ORAL_TABLET | Freq: Every evening | ORAL | 3 refills | 90 days | Status: CP
Start: 2021-05-13 — End: ?

## 2021-05-13 NOTE — Unmapped (Signed)
Complex Case Management  SUMMARY NOTE    High Risk Care Coordinator  spoke with patient and verified correct patient using two identifiers today to introduce the Complex Case Management program.     Discussed the following:  Program Services    Program status: Interested    Discuss at next visit: Introduction to Complex Case Management

## 2021-05-19 DIAGNOSIS — K769 Liver disease, unspecified: Principal | ICD-10-CM

## 2021-05-19 DIAGNOSIS — K7460 Unspecified cirrhosis of liver: Principal | ICD-10-CM

## 2021-05-19 NOTE — Unmapped (Signed)
Complex Case Management  SUMMARY NOTE    Attempted to contact pt today at Cell number to introduce Complex Case Management services. Left message to return call.; 2nd attempt, letter sent.    Discuss at next visit: No answer, letter sent

## 2021-05-19 NOTE — Unmapped (Signed)
VM from patient stating that he has an appointment with Dr. Piedad Climes in December and wants to know if he needs another MRI prior to that appointment    Chart reviewed--> last MRI was done in August 2022 with plan to repeat in 6 months--> will be due February 2023    Will enter order and notify patient     -Lanora Manis, RN

## 2021-05-31 MED ORDER — TAMSULOSIN 0.4 MG CAPSULE
ORAL_CAPSULE | Freq: Two times a day (BID) | ORAL | 1 refills | 90 days | Status: CP
Start: 2021-05-31 — End: ?

## 2021-05-31 NOTE — Unmapped (Signed)
Pt wife called to reschedule appt w/ Dr Carles Collet from 11/10 to 11/16 at 1pm and verbally confirmed the appt

## 2021-06-11 NOTE — Unmapped (Signed)
[facility] Refill Coordination Note    Specialty Medication(s) to be Shipped:   Infectious Disease: Xifaxan    Other medication(s) to be shipped: No additional medications requested for fill at this time     Harold Richards, DOB: 1955-11-16  Phone: [phone] (home)       All above HIPAA information was verified with patient's family member, wife.     Was a Nurse, learning disability used for this call? No    Completed refill call assessment today to schedule patient's medication shipment from the [facility] [phone]).  All relevant notes have been reviewed.     Specialty medication(s) and dose(s) confirmed: Regimen is correct and unchanged.   Changes to medications: Harold Richards reports no changes at this time.  Changes to insurance: No  New side effects reported not previously addressed with a pharmacist or physician: None reported  Questions for the pharmacist: No    Confirmed patient received a Conservation officer, historic buildings and a Surveyor, mining with first shipment. The patient will receive a drug information handout for each medication shipped and additional FDA Medication Guides as required.       DISEASE/MEDICATION-SPECIFIC INFORMATION        N/A    SPECIALTY MEDICATION ADHERENCE     Medication Adherence    Patient reported X missed doses in the last month: 0  Specialty Medication: xifaxan 550mg               Were doses missed due to medication being on hold? No    Unable to confirm quantity on hand     REFERRAL TO PHARMACIST     Referral to the pharmacist: Not needed      [facility]     Shipping address confirmed in Epic.     Delivery Scheduled: Yes, Expected medication delivery date: 11/14.     Medication will be delivered via Same Day Courier to the prescription address in Epic WAM.    [name]   [facility] Specialty Technician

## 2021-06-21 MED FILL — XIFAXAN 550 MG TABLET: ORAL | 30 days supply | Qty: 60 | Fill #1

## 2021-06-23 ENCOUNTER — Ambulatory Visit: Admit: 2021-06-23 | Payer: PRIVATE HEALTH INSURANCE | Attending: Radiation Oncology | Primary: Radiation Oncology

## 2021-06-23 DIAGNOSIS — C61 Malignant neoplasm of prostate: Principal | ICD-10-CM

## 2021-06-23 DIAGNOSIS — J438 Other emphysema: Principal | ICD-10-CM

## 2021-06-23 LAB — PSA: PROSTATE SPECIFIC ANTIGEN: <0.04 ng/mL (ref 0.00–4.00)

## 2021-06-23 MED ORDER — SILDENAFIL 50 MG TABLET
ORAL_TABLET | Freq: Every day | ORAL | 1 refills | 30.00000 days | Status: CP | PRN
Start: 2021-06-23 — End: 2021-06-23

## 2021-06-23 MED ORDER — TRELEGY ELLIPTA 100 MCG-62.5 MCG-25 MCG POWDER FOR INHALATION
3 refills | 0.00000 days
Start: 2021-06-23 — End: ?

## 2021-06-23 NOTE — Unmapped (Signed)
RADIATION ONCOLOGY FOLLOW-UP VISIT NOTE     Encounter Date: 06/23/2021  Patient Name: Harold Richards  Medical Record Number: [id number]    DIAGNOSIS:  65 y.o. male with high risk prostate cancer, cT1c, Gleason 4+4=8 in 2/12 cores, PSA 4.59 s/p external beam RT (45Gy) with brachytherapy boost (brachy done 11/05/2018)    DURATION SINCE COMPLETION OF RADIOTHERAPY:  2 years, 8 months (brachy on 11/05/2018)    Lupron #1 (3 month injection) 07/17/2018  Lupron #2 (3 month injection) 10/16/2018  Lupron #3 (3 month injection) 01/16/2019  Lupron #4 (1 month injection) 04/18/2019  Eligard #5 (3 month injection) 05/29/2019  Eligard #6 (3 month injection) 08/28/2019  Eligard #7 (3 month injection) 11/27/2019    ASSESSMENT:  Disease Status: No Evidence of Disease (NED).  PSA today  <0.04    RECOMMENDATIONS:  1. PSA today is <0.04  2. ADT:  Completed- last injection 11/27/2019  3. Hot flashes:  Improving, but suspect testosterone still low- discussed that last lupron would have worn off ~02/2020 and it would not be unusual to take 1-1.5 years to recover.   4. GU: Still increased frequency, urgency from baseline.  Continue flomax BID (had been in ditropan before, he's unclear if still taking).  Had some issues with hematuria in in early 2022 but cystoscopy was negative for bladder changes/masses- may be related to kidney stones  5. GI:  No issues- colonoscopy in 2017 with recommendation for next scope in 2022- ordered today and he will call to schedule  6. ED:  Will prescribe viagra 50mg - he will get goodrx coupon  7. FOLLOW-UP:  He will return in 6 months for routine follow-up and PSA check    INTERVAL HISTORY:    Doing well today in clinic without any major changes from prior.  Intermittently slow stream, nocturia 2-3x, + urgency (rare urge incontinence).  No further episodes of hematuria.  No diarrhea, pain with BM, bleeding.  Rare mucous.  Hot flashes have decreased to 1-2/day (down from 3-4 at our last visit).  Sex drive is decreased still.    Just got back from a trip to [place] world with his family- very tiring but grandkids had a great time.  He has moved into his new home (downsized) but is still moving boxes from his old house.    Baseline  General: Very fatigued. No ED  Urinary: On flomax, post-void residual 0 cc. Has frequency, nocturia 2-3x/night, no urgency, hematuria, dysuria.   GI: Denies loose stools, rectal pain, or bleeding.   Colonoscopy: Done 10/2015, found polyp.  Repeat in 5 years.    REVIEW OF SYSTEMS:  A comprehensive review of 10 systems was negative except for pertinent positives noted in HPI.    PAST MEDICAL HISTORY/FAMILY HISTORY/SOCIAL HISTORY:  Reviewed in EPIC    ALLERGIES/MEDICATIONS:  Reviewed in EPIC    PHYSICAL EXAM:  Vital Signs for this encounter:   BP 109/60  - Pulse 63  - Temp 37.1 ??C (98.8 ??F) (Temporal)  - Wt 54.3 kg (119 lb 12.8 oz)  - SpO2 98%  - BMI 19.94 kg/m??   Karnofsky/Lansky Performance Status: 90,  Able to carry on normal activity; minor signs or symptoms of disease (ECOG equivalent 0)  General:   No acute distress, alert and oriented X 4   Head: Normocephalic, without obvious abnormality, atraumatic  Eyes: EOMI, no scleral icterus  Lungs: Normal work of breathing  Abdomen: non-distended  Extremities: extremities normal, atraumatic, no cyanosis or edema  Lymph nodes:  No palpable supraclavicular or cervical lymphadenopathy  Neurologic: Grossly normal   Rectal: Deferred      RADIOLOGY:  No new imaging to review    Labs:    PSA   Date Value Ref Range Status   06/23/2021 <0.04 0.00 - 4.00 ng/mL Final   03/23/2021 <0.04 0.00 - 4.00 ng/mL Final   12/15/2020 <0.04 0.00 - 4.00 ng/mL Final   09/15/2020 <0.04 0.00 - 4.00 ng/mL Final   05/28/2020 <0.04 0.00 - 4.00 ng/mL Final   02/27/2020 <0.04 0.00 - 4.00 ng/mL Final   08/28/2019 <0.10 0.00 - 4.00 ng/mL Final   01/16/2019 <0.10 0.00 - 4.00 ng/mL Final   05/18/2018 4.59 (H) 0.00 - 4.00 ng/mL Final   10/26/2017 3.88 0.00 - 4.00 ng/mL Final   07/27/2017 4.15 (H) 0.00 - 4.00 ng/mL Final       Rayetta Humphrey, MD  Assistant Professor  [facility] Dept of Radiation Oncology  06/23/2021

## 2021-06-28 MED ORDER — TRELEGY ELLIPTA 100 MCG-62.5 MCG-25 MCG POWDER FOR INHALATION
Freq: Every day | RESPIRATORY_TRACT | 3 refills | 60.00000 days | Status: CP
Start: 2021-06-28 — End: ?

## 2021-07-15 NOTE — Unmapped (Signed)
[facility] Refill Coordination Note    Specialty Medication(s) to be Shipped:   Infectious Disease: Xifaxan    Other medication(s) to be shipped: No additional medications requested for fill at this time     Harold Richards, DOB: 1955/09/19  Phone: [phone] (home)       All above HIPAA information was verified with patient's family member, wife.     Was a Nurse, learning disability used for this call? No    Completed refill call assessment today to schedule patient's medication shipment from the [facility] [phone]).  All relevant notes have been reviewed.     Specialty medication(s) and dose(s) confirmed: Regimen is correct and unchanged.   Changes to medications: Harold Richards reports no changes at this time.  Changes to insurance: No  New side effects reported not previously addressed with a pharmacist or physician: None reported  Questions for the pharmacist: No    Confirmed patient received a Conservation officer, historic buildings and a Surveyor, mining with first shipment. The patient will receive a drug information handout for each medication shipped and additional FDA Medication Guides as required.       DISEASE/MEDICATION-SPECIFIC INFORMATION        N/A    SPECIALTY MEDICATION ADHERENCE     Medication Adherence    Patient reported X missed doses in the last month: 0  Specialty Medication: xifaxan 550mg               Were doses missed due to medication being on hold? No    Unable to confirm quantity on hand    REFERRAL TO PHARMACIST     Referral to the pharmacist: Not needed      [facility]     Shipping address confirmed in Epic.     Delivery Scheduled: Yes, Expected medication delivery date: 12/13.     Medication will be delivered via Next Day Courier to the prescription address in Epic WAM.    [name]   [facility] Specialty Technician

## 2021-07-19 MED FILL — XIFAXAN 550 MG TABLET: ORAL | 30 days supply | Qty: 60 | Fill #2

## 2021-07-26 ENCOUNTER — Ambulatory Visit
Admit: 2021-07-26 | Discharge: 2021-07-27 | Payer: PRIVATE HEALTH INSURANCE | Attending: Gastroenterology | Primary: Gastroenterology

## 2021-07-26 DIAGNOSIS — K7460 Unspecified cirrhosis of liver: Principal | ICD-10-CM

## 2021-07-26 DIAGNOSIS — E43 Unspecified severe protein-calorie malnutrition: Principal | ICD-10-CM

## 2021-07-26 LAB — CBC W/ AUTO DIFF
BASOPHILS ABSOLUTE COUNT: 0 10*9/L (ref 0.0–0.1)
BASOPHILS RELATIVE PERCENT: 0.9 %
EOSINOPHILS ABSOLUTE COUNT: 0.1 10*9/L (ref 0.0–0.5)
EOSINOPHILS RELATIVE PERCENT: 1.1 %
HEMATOCRIT: 39.4 % (ref 39.0–48.0)
HEMOGLOBIN: 13.4 g/dL (ref 12.9–16.5)
LYMPHOCYTES ABSOLUTE COUNT: 1.1 10*9/L (ref 1.1–3.6)
LYMPHOCYTES RELATIVE PERCENT: 21.9 %
MEAN CORPUSCULAR HEMOGLOBIN CONC: 34 g/dL (ref 32.0–36.0)
MEAN CORPUSCULAR HEMOGLOBIN: 36.2 pg — ABNORMAL HIGH (ref 25.9–32.4)
MEAN CORPUSCULAR VOLUME: 106.4 fL — ABNORMAL HIGH (ref 77.6–95.7)
MEAN PLATELET VOLUME: 7.7 fL (ref 6.8–10.7)
MONOCYTES ABSOLUTE COUNT: 0.3 10*9/L (ref 0.3–0.8)
MONOCYTES RELATIVE PERCENT: 6.8 %
NEUTROPHILS ABSOLUTE COUNT: 3.5 10*9/L (ref 1.8–7.8)
NEUTROPHILS RELATIVE PERCENT: 69.3 %
PLATELET COUNT: 193 10*9/L (ref 150–450)
RED BLOOD CELL COUNT: 3.71 10*12/L — ABNORMAL LOW (ref 4.26–5.60)
RED CELL DISTRIBUTION WIDTH: 12.6 % (ref 12.2–15.2)
WBC ADJUSTED: 5 10*9/L (ref 3.6–11.2)

## 2021-07-26 LAB — COMPREHENSIVE METABOLIC PANEL
ALBUMIN: 4.1 g/dL (ref 3.4–5.0)
ALKALINE PHOSPHATASE: 36 U/L — ABNORMAL LOW (ref 46–116)
ALT (SGPT): 17 U/L (ref 10–49)
ANION GAP: 7 mmol/L (ref 5–14)
AST (SGOT): 22 U/L (ref <=34)
BILIRUBIN TOTAL: 0.2 mg/dL — ABNORMAL LOW (ref 0.3–1.2)
BLOOD UREA NITROGEN: 26 mg/dL — ABNORMAL HIGH (ref 9–23)
BUN / CREAT RATIO: 30
CALCIUM: 9.6 mg/dL (ref 8.7–10.4)
CHLORIDE: 110 mmol/L — ABNORMAL HIGH (ref 98–107)
CO2: 24.7 mmol/L (ref 20.0–31.0)
CREATININE: 0.86 mg/dL
EGFR CKD-EPI (2021) MALE: >90 mL/min/{1.73_m2} (ref >=60)
GLUCOSE RANDOM: 96 mg/dL (ref 70–179)
POTASSIUM: 4.8 mmol/L (ref 3.4–4.8)
PROTEIN TOTAL: 7.1 g/dL (ref 5.7–8.2)
SODIUM: 142 mmol/L (ref 135–145)

## 2021-07-26 LAB — PROTIME-INR
INR: 1.19
PROTIME: 13.6 s — ABNORMAL HIGH (ref 9.8–12.8)

## 2021-07-26 LAB — AFP TUMOR MARKER: AFP-TUMOR MARKER: <2 ng/mL (ref <=8)

## 2021-07-26 NOTE — Unmapped (Addendum)
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 HCV treatment  ~ 2016    Presentation of Current Illness:    Mr. Lento is a 65 y.o. pleasant Caucasian gentleman who presents today for follow up care. 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 Cured of HCV in 2016 s/p Harvoni x 12 weeks. He was treatment naive with genotype 1a. History of recreational drug use (cocaine IV, intranasal and crack), three tattoos with one being homemade, and incarceration. Fibroscan 20 kPa consistent with F4 (cirrhosis). PMH of prostate cancer (high??risk prostate cancer, cT1c, Gleason??4+4=8??in 2/12??cores, PSA 4.59 s/p external beam RT (45Gy) with brachytherapy boost (brachy done 11/05/2018), OSA with overlap COPD, basal cell carcinoma, chronic fungal infection of maxillary sinus, pulmonary nodules, nicotine dependency, chronic headaches, and renal calculus. More recently he has been diagnosed with neurocognitive impairment and has been following with neurology. On remeron and aricept.     LCV 12/23/20. Today presents with his wife. They say that he has been doing well from neurologic standpoint. No worsening of his neuro symptoms and no episodes of encephalopathy. Their greatest concern is that he continues to lose weight. He is currently 114# (BMI 19) and has lost 10-15# pounds in the past year without change in eating habits. Drinking 3 boosts a day and getting ~90g protein daily. He does continue to smoke 1ppd which is reduced for him. He has cut back a lot by putting restrictions on where he can smoke. He does wish to quite completely and is contemplative about this. He says that his mother, aunt, and grandfather all died of lung cancer and that he does not wish his grandchildren to start smoking. Most recent CT scan 03/12/21 with stable nodular opacity in right lung apex with adjacent architectural distortion and linear atelectasis, unchanged since prior study. No new or enlarging nodules. Stable 1.3 cm right hilar lymphadenopathy.    Cirrhosis Care:  -- Varices screen: normal platelets and spleen size with Fibroscan < 25 kPa. EGD 2021 without evidence of varices.  -- HCC screen: MRI of abdomen 03/12/21: Unchanged 1.5 cm segment 4 enhancing lesion which fades to isointensity on delayed imaging. LR-2; Unchanged peripherally enhancing segment 3 lesion. LR-2; Similar appearance of additional arterially enhancing lesions within the right hepatic lobe without washout or delayed capsular enhancement. LR-2.   -- Immunity hepatitis B.   -- Immunity hepatitis A: Series completed.   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  -- OLT status: Not indicated at this time based on low MELD score  -- MELD score: 7, Child-Pugh: 5 , Class: A     MELD-Na score: 7 at 09/22/2020  9:01 AM  MELD score: 7 at 09/22/2020  9:01 AM  Calculated from:  Serum Creatinine: 0.88 mg/dL (Using min of 1 mg/dL) at 7/84/6962  9:52 AM  Serum Sodium: 142 mmol/L (Using max of 137 mmol/L) at 09/22/2020  9:01 AM  Total Bilirubin: 0.3 mg/dL (Using min of 1 mg/dL) at 8/41/3244  0:10 AM  INR(ratio): 1.14 at 09/22/2020  9:01 AM  Age: 41 years   -- Fibroscan 06/16/2014: 20.0 kPa/F4  -- Chronic hepatitis C, genotype 1a, s/p treatment with SVR in 2016  Pre-treatment HCV RNA:  14474 IU/mL  HCV RNA 08/23/2014 - TW #6/12 - non-detectable  HCV RNA 09/24/2014 - EOT- non-detectable  HCV RNA SVR 01/01/2015 - -not detected  HCV RNA 01/26/2017 - not detected    ROS:  All systems reviewed and negative except in HPI.     Allergies:   Allergies   Allergen Reactions   ??? Bicalutamide Rash   ??? Penicillins Rash   ??? Rocephin [Ceftriaxone] Rash     Patient received in Emergency Room and reported rash to stomach and back several hours later.      Medications:  Current Outpatient Medications   Medication Sig Dispense Refill   ??? albuterol HFA 90 mcg/actuation inhaler Inhale 2 puffs every four (4) hours as needed for wheezing. 8 g 3   ??? atorvastatin (LIPITOR) 20 MG tablet Take 1 tablet (20 mg total) by mouth nightly. 90 tablet 3   ??? cholecalciferol, vitamin D3-25 mcg, 1,000 unit,, 25 mcg (1,000 unit) capsule Take 1,000 Units by mouth daily.     ??? donepeziL (ARICEPT) 5 MG tablet Take 2 tablets (10 mg total) by mouth in the morning. 90 tablet 3   ??? ferrous sulfate 325 mg (65 mg iron) CpER Take 1 tablet by mouth daily.      ??? fluticasone propionate (FLONASE) 50 mcg/actuation nasal spray 1 spray into each nostril nightly. 16 g 0   ??? fluticasone-umeclidin-vilanter (TRELEGY ELLIPTA) 100-62.5-25 mcg inhaler Inhale 1 puff daily. 60 each 3   ??? gabapentin (NEURONTIN) 100 MG capsule Take 1 capsule (100 mg total) by mouth nightly. 90 capsule 3   ??? guaiFENesin (MUCINEX) 600 mg 12 hr tablet Take 600 mg by mouth every morning.     ??? lactulose (CHRONULAC) 10 gram/15 mL solution Take 15 mL (10 g total) by mouth Two (2) times a day. 900 mL 6   ??? loratadine (CLARITIN) 10 mg tablet Take 10 mg by mouth daily.     ??? mirtazapine (REMERON) 15 MG tablet Take 1 tablet (15 mg total) by mouth nightly. 90 tablet 3   ??? multivitamin (MULTIPLE VITAMIN ORAL) Take 1 tablet by mouth daily.     ??? oxybutynin (DITROPAN XL) 5 MG 24 hr tablet Take 1 tablet (5 mg total) by mouth daily. 30 tablet 11   ??? rifAXIMin (XIFAXAN) 550 mg Tab Take 1 tablet (550 mg total) by mouth Two (2) times a day. 60 tablet 5   ??? sildenafiL (VIAGRA) 50 MG tablet Take 1 tablet (50 mg total) by mouth daily as needed for erectile dysfunction. 30 tablet 1   ??? SUBOXONE 8-2 mg sublingual film Place 1 Film under the tongue Two (2) times a day.   0   ??? tamsulosin (FLOMAX) 0.4 mg capsule Take 1 capsule (0.4 mg total) by mouth two (2) times a day. 180 capsule 1   ??? aspirin (ECOTRIN) 81 MG tablet Take 81 mg by mouth daily. (Patient not taking: Reported on 07/26/2021)       No current facility-administered medications for this visit.     Active Ambulatory Problems Diagnosis Date Noted   ??? Chronic headaches    ??? Renal calculus    ??? Tobacco use disorder 02/08/2013   ??? Nasal septum perforation 02/12/2013   ??? Insomnia 02/13/2013   ??? Other emphysema (CMS-HCC)    ??? Hepatitis C 03/05/2014   ??? 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 Proteinuria 09/28/2017   ??? Prostate cancer (CMS-HCC) 09/11/2018   ??? Cirrhosis (CMS-HCC) 03/14/2019   ??? Community acquired pneumonia of right upper lobe of lung 04/07/2019   ??? Sinusitis 06/24/2019   ??? 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 Opioid use  disorder, mild, in sustained remission, on maintenance therapy (CMS-HCC) 02/25/2020   ??? History of nonmelanoma skin cancer 10/14/2020     Resolved Ambulatory Problems     Diagnosis Date Noted   ??? Chest pain 02/07/2013   ??? GERD (gastroesophageal reflux disease) 02/08/2013   ??? Skin lesion 02/12/2013   ??? Sialolithiasis of submandibular gland 04/08/2015   ??? Rib pain on left side 06/16/2016   ??? Health care maintenance 06/16/2016   ??? Nonspecific L Lung micronodules, repeat LDCT due 07/12/2017 07/12/2016   ??? Atherosclerosis of coronary artery 08/16/2016   ??? Actinic keratosis 02/01/2017   ??? Elevated PSA 09/28/2017   ??? Benign localized hyperplasia of prostate with urinary obstruction 09/28/2017   ??? Abnormal prostate exam 09/28/2017   ??? Hepatic encephalopathy 03/14/2019     Past Medical History:   Diagnosis Date   ??? Asthma    ??? Basal cell carcinoma    ??? Colon polyp    ??? Enlarged prostate    ??? Hx of substance abuse (CMS-HCC)    ??? Infectious viral hepatitis    ??? Osteoporosis    ??? Skin cancer    ??? Tobacco abuse 02/08/2013     Past Surgical History:   Procedure Laterality Date   ??? CERVICAL FUSION      cage   ??? PR COLSC FLX W/RMVL OF TUMOR POLYP LESION SNARE TQ N/A 10/20/2015    Procedure: COLONOSCOPY FLEX; W/REMOV TUMOR/LES BY SNARE;  Surgeon: Annie Paras, MD;  [facility];  Service: Gastroenterology   ??? PR EXCISION SUBMAXILLARY GLAND Right 03/31/2015    Procedure: EXC SUBMANDIBULAR GLAND;  Surgeon: Lauralee Evener, MD;  Location: ASC [facility];  Service: ENT   ??? PR REMV UPPER JAW-MAXILLECTOMY Left 07/22/2019    Procedure: MAXILLECTOMY; WO ORBITAL EXENTERATION;  Surgeon: Adam Swaziland Kimple, MD;  Location: ASC OR [facility];  Service: ENT   ??? PR STEREOTACTIC COMP ASSIST PROC,CRANIAL,EXTRADURAL Left 07/22/2019    Procedure: STEREOTACTIC COMPUTER-ASSISTED (NAVIGATIONAL) PROCEDURE; CRANIAL, EXTRADURAL;  Surgeon: Adam Swaziland Kimple, MD;  Location: ASC OR [facility];  Service: ENT   ??? PR UPPER GI ENDOSCOPY,DIAGNOSIS N/A 08/18/2017    Procedure: UGI ENDO, INCLUDE ESOPHAGUS, STOMACH, & DUODENUM &/OR JEJUNUM; DX W/WO COLLECTION SPECIMN, BY BRUSH OR WASH;  Surgeon: Rona Ravens, MD;  Location: [facility];  Service: Gastroenterology   ??? PR UPPER GI ENDOSCOPY,DIAGNOSIS N/A 11/08/2019    Procedure: UGI ENDO, INCLUDE ESOPHAGUS, STOMACH, & DUODENUM &/OR JEJUNUM; DX W/WO COLLECTION SPECIMN, BY BRUSH OR WASH;  Surgeon: Janyth Pupa, MD;  Location: [facility];  Service: Gastroenterology   ??? SKIN BIOPSY       Family History   Problem Relation Age of Onset   ??? Heart attack Father    ??? Hypertension Father    ??? Lung cancer Mother    ??? Bone cancer Mother    ??? Hypertension Mother    ??? Hepatitis Sister    ??? Melanoma Neg Hx       Social History     Socioeconomic History   ??? Marital status: Married   Occupational History   ??? Occupation: retired   Tobacco Use   ??? Smoking status: Every Day     Packs/day: 0.25     Years: 50.00     Pack years: 12.50     Types: Cigarettes     Start date: 06/16/1969   ??? Smokeless tobacco: Former     Types: Sports administrator  Quit date: 2009   Substance and Sexual Activity   ??? Alcohol use: No     Alcohol/week: 0.0 standard drinks   ??? Drug use: Not Currently     Types: Cocaine, IV, Marijuana     Comment: History of cocaine use years ago (intranasal, IV and crack), marijuana use.    ??? Sexual activity: Yes     Partners: Female   Other Topics Concern   ??? Do you use sunscreen? No   ??? Tanning bed use? No   ??? Are you easily burned? No   ??? Excessive sun exposure? Yes   ??? Blistering sunburns? Yes     Physical Examination:   BP 129/55 (BP Site: L Arm, BP Position: Sitting, BP Cuff Size: Medium)  - Pulse 61  - Temp 36.6 ??C (97.8 ??F) (Temporal)  - Wt 51.8 kg (114 lb 3.2 oz)  - SpO2 97%  - BMI 19.00 kg/m??   Gen: Thin chronically ill appearing gentleman in NAD, answers questions appropriately  Eyes: Sclera anicteric, EOMI  HENT: Bitemporal wasting  Abdomen: Soft, NTND, no rebound/guarding. Chest with spider angiomata.  Extremities: No clubbing, cyanosis, or edema in the BLEs  Neuro: Normal speech. No focal deficits  Psych: Alert, normal mood and affect.     Laboratory Studies:  Results for orders placed or performed in visit on 07/26/21   AFP tumor marker   Result Value Ref Range    AFP-Tumor Marker <2 <=8 ng/mL   PT-INR   Result Value Ref Range    PT 13.6 (H) 9.8 - 12.8 sec    INR 1.19    Comprehensive Metabolic Panel   Result Value Ref Range    Sodium 142 135 - 145 mmol/L    Potassium 4.8 3.4 - 4.8 mmol/L    Chloride 110 (H) 98 - 107 mmol/L    CO2 24.7 20.0 - 31.0 mmol/L    Anion Gap 7 5 - 14 mmol/L    BUN 26 (H) 9 - 23 mg/dL    Creatinine 1.61 0.96 - 1.10 mg/dL    BUN/Creatinine Ratio 30     eGFR CKD-EPI (2021) Male >90 >=60 mL/min/1.51m2    Glucose 96 70 - 179 mg/dL    Calcium 9.6 8.7 - 04.5 mg/dL    Albumin 4.1 3.4 - 5.0 g/dL    Total Protein 7.1 5.7 - 8.2 g/dL    Total Bilirubin 0.2 (L) 0.3 - 1.2 mg/dL    AST 22 <=40 U/L    ALT 17 10 - 49 U/L    Alkaline Phosphatase 36 (L) 46 - 116 U/L   CBC w/ Differential   Result Value Ref Range    WBC 5.0 3.6 - 11.2 10*9/L    RBC 3.71 (L) 4.26 - 5.60 10*12/L    HGB 13.4 12.9 - 16.5 g/dL    HCT 98.1 19.1 - 47.8 %    MCV 106.4 (H) 77.6 - 95.7 fL    MCH 36.2 (H) 25.9 - 32.4 pg    MCHC 34.0 32.0 - 36.0 g/dL    RDW 29.5 62.1 - 30.8 %    MPV 7.7 6.8 - 10.7 fL    Platelet 193 150 - 450 10*9/L    Neutrophils % 69.3 %    Lymphocytes % 21.9 % Monocytes % 6.8 %    Eosinophils % 1.1 %    Basophils % 0.9 %    Absolute Neutrophils 3.5 1.8 - 7.8 10*9/L    Absolute Lymphocytes 1.1 1.1 -  3.6 10*9/L    Absolute Monocytes 0.3 0.3 - 0.8 10*9/L    Absolute Eosinophils 0.1 0.0 - 0.5 10*9/L    Absolute Basophils 0.0 0.0 - 0.1 10*9/L     Assessment/Plan:   Mr. Mcsweeney is a 65 y.o. Caucasian male with decompensated HCV cirrhosis: PMH of cirrhosis with HE which appears controlled with total 30 ml of lactulose daily and Xifaxan 550 mg bid. There is no history of an overt decompensated event such as upper GI bleed or ascites. Fibroscan is consistent with F4 disease. Continued evidence of good synthetic liver function. Stable MELD score of 7. He follows with neurology and has had formal neuropsych testing which was consistent with cognitive impairment with widespread impairment but with significant variability, so there was some concern for a possible encephalopathy. He is currently on remeron and aricept. His greatest concern at this time is his continued weight loss despite unchanged appetite and nutritional supplementation with 3 boosts daily. Given his appropriate intake, concern for a hypermetabolic process. Follows with rad onc for history of prostate cancer and PSA is WNL. Follows with derm for history of basal cell carcinoma, no evidence of recurrence at last visit 10/2020. He is due this year for colonoscopy (2017 with 5mm adenomatous polyp, rec 5 year follow up). Recent CT A/P without acute  intraabdominal pathology. Also with stable pulmonary nodules and hilar lymphadenopathy and continues to smoke. He has never been seen by pulmonology. Will refer today for their review.     #Cirrhosis: Compensated. Stable MELD of 7.   - Lab ordered   - Office follow up six months   - Avoid alcohol use.   -HE: Continue lactulose 15 ml bid and Xifaxan 550 mg bid. Goal of two to three bowel movements daily.  -HCC screening Up to date. History of LRAD-2 and LRAD- 3 lesions in the setting of cirrhosis. Warrants surveillance every six months. Will repeat MRI of 09/2021, order placed.  -Variceal Screening: No history of varices. EGD 11/2019 without varices. Repeat upper endoscopy not necessarily recommended if remains well compensated given PLT count >150 and fibroscan <25 kPa.    #Severe protein calorie malnutrition: Encouraged continued high protein diet/protein supplementation. Can continue with 3 boosts daily. Also discussed use of high protein greek yogurt or use of protein powders which may be more affordable. History of pulmonary nodule in the setting of tobacco use. Ct 03/2021 with stable nodular opacity in R lung apex as well as stable 1.3 cm R hilar lymphadenopathy. He is due for colonoscopy this year. PSA WNL. CT A/P 11/25/20 without evidence of pancreatic lesion or ductal dilatation. HIV negative 10/13/20.  -Due for colonoscopy, order placed and patient given number to schedule  -History of prostate cancer, follows with rad onc and currently without evidence of disease. PSA <0.04 06/23/21  -Stable pulmonary nodules, referred to pulmonology given weight loss    Patient seen and discussed with Dr. Piedad Climes. Patient to follow-up in 6 months, sooner if any new issues.      Garnette Czech, MD  Gastroenterology & Hepatology Fellow  [place] [facility] [place]

## 2021-07-26 NOTE — Unmapped (Addendum)
Next steps:  - Please schedule your colonoscopy [phone] option 1 then option 2  - Continue high protein diet, can use protein powders and high protein greek yogurts in addition to Boost. Goal >70g protein daily  - We have referred you to the pulmonologist (lung doctor) to review your CT scan  - MRI ordered, due in February [phone]    Thank you for seeing [place] in clinic! If you do not have MyChart, please sign up at [email or web address] MyChart. MyChart will allow you to view the notes including next steps and test results. If you have questions before our next visit, please send me an electronic message though MyChart or call in at the number below. Please note electronic messaging is only for non-urgent questions and response times can vary (3-5 business days).    Carleene Cooper, MD  [facility] Gastroenterology and Hepatology Fellow    Important phone numbers:  GI Clinic Appointments:  [phone] option 1 then option 1  Please call the GI Clinic appointment line if you need to schedule, reschedule or cancel an appointment in clinic. They can also answer any questions you may have about where your appointment is located and when you need to arrive.      GI Procedure Appointments:  [phone] option 1 then option 2  Please call the GI Procedures line if you need to schedule, reschedule or cancel any type of GI procedure (endoscopy, colonoscopy, motility testing, etc).  You can also call this number for prep instructions, etc.      Radiology: [phone]  If you are being scheduled for any type of radiology study, you will need to call to receive your appointment time.  Please call this number for information.       For emergencies after normal business hours or on weekends/holidays   Proceed to the nearest emergency room OR contact the [facility] Operator at [phone] who can page the Gastroenterology Fellow on call.

## 2021-08-11 MED ORDER — PEG 3350-ELECTROLYTES 236 GRAM-22.74 GRAM-6.74 GRAM-5.86 GRAM SOLUTION
Freq: Once | ORAL | 0 refills | 1 days | Status: CP
Start: 2021-08-11 — End: 2021-08-11

## 2021-08-11 NOTE — Unmapped (Signed)
Colonoscopy  Procedure #1      0  Procedure #2      [id number]  MRN      0  Endoscopist      FALSE  Is the patient's health insurance [address], [place] [facility]), or [employer]?      FALSE  Urgent procedure      FALSE  Do you take: Plavix (clopidogrel), Coumadin (warfarin), Lovenox (enoxaparin), Pradaxa (dabigatran), Effient (prasugrel), Xarelto (rivaroxaban), Eliquis (apixaban), Pletal (cilostazol), or Brilinta (ticagrelor)?    FALSE  Do you have hemophilia, von Willebrand disease, thrombocytopenia?      FALSE  Do you have a pacemaker or implanted cardiac defibrillator?      FALSE  Are you pregnant?      FALSE  Has a [facility] provider specified the location(s)?        Which location(s) did the [facility] provider specify?      FALSE     [facility]      FALSE     [place]      FALSE     [facility]      FALSE     [facility]      FALSE  Is procedure indication for variceal banding (this does NOT include variceal screening)?              5  Height (feet)      6  Height (inches)      108  Weight (pounds)      17.4  BMI              FALSE  Did the ordering provider specify a bowel prep?      0       What bowel prep was specified?      FALSE  Do you have chronic kidney disease?      FALSE  Do you have chronic constipation or have you had poor quality bowel preps for past colonoscopies?      FALSE  Do you have Crohn's disease or ulcerative colitis?      FALSE  Have you had weight loss surgery?              FALSE  Are you in the process of scheduling or awaiting results of a heart ultrasound, stress test, or catheterization to evaluate new or worsening chest pain, dizziness, or shortness of breath?     FALSE  When you walk around your house or grocery store, do you have to stop and rest due to shortness of breath, chest pain, or light-headedness?      FALSE  Have you had a heart attack, stroke or heart stent placement within the past 6 months?      FALSE  Do you ever use supplemental oxygen? FALSE  Have you been hospitalized for cirrhosis of the liver or heart failure in the last 12 months?      FALSE  Have you been treated for mouth or throat cancer with radiation or surgery?      FALSE  Have you been told that it is difficult for doctors to insert a breathing tube in you during anesthesia?      FALSE  Have you had a heart or lung transplant?              FALSE  Are you on dialysis?      TRUE  Do you have cirrhosis of the liver?      FALSE  Do you have myasthenia gravis?      FALSE  Is the patient a prisoner?              FALSE  Have you been diagnosed with sleep apnea or do you wear a CPAP machine at night?      FALSE  Are you younger than 30?      FALSE  Have you previously received propofol sedation administered by an anesthesiologist for a GI procedure?      FALSE  Do you drink an average of more than 3 drinks of alcohol per day?      FALSE  Do you regularly take suboxone or any prescription medications for chronic pain?      FALSE  Do you regularly take Ativan, Klonopin, Xanax, Valium, lorazepam, clonazepam, alprazolam, or diazepam?      FALSE  Have you previously had difficulty with sedation during a GI procedure?      FALSE  Have you been diagnosed with PTSD?      FALSE  Are you allergic to fentanyl or midazolam (Versed)?      FALSE  Do you take medications for HIV?      ################### ################################################################################################################### ################ ############### ###############   MRN:          [id number]      Anticoag Review:  No      Nurse Triage:  No      GI Clinic Consult:  No      Procedure(s):  Colonoscopy 0     [facility]      Endoscopist:  0      Urgent:            No       Prep:               Nulytely              ################### ################################################################################################################### ################ ############### ###############

## 2021-08-12 DIAGNOSIS — R0602 Shortness of breath: Principal | ICD-10-CM

## 2021-08-12 NOTE — Unmapped (Signed)
[facility] Refill Coordination Note    Specialty Medication(s) to be Shipped:   Infectious Disease: Xifaxan    Other medication(s) to be shipped: golytely     Harold Richards, DOB: 24-Dec-1955  Phone: [phone] (home)       All above HIPAA information was verified with patient.     Was a Nurse, learning disability used for this call? No    Completed refill call assessment today to schedule patient's medication shipment from the [facility] [phone]).  All relevant notes have been reviewed.     Specialty medication(s) and dose(s) confirmed: Regimen is correct and unchanged.   Changes to medications: Harold Richards reports no changes at this time.  Changes to insurance: No  New side effects reported not previously addressed with a pharmacist or physician: None reported  Questions for the pharmacist: No    Confirmed patient received a Conservation officer, historic buildings and a Surveyor, mining with first shipment. The patient will receive a drug information handout for each medication shipped and additional FDA Medication Guides as required.       DISEASE/MEDICATION-SPECIFIC INFORMATION        N/A    SPECIALTY MEDICATION ADHERENCE     Medication Adherence    Patient reported X missed doses in the last month: 0  Specialty Medication: XIFAXAN 550 mg  Patient is on additional specialty medications: No              Were doses missed due to medication being on hold? No    XIFAXAN  8 days worth of medication on hand.      REFERRAL TO PHARMACIST     Referral to the pharmacist: Not needed      [facility]     Shipping address confirmed in Epic.     Delivery Scheduled: Yes, Expected medication delivery date: 08/17/21.     Medication will be delivered via Next Day Courier to the prescription address in Epic WAM.    Harold Richards   [facility] Specialty Technician

## 2021-08-13 ENCOUNTER — Ambulatory Visit: Admit: 2021-08-13 | Discharge: 2021-08-14 | Payer: PRIVATE HEALTH INSURANCE

## 2021-08-16 DIAGNOSIS — G9340 Encephalopathy, unspecified: Principal | ICD-10-CM

## 2021-08-17 MED FILL — PEG 3350-ELECTROLYTES 236 GRAM-22.74 GRAM-6.74 GRAM-5.86 GRAM SOLUTION: ORAL | 1 days supply | Qty: 4000 | Fill #0

## 2021-08-17 MED FILL — XIFAXAN 550 MG TABLET: ORAL | 30 days supply | Qty: 60 | Fill #3

## 2021-08-18 ENCOUNTER — Ambulatory Visit: Admit: 2021-08-18 | Discharge: 2021-08-19 | Payer: PRIVATE HEALTH INSURANCE

## 2021-08-18 DIAGNOSIS — E43 Unspecified severe protein-calorie malnutrition: Principal | ICD-10-CM

## 2021-08-18 DIAGNOSIS — F17200 Nicotine dependence, unspecified, uncomplicated: Principal | ICD-10-CM

## 2021-08-18 DIAGNOSIS — J438 Other emphysema: Principal | ICD-10-CM

## 2021-08-18 NOTE — Unmapped (Signed)
[facility]  Pulmonary Clinic - Initial Visit    Referring Physician :  Annie Paras  PCP:     [name], MD  Reason for Consult:   Weight loss, RUL nodule    ASSESSMENT and PLAN     Harold Richards is a 66 y.o. male smoker (0.5-2ppd x59yr) with Hep C cirrhosis, moderate cognitive impairment, androgen sensitive prostate cancer s/p radiation who was referred for worsening severe protein calorie malnutrition w/ known RUL lesion & increased dyspnea. PFTs demonstrated normal lung volumes with borderline elevated RV suggestive of gas trapping, no evidence of obstruction and moderately reduced DLCO iso known emphysematous changes on CT imaging. Most recent CT chest performed 03/2021 re-demonstrated RUL subcentimeter nodule/scarring w/ 1.3 cm LAD, which is unchanged from 2021. I discussed imaging with Dr. Adalberto Cole in radiology who confirmed that subcentimeter nodule would be below the resolution of PET scan and that this lesion is not cancerous given its stability over serial scans. I think it is reasonable to repeat chest imaging to confirm that no adenopathy is present compared to last scan given his known prostate cancer. He has no alarming symptoms of fevers, night sweats, productive cough or hemoptysis.   Without obstruction on PFTs, it is difficult to give him a diagnosis of COPD. With his continued smoking, Trelegy is less effective than it would be otherwise. I worry that much of his dyspnea is from deconditioning and loss of muscle mass.   Based on my discussions with the patient & his spouse, I am concerned that his cognitive impairment is a big factor in his progressive weight loss. His weight loss appears to have been initially insidious, dating back to 2019 according to his wife. Since his retirement, he is at home alone for much of the day. His wife does not know if he fixes himself lunch and states that he doesn't eat dinner unless she fixes it and brings it to him. His wife Harold Richards even expressed mild exasperation with his tendency to forget to eat. Although he is drinking protein shakes, it appears he is using these as meal replacement rather than supplementation. Harold Richards states that where he used to eat a whole burger, now he would only eat half or just pick at his food. She has not noticed a significant change on the mirtazipine.     Recommendations:  - Reasonable to repeat chest imaging to assess for new adenopathy/nodules concerning for malignancy  - Continue trelegy as prescribed by PCP  - Will discuss care with Harold Richards's providers in hepatology, internal medicine, geriatrics/neurology [id number] about neurocognitive impairment contributing to progressive weight loss  - As discussed with his spouse, I will refer him for nutrition evaluation at [facility] for severe malnutrition    Harold Richards was seen today for new patient.    Diagnoses and all orders for this visit:    Tobacco use disorder  -     CT Chest W Contrast; Future    Severe protein-calorie malnutrition (CMS-HCC)  -     Ambulatory referral to Pulmonology  -     CT Chest W Contrast; Future  -     Ambulatory referral to Nutrition Services; Future    Other emphysema (CMS-HCC)  -     CT Chest W Contrast; Future  -     Ambulatory referral to Nutrition Services; Future      Plan of care was discussed with the patient who acknowledged understanding and is in  agreement.    Patient will Return in about 3 months (around 11/16/2021). or sooner if needed.      Harold Richards was seen, examined and discussed with Dr. Linward Headland who agrees with the assessment and plan above.     ZO:XWRU Myrtis Ser, [name], MD    Nabeeha Badertscher E. Talmadge Coventry, MD  Pulmonary & Critical Care Fellow        HISTORY:      History of Present Illness: Harold Richards is a 66 y.o. male current 0.5 pack/day smoker (>60 pack year) with PMHx cirrhosis 2/2 Hep C, prostate ca, cognitive impairment who is seen in consultation at the request of Annie Paras,* for comprehensive evaluation of weight loss, Hx pulmonary nodule, dyspnea & chronic productive cough. Unaccompanied today, but gave me permission to call his wife after our visit to discuss his care. Harold Richards notes a history of cough that is sometimes productive of thick sputum; this has been present for many years and has not changed much recently. No hemoptysis. No fevers, chills or night sweats. He does wake up with cough at night occasionally. He feels that he gets out of breath more easily than he used to. He used to be able to walk up stairs without difficulty but now in the last few months must stop to rest on his way up if he is carrying something. 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 He has occasional dizziness on standing which resolves after a few moments, but no exertional lightheadedness. He has been on Trelegy since his last admission with pneumonia in Jan 2022 which required a stay overnight in the hospital for IV antibiotics. Prior to this he had had an exacerbation in Nov 2021 according to his spouse. Harold Richards is unable to give me many details about his prior hospitalizations. No prior intubations for COPD. Harold Richards has had progressive unintentional weight loss, worse over the last few months. Thinks he has lost 20 lbs. He feels he is eating the same as always - though he worries that he is eating more candy since his retirement. He is drinking 32 oz of protein shakes per day.    Called spouse Harold Richards to get her perspective. She tells me he's been dizzy a lot on standing and out of breath going up a flight of stairs. He has had significant weight loss starting in 2019. At that time, he had prostate checked, had prostate cancer, had radiation and radioactive seed therapy & lupron. She initially says that his eating habits haven't changed. Typpically drinking 4-5 boost shakes per day at ~300 cal each. For breakfast he might eat a honeybun, and he may or may not eat lunch. He will eat dinner when Harold Richards fixes it and brings it, but if she is not at home, he will not fix dinner for himself. She is still working - but Harold Richards is at home all day as he is now retired. She notes that she's not sure if he does eat lunch when she's not home. Multiple times she's come home on weekend days to find that it's 3 or 4 o'clock and he hasn't made anything, says he was waiting for her. She finds it frustrating that he could just forget to eat. When she is fixing meals, she notices that he is picking at his food and eating less.  Nowadays, he might only eat half a burger, where 5 years ago he would eat a whole one. He has always been petit, often skipping meals. He weight at most 150# when they had first gotten together.   She feels his cough is the same smoker's cough he's always had. He is taking remeron & gabapentin, for sleep and appetite, but she still feels like he is tired all the time. He has no energy. They went to [place] [employer] [place] a trip and walked around in the park with their grandkids but afterwards Harold Richards needed 2 weeks to recover, sleeping for long stretches of the day. She was very surprised by how poorly he performed in the neurological testing, although she had been noticing his memory difficulties beforehand. She feels like his symptoms have gotten worse. He loses his keys frequently, puts them down in various places throughout the house and can't remember where he left them.     Smoking 0.5ppd avoiding smoking in house & truck, has been cutting back. Started smoking age 42, mostly a pack or 2 per day.     Symptoms [facility]): 2-3  Exacerbations (including abx/pred use): 2-3 in last 18 months  Hospitalizations: Yes needing IV abx overnight for one of the pneumonias in 2021  Intubations: No  Alpha-1 Testing: [ ]  consider screening  Functional status: partially dependent  Oxygen use: None  Inhalers: Trelegy  Medications: flonase, remeron, gabapentin  Pulmonary Rehab: not at this time  Lung Cancer screening: performed  - USPSTF recommends annual screening for lung cancer with low-dose computed tomography in adults ages 106 to 54 years who have a 20 pack-year smoking history and currently smoke or have quit within the past 15 years. Screening should be discontinued once a person has not smoked for 15 years or develops a health problem that substantially limits life expectancy or the ability or willingness to have curative lung surgery  Smoking: Smoking since age 44, 1-2 ppd, down to 0.5 ppd in the last year by limiting where he allows himself to smoke  Goals of care: Not addressed this visit    Vaccinations:   Immunization History   Administered Date(s) Administered   ??? COVID-19 VACCINE,MRNA(MODERNA)(PF) 10/07/2019, 11/05/2019, 06/21/2020, 12/01/2020   ??? Hepatitis A 07/28/2016, 01/26/2017   ??? Hepatitis A Vaccine Pediatric / Adolescent 2 Dose IM 03/05/2014   ??? Influenza Vaccine Quad (IIV4 PF) 95mo+ injectable 06/02/2014, 06/16/2016, 04/19/2019   ??? Influenza Virus Vaccine, unspecified formulation 05/18/2017, 05/24/2018, 05/25/2020   ??? PNEUMOCOCCAL POLYSACCHARIDE 23 03/05/2014   ??? TdaP 04/19/2019     OSH records and referral documentation reviewed.     Review of Systems:  A comprehensive review of systems was completed and negative except as noted in HPI.    PMHx:  Hepatitis C  Cirrhosis  Moderate cognitive impairment    PSHx:  Multiple endoscopies  Cervical fusion    FHx:   Mother w/ lung cancer   Aunt w/ lung cancer  Grandfarther w/ lung cancer  Uncle w/ cirrhosis  Two daughters, both healthy.     Social   Smoking (cigarettes, vaping, marijuana) - as above.   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 Now stays at home during the day, spouse is still working.     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 Mostly not wearing masks. Exposures for  hours to days at a time. No known water damage/mould exposure.    Pets: No birds and dog(s)    Allergies: Reviewed.    Other History:  The past medical history, surgical history, social history, family history, medications and allergies were personally reviewed and updated in the patient's electronic medical record. Pertinent items are noted above.     Other History:  The social history and family history were personally reviewed and updated in the patient's electronic medical record.        PHYSICAL EXAM:      Physical Exam:  Vitals:    08/18/21 0852   BP: 97/50   Pulse: 53   Resp: 16   Temp: 36.2 ??C (97.2 ??F)   TempSrc: Temporal   SpO2: 98%   Weight: 51.4 kg (113 lb 6.4 oz)   Height: 165.1 cm (5' 5)     Body mass index is 18.87 kg/m??.    General: Cachectic, Non-toxic. NAD. Forgetful  Eyes: PERRL. Anicteric sclera.   ENT:  Nasal mucosa clear. No polyps seen. No crusting.  Lymph: No cervical or supraclavicular lymphadenopathy.  Respiratory: No wheezing, faint crackles bilaterally, comfortable WOB on RA  Cardiovascular: bradycardic RR, +s1/s2, Normal P2, no RV heave, No MRG,. No edema  Abdomen: NABS, Soft, Non-tender, non-distended.  Musculoskeletal/extremities: Normal muscle tone and strength. No cyanosis. No clubbing.   Skin: No skin rashes on clothed exam  Neuro: Alert and oriented x 3. CN grossly intact. No focal neurological deficits  Psych: mood anxious, affect congruent         LABORATORY and RADIOLOGY DATA:     Pulmonary Function Tests/Interpretation:        Pulmonary Function Test Results  Date FVC FEV1 FEV1/FVC TLC DLCO Other   12/21/17 3.15 (73%)//3.36 (78%) 2.29 (70%)//2.47 (76%) 73 // 73      08/13/21 3.48 (92%)//3.52 (93%) 2.59 (88%)//2.72 (92% 74 // 77 6.07 (98%) 14.57 (63%) RV 2.59 (112%)                PSG w titration 12/2019: CPAP 16 cm H2O  PSG 10/2019: Mod OSA a/ AHI 24.7 events/hr a/w desats to 93%, arousals & disruptions of sleep architecture    Pertinent Laboratory Data:  CBC, CMP, PT/INR, AFP & PSA dated 12/19 reviewed in epic.    Pertinent Imaging Data:  Images were personally reviewed with attending.   CT chest 03/2021, 09/2020, 12/2019: RUL nodular opacity 0.6 cm, stable over interval follow up, stable 1.3 cm nodular adenopathy    NM myocardial stress: No ischemia, mildly hyperdynamic  Echo, 2019: mildly dilated LA, preserved EF, normal RV size/fxn, mildly elevated RAP by IVC index  [place], 2019: pAF

## 2021-08-18 NOTE — Unmapped (Addendum)
It was a pleasure meeting you in clinic today! You were seen in clinic today by Dr. Talmadge Coventry & Dr. Doralee Albino    Here's a summary of what we discussed during your visit:  I will discuss with radiology and likely order a PET scan  I will discuss with interventional pulmonary  Keep up the good work with nutrition  If your breathing feels worse, coughing up more sputum, reach out to the clinic.  No change in medications for your breathing today  STOP SMOKING!  Keep up the good work with taking your medications, using your inhalers, exercising, and eating healthily      Follow up with me in 3 months!    Harold Richards E. Talmadge Coventry, MD  [facility] Pulmonary & Critical Care Medicine      To contact your care team, you can either send a message via MyChart or contact the clinic at [phone].    How do I request medication refills?  Request a refill via [facility] (patient portal), call clinic at [phone] or have your pharmacy fax the request to [phone].    [facility]: [phone] *Pharmacy can mail medications to your home. You must call to request the medication be mailed.[facility]: [phone]  [facility]: [phone]    Here are some things you should know about contacting the [facility]monary [facility]:  Please be advised Epic now releases test results to MyChart as soon as they are available which means you will see your test results before I do.    The best way to reach your doctor for non-urgent matters is through MyChart. I can usually respond within two to three business day but I do not check messages after hours (evenings, weekends, and holidays) and often have other duties (inpatient hospital work, Producer, television/film/video activities, teaching) that make me unavailable.    - If you have sent a MyChart message to the clinic and have not received a response after three business days, please call our clinic at [phone] to speak to a nurse.     If you have an urgent issue that you feel needs a response the same day, you should also contact your primary care provider or be evaluated at an Urgent Care clinic.    For urgent lung issues after hours, you can call the hospital operator [phone]) and ask for the Pulmonary Fellow On Call. This doctor can provide some guidance and will send a message to your regular lung doctor the next morning.    If there is an emergency, call 911 or go to your closest emergency room.    I don't have a MyChart. Why should I get one?   - It's encrypted, so your information is secure  - It's a quick, easy way to contact the care team, manage appointments, see test results, and more!    How do I sign-up for MyChart?   - Download the MyChart app from the [employer] or [employer] and sign-up in the app  - Sign-up online at [email or web address]

## 2021-08-20 NOTE — Unmapped (Signed)
I saw and evaluated the patient, participating in the key portions of the service.  I reviewed the resident???s note.  I agree with the resident???s findings and plan.     - Patient with complaints of dyspnea with h/o cirrhosis, prostate cancer, continued smoking and significant weight loss. Agree with his need for nutritional evaluation/counseling. CT imaging to evaluate possible adenopathy and other appropriate cancer screening.  Stable, small RUL nodule.    Epimenio Sarin, MD

## 2021-08-26 NOTE — Unmapped (Signed)
Voicemail unavailable. Reached out via text & Mychart.    Harold Richards  08/26/21

## 2021-08-27 ENCOUNTER — Ambulatory Visit: Admit: 2021-08-27 | Discharge: 2021-08-28 | Payer: PRIVATE HEALTH INSURANCE

## 2021-08-27 MED ADMIN — iohexoL (OMNIPAQUE) 350 mg iodine/mL solution 75 mL: 75 mL | INTRAVENOUS | @ 19:00:00 | Stop: 2021-08-27

## 2021-08-31 ENCOUNTER — Institutional Professional Consult (permissible substitution)
Admit: 2021-08-31 | Discharge: 2021-09-01 | Payer: PRIVATE HEALTH INSURANCE | Attending: Registered" | Primary: Registered"

## 2021-08-31 DIAGNOSIS — J438 Other emphysema: Principal | ICD-10-CM

## 2021-08-31 DIAGNOSIS — E43 Unspecified severe protein-calorie malnutrition: Principal | ICD-10-CM

## 2021-08-31 NOTE — Unmapped (Signed)
Patient Stated Health/Nutrition Goals:  Meet nutritional needs   Reduce long-term health risk    Identified treatment goals: Not applicable    Nutrition Interventions (ADVISE/ASSIST):  1. Goal of 300-400 calories six times a day   A. Premier mixed in 16 oz of whole milk is 450 calories, 54 grams of protein   B. Whole grain bread/crackers with 2 tbsp of peanut butter   C. 1/2 cup of nuts, fruit

## 2021-08-31 NOTE — Unmapped (Signed)
Enhanced Care Nutrition-Initial Assessment    Nutrition Assessment:  Patient is a very pleasant 66 y.o. who comes today desiring to gain weight. Patient has noticed his weight loss. He reports his appetite isn't there and sometimes will go a day without eating. We discussed the importance of more regular eating. Patient likely will not tolerate larger amount of food at any one time. We discussed that likely easier to chew/eat foods will be better tolerated. Patient had just started using a protein powder in milk this past week. We discussed patient needs an overall increase in calories, not just protein.    Nutrition Diagnosis:  Inadequate Energy Intake as related to poor appetite as evidenced by weight loss    Patient Stated Health/Nutrition Goals:  Meet nutritional needs   Reduce long-term health risk    Identified treatment goals: Not applicable    Nutrition Interventions (ADVISE/ASSIST):  1. Goal of 300-400 calories six times a day   A. Premier mixed in 16 oz of whole milk is 450 calories, 54 grams of protein   B. Whole grain bread/crackers with 2 tbsp of peanut butter   C. 1/2 cup of nuts, fruit    ______________________________________________________________________  Referring MD or Clinic:   Myrtice Lauth, MD    Reason for Referral:   1. Severe protein-calorie malnutrition (CMS-HCC)    2. Other emphysema (CMS-HCC)        Medical History:  Past Medical History:   Diagnosis Date   ??? Actinic keratosis    ??? Asthma    ??? Basal cell carcinoma    ??? Chronic headaches    ??? Colon polyp    ??? Enlarged prostate    ??? GERD (gastroesophageal reflux disease) 02/08/2013   ??? Hepatitis C    ??? Hx of substance abuse (CMS-HCC)     hx of recreational drug use (cocaine IV, intranasal and crack),    ??? Infectious viral hepatitis    ??? Lung nodule seen on imaging study 07/12/2016   ??? Osteoporosis    ??? Other emphysema (CMS-HCC)    ??? Renal calculus    ??? Skin cancer    ??? Tobacco abuse 02/08/2013 Anthropometrics:    Present Weight 50.3 kg (111 lb) (with cowboy boots) Current BMI 18.47   Goal Weight   120# Current IBW 61.77 kg   Present Height 165.1 cm (5' 5)       Weight History:  Wt Readings from Last 6 Encounters:   08/31/21 50.3 kg (111 lb)   08/18/21 51.4 kg (113 lb 6.4 oz)   07/26/21 51.8 kg (114 lb 3.2 oz)   06/23/21 54.3 kg (119 lb 12.8 oz)   04/14/21 56.3 kg (124 lb 3 oz)   03/23/21 55.9 kg (123 lb 4.8 oz)       Medications, Herbs, Supplements:  Reviewed nutritionally relevant medications and supplements.    Recent Labs:   Hemoglobin A1C (%)   Date Value   02/08/2013 5.3      No components found for: LDLCALC   BP Readings from Last 3 Encounters:   08/18/21 97/50   07/26/21 129/55   06/23/21 109/60     Lab Results   Component Value Date    CHOL 138 08/16/2016    CHOL 113 02/08/2013     Lab Results   Component Value Date    HDL 41 08/16/2016    HDL 30 (L) 02/08/2013     Lab Results   Component Value Date  LDL 80 08/16/2016    LDL 70 02/08/2013     Lab Results   Component Value Date    VLDL 17.4 08/16/2016     Lab Results   Component Value Date    CHOLHDLRATIO 3.4 08/16/2016     Lab Results   Component Value Date    TRIG 87 08/16/2016    TRIG 65 02/08/2013       Physical Activity:  Daily activities    Dietary Restrictions, Food Intolerances:  None    Other GI Issues:  Frequent stool-taking lactulose    Allergies:   Allergies   Allergen Reactions   ??? Bicalutamide Rash   ??? Penicillins Rash   ??? Rocephin [Ceftriaxone] Rash     Patient received in Emergency Room and reported rash to stomach and back several hours later.        24-Hour recall/usual intake:  1st Water  2nd 2 cheese hot dogs with chili mustard on buns, Pepsi  3rd Meatloaf, creamed potatoes, peas, roll, Pepsi  Snacks Hershey's Bar: 2-3    Other Usual Intake:  1st 10-11 AM: Cheese [place]  2nd 1-2 PM: Cheese dogs sometimes fries (Goes to [employer])  3rd 6-7 PM: Protein, starch, vegetables; Stews  Snacks chocolate, candy  Beverages: Pepsi, Protein Shakes (Premier protein powder mixed in whole milk-2)  Eating out: Daily  Cooking: Wife  Grocery shopping: Wife  Grocery shops at: [employer], [employer] benefits: None  Working Emergency planning/management officer, Engineer, 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    Behavioral Risk Factors:  N/A  Fast Eating  N/A  Overeating  N/A    Hunger and Satiety Issues:  Appetite: Not good    Patient Questions:  Put weight back on     Estimated Needs:  Estimated Energy Needs: 1800-2100 kcal    Estimated Protein Needs: 80-90 gm/day    Materials Provided To Meet their identified goals:  List of recommendations    Food labels discussed: Yes    Expected Compliance/Barriers are:   Comprehension of plan good  Readiness for change good  Ability to meet goals good    We assessed family/social/cultural characteristics and these were relevant to care: none evident    Patient has auditory or visual communication barrier or need: no    Interventions Codes:  General/healthful diet     Follow-up (ARRANGE):  4 weeks      Length of visit was 40 minutes. Patient understands diagnosis and treatment plan. Patient voices understanding. Plan is consistent with the patient???s preferences and goals.

## 2021-09-01 ENCOUNTER — Encounter
Admit: 2021-09-01 | Discharge: 2021-09-01 | Payer: PRIVATE HEALTH INSURANCE | Attending: Anesthesiology | Primary: Anesthesiology

## 2021-09-01 ENCOUNTER — Ambulatory Visit: Admit: 2021-09-01 | Discharge: 2021-09-01 | Payer: PRIVATE HEALTH INSURANCE

## 2021-09-01 MED ADMIN — propofoL (DIPRIVAN) injection: INTRAVENOUS | @ 14:00:00 | Stop: 2021-09-01

## 2021-09-01 MED ADMIN — sodium chloride (NS) 0.9 % infusion: 10 mL/h | INTRAVENOUS | @ 13:00:00 | Stop: 2021-09-01

## 2021-09-01 MED ADMIN — lidocaine (XYLOCAINE) 20 mg/mL (2 %) injection: INTRAVENOUS | @ 14:00:00 | Stop: 2021-09-01

## 2021-09-01 NOTE — Unmapped (Signed)
Addendum  created 09/01/21 1252 by Lorrene Reid, MD    Attestation recorded in Intraprocedure, Intraprocedure Attestations filed, Optician, dispensing edited

## 2021-09-02 NOTE — Unmapped (Signed)
I was the supervising physician in the delivery of the service. Davita Sublett D [place] Flett, MD

## 2021-09-09 ENCOUNTER — Ambulatory Visit: Admit: 2021-09-09 | Discharge: 2021-09-09 | Payer: PRIVATE HEALTH INSURANCE

## 2021-09-09 MED ADMIN — gadobenate dimeglumine (MULTIHANCE) 529 mg/mL (0.1mmol/0.2mL) solution 4.5 mL: 4.5 mL | INTRAVENOUS | @ 19:00:00 | Stop: 2021-09-09

## 2021-09-10 ENCOUNTER — Ambulatory Visit: Admit: 2021-09-10 | Payer: PRIVATE HEALTH INSURANCE

## 2021-09-13 NOTE — Unmapped (Signed)
[facility] Clinical Assessment & Refill Coordination Note    Harold Richards, [place]: 1956-03-10  Phone: [phone] (home)     All above HIPAA information was verified with patient.     Was a Nurse, learning disability used for this call? No    Specialty Medication(s):   Infectious Disease: Xifaxan     Current Outpatient Medications   Medication Sig Dispense Refill   ??? albuterol HFA 90 mcg/actuation inhaler Inhale 2 puffs every four (4) hours as needed for wheezing. 8 g 3   ??? aspirin (ECOTRIN) 81 MG tablet Take 81 mg by mouth daily.     ??? atorvastatin (LIPITOR) 20 MG tablet Take 1 tablet (20 mg total) by mouth nightly. 90 tablet 3   ??? cholecalciferol, vitamin D3-25 mcg, 1,000 unit,, 25 mcg (1,000 unit) capsule Take 1,000 Units by mouth daily.     ??? donepeziL (ARICEPT) 5 MG tablet Take 2 tablets (10 mg total) by mouth in the morning. 90 tablet 3   ??? ferrous sulfate 325 mg (65 mg iron) CpER Take 1 tablet by mouth daily.      ??? fluticasone propionate (FLONASE) 50 mcg/actuation nasal spray 1 spray into each nostril nightly. 16 g 0   ??? fluticasone-umeclidin-vilanter (TRELEGY ELLIPTA) 100-62.5-25 mcg inhaler Inhale 1 puff daily. 60 each 3   ??? gabapentin (NEURONTIN) 100 MG capsule Take 1 capsule (100 mg total) by mouth nightly. 90 capsule 3   ??? guaiFENesin (MUCINEX) 600 mg 12 hr tablet Take 600 mg by mouth every morning.     ??? lactulose (CHRONULAC) 10 gram/15 mL solution Take 15 mL (10 g total) by mouth Two (2) times a day. 900 mL 6   ??? loratadine (CLARITIN) 10 mg tablet Take 10 mg by mouth daily.     ??? mirtazapine (REMERON) 15 MG tablet Take 1 tablet (15 mg total) by mouth nightly. 90 tablet 3   ??? multivitamin (MULTIPLE VITAMIN ORAL) Take 1 tablet by mouth daily.     ??? oxybutynin (DITROPAN XL) 5 MG 24 hr tablet Take 1 tablet (5 mg total) by mouth daily. 30 tablet 11   ??? rifAXIMin (XIFAXAN) 550 mg Tab Take 1 tablet (550 mg total) by mouth Two (2) times a day. 60 tablet 5   ??? sildenafiL (VIAGRA) 50 MG tablet Take 1 tablet (50 mg total) by mouth daily as needed for erectile dysfunction. 30 tablet 1   ??? SUBOXONE 8-2 mg sublingual film Place 1 Film under the tongue Two (2) times a day.   0   ??? tamsulosin (FLOMAX) 0.4 mg capsule Take 1 capsule (0.4 mg total) by mouth two (2) times a day. 180 capsule 1     No current facility-administered medications for this visit.        Changes to medications: Harold Richards reports no changes at this time.    Allergies   Allergen Reactions   ??? Bicalutamide Rash   ??? Penicillins Rash   ??? Rocephin [Ceftriaxone] Rash     Patient received in Emergency Room and reported rash to stomach and back several hours later.        Changes to allergies: No    SPECIALTY MEDICATION ADHERENCE     Xifaxan 550 mg: 6 or 7 days of medicine on hand     Medication Adherence    Patient reported X missed doses in the last month: 0  Specialty Medication: Xifaxan 550 mg  Patient is on additional specialty medications: No  Any gaps  in refill history greater than 2 weeks in the last 3 months: no  Demonstrates understanding of importance of adherence: yes  Informant: patient  Patient is at risk for Non-Adherence: No          Specialty medication(s) dose(s) confirmed: Regimen is correct and unchanged.     Are there any concerns with adherence? No    Adherence counseling provided? Not needed    CLINICAL MANAGEMENT AND INTERVENTION      Clinical Benefit Assessment:    Do you feel the medicine is effective or helping your condition? Yes    Clinical Benefit counseling provided? Not needed    Adverse Effects Assessment:    Are you experiencing any side effects? No    Are you experiencing difficulty administering your medicine? No    Quality of Life Assessment:    How many days over the past month did your hepatic encephalopathy  keep you from your normal activities? For example, brushing your teeth or getting up in the morning. 0    Have you discussed this with your provider? Not needed    Acute Infection Status:    Acute infections noted within Epic:  No active infections  Patient reported infection: None    Therapy Appropriateness:    Is therapy appropriate and patient progressing towards therapeutic goals? Yes, therapy is appropriate and should be continued    DISEASE/MEDICATION-SPECIFIC INFORMATION      N/A    PATIENT SPECIFIC NEEDS     - Does the patient have any physical, cognitive, or cultural barriers? No    - Is the patient high risk? No    - Does the patient require a Care Management Plan? No       SHIPPING     Specialty Medication(s) to be Shipped:   Infectious Disease: Xifaxan    Other medication(s) to be shipped: No additional medications requested for fill at this time     Changes to insurance: No    Delivery Scheduled: Yes, Expected medication delivery date: 09/15/21.     Medication will be delivered via Next Day Courier to the confirmed prescription address in [facility].    The patient will receive a drug information handout for each medication shipped and additional FDA Medication Guides as required.  Verified that patient has previously received a Conservation officer, historic buildings and a Surveyor, mining.    The patient or caregiver noted above participated in the development of this care plan and knows that they can request review of or adjustments to the care plan at any time.      All of the patient's questions and concerns have been addressed.    Harold Richards   [facility] Specialty Pharmacist

## 2021-09-14 MED FILL — XIFAXAN 550 MG TABLET: ORAL | 30 days supply | Qty: 60 | Fill #4

## 2021-09-20 NOTE — Unmapped (Unsigned)
ASSESSMENT / PLAN          +++++++++++++++++++++++++++++++++++++++++++++    SUBJECTIVE    New dxa    HPI: Harold Richards is a 66 y.o. male with osteoporosis/ cirrhosis/ prostate (lupron) Ca last seen 10-2020    Not on lupron now.  Retired in October 2021 - he is quite happy with this.    Wife 2021 dx with breast cancer  Doing a little better.    No fractures, really feeling pretty well. He says he is forgetful, and couldn't remember sequence for pumping gas. Thus his wife had him see neurology   W/u so far negative.    He otherwise looks after his 2 horses (3 passed on in the last year) - he has 3 grandchildren who he spends a lot of enjoyable time with.    1. Osteoporosis of lumbar spine   - Multiple contributors potential for spinal osteoporosis - including cirrhosis (testosterone), kidney stones (PTH, renal leak)    ?holiday?    Treatment  Reclast - 2018, 2019, 10/2019, 11/2020  X 4    Bone History  Rib fracture x 2 in 2015 - fell off truck  Still working - does okay.  Is exhausted when he is not at work.  Sexual function is decreased - able to have erection. No change in shaving.  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  Proximal left femur  -1.8. Femoral neck density  -1.7. ??  2020   L1 to 4  -2.3.   Proximal left femur -1.9.  Femoral neck  -1.9.       New   09/09/2021  Spine -2.1  Left Total Hip -1.9     Femoral Neck -1.2                2. Gastroesophageal reflux disease    3. 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 Prostate cancer x2019   Dx: high??risk prostate cancer, cT1c, Gleason??4+4=8??in 2/12??cores, PSA 4.59. EBRT x 5 wks + Brachytherapy + 18 mon ADT.    Also Lupron q 3 months.  Seen 09/2020 thought to be stable    Pertinent Medical History:   Sialolithiasis of submandibular gland - excised 2016  Positive recreational drug use (cocaine IV, intranasal and crack) --clean x 10 years  Kidney stones -  Last was 2015 x 2 -  No family history. No scan  2017  ALK 50, calcium 9.6, MCV 106, Vitamin D 55    REVIEW OF SYSTEMS:negative except for as stated in HPI.  Note stones.  Hepatitis C - with some degree of compensated cirrhosis but patient does not have any symptoms.    Social History:smoker, married. Grown children 30s   Works at [employer] in [place]  Retired in October 2021 - he is quite happy with this.    Wife 2021 dx with breast cancer  Doing a little better.  looks after 2 horses  //  he has 3 grandchildren who he spends a lot of enjoyable time with.    Family history: parents - none with hip fracture - grandmother with hip fracture.  Mother lung cancer death, dad still living age 6  Brother deceased age 91, Sister at age 97 - both car accidents.    Current Outpatient  Medications:   ???  albuterol HFA 90 mcg/actuation inhaler, Inhale 2 puffs every four (4) hours as needed for wheezing., Disp: 8 g, Rfl: 3  ???  aspirin (ECOTRIN) 81 MG tablet, Take 81 mg by mouth daily., Disp: , Rfl:   ???  atorvastatin (LIPITOR) 20 MG tablet, Take 1 tablet (20 mg total) by mouth nightly., Disp: 90 tablet, Rfl: 3  ???  cholecalciferol, vitamin D3-25 mcg, 1,000 unit,, 25 mcg (1,000 unit) capsule, Take 1,000 Units by mouth daily., Disp: , Rfl:   ???  donepeziL (ARICEPT) 5 MG tablet, Take 2 tablets (10 mg total) by mouth in the morning., Disp: 90 tablet, Rfl: 3  ???  ferrous sulfate 325 mg (65 mg iron) CpER, Take 1 tablet by mouth daily. , Disp: , Rfl:   ???  fluticasone propionate (FLONASE) 50 mcg/actuation nasal spray, 1 spray into each nostril nightly., Disp: 16 g, Rfl: 0  ???  fluticasone-umeclidin-vilanter (TRELEGY ELLIPTA) 100-62.5-25 mcg inhaler, Inhale 1 puff daily., Disp: 60 each, Rfl: 3  ???  gabapentin (NEURONTIN) 100 MG capsule, Take 1 capsule (100 mg total) by mouth nightly., Disp: 90 capsule, Rfl: 3  ???  guaiFENesin (MUCINEX) 600 mg 12 hr tablet, Take 600 mg by mouth every morning., Disp: , Rfl:   ???  lactulose (CHRONULAC) 10 gram/15 mL solution, Take 15 mL (10 g total) by mouth Two (2) times a day., Disp: 900 mL, Rfl: 6  ???  loratadine (CLARITIN) 10 mg tablet, Take 10 mg by mouth daily., Disp: , Rfl:   ???  mirtazapine (REMERON) 15 MG tablet, Take 1 tablet (15 mg total) by mouth nightly., Disp: 90 tablet, Rfl: 3  ???  multivitamin (MULTIPLE VITAMIN ORAL), Take 1 tablet by mouth daily., Disp: , Rfl:   ???  oxybutynin (DITROPAN XL) 5 MG 24 hr tablet, Take 1 tablet (5 mg total) by mouth daily., Disp: 30 tablet, Rfl: 11  ???  rifAXIMin (XIFAXAN) 550 mg Tab, Take 1 tablet (550 mg total) by mouth Two (2) times a day., Disp: 60 tablet, Rfl: 5  ???  sildenafiL (VIAGRA) 50 MG tablet, Take 1 tablet (50 mg total) by mouth daily as needed for erectile dysfunction., Disp: 30 tablet, Rfl: 1  ???  SUBOXONE 8-2 mg sublingual film, Place 1 Film under the tongue Two (2) times a day. , Disp: , Rfl: 0  ???  tamsulosin (FLOMAX) 0.4 mg capsule, Take 1 capsule (0.4 mg total) by mouth two (2) times a day., Disp: 180 capsule, Rfl: 1    +++++++++++++++++++    OBJECTIVE  There were no vitals taken for this visit.     GENERAL: Patient appears stated age, in NAD  Tremor: no  EOM: Grossly intact.  Skin: normal, with normal hair distribution   NEUROLOGIC: grossly intact.   PSYCHIATRIC: mood and affect were appropriate    Pertinent labs:  Hospital Outpatient Visit on 08/27/2021   Component Date Value Ref Range Status   ??? Creatinine Whole Blood, POC 08/27/2021 0.9  0.8 - 1.4 mg/dL Final   ??? eGFR CKD-EPI (2021) Male 08/27/2021 >90  >=60 mL/min/1.19m2 Final    eGFR calculated with CKD-EPI 2021 equation in accordance with [employer] and [employer] of Nephrology Task Force recommendations.       Pertinent Radiology:  2020

## 2021-09-22 ENCOUNTER — Ambulatory Visit: Admit: 2021-09-22 | Payer: PRIVATE HEALTH INSURANCE | Attending: "Endocrinology | Primary: "Endocrinology

## 2021-10-06 ENCOUNTER — Ambulatory Visit
Admit: 2021-10-06 | Discharge: 2021-10-07 | Payer: PRIVATE HEALTH INSURANCE | Attending: Registered" | Primary: Registered"

## 2021-10-06 DIAGNOSIS — R636 Underweight: Principal | ICD-10-CM

## 2021-10-06 NOTE — Unmapped (Signed)
Enhanced Care Nutrition-Follow Up Assessment    Nutrition Assessment:  Patient comes today trying to increase his intake. We discussed keeping a diary so we have a better idea of what he is actually doing. Patient does seem to be intaking a large amount of sugar. We discussed trying to switch up to foods that have fat/protein as well.    Nutrition Diagnosis:  Inadequate Energy Intake as related to poor appetite as evidenced by weight loss    Patient Stated Health/Nutrition Goals:  Meet nutritional needs   Reduce long-term health risk    Identified treatment goals: Not applicable    Nutrition Interventions (ADVISE/ASSIST):  1. Continue goal of 300-400 calories six times a day  2. Keep a food diary for at least 3 days: write down everything you ate and how much  3. Try to change up snacks:   A. Graham crackers, peanut butter   B. Chocolate, Nuts (peanuts/cashew etc)   C. Peanut butter and jelly sandwich    ______________________________________________________________________  Referring MD or Clinic:   Kimel-Scott, Rosalio Macadamia*  [name], MD    Reason for Referral:   1. Underweight due to inadequate caloric intake        Medical History:  Past Medical History:   Diagnosis Date   ??? Actinic keratosis    ??? Asthma    ??? Basal cell carcinoma    ??? Chronic headaches    ??? Colon polyp    ??? Enlarged prostate    ??? GERD (gastroesophageal reflux disease) 02/08/2013   ??? Hepatitis C    ??? Hx of substance abuse (CMS-HCC)     hx of recreational drug use (cocaine IV, intranasal and crack),    ??? Infectious viral hepatitis    ??? Lung nodule seen on imaging study 07/12/2016   ??? Osteoporosis    ??? Other emphysema (CMS-HCC)    ??? Renal calculus    ??? Skin cancer    ??? Tobacco abuse 02/08/2013       Anthropometrics:    Present Weight 49.4 kg (108 lb 12.8 oz) Current BMI 17.57   Goal Weight   120# Current IBW 64.44 kg   Present Height 167.6 cm (5' 5.98)       Weight History:  Wt Readings from Last 6 Encounters:   10/06/21 49.4 kg (108 lb 12.8 oz)   09/01/21 48.1 kg (106 lb)   08/31/21 50.3 kg (111 lb)   08/18/21 51.4 kg (113 lb 6.4 oz)   07/26/21 51.8 kg (114 lb 3.2 oz)   06/23/21 54.3 kg (119 lb 12.8 oz)     Weight Change:  Patient lost 3# since last RD visit (had colonoscopy)    Medications, Herbs, Supplements:  Reviewed nutritionally relevant medications and supplements.    Recent Labs:   Hemoglobin A1C (%)   Date Value   02/08/2013 5.3      No components found for: LDLCALC   BP Readings from Last 3 Encounters:   09/01/21 110/70   08/18/21 97/50   07/26/21 129/55     Lab Results   Component Value Date    CHOL 138 08/16/2016    CHOL 113 02/08/2013     Lab Results   Component Value Date    HDL 41 08/16/2016    HDL 30 (L) 02/08/2013     Lab Results   Component Value Date    LDL 80 08/16/2016    LDL 70 02/08/2013     Lab Results  Component Value Date    VLDL 17.4 08/16/2016     Lab Results   Component Value Date    CHOLHDLRATIO 3.4 08/16/2016     Lab Results   Component Value Date    TRIG 87 08/16/2016    TRIG 65 02/08/2013       Physical Activity:  Daily activities    Dietary Restrictions, Food Intolerances:  None    Other GI Issues:  Frequent stool-taking lactulose    Allergies:   Allergies   Allergen Reactions   ??? Bicalutamide Rash   ??? Penicillins Rash   ??? Rocephin [Ceftriaxone] Rash     Patient received in Emergency Room and reported rash to stomach and back several hours later.        24-Hour recall/usual intake:  1st Strawberry [place], Coffee-sugar, Secretary/administrator  2nd Roast with potato/carrots, Pepsi  3rd BBQ chicken, rice, broccoli, Pepsi  Snacks Honey Bun, Pinwheel: 4-5, 4 Hershey chocolate bars    Other Usual Intake:  1st 10-11 AM: Cheese [place]  2nd 1-2 PM: Cheese dogs sometimes fries (Goes to [employer])  3rd 6-7 PM: Protein, starch, vegetables; Stews  Snacks chocolate, candy, graham crackers and peanut butter  Beverages: Pepsi, Protein Shakes x3 (Premier protein powder mixed in whole milk-2)  Eating out: Daily  Cooking: Wife  Grocery shopping: Wife  Grocery shops at: [employer], [employer] benefits: None  Working Emergency planning/management officer, Engineer, 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    Behavioral Risk Factors:  N/A  Fast Eating  N/A  Overeating  N/A    Hunger and Satiety Issues:  Appetite: Not good    Patient Questions:  None    Estimated Needs:  Estimated Energy Needs: 1800-2100 kcal    Estimated Protein Needs: 80-90 gm/day    Materials Provided To Meet their identified goals:  List of recommendations    Food labels discussed: Yes    Expected Compliance/Barriers are:   Comprehension of plan good  Readiness for change good  Ability to meet goals good    We assessed family/social/cultural characteristics and these were relevant to care: none evident    Patient has auditory or visual communication barrier or need: no    Interventions Codes:  General/healthful diet     Follow-up (ARRANGE):  4 weeks      Length of visit was 15 minutes. Patient understands diagnosis and treatment plan. Patient voices understanding. Plan is consistent with the patient???s preferences and goals.

## 2021-10-06 NOTE — Unmapped (Signed)
Patient Stated Health/Nutrition Goals:  Meet nutritional needs   Reduce long-term health risk    Identified treatment goals: Not applicable    Nutrition Interventions (ADVISE/ASSIST):  1. Continue goal of 300-400 calories six times a day  2. Keep a food diary for at least 3 days: write down everything you ate and how much  3. Try to change up snacks:   A. Graham crackers, peanut butter   B. Chocolate, Nuts (peanuts/cashew etc)   C. Peanut butter and jelly sandwich

## 2021-10-06 NOTE — Unmapped (Signed)
[facility] Refill Coordination Note    Specialty Medication(s) to be Shipped:   Infectious Disease: Xifaxan    Other medication(s) to be shipped: No additional medications requested for fill at this time     Harold Richards, DOB: 12/30/1955  Phone: [phone] (home)       All above HIPAA information was verified with patient.     Was a Nurse, learning disability used for this call? No    Completed refill call assessment today to schedule patient's medication shipment from the [facility] [phone]).  All relevant notes have been reviewed.     Specialty medication(s) and dose(s) confirmed: Regimen is correct and unchanged.   Changes to medications: [name] reports no changes at this time.  Changes to insurance: No  New side effects reported not previously addressed with a pharmacist or physician: None reported  Questions for the pharmacist: No    Confirmed patient received a Conservation officer, historic buildings and a Surveyor, mining with first shipment. The patient will receive a drug information handout for each medication shipped and additional FDA Medication Guides as required.       DISEASE/MEDICATION-SPECIFIC INFORMATION        N/A    SPECIALTY MEDICATION ADHERENCE     Medication Adherence    Patient reported X missed doses in the last month: 0  Specialty Medication: XIFAXAN  Patient is on additional specialty medications: No              Were doses missed due to medication being on hold? No    Xifaxan 550 mg: 8 days of medicine on hand        REFERRAL TO PHARMACIST     Referral to the pharmacist: Not needed      [facility]     Shipping address confirmed in Epic.     Delivery Scheduled: Yes, Expected medication delivery date: 10/12/21.     Medication will be delivered via Next Day Courier to the prescription address in Epic WAM.    Unk Lightning   [facility] Specialty Technician

## 2021-10-12 MED FILL — XIFAXAN 550 MG TABLET: ORAL | 30 days supply | Qty: 60 | Fill #5

## 2021-10-18 NOTE — Unmapped (Signed)
ASSESSMENT / PLAN    1. Osteoporosis of lumbar spine  He has done reslly well with 4 years of Reclast and has made target.  We will start today on BiP holiday.  I would like to see him in 2 years with repeat density - noting that with low testosterone (lupron), stable cirrhosis, kidney stones, etc - it is highly possible that he will lose bone and need another cycle of reclast.    2. Prostate cancer (CMS-HCC)  Note lupron    3. Cirrhosis (CMS-HCC)  apprarently stable    RTC 2 years with dxa    +++++++++++++++++++++++++++++++++++++++++++++    SUBJECTIVE    New dxa see below    HPI: Harold Richards is a 66 y.o. male with osteoporosis/ cirrhosis/ prostate (lupron) Ca last seen 10-2020    1. Osteoporosis of lumbar spine   - Multiple contributors potential for spinal osteoporosis - including cirrhosis (testosterone), kidney stones (PTH, renal leak), lupron, smokes 3-4 cigs/day     start holiday?    Treatment  Reclast - 2018, 2019, 10/2019, 11/2020  X 4    Bone History  Rib fracture x 2 in 2015 - fell off truck  Still working - does okay.  Is exhausted when he is not at work.  Sexual function is decreased - able to have erection. No change in shaving.  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  Proximal left femur  -1.8. Femoral neck density  -1.7. ??  2020   L1 to 4  -2.3.   Proximal left femur -1.9.  Femoral neck  -1.9.       New   09/09/2021  Spine -2.1  Left Total Hip -1.9     Femoral Neck -1.2                2. Gastroesophageal reflux disease    3. 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 Prostate cancer x2019   Dx: high??risk prostate cancer, cT1c, Gleason??4+4=8??in 2/12??cores, PSA 4.59. EBRT x 5 wks + Brachytherapy + 18 mon ADT.    Also Lupron q 3 months.  2023 still stable on 6 month f/u    Pertinent Medical History: Sialolithiasis of submandibular gland - excised 2016  Positive recreational drug use (cocaine IV, intranasal and crack) --clean x 10 years  Kidney stones -  Last was 2015 x 2 -  No family history. No scan  2017  ALK 50, calcium 9.6, MCV 106, Vitamin D 55    REVIEW OF SYSTEMS:negative except for as stated in HPI.  Note stones.  Hepatitis C - with some degree of compensated cirrhosis but patient does not have any symptoms.    Social History:smoker, married. Grown children 30s   Worked at the [employer] in [place]  Retired in October 2021 - he is quite happy with this.    Wife 2021 dx with breast cancer  Doing a little better.  looks after 2 horses  //  he has 3 grandchildren who he spends a lot of enjoyable time with.    Family history: parents - none with hip fracture - grandmother with hip fracture.  Mother lung cancer death, dad still living age 66  Brother deceased age 33, Sister at age 71 - both car accidents.  Current Outpatient Medications:   ???  albuterol HFA 90 mcg/actuation inhaler, Inhale 2 puffs every four (4) hours as needed for wheezing., Disp: 8 g, Rfl: 3  ???  aspirin (ECOTRIN) 81 MG tablet, Take 1 tablet (81 mg total) by mouth daily., Disp: , Rfl:   ???  atorvastatin (LIPITOR) 20 MG tablet, Take 1 tablet (20 mg total) by mouth nightly., Disp: 90 tablet, Rfl: 3  ???  cholecalciferol, vitamin D3-25 mcg, 1,000 unit,, 25 mcg (1,000 unit) capsule, Take 1 capsule (25 mcg total) by mouth daily., Disp: , Rfl:   ???  donepeziL (ARICEPT) 5 MG tablet, Take 2 tablets (10 mg total) by mouth in the morning., Disp: 90 tablet, Rfl: 3  ???  ferrous sulfate 325 mg (65 mg iron) CpER, Take 1 tablet by mouth daily. , Disp: , Rfl:   ???  fluticasone propionate (FLONASE) 50 mcg/actuation nasal spray, 1 spray into each nostril nightly., Disp: 16 g, Rfl: 0  ???  fluticasone-umeclidin-vilanter (TRELEGY ELLIPTA) 100-62.5-25 mcg inhaler, Inhale 1 puff daily., Disp: 60 each, Rfl: 3  ???  gabapentin (NEURONTIN) 100 MG capsule, Take 1 capsule (100 mg total) by mouth nightly., Disp: 90 capsule, Rfl: 3  ???  guaiFENesin (MUCINEX) 600 mg 12 hr tablet, Take 1 tablet (600 mg total) by mouth every morning., Disp: , Rfl:   ???  lactulose (CHRONULAC) 10 gram/15 mL solution, Take 15 mL (10 g total) by mouth Two (2) times a day., Disp: 900 mL, Rfl: 6  ???  loratadine (CLARITIN) 10 mg tablet, Take 1 tablet (10 mg total) by mouth daily., Disp: , Rfl:   ???  mirtazapine (REMERON) 15 MG tablet, Take 1 tablet (15 mg total) by mouth nightly., Disp: 90 tablet, Rfl: 3  ???  multivitamin (MULTIPLE VITAMIN ORAL), Take 1 tablet by mouth daily., Disp: , Rfl:   ???  oxybutynin (DITROPAN XL) 5 MG 24 hr tablet, Take 1 tablet (5 mg total) by mouth daily., Disp: 30 tablet, Rfl: 11  ???  rifAXIMin (XIFAXAN) 550 mg Tab, Take 1 tablet (550 mg total) by mouth Two (2) times a day., Disp: 60 tablet, Rfl: 5  ???  sildenafiL (VIAGRA) 50 MG tablet, Take 1 tablet (50 mg total) by mouth daily as needed for erectile dysfunction., Disp: 30 tablet, Rfl: 1  ???  SUBOXONE 8-2 mg sublingual film, Place 1 Film (8 mg of buprenorphine total) under the tongue Two (2) times a day., Disp: , Rfl: 0  ???  tamsulosin (FLOMAX) 0.4 mg capsule, Take 1 capsule (0.4 mg total) by mouth two (2) times a day., Disp: 180 capsule, Rfl: 1    +++++++++++++++++++    OBJECTIVE  BP 122/56  - Pulse 67  - Ht 154.9 cm (5' 0.98)  - Wt 51.5 kg (113 lb 9.6 oz)  - BMI 21.48 kg/m??      GENERAL: Patient appears stated age, in NAD  Tremor: no  EOM: Grossly intact.  Skin: normal, with normal hair distribution   NEUROLOGIC: grossly intact.   PSYCHIATRIC: mood and affect were appropriate    Pertinent labs:  No visits with results within 1 Month(s) from this visit.   Latest known visit with results is:   Hospital Outpatient Visit on 08/27/2021   Component Date Value Ref Range Status   ??? Creatinine Whole Blood, POC 08/27/2021 0.9  0.8 - 1.4 mg/dL Final   ??? eGFR CKD-EPI (2021) Male 08/27/2021 >90  >=60 mL/min/1.16m2 Final    eGFR calculated with CKD-EPI 2021 equation in  accordance with [employer] and [employer] of Nephrology Task Force recommendations.       Pertinent Radiology:  2020

## 2021-10-20 ENCOUNTER — Ambulatory Visit
Admit: 2021-10-20 | Discharge: 2021-10-20 | Payer: PRIVATE HEALTH INSURANCE | Attending: Internal Medicine | Primary: Internal Medicine

## 2021-10-20 ENCOUNTER — Ambulatory Visit
Admit: 2021-10-20 | Discharge: 2021-10-20 | Payer: PRIVATE HEALTH INSURANCE | Attending: "Endocrinology | Primary: "Endocrinology

## 2021-10-20 DIAGNOSIS — K7469 Other cirrhosis of liver: Principal | ICD-10-CM

## 2021-10-20 DIAGNOSIS — C61 Malignant neoplasm of prostate: Principal | ICD-10-CM

## 2021-10-20 DIAGNOSIS — R413 Other amnesia: Principal | ICD-10-CM

## 2021-10-20 DIAGNOSIS — H9190 Unspecified hearing loss, unspecified ear: Principal | ICD-10-CM

## 2021-10-20 DIAGNOSIS — F4321 Adjustment disorder with depressed mood: Principal | ICD-10-CM

## 2021-10-20 DIAGNOSIS — M810 Age-related osteoporosis without current pathological fracture: Principal | ICD-10-CM

## 2021-10-20 DIAGNOSIS — F5105 Insomnia due to other mental disorder: Principal | ICD-10-CM

## 2021-10-20 DIAGNOSIS — R4189 Other symptoms and signs involving cognitive functions and awareness: Principal | ICD-10-CM

## 2021-10-20 NOTE — Unmapped (Addendum)
It was great to see you today!    - We talked about setting a reminder in your phone to eat lunch every day. I also encourage you to check out Meals on Wheels: [email or web address]  - I would recommend that you stop the oxybutynin (if you are still taking it).   - Wear your CPAP as much as possible.     If you have any questions, call the [facility] at [phone] during regular hours, or send me a note through MyChart. Please allow 2 business days to respond to [employer]. For urgent concerns after hours and weekends, please call the [facility] nurse line at [phone].    Take care,    Barbie Haggis, MD

## 2021-10-20 NOTE — Unmapped (Addendum)
You have done really well on Reclast.  You start on holiday now, and will be on holiday for at least 2 years.    In 2 years - early 2025 - I need to see you. I will want to look at a 2 year density at that time. You can write me a MyChart message and I will order the density.    During the next 2 years - you have several medical issues that may enhance loss of bone.   I would recommend  1000 U vitD daily  Exercise program - this should include cardiometabolic (cycling, walking - get heart rate up) and resistance training - there are a lot of places on the internet to get an idea - you could start with [employer]    DXA  2016   L1 to 4T -2.6.  Proximal left femur  -1.8. Femoral neck density  -1.7.    2020   L1 to 4  -2.3  Proximal left femur -1.9.  Femoral neck  -1.9.     09/09/2021  Spine    -2.1  Left Total Hip -1.9            Femoral Neck -1.2

## 2021-10-20 NOTE — Unmapped (Signed)
[facility]:  MEMORY AND COGNITIVE FOLLOW UP    Date April 14, 2021  Patient Name: Harold Richards  [facility] MRN #:[id number]  PCP: Harold Richards  Requesting Physician: Harold Oxford, MD  [address]  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 At our initial visit, he scored a 20 out of 30 on the [facility], and he was only able to name 32 out of 60 on the [employer]. His history was notable for poor sleep. His mirtazapine was increased to 15 mg, gabapentin 100 mg was started for RLS, and he was started on donepezil 5 mg. He was also counseled on sleep hygiene and encouraged to use his CPAP. 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 There was quite widespread impairment but with significant variability, so there was some concern for a possible encephalopathy.  At a follow-up visit, his donepezil was increased to 10 mg.    Based on the neuropsychological testing and other findings thus far, the picture remains a little unclear still. As mentioned by Dr. Tiburcio Richards, this would be an atypical Alzheimer's presentation. Though his testing shows quite severe impairments, he otherwise continues to function well (he was even able to tell me the specific dates of his upcoming appointments today), and his symptoms have been stable over the past year. It is possible that his cognitive impairment is secondary to his cirrhosis with history of hepatitis C (now s/p treatment but was chronic), although he has no signs of hepatic encephalopathy. He is also on suboxone chronically, so this may also slow his cognition and processing speed. Despite this being very unusual for an Alzheimer's presentation, he has possibly had some benefit on the donepezil and does not seem to have had any side effects, so I did recommend continuing for now, but this should be readdressed at subsequent visits.  I do not think his weight loss is related to this but rather because of sometimes forgetting to eat.  We discussed today setting a reminder at lunchtime to eat lunch, and I also provided him resources on Meals on Wheels for his county.  It is possible that he needs a new CPAP given his weight loss, so he will follow-up next time with Dr. Lucina Richards.    Plan  Diagnostic testing   - serum laboratories - has regular follow up monitoring of cirrhosis, recent cognitive tests all normal   - neuropsychology - completed July 2022 , see below. [ ]  Could consider repeating after 1 to 2 years   - Imaging - completed  Work on sleep  -  much improved with the increase in mirtazapine and improvement of RLS with gabapentin  - Encouraged use of CPAP -but may need a new fitting  Work on mood  - No issues  Work on Pharmacy  - On suboxone for chronic pain and OUD  -Recommended stopping oxybutynin  Work on Vascular risk factors  - Blood pressure always at goal  Memory focussed medication  -  Continue donepezil 10 mg -can continue to address at future visits   Hearing/Vision  - Audiology referral placed at last visit, needs follow-up     Reviewed the differential diagnosis, plan of care in detail, as well as other elements as detailed above.  A written summary was provided to the patient, including a current medication and allergy list, problem list and plan of care. All the patient's questions and concerns were addressed to their stated satisfaction .      Return Visit Discussed: Return in about 6 months (around 04/22/2022).    HEALTH EDUCATION/HEALTH LITERACY: PATIENT EDUCATION was provided. Reviewed the differential diagnosis, plan of care in detail, as well as other elements as detailed above.      HPI and Data Summary:     History:  He is here by himself today.  His wife Harold Richards is out getting food.    Overall things are going okay.  He still feels like he is forgetful but no significant change there.  Harold Richards continues to set up his medications in a pillbox, but he is able to reliably take them each day.  He currently spends a lot of his time watching his 32-year-old granddaughter who lives down the street from them.  He does also wonder if he sleeps too much because he gets in bed at around 8:00 and gets up at 5 or 6.  He does wake up a couple of times per night to urinate.  He also still occasionally falls asleep during the day.  He has been wearing his CPAP but only tolerates it for about 4 to 5 hours per night.    In terms of the weight loss, it has has stabilized, though he still had significant weight loss over the past year.  His daughter uses a prepackaged meal service that she is going to arrange for him as well, but he often does not remember to eat lunch.    He has no anxiety or depression.  He and his wife think that the donepezil has helped a little bit.\    Cognitive History/ Data Review  ??  Symptom Onset :  ?? 2021   First Symptom:  ?? Short term memory loss   Highest Education:  ?? 10th grade   Occupation  ?? 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 No   Alcohol Use  ?? none   Anosognosia  No   ?? ??     Neuroimaging  MRI brain 11/03/20:  Mild small vessel changes  Possible mild atrophy in parietal temporal area    Neuropsychological Evaluation July 2022  Summary of Findings:  [name] presentation is quite unusual. Validity indicators were mixed but somewhat borderline, though this would be realistic for someone with genuine, very severe impairment. The rest of his performance is consistent with severe impairment, but he is reportedly still functioning adequately in independent living skills, which seems surprising given his scores. On testing, his performance was moderately to severely impaired overall, but with a very high level of variability. He scored intact on tests of orientation, spatial perception and confrontation naming (which was also surprising since he was severely impaired on that with Dr. Knox Saliva). He was more consistently impaired on tests of memory (with an especially unusual performance on a list-learning task, with a negative learning curve), executive functions, attention, and processing speed.   ??  At this point, 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 It is possible that extremely poor sleep along with pain and various medical issues are together causing an encephalopathy and result in the high level of variability we see here. It does not fit typical Alzheimer's presentation, but he could theoretically have a very atypical version of Alzheimer's (with young age of onset, more frontal presentation, and more rapid progression than usual). Other causes of severe, relatively rapid (going from unimpaired to severely impaired over the course of a year) impairment need to be tested for, so I strongly recommend any additional workup to characterize this as the top priority. First, although his MRI reading only mentioned a lack of atrophy or white matter disease, it looked unusual to me, with various small dark spots not typically seen on brain scans, that were not mentioned in the reading. This is outside my expertise, so I recommend Dr. Knox Saliva take another look at his MRI or possibly consult another neurologist to look into it as well. They should also consider whether an LP, different type of imaging (e.g. PET, MRI with another type of contrast) or other tests (rule out paraneoplastic syndrome, infection, etc) would be helpful.    TEST SCORES: ?? ?? ??   Note: This summary of test scores accompanies the interpretive report and should not be considered in isolation without reference to the appropriate sections in the text. Descriptors are based on appropriate normative data and may be adjusted based on clinical judgment. The terms ???impaired??? and ???within normal limits (WNL)??? are used when a more specific level of functioning cannot be determined.   ?? ?? ?? ??   Validity Testing: ?? ?? DESCRIPTOR   ?? ?? ?? ??   Medical Symptom Validity Test (MSVT) --- --- Genuine Severe Memory Impairment Profile   WAIS-IV Reliable Digit Span: --- --- Below Expectation   HVLT-R Recognition Discrimination Index: --- --- Below Expectation   ?? ?? ?? ??   Overall Cognitive Status: ?? ?? ??   ?? ?? ?? ??   ?? Raw Score ?? ??   MoCA Orientation 5 /6 Intact   Current and Past Two [place] Presidents 3 /3 Intact   ?? ?? ?? ??   Memory: ?? ?? ??   ?? ?? ?? ??   Hopkins Verbal Learning Test (HVLT-R), Form 4: Raw Score (T Score) Percentile ??        Trial 1 5 ?? ??        Trial 2 3 ?? ??        Trial 3 3 ?? ??   Total Trials 1-3 11/36 (11) <1 Exceptionally Low   Delayed Recall 2/12 (9) <1 Exceptionally Low   Recognition Discrimination Index 1 (-28) <1 Exceptionally Low   True Positives 6 --- ---   False Positives 5 --- ---   ?? ?? ?? ??   [place] 21 Raw Score Percentile ??   Immediate (verbatim) 9 4 Well Below Average   Immediate (paraphrase) 6 1 Exceptionally Low   Delayed (verbatim) 6 4 Well Below Average   Delayed (paraphrase) 5 2 Exceptionally Low   ?? ?? ?? ??   Benson Figure Raw Score Percentile ??   Delayed Reproduction 7 7 Well Below Average   Delayed Recognition 0 --- Impaired   ?? ?? ?? ??   Attention/Executive Function: ?? ?? ??   ?? ?? ?? ??   Trail Making Test Raw  Score (T Score) Percentile ??   Part A 132 secs., 0 errors (18) <1 Exceptionally Low   Part B discontinued - unable to complete <1 Exceptionally Low   ?? ?? ?? ??   WAIS-IV Digit Span: Scaled Score Percentile ??   Forward 3,  longest span (4) 1 Exceptionally Low   Backward 6,  longest span (3) 9 Below Average   ?? ?? ?? ??   Language: ?? ?? ??   ?? ?? ?? ??   Verbal Fluency Tasks Raw Score Percentile ??   COWA (FAS Letters) 15 3 Well Below Average        F 4 ?? ??        A 5 ?? ??        S 6 ?? ??   Animal Naming 11 5 Well Below Average   ?? ?? ?? ??   ?? Raw Score ?? ??   CERAD Naming 13 /15 Average   ?? ?? ?? ??   Visuospatial/Visuoconstruction: ?? ?? ??   ?? ?? ?? ??   ?? Raw Score Percentile ??   MMSE Pentagons Copy 1 /1 Intact   Benson Figure Copy 12/17 --- Exceptionally Low   RBANS Line Orienation 16 26-50 Average   Visual Agnosia Items 7 /7 Intact     Other Neuro Studies  Pending    Cognitive Lab Tests:  Lab Results   Component Value Date    TSH 0.953 10/13/2020      Lab Results   Component Value Date    VITAMINB12 1,281 (H) 10/13/2020        Chemistry        Component Value Date/Time    NA 142 07/26/2021 1016    NA 141 09/25/2014 0955    K 4.8 07/26/2021 1016    K 4.0 09/25/2014 0955    CL 110 (H) 07/26/2021 1016    CL 104 09/25/2014 0955    CO2 24.7 07/26/2021 1016    CO2 28 09/25/2014 0955    BUN 26 (H) 07/26/2021 1016    BUN 13 09/25/2014 0955    CREATININE 0.9 08/27/2021 1400    GLU 96 07/26/2021 1016        Component Value Date/Time    CALCIUM 9.6 07/26/2021 1016    CALCIUM 9.1 09/25/2014 0955    ALKPHOS 36 (L) 07/26/2021 1016    ALKPHOS 55 09/25/2014 0955    AST 22 07/26/2021 1016    AST 25 09/25/2014 0955    ALT 17 07/26/2021 1016    ALT 22 09/25/2014 0955    BILITOT 0.2 (L) 07/26/2021 1016    BILITOT 0.6 09/25/2014 0955       No results found for: LABRPR   No results found for: HIV12AB      Cognitive Testing:  See above for neuropsych testing                 Past Medical History:   Diagnosis Date   ??? Actinic keratosis    ??? Asthma    ??? Basal cell carcinoma    ??? Chronic headaches    ??? Colon polyp    ??? Enlarged prostate    ??? GERD (gastroesophageal reflux disease) 02/08/2013   ??? Hepatitis C    ??? Hx of substance abuse (CMS-HCC)     hx of recreational drug use (cocaine IV, intranasal and crack),    ??? Infectious viral hepatitis    ??? Lung nodule seen on imaging study  07/12/2016   ??? Osteoporosis    ??? Other emphysema (CMS-HCC)    ??? Renal calculus    ??? Skin cancer    ??? Tobacco abuse 02/08/2013       Medications  Outpatient Encounter Medications as of 10/20/2021   Medication Sig Dispense Refill   ??? albuterol HFA 90 mcg/actuation inhaler Inhale 2 puffs every four (4) hours as needed for wheezing. 8 g 3   ??? aspirin (ECOTRIN) 81 MG tablet Take 1 tablet (81 mg total) by mouth daily.     ??? atorvastatin (LIPITOR) 20 MG tablet Take 1 tablet (20 mg total) by mouth nightly. 90 tablet 3   ??? cholecalciferol, vitamin D3-25 mcg, 1,000 unit,, 25 mcg (1,000 unit) capsule Take 1 capsule (25 mcg total) by mouth daily.     ??? donepeziL (ARICEPT) 5 MG tablet Take 2 tablets (10 mg total) by mouth in the morning. 90 tablet 3   ??? ferrous sulfate 325 mg (65 mg iron) CpER Take 1 tablet by mouth daily.      ??? fluticasone propionate (FLONASE) 50 mcg/actuation nasal spray 1 spray into each nostril nightly. 16 g 0   ??? fluticasone-umeclidin-vilanter (TRELEGY ELLIPTA) 100-62.5-25 mcg inhaler Inhale 1 puff daily. 60 each 3   ??? gabapentin (NEURONTIN) 100 MG capsule Take 1 capsule (100 mg total) by mouth nightly. 90 capsule 3   ??? guaiFENesin (MUCINEX) 600 mg 12 hr tablet Take 1 tablet (600 mg total) by mouth every morning.     ??? lactulose (CHRONULAC) 10 gram/15 mL solution Take 15 mL (10 g total) by mouth Two (2) times a day. 900 mL 6   ??? loratadine (CLARITIN) 10 mg tablet Take 1 tablet (10 mg total) by mouth daily.     ??? mirtazapine (REMERON) 15 MG tablet Take 1 tablet (15 mg total) by mouth nightly. 90 tablet 3   ??? multivitamin (MULTIPLE VITAMIN ORAL) Take 1 tablet by mouth daily.     ??? oxybutynin (DITROPAN XL) 5 MG 24 hr tablet Take 1 tablet (5 mg total) by mouth daily. 30 tablet 11   ??? rifAXIMin (XIFAXAN) 550 mg Tab Take 1 tablet (550 mg total) by mouth Two (2) times a day. 60 tablet 5   ??? sildenafiL (VIAGRA) 50 MG tablet Take 1 tablet (50 mg total) by mouth daily as needed for erectile dysfunction. 30 tablet 1   ??? SUBOXONE 8-2 mg sublingual film Place 1 Film (8 mg of buprenorphine total) under the tongue Two (2) times a day.  0   ??? tamsulosin (FLOMAX) 0.4 mg capsule Take 1 capsule (0.4 mg total) by mouth two (2) times a day. 180 capsule 1     No facility-administered encounter medications on file as of 10/20/2021.       Allergies  Allergies   Allergen Reactions   ??? Bicalutamide Rash   ??? Penicillins Rash   ??? Rocephin [Ceftriaxone] Rash     Patient received in Emergency Room and reported rash to stomach and back several hours later.        Family History   Family History   Problem Relation Age of Onset   ??? Heart attack Father    ??? Hypertension Father    ??? Lung cancer Mother    ??? Bone cancer Mother    ??? Hypertension Mother    ??? Hepatitis Sister    ??? Melanoma Neg Hx        Social History   Social History  Social History Narrative   ??? Not on file       Exam  General Appearance: attentive, hard of hearing  Vital Signs: BP 111/50 (BP Site: R Arm, BP Position: Sitting, BP Cuff Size: Medium)  - Pulse 73  - Resp 17  - Ht 154.9 cm (5' 0.98)  - Wt 49.1 kg (108 lb 5 oz)  - BMI 20.48 kg/m??   HEENT: Sclera were anicteric and non-injected.   Chest: Clear to auscultation without wheezes, crackles, or rales.  Cor: Regular rate and rhythm without murmurs, gallops, or rubs.  Ext: No cyanosis, clubbing or edema.  Neurological Exam:  No asterixis  No tremor  No dysmetria with finger to nose testing  Strength 5/5 B/L  Cranial Nerves: Pupils equal round and reactive to light, extraocular movments full without saccadic intrusions.    Gait: normal    Initial exam May 2022:  HEENT: Sclera were anicteric and non-injected. Oropharynx examination revealed a Mallampati Grade II (hard and soft palate, upper portion of tonsils anduvula visible) airway.  Oropharynx findings included:some missing teeth.  Neck was supple.   Lymph: No cervical or supraclavicular lymphadenopathy.   Chest: Clear to auscultation without wheezes, crackles, or rales.  Cor: Regular rate and rhythm without murmurs, gallops, or rubs.  Ext: No cyanosis, clubbing or edema.  Neurological Exam:  Cranial Nerves: Pupils equal round and reactive to light, extraocular movments full without saccadic intrusions.  Visual fields full to confrontation.  Facial movement in the upper and lower face symmetric.  Facial sensation symmetric to light touch and temp.  Hearing intact to finger rub. Tongue and palate midline.  Sternocleidomastoid and Trapeizius strength full.  Motor: No pronator drift.  Fine finger movements rapid bilaterally.  Power exam 5/5 strength in upper and lower extremities.   No tremor, no asterixis   Sensory: Intact to light touch and vibratory stimulation in the distal extremities.  Coordination: No dysmetria on finger to nose testing though hard for him to follow directions.  No apraxia  Gait: normal    Labs  Basic Metabolic Profile   Lab Results   Component Value Date    BUN 26 (H) 07/26/2021    BUN 13 09/25/2014    CO2 24.7 07/26/2021    CO2 28 09/25/2014           CBC   Lab Results   Component Value Date    WBC 5.0 07/26/2021    WBC 6.6 09/25/2014    RBC 3.71 (L) 07/26/2021    RBC 4.25 (L) 09/25/2014    MCV 106.4 (H) 07/26/2021    MCV 100 09/25/2014    MCH 36.2 (H) 07/26/2021    MCH 34 09/25/2014    MCHC 34.0 07/26/2021    MCHC 34 09/25/2014    RDW 12.6 07/26/2021    RDW 12.8 09/25/2014           Hepatic Function   Lab Results   Component Value Date    AST 22 07/26/2021    AST 25 09/25/2014    ALT 17 07/26/2021    ALT 22 09/25/2014           Comprehensive Metabolic Profile   Lab Results   Component Value Date    BUN 26 (H) 07/26/2021    BUN 13 09/25/2014    CO2 24.7 07/26/2021    CO2 28 09/25/2014    Anion Gap 7 07/26/2021    Anion Gap 9 09/25/2014    AST 22 07/26/2021  AST 25 09/25/2014    ALT 17 07/26/2021    ALT 22 09/25/2014        Vitamin B12   Lab Results   Component Value Date    Vitamin B-12 1,281 (H) 10/13/2020    Vitamin B-12 >1,000 (H) 10/10/2019    Vitamin B-12 >1,000 (H) 02/25/2019    Vitamin B-12 >1,000 (H) 08/07/2018    Vitamin B-12 >1,000 (H) 11/20/2017        TSH   Lab Results   Component Value Date    TSH 0.953 10/13/2020    TSH 0.83 02/08/2013        RPR   Lab Results   Component Value Date    RPR Nonreactive 10/13/2020        Folate Folate RBC   Lab Results   Component Value Date    Folate >24.0 10/13/2020     Lab Results   Component Value Date    Folate >24.0 10/13/2020        I personally spent 35 minutes face-to-face and non-face-to-face in the care of this patient, which includes all pre, intra, and post visit time on the date of service.     Author:  Osborn Coho 10/20/2021 8:44 AM    *Any errors in syntax or even information may not have been identified and edited on initial review prior to signing this note.

## 2021-10-24 ENCOUNTER — Ambulatory Visit: Admit: 2021-10-24 | Discharge: 2021-10-24 | Payer: PRIVATE HEALTH INSURANCE

## 2021-10-24 MED ADMIN — gadobenate dimeglumine (MULTIHANCE) 529 mg/mL (0.1mmol/0.2mL) solution 8 mL: 8 mL | INTRAVENOUS | @ 13:00:00 | Stop: 2021-10-24

## 2021-11-05 ENCOUNTER — Ambulatory Visit
Admit: 2021-11-05 | Discharge: 2021-11-05 | Payer: PRIVATE HEALTH INSURANCE | Attending: Registered" | Primary: Registered"

## 2021-11-05 ENCOUNTER — Ambulatory Visit: Admit: 2021-11-05 | Discharge: 2021-11-05 | Payer: PRIVATE HEALTH INSURANCE

## 2021-11-05 DIAGNOSIS — E611 Iron deficiency: Principal | ICD-10-CM

## 2021-11-05 DIAGNOSIS — C61 Malignant neoplasm of prostate: Principal | ICD-10-CM

## 2021-11-05 DIAGNOSIS — G9340 Encephalopathy, unspecified: Principal | ICD-10-CM

## 2021-11-05 DIAGNOSIS — R809 Proteinuria, unspecified: Principal | ICD-10-CM

## 2021-11-05 DIAGNOSIS — R634 Abnormal weight loss: Principal | ICD-10-CM

## 2021-11-05 DIAGNOSIS — F1111 Opioid abuse, in remission: Principal | ICD-10-CM

## 2021-11-05 LAB — CBC W/ AUTO DIFF
BASOPHILS ABSOLUTE COUNT: 0.1 10*9/L (ref 0.0–0.1)
BASOPHILS RELATIVE PERCENT: 1 %
EOSINOPHILS ABSOLUTE COUNT: 0.1 10*9/L (ref 0.0–0.5)
EOSINOPHILS RELATIVE PERCENT: 1.5 %
HEMATOCRIT: 38 % — ABNORMAL LOW (ref 39.0–48.0)
HEMOGLOBIN: 12.6 g/dL — ABNORMAL LOW (ref 12.9–16.5)
LYMPHOCYTES ABSOLUTE COUNT: 1 10*9/L — ABNORMAL LOW (ref 1.1–3.6)
LYMPHOCYTES RELATIVE PERCENT: 16.4 %
MEAN CORPUSCULAR HEMOGLOBIN CONC: 33.2 g/dL (ref 32.0–36.0)
MEAN CORPUSCULAR HEMOGLOBIN: 35.2 pg — ABNORMAL HIGH (ref 25.9–32.4)
MEAN CORPUSCULAR VOLUME: 105.9 fL — ABNORMAL HIGH (ref 77.6–95.7)
MEAN PLATELET VOLUME: 6.4 fL — ABNORMAL LOW (ref 6.8–10.7)
MONOCYTES ABSOLUTE COUNT: 0.3 10*9/L (ref 0.3–0.8)
MONOCYTES RELATIVE PERCENT: 4.4 %
NEUTROPHILS ABSOLUTE COUNT: 4.9 10*9/L (ref 1.8–7.8)
NEUTROPHILS RELATIVE PERCENT: 76.7 %
PLATELET COUNT: 210 10*9/L (ref 150–450)
RED BLOOD CELL COUNT: 3.59 10*12/L — ABNORMAL LOW (ref 4.26–5.60)
RED CELL DISTRIBUTION WIDTH: 12.3 % (ref 12.2–15.2)
WBC ADJUSTED: 6.4 10*9/L (ref 3.6–11.2)

## 2021-11-05 LAB — IRON PANEL
IRON SATURATION: 35 % (ref 20–55)
IRON: 106 ug/dL
TOTAL IRON BINDING CAPACITY: 307 ug/dL (ref 250–425)

## 2021-11-05 LAB — COMPREHENSIVE METABOLIC PANEL
ALBUMIN: 4 g/dL (ref 3.4–5.0)
ALKALINE PHOSPHATASE: 40 U/L — ABNORMAL LOW (ref 46–116)
ALT (SGPT): 19 U/L (ref 10–49)
ANION GAP: 7 mmol/L (ref 5–14)
AST (SGOT): 33 U/L (ref <=34)
BILIRUBIN TOTAL: 0.3 mg/dL (ref 0.3–1.2)
BLOOD UREA NITROGEN: 14 mg/dL (ref 9–23)
BUN / CREAT RATIO: 14
CALCIUM: 8.9 mg/dL (ref 8.7–10.4)
CHLORIDE: 112 mmol/L — ABNORMAL HIGH (ref 98–107)
CO2: 23.7 mmol/L (ref 20.0–31.0)
CREATININE: 1 mg/dL
EGFR CKD-EPI (2021) MALE: 85 mL/min/{1.73_m2} (ref >=60)
GLUCOSE RANDOM: 104 mg/dL (ref 70–179)
POTASSIUM: 4 mmol/L (ref 3.4–4.8)
PROTEIN TOTAL: 7 g/dL (ref 5.7–8.2)
SODIUM: 143 mmol/L (ref 135–145)

## 2021-11-05 LAB — URINALYSIS WITH MICROSCOPY WITH CULTURE REFLEX
BLOOD UA: NEGATIVE
GLUCOSE UA: NEGATIVE
HYALINE CASTS: 3 /LPF — ABNORMAL HIGH (ref <=0)
LEUKOCYTE ESTERASE UA: NEGATIVE
NITRITE UA: NEGATIVE
PH UA: 5 (ref 5.0–9.0)
RBC UA: 4 /HPF — ABNORMAL HIGH (ref 0–3)
SPECIFIC GRAVITY UA: >=1.03 (ref 1.005–1.030)
SQUAMOUS EPITHELIAL: 1 /HPF (ref 0–5)
UROBILINOGEN UA: 0.2
WBC UA: 2 /HPF (ref 0–3)

## 2021-11-05 LAB — VITAMIN B12: VITAMIN B-12: 647 pg/mL (ref 211–911)

## 2021-11-05 LAB — TOXICOLOGY SCREEN, URINE
AMPHETAMINE SCREEN URINE: NEGATIVE
BARBITURATE SCREEN URINE: NEGATIVE
BENZODIAZEPINE SCREEN, URINE: NEGATIVE
BUPRENORPHINE, URINE SCREEN: POSITIVE — AB
CANNABINOID SCREEN URINE: NEGATIVE
COCAINE(METAB.)SCREEN, URINE: NEGATIVE
FENTANYL SCREEN, URINE: NEGATIVE
METHADONE SCREEN, URINE: NEGATIVE
OPIATE SCREEN URINE: NEGATIVE
OXYCODONE SCREEN URINE: NEGATIVE

## 2021-11-05 LAB — FERRITIN: FERRITIN: 14.5 ng/mL

## 2021-11-05 LAB — PSA: PROSTATE SPECIFIC ANTIGEN: 0.06 ng/mL (ref 0.00–4.00)

## 2021-11-05 LAB — TSH: THYROID STIMULATING HORMONE: 0.584 u[IU]/mL (ref 0.550–4.780)

## 2021-11-05 MED ORDER — XIFAXAN 550 MG TABLET
ORAL_TABLET | Freq: Two times a day (BID) | ORAL | 5 refills | 30 days
Start: 2021-11-05 — End: ?

## 2021-11-05 NOTE — Unmapped (Signed)
Patient Stated Health/Nutrition Goals:  Meet nutritional needs   Reduce long-term health risk    Identified treatment goals: Not applicable    Nutrition Interventions (ADVISE/ASSIST):  1. Continue goal of 300-400 calories six times a day  2. Keep a food diary for at least 3 days: write down everything you ate and how much  3. Try to change up snacks:   A. Graham crackers, peanut butter   B. Chocolate, Nuts (peanuts/cashew etc)   C. Peanut butter and jelly sandwich

## 2021-11-05 NOTE — Unmapped (Signed)
[facility] Refill Coordination Note    Specialty Medication(s) to be Shipped:   Infectious Disease: Xifaxan    Other medication(s) to be shipped: No additional medications requested for fill at this time     Harold Richards, DOB: 29-Jun-1956  Phone: [phone] (home)       All above HIPAA information was verified with patient.     Was a Nurse, learning disability used for this call? No    Completed refill call assessment today to schedule patient's medication shipment from the [facility] [phone]).  All relevant notes have been reviewed.     Specialty medication(s) and dose(s) confirmed: Regimen is correct and unchanged.   Changes to medications: Harold Richards reports no changes at this time.  Changes to insurance: No  New side effects reported not previously addressed with a pharmacist or physician: None reported  Questions for the pharmacist: No    Confirmed patient received a Conservation officer, historic buildings and a Surveyor, mining with first shipment. The patient will receive a drug information handout for each medication shipped and additional FDA Medication Guides as required.       DISEASE/MEDICATION-SPECIFIC INFORMATION        N/A    SPECIALTY MEDICATION ADHERENCE     Medication Adherence    Patient reported X missed doses in the last month: 0  Specialty Medication: xifaxan 550mg   Patient is on additional specialty medications: No  Any gaps in refill history greater than 2 weeks in the last 3 months: no  Demonstrates understanding of importance of adherence: yes  Informant: patient  Reliability of informant: reliable  Confirmed plan for next specialty medication refill: delivery by pharmacy  Refills needed for supportive medications: not needed              Were doses missed due to medication being on hold? No    Xifaxan 550 mg: 8 days of medicine on hand        REFERRAL TO PHARMACIST     Referral to the pharmacist: Not needed      [facility]     Shipping address confirmed in Epic.     Delivery Scheduled: Yes, Expected medication delivery date: 11/11/2021.  However, Rx request for refills was sent to the provider as there are none remaining.     Medication will be delivered via Next Day Courier to the prescription address in Epic WAM.    Harold Richards   [facility] Specialty Technician

## 2021-11-05 NOTE — Unmapped (Signed)
Enhanced Care Nutrition-Follow Up Assessment    Nutrition Assessment:  Patient weight seems to have now stabilized. He is consuming over 35 kcal/kg a day so he should be meeting his needs.We discussed again trying to write down what he's eating which he did but forgot to bring.    Nutrition Diagnosis:  Inadequate Energy Intake as related to poor appetite as evidenced by weight loss    Patient Stated Health/Nutrition Goals:  Meet nutritional needs   Reduce long-term health risk    Identified treatment goals: Not applicable    Nutrition Interventions (ADVISE/ASSIST):  1. Continue goal of 300-400 calories six times a day  2. Keep a food diary for at least 3 days: write down everything you ate and how much  3. Try to change up snacks:   A. Graham crackers, peanut butter   B. Chocolate, Nuts (peanuts/cashew etc)   C. Peanut butter and jelly sandwich    ______________________________________________________________________  Referring MD or Clinic:   Kimel-Scott, Rosalio Macadamia*  [name], MD    Reason for Referral:   1. Unintentional weight loss        Medical History:  Past Medical History:   Diagnosis Date   ??? Actinic keratosis    ??? Asthma    ??? Basal cell carcinoma    ??? Chronic headaches    ??? Colon polyp    ??? Enlarged prostate    ??? GERD (gastroesophageal reflux disease) 02/08/2013   ??? Hepatitis C    ??? Hx of substance abuse (CMS-HCC)     hx of recreational drug use (cocaine IV, intranasal and crack),    ??? Infectious viral hepatitis    ??? Lung nodule seen on imaging study 07/12/2016   ??? Osteoporosis    ??? Other emphysema (CMS-HCC)    ??? Renal calculus    ??? Skin cancer    ??? Tobacco abuse 02/08/2013       Anthropometrics:    Present Weight 49 kg (108 lb 0.4 oz) Current BMI 17.44   Goal Weight   120# Current IBW 64.44 kg   Present Height 167.6 cm (5' 5.98)       Weight History:  Wt Readings from Last 6 Encounters:   11/05/21 49 kg (108 lb 0.4 oz)   11/05/21 49 kg (108 lb)   10/20/21 51.5 kg (113 lb 9.6 oz)   10/20/21 49.1 kg (108 lb 5 oz)   10/06/21 49.4 kg (108 lb 12.8 oz)   09/01/21 48.1 kg (106 lb)     Weight Change:  Patient weight remained the same since last RD visit     Medications, Herbs, Supplements:  Reviewed nutritionally relevant medications and supplements.    Recent Labs:   Hemoglobin A1C (%)   Date Value   02/08/2013 5.3      No components found for: LDLCALC   BP Readings from Last 3 Encounters:   11/05/21 126/59   10/20/21 122/56   10/20/21 111/50     Lab Results   Component Value Date    CHOL 138 08/16/2016    CHOL 113 02/08/2013     Lab Results   Component Value Date    HDL 41 08/16/2016    HDL 30 (L) 02/08/2013     Lab Results   Component Value Date    LDL 80 08/16/2016    LDL 70 02/08/2013     Lab Results   Component Value Date    VLDL 17.4 08/16/2016  Lab Results   Component Value Date    CHOLHDLRATIO 3.4 08/16/2016     Lab Results   Component Value Date    TRIG 87 08/16/2016    TRIG 65 02/08/2013       Physical Activity:  Daily activities    Dietary Restrictions, Food Intolerances:  None    Other GI Issues:  Frequent stool-taking lactulose    Allergies:   Allergies   Allergen Reactions   ??? Bicalutamide Rash   ??? Penicillins Rash   ??? Rocephin [Ceftriaxone] Rash     Patient received in Emergency Room and reported rash to stomach and back several hours later.        24-Hour recall/usual intake:  1st Raisin Bran-with added sugar (approx 380 kcal), Pecan twirls (approx 250), Milk (with protein powder)  2nd [place]: Sandwich with cheese, fries, Coke  3rd Spaghetti with meat sauce, salad, bread, Pepsi  Snacks 2-3 Honey Buns, Reese's, Protein Shake 2    Other Usual Intake:  1st 10-11 AM: Cheese [place]  2nd 1-2 PM: Cheese dogs sometimes fries (Goes to [employer])  3rd 6-7 PM: Protein, starch, vegetables; Stews  Snacks chocolate, candy, graham crackers and peanut butter  Beverages: Pepsi, Protein Shakes x3 (Premier protein powder mixed in whole milk-3 for a total of 1350 kcal, 132 gms of protein)  Eating out: Daily  Cooking: Wife  Grocery shopping: Wife  Grocery shops at: [employer], [employer] benefits: None  Working Emergency planning/management officer, Engineer, 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    Behavioral Risk Factors:  N/A  Fast Eating  N/A  Overeating  N/A    Hunger and Satiety Issues:  Appetite: Improving some    Patient Questions:  None    Estimated Needs:  Estimated Energy Needs: 1800-2100 kcal    Estimated Protein Needs: 80-90 gm/day    Materials Provided To Meet their identified goals:  List of recommendations    Food labels discussed: Yes    Expected Compliance/Barriers are:   Comprehension of plan good  Readiness for change good  Ability to meet goals good    We assessed family/social/cultural characteristics and these were relevant to care: none evident    Patient has auditory or visual communication barrier or need: no    Interventions Codes:  General/healthful diet     Follow-up (ARRANGE):  4 weeks      Length of visit was 15 minutes. Patient understands diagnosis and treatment plan. Patient voices understanding. Plan is consistent with the patient???s preferences and goals.

## 2021-11-05 NOTE — Unmapped (Signed)
[facility] at [facility]     Type of visit: face to face    Are you located in [place]? (for virtual visits only) N/A    Reason for visit: Follow up        Screening BP- 126/59 69    HCDM reviewed and updated in Epic:    We are working to make sure all of our patients??? wishes are updated in Epic and part of that is documenting a Environmental health practitioner for each patient  A Health Care Decision Maker is someone you choose who can make health care decisions for you if you are not able - who would you most want to do this for you????  is already up to date.    HCDM (patient stated preference): Pamala Duffel - Spouse - [phone]    HCDM, First Alternate: Gross,Christina - Daughter - [phone]    HCDM, Second Alternate: Joaquin Bend - Daughter - [phone]    BPAs completed:  PHQ9  COPD - MMRC symptom assessment    COVID-19 Vaccine Summary  Which COVID-19 Vaccine was administered  Moderna  Type:  Dates Given:  12/01/2020                   Immunization History   Administered Date(s) Administered    COVID-19 VACCINE,MRNA(MODERNA)(PF) 10/07/2019, 11/05/2019, 06/21/2020, 12/01/2020    Hepatitis A 07/28/2016, 01/26/2017    Hepatitis A Vaccine Pediatric / Adolescent 2 Dose IM 03/05/2014    Influenza Vaccine Quad (IIV4 PF) 81mo+ injectable 06/02/2014, 06/16/2016, 04/19/2019    Influenza Vaccine Quad (IIV4 W/PRESERV) 23MO+ 06/01/2017    Influenza Virus Vaccine, unspecified formulation 05/18/2017, 05/22/2018, 05/24/2018, 05/07/2020, 05/25/2020    PNEUMOCOCCAL POLYSACCHARIDE 23 03/05/2014    TdaP 04/19/2019       __________________________________________________________________________________________    SCREENINGS COMPLETED IN FLOWSHEETS    PHQ9  Thoughts that you would be better off dead, or of hurting yourself in some way: Not at all  PHQ-9 TOTAL SCORE: 3

## 2021-11-05 NOTE — Unmapped (Signed)
[facility] - Faculty Practice  Internal Medicine Clinic Visit    Reason for visit: unintentional weight loss; MAT    A/P:         1. Unintentional weight loss    2. Prostate cancer (CMS-HCC)    3. Iron deficiency    4. Proteinuria, unspecified type    5. Opioid use disorder, mild, in sustained remission (CMS-HCC)      1. Unintentional weight loss  HIV normal last year. Previous SVR for Hep C  Given ongoing weight loss will check basic labs   - PSA (Prostate Specific Antigen)  - Upper Endoscopy; Future  - Amb Referral to Substance Use Disorder; Future  - CBC w/ Differential  - Comprehensive Metabolic Panel  - Vitamin B12 Level  - TSH    2. Prostate cancer (CMS-HCC)  Has had undetectable PSA but will recheck in setting of ongoing weight loss  - PSA (Prostate Specific Antigen)    3. Iron deficiency  Remains on daily iron supplementation.  Recent colonoscopy was normal  - Iron Panel  - Ferritin    4. Proteinuria, unspecified type  Recheck Urine today.   - Urinalysis with Microscopy with Culture Reflex  - Urine Culture    5. Opioid use disorder, mild, in sustained remission (CMS-HCC)  Will transition his Suboxone prescription to [facility] IM. Has been stable.   Patient IMCMATSTABILITY: is stable on maintenance therapy [email or web address]: 1 month follow up, next visit in person    PRESCRIPTIONS GIVEN TODAY:  No orders of the defined types were placed in this encounter.     - Benefits of treatment outweigh the risks  - Utox today: will be positive for:  - PDMP reviewed: Appropriate on review today  - Next appt: 12/10/2021   - Substance Abuse Counseling:  not currently  - Does pt have naloxone at home and know how to use it?  yes  - Labs needed today? no    No results found for: HIV12AB  Lab Results   Component Value Date    HEPAIGG Nonreactive 07/22/2015    HEPBCAB Reactive (A) 07/22/2015    HEPCAB Positive (A) 03/05/2014     Lab Results   Component Value Date    ALKPHOS 40 (L) 11/05/2021    BILITOT 0.3 11/05/2021    BILIDIR <0.10 12/23/2020    PROT 7.0 11/05/2021    ALBUMIN 4.0 11/05/2021    ALT 19 11/05/2021    AST 33 11/05/2021     No results found for: PREGTESTUR, PREGSERUM, HCG, HCGQUANT    - Suboxone treatment agreement:  has been signed by patient and scanned into record   - Toxicology Screen, Urine      Return in about 5 weeks (around 12/10/2021).        __________________________________________________________    HPI:    Continues to loose weight.   Gets tired easily because gives out quickly. Watched grand daughter and was completely exhausted    Radiation and planted seeds as part of treatment for prostate cancer. PSA has been normal.     No fevers, night sweats, no urinary or bowel changes. No abdominal pain. Seeing RD today and working on changing what types of foods he eats.  Is going supplemental protein shakes even before seeing RD.     We review CT chest, had had MRI of abdomen, had recent colonoscopy was normal.   No issues with chewing or swallowing foods.     Normal weight all his life  had been ~150-160 lbs.     Recent skin removal for BCC on left temple  __________________________________________________________        Medications:  Reviewed in EPIC  __________________________________________________________    Physical Exam:   Vital Signs:  Vitals:    11/05/21 0837   BP: 126/59   Pulse: 69   Temp: 35.9 ??C (96.6 ??F)   TempSrc: Temporal   SpO2: 99%   Weight: 49 kg (108 lb)   Height: 167.6 cm (5' 6)          PTHomeBP    Gen: cachectic appearing, NAD  CV: RRR, no murmurs  Pulm: CTA bilaterally, no crackles or wheezes  Abd: Soft, NTND, normal BS.   Ext: No edema      PHQ-9 Score:  PHQ-9 TOTAL SCORE: 3  GAD-7 Score:       Medication adherence and barriers to the treatment plan have been addressed. Opportunities to optimize healthy behaviors have been discussed. Patient / caregiver voiced understanding.

## 2021-11-06 NOTE — Unmapped (Signed)
I was the supervising physician in the delivery of the service. Kanaan Kagawa D Tanner Yeley, MD

## 2021-11-08 MED ORDER — RIFAXIMIN 550 MG TABLET
ORAL_TABLET | Freq: Two times a day (BID) | ORAL | 5 refills | 30 days | Status: CP
Start: 2021-11-08 — End: ?
  Filled 2021-11-11: qty 60, 30d supply, fill #0

## 2021-11-09 NOTE — Unmapped (Signed)
Refill request for Xifaxan 550 mg BID    LCV 07/26/2021 with Dr. Geralyn Flash were drawn at that visit     Will authorize refill at this time

## 2021-11-22 ENCOUNTER — Ambulatory Visit: Admit: 2021-11-22 | Discharge: 2021-11-23 | Payer: PRIVATE HEALTH INSURANCE

## 2021-11-22 DIAGNOSIS — R634 Abnormal weight loss: Principal | ICD-10-CM

## 2021-11-22 DIAGNOSIS — F1111 Opioid abuse, in remission: Principal | ICD-10-CM

## 2021-11-22 LAB — TOXICOLOGY SCREEN, URINE
AMPHETAMINE SCREEN URINE: NEGATIVE
BARBITURATE SCREEN URINE: NEGATIVE
BENZODIAZEPINE SCREEN, URINE: NEGATIVE
BUPRENORPHINE, URINE SCREEN: POSITIVE — AB
CANNABINOID SCREEN URINE: NEGATIVE
COCAINE(METAB.)SCREEN, URINE: NEGATIVE
FENTANYL SCREEN, URINE: NEGATIVE
METHADONE SCREEN, URINE: NEGATIVE
OPIATE SCREEN URINE: NEGATIVE
OXYCODONE SCREEN URINE: NEGATIVE

## 2021-11-22 MED ORDER — NALOXONE 4 MG/ACTUATION NASAL SPRAY
PRN refills | 0 days | Status: CP
Start: 2021-11-22 — End: ?

## 2021-11-22 MED ORDER — BUPRENORPHINE 8 MG-NALOXONE 2 MG SUBLINGUAL FILM
ORAL_FILM | Freq: Two times a day (BID) | SUBLINGUAL | 0 refills | 28 days | Status: CP
Start: 2021-11-22 — End: 2021-11-22

## 2021-11-22 NOTE — Unmapped (Signed)
[facility]  [address], [address]  [place], [address]  Phone: [phone]  Toll Free: [phone]  Fax: [phone]      [facility] Internal Medicine Initial Visit for Suboxone Maintenance Therapy    PCP: [name], MD      ASSESSMENT    Opioid Use Disorder in Remission for 10 years with maintenance therapy    PLAN    PRESCRIPTIONS GIVEN TODAY: Suboxone, dose one 8 mg film am, one 8 mg film pm  - Was naloxone was prescribed today?  yes  - PDMP reviewed? yes  - Utox ordered and collected? yes  - MAT referral placed? yes  - EPISODE initiated in Epic?yes  - Next appt: 4 weeks in person  - Substance Abuse Counseling: Engaged with AA/NA    - LAB recommendations: Hiv negative, SVR of Hep C, Hep B immune    Social Determinants of Health with Concerns     Internet Connectivity: Not on file   Tobacco Use: High Risk    Smoking Tobacco Use: Every Day    Smokeless Tobacco Use: Former    Passive Exposure: Not on file   Substance Use: Not on file   Physical Activity: Not on file   Interpersonal Safety: Not on file   Stress: Not on file   Social Connections: Not on file         Discussed risks and benefits of suboxone treatment for opioid dependence and benefits outweigh the risks.  Discussed the details of our program including:  Active in substance abuse treatment program or an evaluation at some point  No benzodiazepines or alcohol  Regular follow up, urine screens every visit    - Suboxone treatment agreement:  [facility]) has been signed by patient and scanned into record      Other medical problems will be monitored by PCP: [name], MD      Subjective    History of recent use and prior treatment:    Patient has been on Stable Suboxone treatment in [place] for at least 10 years. This has kept him alive and he has felt like a normal person  He has been very stable      SUBSTANCE USE TREATMENT HISTORY    Current Suboxone dose: 8 mg BID  When last dose taken:this morning    - Pt is currently taking buprenorphine/naloxone SL film at a dose of 8mg  BID.  - Cravings? occasional  - Side effects? none  - Withdrawal symptoms?Restlessness  - Any medication left over today? yes  - Any non-prescribed substance use since last visit?  none  - Attending: NA meetings      LEGAL HISTORY  No    FAMILY HISTORY  Any history of substance abuse, mental illness, suicide attempt or abuse  - Two maternal uncles history of substance use     SOCIAL HISTORY  Social History     Socioeconomic History    Marital status: Married   Occupational History    Occupation: retired   Tobacco Use    Smoking status: Every Day     Packs/day: 1.00     Years: 50.00     Pack years: 50.00     Types: Cigarettes     Start date: 06/16/1969    Smokeless tobacco: Former     Types: Chew     Quit date: 2009   Substance and Sexual Activity    Alcohol use: No     Alcohol/week:  0.0 standard drinks    Drug use: Not Currently     Types: Cocaine, IV, Marijuana     Comment: History of cocaine use years ago (intranasal, IV and crack), marijuana use.     Sexual activity: Yes     Partners: Female   Other Topics Concern    Do you use sunscreen? No    Tanning bed use? No    Are you easily burned? No    Excessive sun exposure? Yes    Blistering sunburns? Yes     Social Determinants of Health     Financial Resource Strain: Low Risk     Difficulty of Paying Living Expenses: Not hard at all   Food Insecurity: No Food Insecurity    Worried About Programme researcher, broadcasting/film/video in the Last Year: Never true    Ran Out of Food in the Last Year: Never true   Transportation Needs: No Transportation Needs    Lack of Transportation (Medical): No    Lack of Transportation (Non-Medical): No         PSYCHIATRIC HISTORY  Current symptoms:  No      PMHx:   Past Medical History:   Diagnosis Date    Actinic keratosis     Asthma     Basal cell carcinoma     Chronic headaches     Colon polyp     Enlarged prostate     GERD (gastroesophageal reflux disease) 02/08/2013    Hepatitis C     Hx of substance abuse (CMS-HCC)     hx of recreational drug use (cocaine IV, intranasal and crack),     Infectious viral hepatitis     Lung nodule seen on imaging study 07/12/2016    Osteoporosis     Other emphysema (CMS-HCC)     Renal calculus     Skin cancer     Tobacco abuse 02/08/2013       H/o Hep C or HIV?: History of Hepatitis C, but now seronegative    Current medications:      Current Outpatient Medications:     albuterol HFA 90 mcg/actuation inhaler, Inhale 2 puffs every four (4) hours as needed for wheezing., Disp: 8 g, Rfl: 3    aspirin (ECOTRIN) 81 MG tablet, Take 1 tablet (81 mg total) by mouth daily., Disp: , Rfl:     atorvastatin (LIPITOR) 20 MG tablet, Take 1 tablet (20 mg total) by mouth nightly., Disp: 90 tablet, Rfl: 3    cholecalciferol, vitamin D3-25 mcg, 1,000 unit,, 25 mcg (1,000 unit) capsule, Take 1 capsule (25 mcg total) by mouth daily., Disp: , Rfl:     donepeziL (ARICEPT) 5 MG tablet, Take 2 tablets (10 mg total) by mouth in the morning., Disp: 90 tablet, Rfl: 3    ferrous sulfate 325 mg (65 mg iron) CpER, Take 1 tablet by mouth daily. , Disp: , Rfl:     fluticasone propionate (FLONASE) 50 mcg/actuation nasal spray, 1 spray into each nostril nightly., Disp: 16 g, Rfl: 0    fluticasone-umeclidin-vilanter (TRELEGY ELLIPTA) 100-62.5-25 mcg inhaler, Inhale 1 puff daily., Disp: 60 each, Rfl: 3    gabapentin (NEURONTIN) 100 MG capsule, Take 1 capsule (100 mg total) by mouth nightly., Disp: 90 capsule, Rfl: 3    guaiFENesin (MUCINEX) 600 mg 12 hr tablet, Take 1 tablet (600 mg total) by mouth every morning., Disp: , Rfl:     lactulose (CHRONULAC) 10 gram/15 mL solution, Take 15 mL (10 g total) by  mouth Two (2) times a day., Disp: 900 mL, Rfl: 6    loratadine (CLARITIN) 10 mg tablet, Take 1 tablet (10 mg total) by mouth daily., Disp: , Rfl:     mirtazapine (REMERON) 15 MG tablet, Take 1 tablet (15 mg total) by mouth nightly., Disp: 90 tablet, Rfl: 3    multivitamin (MULTIPLE VITAMIN ORAL), Take 1 tablet by mouth daily., Disp: , Rfl:     oxybutynin (DITROPAN XL) 5 MG 24 hr tablet, Take 1 tablet (5 mg total) by mouth daily., Disp: 30 tablet, Rfl: 11    rifAXIMin (XIFAXAN) 550 mg Tab, Take 1 tablet (550 mg total) by mouth Two (2) times a day., Disp: 60 tablet, Rfl: 5    tamsulosin (FLOMAX) 0.4 mg capsule, Take 1 capsule (0.4 mg total) by mouth two (2) times a day., Disp: 180 capsule, Rfl: 1    [START ON 12/06/2021] buprenorphine-naloxone (SUBOXONE) 8-2 mg sublingual film, Place 1 Film (8 mg of buprenorphine total) under the tongue Two (2) times a day for 28 days., Disp: 56 Film, Rfl: 0    naloxone (NARCAN) 4 mg nasal spray, One spray in either nostril once for known/suspected opioid overdose. May repeat every 2-3 minutes in alternating nostril til EMS arrives, Disp: 2 each, Rfl: PRN    sildenafiL (VIAGRA) 50 MG tablet, Take 1 tablet (50 mg total) by mouth daily as needed for erectile dysfunction., Disp: 30 tablet, Rfl: 1      Goals for Treatment:  1. Maintenance of remission  2. Relapse prevention  3. Spend time with grandchildren    Lane Hacker, MS3    I attest that I have reviewed the student note and that the components of the history of the present illness, the physical exam, and the assessment and plan documented were performed by me or were performed in my presence by the student where I verified the documentation and performed (or re-performed) the exam and medical decision making.     Trina Ao, MD          ______________________________________________________________________________________

## 2021-11-22 NOTE — Unmapped (Signed)
[facility] at [facility]     Reason for visit: Medication Management    Questions / Concerns that need to be addressed: Suboxone    PTHomeBP     Initial BP Reading: 123/54  72    Allergies reviewed: Yes    Medication reviewed: Yes  Pended refills? Yes        HCDM reviewed and updated in Epic:    We are working to make sure all of our patients??? wishes are updated in Epic and part of that is documenting a Environmental health practitioner for each patient  A Health Care Decision Maker is someone you choose who can make health care decisions for you if you are not able - who would you most want to do this for you????  is already up to date.      HCDM (patient stated preference): Pamala Duffel - Spouse - [phone]    HCDM, First Alternate: Gross,Christina - Daughter - [phone]    HCDM, Second Alternate: Joaquin Bend - Daughter - [phone]     BPAs completed:  NA      COVID-19 Vaccine Summary  Which COVID-19 Vaccine was administered  Moderna  Type:  Dates Given:  06/03/2021                     Immunization History   Administered Date(s) Administered    COVID-19 VAC,BIVALENT(7YR UP)BOOST,MODERNA 06/03/2021    COVID-19 VACCINE,MRNA(MODERNA)(PF) 10/07/2019, 11/05/2019, 06/21/2020, 12/01/2020    Hepatitis A 07/28/2016, 01/26/2017    Hepatitis A Vaccine Pediatric / Adolescent 2 Dose IM 03/05/2014    Influenza Vaccine Quad (IIV4 PF) 4mo+ injectable 06/02/2014, 06/16/2016, 04/19/2019    Influenza Vaccine Quad (IIV4 W/PRESERV) 63MO+ 06/01/2017, 05/12/2021    Influenza Virus Vaccine, unspecified formulation 05/18/2017, 05/22/2018, 05/24/2018, 05/07/2020, 05/25/2020    PNEUMOCOCCAL POLYSACCHARIDE 23 03/05/2014    TdaP 04/19/2019       __________________________________________________________________________________________  Screening: No  Rooming completed by Coralyn Helling

## 2021-11-23 NOTE — Unmapped (Signed)
[facility]  Pulmonary Clinic - Follow Up Visit    Referring Physician :  [name]  PCP:     Harold Richards  Reason for Consult:   Weight loss, RUL nodule    ASSESSMENT and PLAN     Harold Richards is a 66 y.o. male smoker (0.5-2ppd x70yr) with Hep C cirrhosis, moderate cognitive impairment, androgen sensitive prostate cancer s/p radiation who was referred for worsening severe protein calorie malnutrition w/ known RUL lesion & increased dyspnea. PFTs demonstrated normal lung volumes with borderline elevated RV suggestive of gas trapping, no evidence of obstruction and moderately reduced DLCO iso known emphysematous changes on CT imaging. Most recent CT chest performed 03/2021 re-demonstrated RUL subcentimeter nodule/scarring w/ 1.3 cm LAD, which is unchanged from 2021. At last visit, I discussed imaging with Harold Richards in radiology who confirmed that subcentimeter nodule would be below the resolution of PET scan and that this lesion is not cancerous given its stability over serial scans. Repeat imaging in Jan 2023 was stable, no new thoracic adenopathy. No alarming symptoms of fevers, night sweats, productive cough or hemoptysis. Originally referred due to concern for occult malignancy w/ his weight loss, which is now resolved -- weight loss is improving, following with nutrition, no evident malignancy.  Without obstruction on PFTs, it is difficult to give him a diagnosis of COPD. He and I have discussed that his continued smoking does make Trelegy less effective. That said, he has had no exacerbations in the last year, so a step down to LAMA/LABA is reasonable - with the benefit of lowering pneumonia risk with decreased inhaled corticosteroid exposure. He and I discussed continued exercise and smoking cessation extensively.    Recommendations:  - Step down to Anoro inhaler (if worsened symptoms/exacerbation can step back up to Trelegy)  - Continue exercise as tolerated  - Encouraged continued decrease in cigarette use/smoking cessation  - Resume lung cancer screening CT in Jan 2024      Diagnoses and all orders for this visit:    Centrilobular emphysema (CMS-HCC)  -     umeclidinium-vilanteroL (ANORO ELLIPTA) 62.5-25 mcg/actuation inhaler; Inhale 1 puff daily.    Tobacco use disorder      Plan of care was discussed with the patient who acknowledged understanding and is in agreement.    Patient will follow up as needed -- COPD can continue to be managed by PCP unless new concerns/questions arise.      Harold Richards was seen, examined and discussed with Dr. Netty Richards who agrees with the assessment and plan above.     Harold Richards, Harold Richards    Harold Setting. Talmadge Coventry, Richards  Pulmonary & Critical Care Fellow        HISTORY:      History of Present Illness: Harold Richards is a 66 y.o. male current 0.5 pack/day smoker (>60 pack year) with PMHx cirrhosis 2/2 Hep C, prostate ca, cognitive impairment who is seen in consultation at the request of Harold Richards, Harold Richards,* for comprehensive evaluation of weight loss, Hx pulmonary nodule, dyspnea & chronic productive cough. Unaccompanied today, but gave me permission to call his wife after our visit to discuss his care. Harold Richards notes a history of cough that is sometimes productive of thick sputum; this has been present for many years and has not changed much recently. No hemoptysis. No fevers, chills or night sweats. He does wake up with cough at night  occasionally. He feels that he gets out of breath more easily than he used to. He used to be able to walk up stairs without difficulty but now in the last few months must stop to rest on his way up if he is carrying something. Tells me that he had to retire from his work with [employer] due to shortness of breath, although Harold Richards notes mention that this was also due to memory loss. He has occasional dizziness on standing which resolves after a few moments, but no exertional lightheadedness. He has been on Trelegy since his last admission with pneumonia in Jan 2022 which required a stay overnight in the hospital for IV antibiotics. Prior to this he had had an exacerbation in Nov 2021 according to his spouse. Harold Richards is unable to give me many details about his prior hospitalizations. No prior intubations for COPD. Harold Richards has had progressive unintentional weight loss, worse over the last few months. Thinks he has lost 20 lbs. He feels he is eating the same as always - though he worries that he is eating more candy since his retirement. He is drinking 32 oz of protein shakes per day.    Called spouse Harold Richards to get her perspective. She tells me he's been dizzy a lot on standing and out of breath going up a flight of stairs. He has had significant weight loss starting in 2019. At that time, he had prostate checked, had prostate cancer, had radiation and radioactive seed therapy & lupron. She initially says that his eating habits haven't changed. Typpically drinking 4-5 boost shakes per day at ~300 cal each. For breakfast he might eat a honeybun, and he may or may not eat lunch. He will eat dinner when Harold Richards fixes it and brings it, but if she is not at home, he will not fix dinner for himself. She is still working - but Harold Richards is at home all day as he is now retired. She notes that she's not sure if he does eat lunch when she's not home. Multiple times she's come home on weekend days to find that it's 3 or 4 o'clock and he hasn't made anything, says he was waiting for her. She finds it frustrating that he could just forget to eat. When she is fixing meals, she notices that he is picking at his food and eating less. Nowadays, he might only eat half a burger, where 5 years ago he would eat a whole one. He has always been petit, often skipping meals. He weight at most 150# when they had first gotten together.   She feels his cough is the same smoker's cough he's always had. He is taking remeron & gabapentin, for sleep and appetite, but she still feels like he is tired all the time. He has no energy. They went to [place] [employer] [place] a trip and walked around in the park with their grandkids but afterwards Harold Richards needed 2 weeks to recover, sleeping for long stretches of the day. She was very surprised by how poorly he performed in the neurological testing, although she had been noticing his memory difficulties beforehand. She feels like his symptoms have gotten worse. He loses his keys frequently, puts them down in various places throughout the house and can't remember where he left them.     Smoking 0.5ppd avoiding smoking in house & truck, has been cutting back. Started smoking age 37, mostly a pack or 2 per day.     Interval Hx  November 24, 2021:  Feeling pretty good. Still dyspneic when walking the dog or walking out to his truck. Pauses until he can catch his breath then continues. Still working on decreasing smoking, at <0.5 ppd. Watching his granddaughter much of the time and doesn't smoke around her. Using trelegy. No exacerbations since last visit. Doing better on eating and has gained some weight since last visit.     Symptoms [facility]): 2-3  Exacerbations (including abx/pred use): 2-3 in last 18 months  Hospitalizations: Yes needing IV abx overnight for one of the pneumonias in 2021  Intubations: No  Alpha-1 Testing: not done, consider in future  Functional status: partially dependent  Oxygen use: None  Inhalers: Trelegy  Medications: flonase, remeron, gabapentin  Pulmonary Rehab: not at this time  Lung Cancer screening: performed  - USPSTF recommends annual screening for lung cancer with low-dose computed tomography in adults ages 55 to 87 years who have a 20 pack-year smoking history and currently smoke or have quit within the past 15 years. Screening should be discontinued once a person has not smoked for 15 years or develops a health problem that substantially limits life expectancy or the ability or willingness to have curative lung surgery  Smoking: Smoking since age 28, 1-2 ppd, down to 0.5 ppd in the last year by limiting where he allows himself to smoke  Goals of care: Not addressed this visit    Vaccinations:   Immunization History   Administered Date(s) Administered    COVID-19 VAC,BIVALENT(56YR UP),MODERNA 06/03/2021    COVID-19 VACCINE,MRNA(MODERNA)(PF) 10/07/2019, 11/05/2019, 06/21/2020, 12/01/2020    Hepatitis A 07/28/2016, 01/26/2017    Hepatitis A Vaccine Pediatric / Adolescent 2 Dose IM 03/05/2014    Influenza Vaccine Quad (IIV4 PF) 42mo+ injectable 06/02/2014, 06/16/2016, 04/19/2019    Influenza Vaccine Quad (IIV4 W/PRESERV) 87MO+ 06/01/2017, 05/12/2021    Influenza Virus Vaccine, unspecified formulation 05/18/2017, 05/22/2018, 05/24/2018, 05/07/2020, 05/25/2020    PNEUMOCOCCAL POLYSACCHARIDE 23 03/05/2014    TdaP 04/19/2019     OSH records and referral documentation reviewed.     Review of Systems:  A comprehensive review of systems was completed and negative except as noted in HPI.    PMHx:  Hepatitis C  Cirrhosis  Moderate cognitive impairment    PSHx:  Multiple endoscopies  Cervical fusion    FHx:   Mother w/ lung cancer   Aunt w/ lung cancer  Grandfarther w/ lung cancer  Uncle w/ cirrhosis  Two daughters, both healthy.     Social   Smoking (cigarettes, vaping, marijuana) - as above.   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 Now stays at home during the day, spouse is still working.     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 Mostly not wearing masks. Exposures for hours to days at a time. No known water damage/mould exposure.    Pets: No birds and dog(s)    Allergies: Reviewed.    Other History:  The past medical history, surgical history, social history, family history, medications and allergies were personally reviewed and updated in the patient's electronic medical record. Pertinent items are noted above.     Other History:  The social history and family history were personally reviewed and updated in the patient's electronic medical record.        PHYSICAL EXAM:      Physical  Exam:  Vitals:    11/24/21 0816   BP: 128/59   BP Site: L Arm   BP Position: Sitting   BP Cuff Size: Small   Pulse: 71   Temp: 36.3 ??C (97.3 ??F)   TempSrc: Temporal   SpO2: 96%   Weight: 50 kg (110 lb 3.2 oz)       Body mass index is 17.8 kg/m??.    General: Cachectic, Non-toxic. NAD.  Eyes: PERRL. Anicteric sclera.   ENT:  Nasal mucosa clear. No polyps seen. No crusting.  Lymph: No cervical or supraclavicular lymphadenopathy.  Respiratory: No wheezing, faint crackles bilaterally, comfortable WOB on RA  Cardiovascular: bradycardic RR, +s1/s2, Normal P2, no RV heave, No MRG,. No edema  Abdomen: NABS, Soft, Non-tender, non-distended.  Musculoskeletal/extremities: Normal muscle tone and strength. No cyanosis. No clubbing.   Skin: No skin rashes on clothed exam  Neuro: Alert and oriented x 3. CN grossly intact. No focal neurological deficits  Psych: mood anxious, affect congruent         LABORATORY and RADIOLOGY DATA:     Pulmonary Function Tests/Interpretation:        Pulmonary Function Test Results  Date FVC FEV1 FEV1/FVC TLC DLCO Other   12/21/17 3.15 (73%)//3.36 (78%) 2.29 (70%)//2.47 (76%) 73 // 73      08/13/21 3.48 (92%)//3.52 (93%) 2.59 (88%)//2.72 (92% 74 // 77 6.07 (98%) 14.57 (63%) RV 2.59 (112%)                PSG w titration 12/2019: CPAP 16 cm H2O  PSG 10/2019: Mod OSA a/ AHI 24.7 events/hr a/w desats to 93%, arousals & disruptions of sleep architecture    Pertinent Laboratory Data:  CBC, CMP, PT/INR, AFP & PSA dated 12/19 reviewed in epic.    Pertinent Imaging Data:  Images were personally reviewed with attending.   CT chest 03/2021, 09/2020, 12/2019: RUL nodular opacity 0.6 cm, stable over interval follow up, stable 1.3 cm nodular adenopathy    NM myocardial stress: No ischemia, mildly hyperdynamic  Echo, 2019: mildly dilated LA, preserved EF, normal RV size/fxn, mildly elevated RAP by IVC index  [place], 2019: pAF

## 2021-11-24 ENCOUNTER — Ambulatory Visit: Admit: 2021-11-24 | Discharge: 2021-11-25 | Payer: PRIVATE HEALTH INSURANCE

## 2021-11-24 DIAGNOSIS — F17200 Nicotine dependence, unspecified, uncomplicated: Principal | ICD-10-CM

## 2021-11-24 DIAGNOSIS — J432 Centrilobular emphysema: Principal | ICD-10-CM

## 2021-11-24 MED ORDER — UMECLIDINIUM 62.5 MCG-VILANTEROL 25 MCG/ACTUATION POWDR FOR INHALATION
Freq: Every day | RESPIRATORY_TRACT | 11 refills | 30 days | Status: CP
Start: 2021-11-24 — End: 2022-11-24

## 2021-11-24 NOTE — Unmapped (Addendum)
It was a pleasure seeing you in clinic today! You were seen in clinic today by Dr. Talmadge Coventry & Dr. Roselyn Bering    Here's a summary of what we discussed during your visit:  Switch from Trelegy to Anoro inhaler  Continue yearly lung cancer screening w/ PCP  STOP smoking  Keep up the good work with taking your medications, using your inhalers, exercising, and eating healthily      Follow up with me in as needed!    Ashawn Rinehart E. Talmadge Coventry, MD  [facility] Pulmonary & Critical Care Medicine      To contact your care team, you can either send a message via MyChart or contact the clinic at [phone].    How do I request medication refills?  Request a refill via [facility] (patient portal), call clinic at [phone] or have your pharmacy fax the request to [phone].    [facility]: [phone] *Pharmacy can mail medications to your home. You must call to request the medication be mailed.[facility]: [phone]  [facility]: [phone]    Here are some things you should know about contacting the [facility]:  Please be advised Epic now releases test results to MyChart as soon as they are available which means you will see your test results before I do.    The best way to reach your doctor for non-urgent matters is through MyChart. I can usually respond within two to three business day but I do not check messages after hours (evenings, weekends, and holidays) and often have other duties (inpatient hospital work, Producer, television/film/video activities, teaching) that make me unavailable.    - If you have sent a MyChart message to the clinic and have not received a response after three business days, please call our clinic at [phone] to speak to a nurse.     If you have an urgent issue that you feel needs a response the same day, you should also contact your primary care provider or be evaluated at an Urgent Care clinic.    For urgent lung issues after hours, you can call the hospital operator [phone]) and ask for the Pulmonary Fellow On Call. This doctor can provide some guidance and will send a message to your regular lung doctor the next morning.    If there is an emergency, call 911 or go to your closest emergency room.    I don't have a MyChart. Why should I get one?   - It's encrypted, so your information is secure  - It's a quick, easy way to contact the care team, manage appointments, see test results, and more!    How do I sign-up for MyChart?   - Download the MyChart app from the [employer] or [employer] and sign-up in the app  - Sign-up online at [email or web address]

## 2021-12-03 NOTE — Unmapped (Signed)
[facility] Refill Coordination Note    Specialty Medication(s) to be Shipped:   Infectious Disease: Xifaxan    Other medication(s) to be shipped: No additional medications requested for fill at this time     Harold Richards, DOB: Mar 11, 1956  Phone: [phone] (home)       All above HIPAA information was verified with patient.     Was a Nurse, learning disability used for this call? No    Completed refill call assessment today to schedule patient's medication shipment from the [facility] [phone]).  All relevant notes have been reviewed.     Specialty medication(s) and dose(s) confirmed: Regimen is correct and unchanged.   Changes to medications: Harold Richards reports no changes at this time.  Changes to insurance: No  New side effects reported not previously addressed with a pharmacist or physician: None reported  Questions for the pharmacist: No    Confirmed patient received a Conservation officer, historic buildings and a Surveyor, mining with first shipment. The patient will receive a drug information handout for each medication shipped and additional FDA Medication Guides as required.       DISEASE/MEDICATION-SPECIFIC INFORMATION        N/A    SPECIALTY MEDICATION ADHERENCE     Medication Adherence    Patient reported X missed doses in the last month: 0  Specialty Medication: xifaxan 550mg   Patient is on additional specialty medications: No  Patient is on more than two specialty medications: No              Were doses missed due to medication being on hold? No    Xifaxan 550 mg: 7 days of medicine on hand       REFERRAL TO PHARMACIST     Referral to the pharmacist: Not needed      [facility]     Shipping address confirmed in Epic.     Delivery Scheduled: Yes, Expected medication delivery date: 12/07/21.     Medication will be delivered via Next Day Courier to the prescription address in Epic WAM.    Nancy Nordmann [facility] Specialty Technician

## 2021-12-06 MED ORDER — BUPRENORPHINE 8 MG-NALOXONE 2 MG SUBLINGUAL FILM
ORAL_FILM | Freq: Two times a day (BID) | SUBLINGUAL | 0 refills | 28 days | Status: CP
Start: 2021-12-06 — End: 2022-01-03

## 2021-12-06 NOTE — Unmapped (Signed)
Harold Richards 's [facility] shipment will be delayed as a result of the medication is too soon to refill until 5/6.     I have reached out to the patient  at [phone] and communicated the delivery change. We will reschedule the medication for the delivery date that the patient agreed upon.  We have confirmed the delivery date as 5/8, via same day courier.

## 2021-12-10 ENCOUNTER — Ambulatory Visit
Admit: 2021-12-10 | Discharge: 2021-12-10 | Payer: PRIVATE HEALTH INSURANCE | Attending: Registered" | Primary: Registered"

## 2021-12-10 ENCOUNTER — Ambulatory Visit: Admit: 2021-12-10 | Discharge: 2021-12-10 | Payer: PRIVATE HEALTH INSURANCE

## 2021-12-10 DIAGNOSIS — R634 Abnormal weight loss: Principal | ICD-10-CM

## 2021-12-10 DIAGNOSIS — J438 Other emphysema: Principal | ICD-10-CM

## 2021-12-10 DIAGNOSIS — F1111 Opioid abuse, in remission: Principal | ICD-10-CM

## 2021-12-10 MED ORDER — FLUTICASONE PROPIONATE 50 MCG/ACTUATION NASAL SPRAY,SUSPENSION
Freq: Every evening | NASAL | 0 refills | 120 days | Status: CP
Start: 2021-12-10 — End: 2022-12-10

## 2021-12-10 NOTE — Unmapped (Signed)
[facility] - Faculty Practice  Internal Medicine Clinic Visit    Reason for visit: MAT and unintentional weight loss    A/P:           1. Unintentional weight loss    2. Other emphysema (CMS-HCC)    3. Opioid use disorder, mild, in sustained remission (CMS-HCC)      1. Unintentional weight loss  Has been stable this past month ~110lbs.  In past 2 years, baseline weight was ~120-125.  I am still concerned about his weight loss with no clear reason for this. MRI abdomen showed no suspicious lesions given hx of cirrhosis.  CT Chest showed stable pulmonary nodule over several years.  - EGD previously ordered and gave number to call to schedule  - PET CT Skull Base to Thigh; Future  To look for any occult malignancy no detected on CT chest or MRI abdomen    2. Other emphysema (CMS-HCC)  Stable.  Has been followed by [facility], likely that he no longer needs to continue to follow on an ongoing basis with Pulm  - fluticasone propionate (FLONASE) 50 mcg/actuation nasal spray; 1 spray into each nostril nightly.  Dispense: 16 g; Refill: 0    3. Opioid use disorder, mild, in sustained remission (CMS-HCC)  Has refill available for Suboxone, which would last past 5/30.   Patient IMCMATSTABILITY: is stable on maintenance therapy [email or web address]: 1 month follow up, next visit in person    PRESCRIPTIONS GIVEN TODAY:    Medications ordered during this encounter   Medications   ??? fluticasone propionate (FLONASE) 50 mcg/actuation nasal spray     Sig: 1 spray into each nostril nightly.     Dispense:  16 g     Refill:  0      - Benefits of treatment outweigh the risks  - Utox today: will be positive for:  - PDMP reviewed: Appropriate on review today  - Next appt: 01/04/22 @ 8:40am  - Substance Abuse Counseling:  n/a  - Does pt have naloxone at home and know how to use it?  yes  - Labs needed today? no    No results found for: HIV12AB  Lab Results   Component Value Date    HEPAIGG Nonreactive 07/22/2015    HEPBCAB Reactive (A) 07/22/2015    HEPCAB Positive (A) 03/05/2014     Lab Results   Component Value Date    ALKPHOS 40 (L) 11/05/2021    BILITOT 0.3 11/05/2021    BILIDIR <0.10 12/23/2020    PROT 7.0 11/05/2021    ALBUMIN 4.0 11/05/2021    ALT 19 11/05/2021    AST 33 11/05/2021     No results found for: PREGTESTUR, PREGSERUM, HCG, HCGQUANT    - Suboxone treatment agreement:  has been signed by patient and scanned into record        Return in about 25 days (around 01/04/2022) for In-person for MAT.        __________________________________________________________    HPI:    No acute issues.  Feeling well. Has RD appt today. Weight has been stable.  Wife has been cooking more at home. We reviewed his labs showing some mild anemia, normal iron levels; PSA was normal.  No fever, chills or night sweats. No abdominal pain.    No constipation or diarrhea. No new urinary issues.  __________________________________________________________        Medications:  Reviewed in EPIC  __________________________________________________________    Physical Exam:  Vital Signs:  Vitals:    12/10/21 0806   BP: 119/57   BP Site: L Arm   BP Position: Sitting   BP Cuff Size: Medium   Pulse: 64   Resp: 18   Temp: 36.4 ??C (97.6 ??F)   TempSrc: Temporal   SpO2: 97%   Weight: 50.3 kg (111 lb)   Height: 167.6 cm (5' 5.98)          PTHomeBP    Gen: thin and cachectic appearing, NAD  CV: RRR, no murmurs  Pulm: CTA bilaterally, no crackles or wheezes      PHQ-9 Score:     GAD-7 Score:       Medication adherence and barriers to the treatment plan have been addressed. Opportunities to optimize healthy behaviors have been discussed. Patient / caregiver voiced understanding.

## 2021-12-10 NOTE — Unmapped (Signed)
Patient Stated Health/Nutrition Goals:  Meet nutritional needs   Reduce long-term health risk    Identified treatment goals: Not applicable    Nutrition Interventions (ADVISE/ASSIST):  1. Continue goal of 300-400 calories six times a day  2. When having cereal add in 1/2 cup of nuts  3. At lunch time eat a fruit with the sandwich

## 2021-12-10 NOTE — Unmapped (Addendum)
Please call to schedule the scope of your esophagus and stomach:  GI Procedures:  Telephone # [phone] option 2    Your Suboxone prescription is at your pharmacy and is ready to be picked up.     We will cancel your visit with Dr. Altamease Oiler and I will refill your Suboxone from here on on out.

## 2021-12-10 NOTE — Unmapped (Signed)
I was the supervising physician in the delivery of the service. Kyerra Vargo D Lekeya Rollings, MD

## 2021-12-10 NOTE — Unmapped (Signed)
Enhanced Care Nutrition-Follow Up Assessment    Nutrition Assessment:  Patient did write down what he ate but reports he forgot to put in candy bars etc. It appears patient is getting in more then adequate calories from this. Patient did gain some weight since last visit.    Nutrition Diagnosis:  Inadequate Energy Intake as related to poor appetite as evidenced by weight loss    Patient Stated Health/Nutrition Goals:  Meet nutritional needs   Reduce long-term health risk    Identified treatment goals: Not applicable    Nutrition Interventions (ADVISE/ASSIST):  1. Continue goal of 300-400 calories six times a day  2. When having cereal add in 1/2 cup of nuts  3. At lunch time eat a fruit with the sandwich    ______________________________________________________________________  Referring MD or Clinic:   Kimel-Scott, Rosalio Macadamia*  [name], MD    Reason for Referral:   1. Unintentional weight loss          Medical History:  Past Medical History:   Diagnosis Date    Actinic keratosis     Asthma     Basal cell carcinoma     Chronic headaches     Colon polyp     Enlarged prostate     GERD (gastroesophageal reflux disease) 02/08/2013    Hepatitis C     Hx of substance abuse (CMS-HCC)     hx of recreational drug use (cocaine IV, intranasal and crack),     Infectious viral hepatitis     Lung nodule seen on imaging study 07/12/2016    Osteoporosis     Other emphysema (CMS-HCC)     Renal calculus     Skin cancer     Tobacco abuse 02/08/2013       Anthropometrics:    Present Weight 50.3 kg (111 lb) Current BMI 17.92   Goal Weight   120# Current IBW 64.44 kg   Present Height 167.6 cm (5' 5.98)       Weight History:  Wt Readings from Last 6 Encounters:   12/10/21 50.3 kg (111 lb)   12/10/21 50.3 kg (111 lb)   11/24/21 50 kg (110 lb 3.2 oz)   11/22/21 50.3 kg (110 lb 12.8 oz)   11/05/21 49 kg (108 lb 0.4 oz)   11/05/21 49 kg (108 lb)     Weight Change:  Patient gained 3# since last RD visit     Medications, Herbs, Supplements:  Reviewed nutritionally relevant medications and supplements.    Recent Labs:   Hemoglobin A1C (%)   Date Value   02/08/2013 5.3      No components found for: LDLCALC   BP Readings from Last 3 Encounters:   12/10/21 119/57   11/24/21 128/59   11/22/21 123/54     Lab Results   Component Value Date    CHOL 138 08/16/2016    CHOL 113 02/08/2013     Lab Results   Component Value Date    HDL 41 08/16/2016    HDL 30 (L) 02/08/2013     Lab Results   Component Value Date    LDL 80 08/16/2016    LDL 70 02/08/2013     Lab Results   Component Value Date    VLDL 17.4 08/16/2016     Lab Results   Component Value Date    CHOLHDLRATIO 3.4 08/16/2016     Lab Results   Component Value Date    TRIG 87 08/16/2016  TRIG 65 02/08/2013       Physical Activity:  Daily activities    Dietary Restrictions, Food Intolerances:  None    Other GI Issues:  Frequent stool-taking lactulose    Allergies:   Allergies   Allergen Reactions    Bicalutamide Rash    Penicillins Rash    Rocephin [Ceftriaxone] Rash     Patient received in Emergency Room and reported rash to stomach and back several hours later.        24-Hour recall/usual intake:  1st Protein shake, bowl of raisin bran  2nd Peanut butter and jelly sandwich  3rd White chili, bread  Snacks 2-3 Honey Buns, Reese's, Protein Shake 3    Other Usual Intake:  1st 10-11 AM: Cheese [place]  2nd 1-2 PM: Cheese dogs sometimes fries (Goes to [employer])  3rd 6-7 PM: Protein, starch, vegetables; Stews  Snacks chocolate, candy, graham crackers and peanut butter  Beverages: Pepsi, Protein Shakes x3 (Premier protein powder mixed in whole milk-3 for a total of 1350 kcal, 132 gms of protein)  Eating out: Daily  Cooking: Wife  Grocery shopping: Wife  Grocery shops at: [employer], [employer] benefits: None  Working Emergency planning/management officer, Engineer, 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    Behavioral Risk Factors:  N/A  Fast Eating  N/A  Overeating  N/A    Hunger and Satiety Issues:  Appetite: Improving some    Patient Questions:  None    Estimated Needs:  Estimated Energy Needs: 1800-2100 kcal    Estimated Protein Needs: 80-90 gm/day    Materials Provided To Meet their identified goals:  List of recommendations    Food labels discussed: Yes    Expected Compliance/Barriers are:   Comprehension of plan good  Readiness for change good  Ability to meet goals good    We assessed family/social/cultural characteristics and these were relevant to care: none evident    Patient has auditory or visual communication barrier or need: no    Interventions Codes:  General/healthful diet     Follow-up (ARRANGE):  4 weeks      Length of visit was 15 minutes. Patient understands diagnosis and treatment plan. Patient voices understanding. Plan is consistent with the patient???s preferences and goals.

## 2021-12-13 MED ORDER — TAMSULOSIN 0.4 MG CAPSULE
ORAL_CAPSULE | 1 refills | 0 days
Start: 2021-12-13 — End: ?

## 2021-12-13 MED FILL — XIFAXAN 550 MG TABLET: ORAL | 30 days supply | Qty: 60 | Fill #1

## 2021-12-13 NOTE — Unmapped (Signed)
Patient states via mychart that the suboxone was not called into pharmacy.  Contacted [facility] in [place] and staff advised there were 2 pending a fill that were written on 11/22/2021.  Patient advised the pharmacy was preparing it.

## 2021-12-15 MED ORDER — TAMSULOSIN 0.4 MG CAPSULE
ORAL_CAPSULE | 1 refills | 0 days | Status: CP
Start: 2021-12-15 — End: ?

## 2021-12-20 ENCOUNTER — Ambulatory Visit
Admission: EM | Admit: 2021-12-20 | Discharge: 2021-12-20 | Disposition: A | Discharge status: Home / Self Care | Payer: BC Managed Care – PPO

## 2021-12-20 ENCOUNTER — Encounter: Payer: Self-pay | Admitting: Emergency Medicine

## 2021-12-20 DIAGNOSIS — S50822A Blister (nonthermal) of left forearm, initial encounter: Secondary | ICD-10-CM | POA: Diagnosis not present

## 2021-12-20 NOTE — ED Provider Notes (Signed)
?[place] ? ? ? ?CSN: [id number] ?Arrival date & time: 12/20/21  0806 ? ? ?  ? ?History   ?Chief Complaint ?Chief Complaint  ?Patient presents with  ? Blister  ? ? ?HPI ?Vincent Walters is a 66 y.o. male.  ? ?Presents with blister to the right forearm for 1 day.  Endorses that Saturday overnight he began to have a burning sensation that awoken him from his sleep and he noticed a red bump to the forearm.  Endorses that morning site became fluid-filled and enlarged.  Denies pain, itching, drainage, fever or chills.  Has not attempted treatment of symptoms.  ? ?Past Medical History:  ?Diagnosis Date  ? Chronic pain   ? COPD (chronic obstructive pulmonary disease) ([place])   ? ? ?There are no problems to display for this patient. ? ? ?Past Surgical History:  ?Procedure Laterality Date  ? NECK SURGERY    ? X 2  ? ? ? ? ? ?Home Medications   ? ?Prior to Admission medications   ?Medication Sig Start Date End Date Taking? Authorizing Provider  ?albuterol (PROVENTIL HFA;VENTOLIN HFA) 108 (90 BASE) MCG/ACT inhaler Inhale 2 puffs into the lungs every 6 (six) hours as needed for wheezing or shortness of breath.    [provider]  ?atorvastatin (LIPITOR) 20 MG tablet Take 1 tablet by mouth daily. 10/19/20   [provider]  ?buprenorphine-naloxone (SUBOXONE) 2-0.5 MG SUBL SL tablet Place 1 tablet under the tongue daily.    [provider]  ?cholecalciferol (VITAMIN D) 1000 UNITS tablet Take 1,000 Units by mouth daily.    [provider]  ?donepezil (ARICEPT) 5 MG tablet Take 5 mg by mouth daily. 01/12/21   [provider]  ?ergocalciferol (VITAMIN D2) 50000 UNITS capsule Take 50,000 Units by mouth once a week.    [provider]  ?Fluticasone-Salmeterol (ADVAIR) 250-50 MCG/DOSE AEPB Inhale 1 puff into the lungs 2 (two) times daily.    [provider]  ?gabapentin (NEURONTIN) 100 MG capsule Take by mouth. 12/09/20 12/09/21  [provider]  ?Ganciclovir  (ZIRGAN) 0.15 % GEL Place 1 drop into the right eye 5 (five) times daily. 02/04/15   Lorin Picket, PA-C  ?mirtazapine (REMERON) 15 MG tablet Take by mouth. 12/09/20   [provider]  ?Multiple Vitamin (MULTI-VITAMINS) TABS Take 1 tablet by mouth daily.    [provider]  ?rifaximin (XIFAXAN) 550 MG TABS tablet Take by mouth. 10/27/20   [provider]  ?tamsulosin (FLOMAX) 0.4 MG CAPS capsule TAKE 1 CAPSULE(0.4 MG) BY MOUTH TWICE DAILY 10/26/20   [provider]  ?Donnal Debar 100-62.5-25 MCG/INH AEPB Inhale 1 puff into the lungs daily. 01/12/21   [provider]  ?triamcinolone ointment (KENALOG) 0.5 % Apply 1 application topically 3 (three) times daily. 01/17/21   Danton Clap, PA-C  ? ? ?Family History ?History reviewed. No pertinent family history. ? ?Social History ?Social History  ? ?Tobacco Use  ? Smoking status: Every Day  ?  Packs/day: 1.00  ?  Years: 40.00  ?  Pack years: 40.00  ?  Types: Cigarettes  ? Smokeless tobacco: Never  ?Vaping Use  ? Vaping Use: Never used  ?Substance Use Topics  ? Alcohol use: No  ?  Alcohol/week: 0.0 standard drinks  ? Drug use: No  ? ? ? ?Allergies   ?Bicalutamide, Ceftriaxone, and Penicillins ? ? ?Review of Systems ?Review of Systems  ?Constitutional: Negative.   ?Respiratory: Negative.    ?  Cardiovascular: Negative.   ?Skin:  Positive for rash. Negative for color change, pallor and wound.  ?Neurological: Negative.   ? ? ?Physical Exam ?Triage Vital Signs ?ED Triage Vitals  ?Enc Vitals Group  ?   BP 12/20/21 0815 (!) 160/67  ?   Pulse Rate 12/20/21 0815 (!) 59  ?   Resp 12/20/21 0815 18  ?   Temp 12/20/21 0815 98.3 ?F (36.8 ?C)  ?   Temp Source 12/20/21 0815 Oral  ?   SpO2 12/20/21 0815 97 %  ?   Weight 12/20/21 0814 119 lb 14.9 oz (54.4 kg)  ?   Height 12/20/21 0814 5\' 6"  (1.676 m)  ?   Head Circumference --   ?   Peak Flow --   ?   Pain Score 12/20/21 0814 0  ?   Pain Loc --   ?   Pain Edu? --   ?   Excl. in [place]? --   ? ?No  data found. ? ?Updated Vital Signs ?BP (!) 160/67 (BP Location: Left Arm)   Pulse (!) 59   Temp 98.3 ?F (36.8 ?C) (Oral)   Resp 18   Ht 5\' 6"  (1.676 m)   Wt 119 lb 14.9 oz (54.4 kg)   SpO2 97%   BMI 19.36 kg/m?  ? ?Visual Acuity ?Right Eye Distance:   ?Left Eye Distance:   ?Bilateral Distance:   ? ?Right Eye Near:   ?Left Eye Near:    ?Bilateral Near:    ? ?Physical Exam ?Constitutional:   ?   Appearance: Normal appearance.  ?HENT:  ?   Head: Normocephalic.  ?Eyes:  ?   Extraocular Movements: Extraocular movements intact.  ?Pulmonary:  ?   Effort: Pulmonary effort is normal.  ?Skin: ?   Comments: 2 x 3 cm blister present to the anterior  left forearm  ?Neurological:  ?   Mental Status: He is alert and oriented to person, place, and time. Mental status is at baseline.  ?Psychiatric:     ?   Mood and Affect: Mood normal.     ?   Behavior: Behavior normal.  ? ? ? ?UC Treatments / Results  ?Labs ?(all labs ordered are listed, but only abnormal results are displayed) ?Labs Reviewed - No data to display ? ?EKG ? ? ?Radiology ?No results found. ? ?Procedures ?Procedures (including critical care time) ? ?Medications Ordered in UC ?Medications - No data to display ? ?Initial Impression / Assessment and Plan / UC Course  ?I have reviewed the triage vital signs and the nursing notes. ? ?Pertinent labs & imaging results that were available during my care of the patient were reviewed by me and considered in my medical decision making (see chart for details). ? ?Left forearm, initial encounter ? ?Unknown etiology of symptoms, site does not appear infected today, recommended a watchful wait and allow area to heal on its own, it advised patient did not attempt to drain the blister, advised if blister drained on its own may apply topical antibiotic cream daily as a preventative measure, may follow-up with urgent care if any signs of infection occur ?Final Clinical Impressions(s) / UC Diagnoses  ? ?Final diagnoses:  ?None   ? ?Discharge Instructions   ?None ?  ? ?ED Prescriptions   ?None ?  ? ?PDMP not reviewed this encounter. ?  ?Hans Eden, NP ?12/20/21 0831 ? ?

## 2021-12-20 NOTE — ED Triage Notes (Signed)
Pt reports waking up yesterday with a "rash". States he felt something burning and it woke him up and now has a fluid filled blister on his left forearm.  ?

## 2021-12-20 NOTE — Discharge Instructions (Signed)
The blister on your arm does not appear to be related to any concerning rash, there are also no signs of infection today, you may allow this area to heal on its own ? ?Do not attempt to burst or drain the blister as this will increase your risk for an infection, it is possible that the blister will open up and drain on its own which is okay ? ?Once area is open you may apply over-the-counter topical antibiotic cream daily as a definitive measure ? ?At any point if the blister continues to increase in size, becomes reddened, becomes more swollen, becomes painful or you begin to have fever or chills please return to the urgent care for evaluation for infection ?

## 2021-12-22 NOTE — Unmapped (Signed)
I saw and evaluated the patient, participating in the key portions of the service.  I reviewed the resident’s note.  I agree with the resident’s findings and plan. Jeb Schloemer, MD

## 2021-12-23 ENCOUNTER — Ambulatory Visit: Admit: 2021-12-23 | Payer: PRIVATE HEALTH INSURANCE | Attending: Radiation Oncology | Primary: Radiation Oncology

## 2021-12-23 ENCOUNTER — Ambulatory Visit: Admit: 2021-12-23 | Discharge: 2021-12-24 | Payer: PRIVATE HEALTH INSURANCE

## 2021-12-23 DIAGNOSIS — C61 Malignant neoplasm of prostate: Principal | ICD-10-CM

## 2021-12-23 LAB — PSA: PROSTATE SPECIFIC ANTIGEN: <0.04 ng/mL (ref 0.00–4.00)

## 2021-12-23 NOTE — Unmapped (Signed)
RADIATION ONCOLOGY FOLLOW-UP VISIT NOTE     Encounter Date: 12/23/2021  Patient Name: Harold Richards  Medical Record Number: [id number]    DIAGNOSIS:  66 y.o. male with high risk prostate cancer, cT1c, Gleason 4+4=8 in 2/12 cores, PSA 4.59 s/p external beam RT (45Gy) with brachytherapy boost (brachy done 11/05/2018)    DURATION SINCE COMPLETION OF RADIOTHERAPY:  3 years, 2 months (brachy on 11/05/2018)    Lupron #1 (3 month injection) 07/17/2018  Lupron #2 (3 month injection) 10/16/2018  Lupron #3 (3 month injection) 01/16/2019  Lupron #4 (1 month injection) 04/18/2019  Eligard #5 (3 month injection) 05/29/2019  Eligard #6 (3 month injection) 08/28/2019  Eligard #7 (3 month injection) 11/27/2019    ASSESSMENT:  Disease Status: No Evidence of Disease (NED).  PSA today  <0.04    RECOMMENDATIONS:  1. PSA today is <0.04- was detectable (0.06) a few months ago (likely related to testosterone recovery)- no evidence of disease  2. ADT:  Completed- last injection 11/27/2019  3. Hot flashes:  Resolved- suspect his testosterone has recovered  4. GU: Still increased frequency, urgency from baseline.  He ran out of flomax a week ago (refill sent in but he did not pick up) and would we will trial restarting once daily flomax when he resumes- he can increase back to BID if he prefers.  No further issues with hematuria (had small amount of hematuria in early 2022 but cystoscopy was negative for bladder changes/masses- may be related to kidney stones)  5. GI:  No issues- Colonoscopy last done 08/2021  6. Weight loss:  Unclear etiology- PCP (Dr. Hulan Fess) evaluating further and he does have a PET/CT pending  7. FOLLOW-UP:  He will return in 6 months for routine follow-up and PSA check    INTERVAL HISTORY:    No major changes today from prior.  He still has some increased urinary issues from baseline (urgency, intermittently slow stream, nocturia 3-4x) but worse recently since he ran out of flomax a week ago.  I did send a refill but he has not picked that up.  No further hematuria.  No new GI issues.  He has some loose stool from lactulose but no new issues.  No pain, no bleeding.  Colonoscopy in 08/2021 showed no RT changes, no concerning polyps/masses.  No more hot flashes.  Separately he does have issues with weight loss (down 10-15Lb over the past year)- his PCP is helping to evaluate.  Otherwise staying active- finished moving, spending lots of time with grandkids, etc.    Baseline  General: Very fatigued. No ED  Urinary: On flomax, post-void residual 0 cc. Has frequency, nocturia 2-3x/night, no urgency, hematuria, dysuria.   GI: Denies loose stools, rectal pain, or bleeding.   Colonoscopy: Done 10/2015, found polyp.  Repeat in 5 years.    REVIEW OF SYSTEMS:  A comprehensive review of 10 systems was negative except for pertinent positives noted in HPI.    PAST MEDICAL HISTORY/FAMILY HISTORY/SOCIAL HISTORY:  Reviewed in EPIC    ALLERGIES/MEDICATIONS:  Reviewed in EPIC    PHYSICAL EXAM:  Vital Signs for this encounter:   BP 129/65  - Pulse 58  - Temp 36.5 ??C (97.7 ??F) (Temporal)  - Wt 50.2 kg (110 lb 9.6 oz)  - SpO2 99%  - BMI 17.86 kg/m??   Karnofsky/Lansky Performance Status: 90,  Able to carry on normal activity; minor signs or symptoms of disease (ECOG equivalent 0)  General:   No acute distress, alert  and oriented X 4   Head: Normocephalic, without obvious abnormality, atraumatic  Eyes: EOMI, no scleral icterus  Lungs: Normal work of breathing  Abdomen: non-distended  Extremities: extremities normal, atraumatic, no cyanosis or edema  Lymph nodes: No palpable supraclavicular or cervical lymphadenopathy  Neurologic: Grossly normal   Rectal: Deferred      RADIOLOGY:  No new imaging to review    Labs:    PSA   Date Value Ref Range Status   12/23/2021 <0.04 0.00 - 4.00 ng/mL Final   11/05/2021 0.06 0.00 - 4.00 ng/mL Final   06/23/2021 <0.04 0.00 - 4.00 ng/mL Final   03/23/2021 <0.04 0.00 - 4.00 ng/mL Final   12/15/2020 <0.04 0.00 - 4.00 ng/mL Final   09/15/2020 <0.04 0.00 - 4.00 ng/mL Final   05/28/2020 <0.04 0.00 - 4.00 ng/mL Final   02/27/2020 <0.04 0.00 - 4.00 ng/mL Final   08/28/2019 <0.10 0.00 - 4.00 ng/mL Final   01/16/2019 <0.10 0.00 - 4.00 ng/mL Final   05/18/2018 4.59 (H) 0.00 - 4.00 ng/mL Final   10/26/2017 3.88 0.00 - 4.00 ng/mL Final   07/27/2017 4.15 (H) 0.00 - 4.00 ng/mL Final       Rayetta Humphrey, MD  Assistant Professor  [facility] Dept of Radiation Oncology  12/23/2021

## 2021-12-27 NOTE — Unmapped (Signed)
Hi,    Patient Harold Richards called requesting a medication refill for the following:    Medication: Flomax  Dosage: 0.4 mg  Pharmacy:   [id number], [place] - [address] AT [facility] OF [address] Marcy Salvo  [address] [place], [place] [address]  Phone: [phone]  Fax: [phone]     Thank you,  Yehuda Mao  [facility]  [phone]

## 2021-12-28 MED ORDER — TAMSULOSIN 0.4 MG CAPSULE
ORAL_CAPSULE | Freq: Two times a day (BID) | ORAL | 3 refills | 90 days | Status: CP
Start: 2021-12-28 — End: ?

## 2021-12-30 NOTE — Unmapped (Signed)
[facility] - Faculty Practice  Internal Medicine Clinic Visit    Reason for visit: MAT follow-up, unintentional weight loss    A/P:           1. Unintentional weight loss    2. Opioid use disorder, mild, in sustained remission (CMS-HCC)      Unintentional weight loss:  Weight has now been stable ~ 2 months.  MRI Abdomen and CT chest have shown stable lung nodules.  PSA has remained normal.  Labs notable for mild anemia, which has been chronic. Iron levels were normal.  Continue to monitor weight closely as I will follow him monthly for MAT follow-up  Continue follow-up with RD, which has been very helpful    OUD, stable on maintenance therapy  Patient IMCMATSTABILITY: is stable on maintenance therapy [email or web address]: 1 month follow up, next visit virtual    PRESCRIPTIONS GIVEN TODAY:  No orders of the defined types were placed in this encounter.     - Benefits of treatment outweigh the risks  - Utox today: will be positive for:  - PDMP reviewed: Appropriate on review today  - Next appt: Return in about 1 month (around 02/04/2022) for Video Visit at 8:20am.  - Substance Abuse Counseling:  n/a  - Does pt have naloxone at home and know how to use it?  yes  - Labs needed today? none    No results found for: HIV12AB  Lab Results   Component Value Date    HEPAIGG Nonreactive 07/22/2015    HEPBCAB Reactive (A) 07/22/2015    HEPCAB Positive (A) 03/05/2014     Lab Results   Component Value Date    ALKPHOS 40 (L) 11/05/2021    BILITOT 0.3 11/05/2021    BILIDIR <0.10 12/23/2020    PROT 7.0 11/05/2021    ALBUMIN 4.0 11/05/2021    ALT 19 11/05/2021    AST 33 11/05/2021     No results found for: PREGTESTUR, PREGSERUM, HCG, HCGQUANT    - Suboxone treatment agreement:  has been signed by patient and scanned into record    Return in about 1 month (around 02/04/2022) for Video Visit at 8:20am.        __________________________________________________________    HPI:    Harold Richards is a 66 y.o. male who presents for OUD follow up:     Since our last visit had recent spider bite.  [facility] to [facility] evaluation.  Blister has now turned into scab.  No fevers or night sweats.    Weight has been stable.  Feels like food he is eating is better.  Has RD appt today, which he has found to be helpful.     NO issues with Suboxone      - Pt is currently taking buprenorphine/naloxone SL film at a dose of 8mg  BID.  - Cravings? none  - Side effects? none  - Withdrawal symptoms?None  - Any medication left over today? yes  - Any non-prescribed substance use since last visit?  none  - Attending: No therapy/meetings currently  __________________________________________________________        Medications:  Reviewed in EPIC  __________________________________________________________    Physical Exam:   Vital Signs:  Vitals:    01/04/22 0805   BP: 124/51   BP Site: L Arm   BP Position: Sitting   BP Cuff Size: Medium   Pulse: 51   Resp: 18   Temp: 36.3 ??C (97.3 ??F)   TempSrc: Temporal   SpO2:  97%   Weight: 50.5 kg (111 lb 6.4 oz)   Height: 167.6 cm (5' 5.98)          PTHomeBP    Gen: thin appearing, NAD  Skin:  scabbed over area 1cm x3cm on left forearm. Mild erythema, no induration or fluctuance    PHQ-9 Score:     GAD-7 Score:       Medication adherence and barriers to the treatment plan have been addressed. Opportunities to optimize healthy behaviors have been discussed. Patient / caregiver voiced understanding.

## 2021-12-30 NOTE — Unmapped (Signed)
[facility] Refill Coordination Note    Specialty Medication(s) to be Shipped:   Infectious Disease: Xifaxan    Other medication(s) to be shipped: No additional medications requested for fill at this time     Harold Richards, DOB: 06/13/1956  Phone: [phone] (home)       All above HIPAA information was verified with patient's family member, wife.     Was a Nurse, learning disability used for this call? No    Completed refill call assessment today to schedule patient's medication shipment from the [facility] [phone]).  All relevant notes have been reviewed.     Specialty medication(s) and dose(s) confirmed: Regimen is correct and unchanged.   Changes to medications: Harold Richards reports no changes at this time.  Changes to insurance: No  New side effects reported not previously addressed with a pharmacist or physician: None reported  Questions for the pharmacist: No    Confirmed patient received a Conservation officer, historic buildings and a Surveyor, mining with first shipment. The patient will receive a drug information handout for each medication shipped and additional FDA Medication Guides as required.       DISEASE/MEDICATION-SPECIFIC INFORMATION        N/A    SPECIALTY MEDICATION ADHERENCE     Medication Adherence    Patient reported X missed doses in the last month: 0  Specialty Medication: xifaxan 550mg               Were doses missed due to medication being on hold? No    Unable to confirm quantity on hand      REFERRAL TO PHARMACIST     Referral to the pharmacist: Not needed      [facility]     Shipping address confirmed in Epic.     Delivery Scheduled: Yes, Expected medication delivery date: 6/2.     Medication will be delivered via Next Day Courier to the prescription address in Epic WAM.    Harold Richards   [facility] Specialty Technician

## 2022-01-04 ENCOUNTER — Ambulatory Visit: Admit: 2022-01-04 | Discharge: 2022-01-04 | Payer: PRIVATE HEALTH INSURANCE

## 2022-01-04 ENCOUNTER — Ambulatory Visit
Admit: 2022-01-04 | Discharge: 2022-01-04 | Payer: PRIVATE HEALTH INSURANCE | Attending: Registered" | Primary: Registered"

## 2022-01-04 DIAGNOSIS — F1111 Opioid abuse, in remission: Principal | ICD-10-CM

## 2022-01-04 DIAGNOSIS — R634 Abnormal weight loss: Principal | ICD-10-CM

## 2022-01-04 DIAGNOSIS — K7469 Other cirrhosis of liver: Principal | ICD-10-CM

## 2022-01-04 MED ORDER — BUPRENORPHINE 8 MG-NALOXONE 2 MG SUBLINGUAL FILM
ORAL_FILM | Freq: Two times a day (BID) | SUBLINGUAL | 0 refills | 30 days | Status: CP
Start: 2022-01-04 — End: 2022-02-03

## 2022-01-04 NOTE — Unmapped (Signed)
Enhanced Care Nutrition-Follow Up Assessment    Nutrition Assessment:  Patient continues with adequate intake. He reports his family is happier as he is eating more now. He weight seems to be stabilizing but PCP is doing more workup. Encouraged patient to continue.    Nutrition Diagnosis:  Inadequate Energy Intake as related to poor appetite as evidenced by weight loss    Patient Stated Health/Nutrition Goals:  Meet nutritional needs   Reduce long-term health risk    Identified treatment goals: Not applicable    Nutrition Interventions (ADVISE/ASSIST):  1. Keep up the intake    ______________________________________________________________________  Referring MD or Clinic:   Kimel-Scott, Rosalio Macadamia*  [name], MD    Reason for Referral:   1. Other cirrhosis of liver (CMS-HCC)    2. Unintentional weight loss          Medical History:  Past Medical History:   Diagnosis Date    Actinic keratosis     Asthma     Basal cell carcinoma     Chronic headaches     Colon polyp     Enlarged prostate     GERD (gastroesophageal reflux disease) 02/08/2013    Hepatitis C     Hx of substance abuse (CMS-HCC)     hx of recreational drug use (cocaine IV, intranasal and crack),     Infectious viral hepatitis     Lung nodule seen on imaging study 07/12/2016    Osteoporosis     Other emphysema (CMS-HCC)     Renal calculus     Skin cancer     Tobacco abuse 02/08/2013       Anthropometrics:    Present Weight 50.3 kg (110 lb 12.8 oz) Current BMI 17.89   Goal Weight   120# Current IBW 64.44 kg   Present Height 167.6 cm (5' 5.98)       Weight History:  Wt Readings from Last 6 Encounters:   01/04/22 50.3 kg (110 lb 12.8 oz)   01/04/22 50.5 kg (111 lb 6.4 oz)   12/23/21 50.2 kg (110 lb 9.6 oz)   12/10/21 50.3 kg (111 lb)   12/10/21 50.3 kg (111 lb)   11/24/21 50 kg (110 lb 3.2 oz)     Weight Change:  Patient weight remained stable since last RD visit    Medications, Herbs, Supplements:  Reviewed nutritionally relevant medications and supplements.    Recent Labs:   Hemoglobin A1C (%)   Date Value   02/08/2013 5.3      No components found for: LDLCALC   BP Readings from Last 3 Encounters:   01/04/22 124/51   12/23/21 129/65   12/10/21 119/57     Lab Results   Component Value Date    CHOL 138 08/16/2016    CHOL 113 02/08/2013     Lab Results   Component Value Date    HDL 41 08/16/2016    HDL 30 (L) 02/08/2013     Lab Results   Component Value Date    LDL 80 08/16/2016    LDL 70 02/08/2013     Lab Results   Component Value Date    VLDL 17.4 08/16/2016     Lab Results   Component Value Date    CHOLHDLRATIO 3.4 08/16/2016     Lab Results   Component Value Date    TRIG 87 08/16/2016    TRIG 65 02/08/2013       Physical Activity:  Daily activities  Dietary Restrictions, Food Intolerances:  None    Other GI Issues:  Frequent stool-taking lactulose    Allergies:   Allergies   Allergen Reactions    Bicalutamide Rash    Penicillins Rash    Rocephin [Ceftriaxone] Rash     Patient received in Emergency Room and reported rash to stomach and back several hours later.        24-Hour recall/usual intake:  1st Missed this morning  2nd 12 oz steak, baked potato, side of ribs, rolls, Pepsi  3rd White Chili with cheese  Snacks 2 Honey Buns, 3 Hershey bars, couple Hershey PB bar, Protein shake (3)    Other Usual Intake:  1st 10-11 AM: Cheese Danish, cereal  2nd 1-2 PM: Cheese dogs sometimes fries (Goes to [employer]), egg salad sandwiches, frozen pizza  3rd 6-7 PM: Protein, starch, vegetables; Stews  Snacks chocolate, candy, graham crackers and peanut butter  Beverages: Pepsi, Protein Shakes x3 (Premier protein powder mixed in whole milk-3 for a total of 1350 kcal, 132 gms of protein)  Eating out: Daily  Cooking: Wife  Grocery shopping: Wife  Grocery shops at: [employer], [employer] benefits: None  Working Emergency planning/management officer, Engineer, 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    Behavioral Risk Factors:  N/A  Fast Eating  N/A  Overeating  N/A    Hunger and Satiety Issues:  Appetite: Improving some    Patient Questions:  None    Estimated Needs:  Estimated Energy Needs: 1800-2100 kcal    Estimated Protein Needs: 80-90 gm/day    Materials Provided To Meet their identified goals:  List of recommendations    Food labels discussed: Yes    Expected Compliance/Barriers are:   Comprehension of plan good  Readiness for change good  Ability to meet goals good    We assessed family/social/cultural characteristics and these were relevant to care: none evident    Patient has auditory or visual communication barrier or need: no    Interventions Codes:  General/healthful diet     Follow-up (ARRANGE):  2 months      Length of visit was 15 minutes. Patient understands diagnosis and treatment plan. Patient voices understanding. Plan is consistent with the patient???s preferences and goals.

## 2022-01-04 NOTE — Unmapped (Signed)
Patient Stated Health/Nutrition Goals:  Meet nutritional needs   Reduce long-term health risk    Identified treatment goals: Not applicable    Nutrition Interventions (ADVISE/ASSIST):  1. Keep up the intake

## 2022-01-04 NOTE — Unmapped (Signed)
I was the supervising physician in the delivery of the service. Gracelynn Bircher D Ulysess Witz, MD

## 2022-01-04 NOTE — Unmapped (Signed)
[facility] at [facility]     Reason for visit: Follow up    Questions / Concerns that need to be addressed: Spider bite left forearm      Omron BPs (complete if screening BP has a systolic  > 129 or diastolic > 79)  BP#1 124/51 p 56         Allergies reviewed: Yes    Medication reviewed: Yes  Pended refills? No        HCDM reviewed and updated in Epic:    We are working to make sure all of our patients??? wishes are updated in Epic and part of that is documenting a Environmental health practitioner for each patient  A Health Care Decision Maker is someone you choose who can make health care decisions for you if you are not able - who would you most want to do this for you????  is already up to date.        BPAs completed:  PHQ2  AUDIT - Alcohol Screen  HARK - Interpersonal Violence      COVID-19 Vaccine Summary  Which COVID-19 Vaccine was administered  Moderna  Type:  Dates Given:  06/03/2021                   If no: Are you interested in scheduling?     Immunization History   Administered Date(s) Administered    COVID-19 VAC,BIVALENT,MODERNA(BLUE CAP) 06/03/2021    COVID-19 VACCINE,MRNA(MODERNA)(PF) 10/07/2019, 11/05/2019, 06/21/2020, 12/01/2020    Hepatitis A 07/28/2016, 01/26/2017    Hepatitis A Vaccine Pediatric / Adolescent 2 Dose IM 03/05/2014    Influenza Vaccine Quad (IIV4 PF) 5mo+ injectable 06/02/2014, 06/16/2016, 04/19/2019    Influenza Vaccine Quad (IIV4 W/PRESERV) 68MO+ 06/01/2017, 05/12/2021    Influenza Virus Vaccine, unspecified formulation 05/18/2017, 05/22/2018, 05/24/2018, 05/07/2020, 05/25/2020    PNEUMOCOCCAL POLYSACCHARIDE 23 03/05/2014    TdaP 04/19/2019       __________________________________________________________________________________________    SCREENINGS COMPLETED IN FLOWSHEETS    HARK Screening       AUDIT  AUDIT - C Score (Part 1): 0    PHQ2       PHQ9          P4 Suicidality Screener                GAD7       COPD Assessment       Falls Risk       .imcres

## 2022-01-06 NOTE — Unmapped (Signed)
Harold Richards 's Xifaxan shipment will be delayed as a result of the medication is too soon to refill until 6/5.     I have reached out to the patient  at [phone] and communicated the delivery change. We will reschedule the medication for the delivery date that the patient agreed upon.  We have confirmed the delivery date as 6/6, via next day courier.

## 2022-01-10 MED FILL — XIFAXAN 550 MG TABLET: ORAL | 30 days supply | Qty: 60 | Fill #2

## 2022-01-27 NOTE — Unmapped (Signed)
[facility] Refill Coordination Note    Specialty Medication(s) to be Shipped:   Infectious Disease: Xifaxan    Other medication(s) to be shipped: No additional medications requested for fill at this time     Harold Richards, DOB: 09/29/1955  Phone: [phone] (home)       All above HIPAA information was verified with patient.     Was a Nurse, learning disability used for this call? No    Completed refill call assessment today to schedule patient's medication shipment from the [facility] Richards [phone]).  All relevant notes have been reviewed.     Specialty medication(s) and dose(s) confirmed: Regimen is correct and unchanged.   Changes to medications: Harold Richards reports no changes at this time.  Changes to insurance: No  New side effects reported not previously addressed with a pharmacist or physician: None reported  Questions for the pharmacist: No    Confirmed patient received a Conservation officer, historic buildings and a Surveyor, mining with first shipment. The patient will receive a drug information handout for each medication shipped and additional FDA Medication Guides as required.       DISEASE/MEDICATION-SPECIFIC INFORMATION        N/A    SPECIALTY MEDICATION ADHERENCE     Medication Adherence    Patient reported X missed doses in the last month: 0  Specialty Medication: xifaxan 550mg               Were doses missed due to medication being on hold? No    Unable to confirm quantity on hand    REFERRAL TO PHARMACIST     Referral to the pharmacist: Not needed      Harold Richards     Shipping address confirmed in Epic.     Delivery Scheduled: Yes, Expected medication delivery date: 7/3.     Medication will be delivered via Same Day Courier to the prescription address in Epic Harold Richards.    Harold Richards   [facility] Specialty Technician

## 2022-01-28 DIAGNOSIS — G9340 Encephalopathy, unspecified: Principal | ICD-10-CM

## 2022-02-04 ENCOUNTER — Telehealth: Admit: 2022-02-04 | Discharge: 2022-02-05 | Payer: PRIVATE HEALTH INSURANCE

## 2022-02-04 DIAGNOSIS — C61 Malignant neoplasm of prostate: Principal | ICD-10-CM

## 2022-02-04 DIAGNOSIS — R634 Abnormal weight loss: Principal | ICD-10-CM

## 2022-02-04 NOTE — Unmapped (Signed)
When you are here on July 18th for your appt with our dietician, please stop by the lab on the first floor to get your blood drawn.   I am still concerned about your weight loss and the fact that you have not regained this.  The blood work is to look for any other type of abnormalities, even a type of cancer.

## 2022-02-04 NOTE — Unmapped (Signed)
[facility]  Established Patient - Video Visit    This visit is conducted via video conferencing.    Contact Information  Person Contacted: Patient  Contact Phone number: [phone] (home)   Is there someone else in the room? Yes. What is your relationship? spouse. Do you want this person here for the visit? yes.  Patient agreed to a video visit    Harold Richards is a 66 y.o. male  participating in a video visit.    Reason for visit:  MAT follow-up, weight loss    Subjective:  Usually goes to bed around 9pm while watching table.   Might nap during the day if doesn't have the grandkids  Weight has been stable.  Wife notes he still doesn't eat much but probably hasn't lost more weight.     I have reviewed the problem list, medications, and allergies and have updated/reconciled them if needed.    Objective:  As part of this Video Visit, no in-person exam was conducted.  Video interaction permitted the following observations.    GEN: No acute distress.   SKIN: Color is normal. No rashes.  PSYCH: Appropriate affect, normal mood  RESP: Normal work of breathing, no retractions      Assessment & Plan:  1. Prostate cancer (CMS-HCC)    2. Unintentional weight loss        1. Prostate cancer (CMS-HCC)  Previously treated with Lupron.  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  Recheck at next visit, but likely not underlying cause as we discussed does not cause weight loss  - Testosterone, free, total; Future    2. Unintentional weight loss  Relatively stable since March 2023 after ~10-15 lb unintentional weight loss.  CT chest with stable solitary lung nodule x 3 months. MRI abdomen for [facility] surveillance with no abnormalities and aFP has been normal.  Labs largely unremarkable other than mild anemia with normal iron, folate, B12.   - Serum Protein Electrophoresis and Immunofixation; Future  - Protein Electrophoresis, Urine; Future Return in 2 months (on 04/05/2022) for In-person @8 :20am.           The patient reports they are currently: at home. I spent 10 minutes on the real-time audio and video with the patient on the date of service. I spent an additional 6 minutes on pre- and post-visit activities on the date of service.     The patient was physically located in [place] or a state in which I am permitted to provide care. The patient and/or parent/guardian understood that s/he may incur co-pays and cost sharing, and agreed to the telemedicine visit. The visit was reasonable and appropriate under the circumstances given the patient's presentation at the time.    The patient and/or parent/guardian has been advised of the potential risks and limitations of this mode of treatment (including, but not limited to, the absence of in-person examination) and has agreed to be treated using telemedicine. The patient's/patient's family's questions regarding telemedicine have been answered.     If the visit was completed in an ambulatory setting, the patient and/or parent/guardian has also been advised to contact their provider???s office for worsening conditions, and seek emergency medical treatment and/or call 911 if the patient deems either necessary.

## 2022-02-04 NOTE — Unmapped (Signed)
 Internal Medicine at [facility]     Type of visit: video    Are you located in [place]? (for virtual visits only) Yes    Reason for visit: Follow up    Questions / Concerns that need to be addressed: All is good     Screening BP- unable to obtainn at home       PTHomeBP     Diabetes:  Regularly checking blood sugars?: no  If yes, when? Complete log for past 7 days  Date Before Breakfast After Breakfast Before Lunch After Lunch Before Dinner After Dinner Before Bed                                                                                                                                   HCDM reviewed and updated in Epic:    We are working to make sure all of our patients??? wishes are updated in Epic and part of that is documenting a Environmental health practitioner for each patient  A Health Care Decision Maker is someone you choose who can make health care decisions for you if you are not able - who would you most want to do this for you????  is already up to date.    HCDM (patient stated preference): Pamala Duffel - Spouse - [phone]    HCDM, First Alternate: Katherina Right - Daughter - [phone]    HCDM, Second Alternate: Joaquin Bend - Daughter - [phone]    BPAs completed:  [phone]    COVID-19 Vaccine Summary  Which COVID-19 Vaccine was administered  Moderna  Type:  Dates Given:  06/03/2021                   If no: Are you interested in scheduling?     Immunization History   Administered Date(s) Administered    COVID-19 VAC,BIVALENT,MODERNA(BLUE CAP) 06/03/2021    COVID-19 VACCINE,MRNA(MODERNA)(PF) 10/07/2019, 11/05/2019, 06/21/2020, 12/01/2020    Hepatitis A 07/28/2016, 01/26/2017    Hepatitis A Vaccine Pediatric / Adolescent 2 Dose IM 03/05/2014    Influenza Vaccine Quad (IIV4 PF) 37mo+ injectable 06/02/2014, 06/16/2016, 04/19/2019    Influenza Vaccine Quad (IIV4 W/PRESERV) 2MO+ 06/01/2017, 05/12/2021    Influenza Virus Vaccine, unspecified formulation 05/18/2017, 05/22/2018, 05/24/2018, 05/07/2020, 05/25/2020    PNEUMOCOCCAL POLYSACCHARIDE 23 03/05/2014    TdaP 04/19/2019       __________________________________________________________________________________________    SCREENINGS COMPLETED IN FLOWSHEETS    HARK Screening       AUDIT       PHQ2  PHQ-2 Total Score : 0    PHQ9          P4 Suicidality Screener                GAD7       COPD Assessment       Falls Risk

## 2022-02-07 MED ORDER — BUPRENORPHINE 8 MG-NALOXONE 2 MG SUBLINGUAL FILM
ORAL_FILM | Freq: Two times a day (BID) | SUBLINGUAL | 0 refills | 30 days | Status: CP
Start: 2022-02-07 — End: 2022-03-09

## 2022-02-07 MED FILL — XIFAXAN 550 MG TABLET: ORAL | 30 days supply | Qty: 60 | Fill #3

## 2022-02-07 NOTE — Unmapped (Signed)
Addended by: Talbert Forest D on: 02/07/2022 09:37 AM     Modules accepted: Orders

## 2022-02-17 NOTE — Unmapped (Signed)
VM from patient's wife that patient has had some swelling in his legs and ankles and confusion    I spoke with the patient and his wife today. I asked if he has been taking his Lactulose and Xifaxan. They both said that he has been taking the Xifaxan but not the Lactulose. Patient's wife said that he restarted the Lactulose yesterday. I emphasized the importance of taking the Lactulose to prevent HE. Patient and his wife verbalized understanding.    I asked the patient and his wife about the LE swelling. They both said that he isn't having any swelling today. He had some swelling last week. He doesn't take any diuretics. I requested that they give me a call back if swelling returns.     Patient has a scheduled appointment with Annie Sable, NP on August 9th.    Lanora Manis, RN

## 2022-03-02 NOTE — Unmapped (Signed)
[facility] Clinical Assessment & Refill Coordination Note    Harold Richards, [place]: 04/26/1956  Phone: [phone] (home)     All above HIPAA information was verified with patient.     Was a Nurse, learning disability used for this call? No    Specialty Medication(s):   Infectious Disease: Xifaxan     Current Outpatient Medications   Medication Sig Dispense Refill    albuterol HFA 90 mcg/actuation inhaler Inhale 2 puffs every four (4) hours as needed for wheezing. 8 g 3    aspirin (ECOTRIN) 81 MG tablet Take 1 tablet (81 mg total) by mouth daily.      atorvastatin (LIPITOR) 20 MG tablet Take 1 tablet (20 mg total) by mouth nightly. 90 tablet 3    buprenorphine-naloxone (SUBOXONE) 8-2 mg sublingual film Place 1 Film (8 mg of buprenorphine total) under the tongue Two (2) times a day. 60 Film 0    [START ON 03/09/2022] buprenorphine-naloxone (SUBOXONE) 8-2 mg sublingual film Place 1 Film (8 mg of buprenorphine total) under the tongue Two (2) times a day for 28 days. 56 Film 0    cholecalciferol, vitamin D3-25 mcg, 1,000 unit,, 25 mcg (1,000 unit) capsule Take 1 capsule (25 mcg total) by mouth daily.      donepeziL (ARICEPT) 5 MG tablet Take 2 tablets (10 mg total) by mouth in the morning. 90 tablet 3    ferrous sulfate 325 mg (65 mg iron) CpER Take 1 tablet by mouth daily.       fluticasone propionate (FLONASE) 50 mcg/actuation nasal spray 1 spray into each nostril nightly. 16 g 0    gabapentin (NEURONTIN) 100 MG capsule Take 1 capsule (100 mg total) by mouth nightly. 90 capsule 3    guaiFENesin (MUCINEX) 600 mg 12 hr tablet Take 1 tablet (600 mg total) by mouth every morning.      lactulose (CHRONULAC) 10 gram/15 mL solution Take 15 mL (10 g total) by mouth Two (2) times a day. 900 mL 6    loratadine (CLARITIN) 10 mg tablet Take 1 tablet (10 mg total) by mouth daily.      mirtazapine (REMERON) 15 MG tablet Take 1 tablet (15 mg total) by mouth nightly. 90 tablet 3    multivitamin (MULTIPLE VITAMIN ORAL) Take 1 tablet by mouth daily.      naloxone (NARCAN) 4 mg nasal spray One spray in either nostril once for known/suspected opioid overdose. May repeat every 2-3 minutes in alternating nostril til EMS arrives 2 each PRN    oxybutynin (DITROPAN XL) 5 MG 24 hr tablet Take 1 tablet (5 mg total) by mouth daily. 30 tablet 11    rifAXIMin (XIFAXAN) 550 mg Tab Take 1 tablet (550 mg total) by mouth Two (2) times a day. 60 tablet 5    sildenafiL (VIAGRA) 50 MG tablet Take 1 tablet (50 mg total) by mouth daily as needed for erectile dysfunction. 30 tablet 1    tamsulosin (FLOMAX) 0.4 mg capsule Take 1 capsule (0.4 mg total) by mouth two (2) times a day. 180 capsule 3    umeclidinium-vilanteroL (ANORO ELLIPTA) 62.5-25 mcg/actuation inhaler Inhale 1 puff daily. 60 each 11     No current facility-administered medications for this visit.        Changes to medications: [name] reports no changes at this time.    Allergies   Allergen Reactions    Bicalutamide Rash    Penicillins Rash    Rocephin [Ceftriaxone] Rash  Patient received in Emergency Room and reported rash to stomach and back several hours later.        Changes to allergies: No    SPECIALTY MEDICATION ADHERENCE     Xifaxan 550 mg: approximately 11 days of medicine on hand       Medication Adherence    Patient reported X missed doses in the last month: 0  Specialty Medication: Xifaxan 550mg   Patient is on additional specialty medications: No  Any gaps in refill history greater than 2 weeks in the last 3 months: no  Demonstrates understanding of importance of adherence: yes  Informant: patient  Provider-estimated medication adherence level: good  Patient is at risk for Non-Adherence: No  Confirmed plan for next specialty medication refill: delivery by pharmacy  Refills needed for supportive medications: not needed          Specialty medication(s) dose(s) confirmed: Regimen is correct and unchanged.     Are there any concerns with adherence? No    Adherence counseling provided? Not needed    CLINICAL MANAGEMENT AND INTERVENTION      Clinical Benefit Assessment:    Do you feel the medicine is effective or helping your condition? Yes    Clinical Benefit counseling provided? Not needed    Adverse Effects Assessment:    Are you experiencing any side effects? No    Are you experiencing difficulty administering your medicine? No    Quality of Life Assessment:    How many days over the past month did your hepatic encephalopathy  keep you from your normal activities? For example, brushing your teeth or getting up in the morning. 0    Have you discussed this with your provider? Not needed    Acute Infection Status:    Acute infections noted within Epic:  No active infections  Patient reported infection: None    Therapy Appropriateness:    Is therapy appropriate and patient progressing towards therapeutic goals? Yes, therapy is appropriate and should be continued    DISEASE/MEDICATION-SPECIFIC INFORMATION      N/A    PATIENT SPECIFIC NEEDS     Does the patient have any physical, cognitive, or cultural barriers? No    Is the patient high risk? No    Does the patient require a Care Management Plan? No         SHIPPING     Specialty Medication(s) to be Shipped:   Infectious Disease: Xifaxan    Other medication(s) to be shipped: No additional medications requested for fill at this time     Changes to insurance: No    Delivery Scheduled: Yes, Expected medication delivery date: 03/08/22.     Medication will be delivered via Next Day Courier to the confirmed prescription address in [facility].    The patient will receive a drug information handout for each medication shipped and additional FDA Medication Guides as required.  Verified that patient has previously received a Conservation officer, historic buildings and a Surveyor, mining.    The patient or caregiver noted above participated in the development of this care plan and knows that they can request review of or adjustments to the care plan at any time. All of the patient's questions and concerns have been addressed.    Roderic Palau   [facility] Specialty Pharmacist

## 2022-03-07 MED FILL — XIFAXAN 550 MG TABLET: ORAL | 30 days supply | Qty: 60 | Fill #4

## 2022-03-09 MED ORDER — BUPRENORPHINE 8 MG-NALOXONE 2 MG SUBLINGUAL FILM
ORAL_FILM | Freq: Two times a day (BID) | SUBLINGUAL | 0 refills | 28 days | Status: CP
Start: 2022-03-09 — End: 2022-04-06

## 2022-03-16 ENCOUNTER — Ambulatory Visit: Admit: 2022-03-16 | Discharge: 2022-03-17 | Payer: PRIVATE HEALTH INSURANCE

## 2022-03-16 DIAGNOSIS — K7682 Hepatic encephalopathy (CMS-HCC): Principal | ICD-10-CM

## 2022-03-16 DIAGNOSIS — F5105 Insomnia due to other mental disorder: Principal | ICD-10-CM

## 2022-03-16 DIAGNOSIS — F4321 Adjustment disorder with depressed mood: Principal | ICD-10-CM

## 2022-03-16 DIAGNOSIS — D649 Anemia, unspecified: Principal | ICD-10-CM

## 2022-03-16 DIAGNOSIS — K7460 Unspecified cirrhosis of liver: Principal | ICD-10-CM

## 2022-03-16 LAB — COMPREHENSIVE METABOLIC PANEL
ALBUMIN: 3.8 g/dL (ref 3.4–5.0)
ALKALINE PHOSPHATASE: 43 U/L — ABNORMAL LOW (ref 46–116)
ALT (SGPT): 19 U/L (ref 10–49)
ANION GAP: 6 mmol/L (ref 5–14)
AST (SGOT): 33 U/L (ref <=34)
BILIRUBIN TOTAL: 0.2 mg/dL — ABNORMAL LOW (ref 0.3–1.2)
BLOOD UREA NITROGEN: 16 mg/dL (ref 9–23)
BUN / CREAT RATIO: 17
CALCIUM: 8.9 mg/dL (ref 8.7–10.4)
CHLORIDE: 109 mmol/L — ABNORMAL HIGH (ref 98–107)
CO2: 24.1 mmol/L (ref 20.0–31.0)
CREATININE: 0.93 mg/dL
EGFR CKD-EPI (2021) MALE: >90 mL/min/{1.73_m2} (ref >=60)
GLUCOSE RANDOM: 92 mg/dL (ref 70–179)
POTASSIUM: 4.2 mmol/L (ref 3.4–4.8)
PROTEIN TOTAL: 6.7 g/dL (ref 5.7–8.2)
SODIUM: 139 mmol/L (ref 135–145)

## 2022-03-16 LAB — HEMOGLOBIN A1C
ESTIMATED AVERAGE GLUCOSE: 105 mg/dL
HEMOGLOBIN A1C: 5.3 % (ref 4.8–5.6)

## 2022-03-16 LAB — CBC
HEMATOCRIT: 37.6 % — ABNORMAL LOW (ref 39.0–48.0)
HEMOGLOBIN: 12.6 g/dL — ABNORMAL LOW (ref 12.9–16.5)
MEAN CORPUSCULAR HEMOGLOBIN CONC: 33.5 g/dL (ref 32.0–36.0)
MEAN CORPUSCULAR HEMOGLOBIN: 34.5 pg — ABNORMAL HIGH (ref 25.9–32.4)
MEAN CORPUSCULAR VOLUME: 103 fL — ABNORMAL HIGH (ref 77.6–95.7)
MEAN PLATELET VOLUME: 6.7 fL — ABNORMAL LOW (ref 6.8–10.7)
PLATELET COUNT: 190 10*9/L (ref 150–450)
RED BLOOD CELL COUNT: 3.65 10*12/L — ABNORMAL LOW (ref 4.26–5.60)
RED CELL DISTRIBUTION WIDTH: 12.4 % (ref 12.2–15.2)
WBC ADJUSTED: 4.7 10*9/L (ref 3.6–11.2)

## 2022-03-16 LAB — PROTIME-INR
INR: 1.3
PROTIME: 14.8 s — ABNORMAL HIGH (ref 9.8–12.8)

## 2022-03-16 LAB — PROTEIN / CREATININE RATIO, URINE
CREATININE, URINE: 98 mg/dL
PROTEIN URINE: 9.4 mg/dL
PROTEIN/CREAT RATIO, URINE: 0.096

## 2022-03-16 LAB — FERRITIN: FERRITIN: 7.9 ng/mL — ABNORMAL LOW

## 2022-03-16 LAB — AFP TUMOR MARKER: AFP-TUMOR MARKER: <2 ng/mL (ref <=8)

## 2022-03-16 LAB — IRON & TIBC
IRON SATURATION: 20 % (ref 20–55)
IRON: 67 ug/dL
TOTAL IRON BINDING CAPACITY: 329 ug/dL (ref 250–425)

## 2022-03-16 LAB — FOLATE: FOLATE: 7.7 ng/mL (ref >=5.4)

## 2022-03-16 MED ORDER — LACTULOSE 10 GRAM/15 ML ORAL SOLUTION
Freq: Two times a day (BID) | ORAL | 6 refills | 30 days | Status: CP
Start: 2022-03-16 — End: ?

## 2022-03-16 NOTE — Unmapped (Signed)
Great to see you.     Today at your appointment:     - blood work. Some labs can take a while to come back. I will be in touch if there is anything that I need to discuss.   -- urine study for protein in urine  -- keep food diary for weight loss  -- try and eat more calories - continue with protein shakes  - Imaging studies ordered: MRI for liver cancer screening. If ordered at [facility], Please call Radiology at [phone] to schedule your study. They may not call you.   -- ECHO for new leg swelling  -- pls take iron ( can get over the counter)  - the following vaccines were given: shingrix  -- tylenol 2 grams instead of BC powdered  -- drink more water- less pepsi  - the following medication was ordered: Remeron  - Follow- up in 6 months    It was a pleasure caring for you today. Please contact me or my nurse, Silvestre Moment, with any questions about your care.     Harold Richards. Denton Derks, ANP- [facility]  [address]  [place] [address]  Phone [phone]       Important Contact Numbers:    Nurse:  Silvestre Moment, RN  Phone: [phone]  Main [facility]: [phone]        Radiology: [phone]- Ultrasound/ MRI  If you are being scheduled for any type of radiology, you will need to call to receive your appointment time.?Please call this number for information.?    Important Contact Numbers:     GI Clinic Appointments:? [phone]  Please call the GI Clinic appointment line if you need to schedule, reschedule or cancel an appointment in clinic.? They can also answer any questions you may have about where your appointment is located and when you need to arrive.     GI Procedure Appointments:?[phone]  Please call the GI Procedures line if you need to schedule, reschedule or cancel ANY type of procedure (EGD, colonoscopy, motility testing, etc).? You can also call this number for prep instructions, etc.?     Radiology: [phone], option 3 or 4  If you are being scheduled for any type of radiology, you will need to call to receive your appointment time.? Please call this number for information.?     For emergencies after normal business hours or on weekends/holidays  Proceed to the nearest emergency room OR contact the [facility] Operator at [phone] who can page the Gastroenterology Fellow on call.     Financial Counselors:   Tracey Harries [phone]   Dorisann Frames [phone]      Pharmacy Assistance: [phone]

## 2022-03-16 NOTE — Unmapped (Addendum)
[facility] [place] Ph [phone]      PCP:  [name], MD     Reason for Office Follow-up: Decompensated HCV cirrhosis (+HE, -ascites, -upper GI bleed). HCV treatment  ~ 2016    Presentation of Current Illness:    Harold Richards is a 66 y.o. pleasant Caucasian gentleman who presents today for follow up care. 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 Cured of HCV in 2016 s/p Harvoni x 12 weeks. He was treatment naive with genotype 1a. History of recreational drug use (cocaine IV, intranasal and crack), three tattoos with one being homemade, and incarceration. Fibroscan 20 kPa consistent with F4 (cirrhosis). PMH of prostate cancer (high risk prostate cancer, cT1c, Gleason 4+4=8 in 2/12 cores, PSA 4.59 s/p external beam RT (45Gy) with brachytherapy boost (brachy done 11/05/2018), OSA with overlap COPD, basal cell carcinoma, chronic fungal infection of maxillary sinus, pulmonary nodules, nicotine dependency, chronic headaches, and renal calculus. More recently he has been diagnosed with neurocognitive impairment and has been following with neurology. Was on remeron and aricept, but is no longer taking.       Interval hx; Today presents with his wife. LCV 07/2021 with Dr Piedad Climes and Dr Julieta Bellini. No hospitalizations or ED visits. Was having ongoing confusion with stumbling, slurring and jerking. No identifiable precipitating event. He did not go to the hospital but contacted our nurse who told him to take lactulose ( had not taken it in years). Once he started lactulose, he reports that he was better within the week. He is now taking lactulose daily as well as Xifaxan BID.No other events. Has had some BLE edema that will last for a week or so and then resolve. No edema today. Denies abdominal swelling. No pain/ redness. Elevation of legs does not seem to make much of a difference.     Still concerned about weight loss but PCP is helping to work this up. Referred to pulmonology for pulmonary nodules and 50 year pack year history. Per their note, they felt that his imaging was not concerning for malignancy. He is not taking Vit D or Iron supplement. Not watching sodium. Trying to eat more and drink protein but sometimes he forgets to eat. He Met with nutritionist. No longer engaged. Weight is stable after having lost 15 lbs in the year. Most bothersome complaint is fatigue/ exhaustion.   He does continue to smoke 1ppd,  which is reduced for him.       Cirrhosis Care:  -- Varices screen: normal platelets and spleen size with Fibroscan < 25 kPa. EGD 2021 without evidence of varices. EGD pending  (ordered by PCP  for weight loss)  -- HCC screen: MRI of abdomen 10/2021   Impression   -- Multiple LR-2 nodules, as described above, which are unchanged. No concerning hepatic lesions.   -- Undulating liver contours and left lobe hypertrophy, which can be seen with liver disease/cirrhosis.       -- Immunity hepatitis B.   -- Immunity hepatitis A: Series completed.   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      Medications:  Current Outpatient Medications   Medication Sig Dispense Refill    albuterol HFA 90 mcg/actuation inhaler Inhale 2 puffs every four (4) hours as needed for wheezing. 8 g 3    aspirin (ECOTRIN) 81 MG tablet Take 1 tablet (81 mg total) by mouth daily.      atorvastatin (LIPITOR) 20 MG tablet Take 1 tablet (20 mg total) by mouth nightly. 90 tablet 3    buprenorphine-naloxone (SUBOXONE) 8-2 mg sublingual film Place 1 Film (8 mg of buprenorphine total) under the tongue Two (2) times a day for 28 days. 56 Film 0    cholecalciferol, vitamin D3-25 mcg, 1,000 unit,, 25 mcg (1,000 unit) capsule Take 1 capsule (25 mcg total) by mouth daily.      donepeziL (ARICEPT) 5 MG tablet Take 2 tablets (10 mg total) by mouth in the morning. 90 tablet 3    ferrous sulfate 325 mg (65 mg iron) CpER Take 1 tablet by mouth daily.       fluticasone propionate (FLONASE) 50 mcg/actuation nasal spray 1 spray into each nostril nightly. 16 g 0    gabapentin (NEURONTIN) 100 MG capsule Take 1 capsule (100 mg total) by mouth nightly. 90 capsule 3    guaiFENesin (MUCINEX) 600 mg 12 hr tablet Take 1 tablet (600 mg total) by mouth every morning.      lactulose (CHRONULAC) 10 gram/15 mL solution Take 15 mL (10 g total) by mouth Two (2) times a day. 900 mL 6    loratadine (CLARITIN) 10 mg tablet Take 1 tablet (10 mg total) by mouth daily.      mirtazapine (REMERON) 15 MG tablet Take 1 tablet (15 mg total) by mouth nightly. 90 tablet 3    multivitamin (MULTIPLE VITAMIN ORAL) Take 1 tablet by mouth daily.      naloxone (NARCAN) 4 mg nasal spray One spray in either nostril once for known/suspected opioid overdose. May repeat every 2-3 minutes in alternating nostril til EMS arrives 2 each PRN    oxybutynin (DITROPAN XL) 5 MG 24 hr tablet Take 1 tablet (5 mg total) by mouth daily. 30 tablet 11    rifAXIMin (XIFAXAN) 550 mg Tab Take 1 tablet (550 mg total) by mouth Two (2) times a day. 60 tablet 5    sildenafiL (VIAGRA) 50 MG tablet Take 1 tablet (50 mg total) by mouth daily as needed for erectile dysfunction. 30 tablet 1    tamsulosin (FLOMAX) 0.4 mg capsule Take 1 capsule (0.4 mg total) by mouth two (2) times a day. 180 capsule 3    umeclidinium-vilanteroL (ANORO ELLIPTA) 62.5-25 mcg/actuation inhaler Inhale 1 puff daily. 60 each 11     No current facility-administered medications for this visit.     Active Ambulatory Problems Diagnosis Date Noted    Chronic headaches     Renal calculus     Tobacco use disorder 02/08/2013    Nasal septum perforation 02/12/2013    Insomnia 02/13/2013    Other emphysema (CMS-HCC)     Hepatitis C 03/05/2014    Osteoporosis of lumbar spine 06/09/2015    Proteinuria 09/28/2017    Prostate cancer (CMS-HCC) 09/11/2018    Cirrhosis (CMS-HCC) 03/14/2019    Community acquired pneumonia of right upper lobe of lung 04/07/2019  Sinusitis 06/24/2019    Neoplasm of maxillary sinus 06/24/2019    Opioid use disorder, mild, in sustained remission, on maintenance therapy (CMS-HCC) 02/25/2020    History of nonmelanoma skin cancer 10/14/2020     Resolved Ambulatory Problems     Diagnosis Date Noted    Chest pain 02/07/2013    GERD (gastroesophageal reflux disease) 02/08/2013    Skin lesion 02/12/2013    Sialolithiasis of submandibular gland 04/08/2015    Rib pain on left side 06/16/2016    Health care maintenance 06/16/2016    Nonspecific L Lung micronodules, repeat LDCT due 07/12/2017 07/12/2016    Atherosclerosis of coronary artery 08/16/2016    Actinic keratosis 02/01/2017    Elevated PSA 09/28/2017    Benign localized hyperplasia of prostate with urinary obstruction 09/28/2017    Abnormal prostate exam 09/28/2017    Hepatic encephalopathy (CMS-HCC) 03/14/2019     Past Medical History:   Diagnosis Date    Asthma     Basal cell carcinoma     Colon polyp     Enlarged prostate     Hx of substance abuse (CMS-HCC)     Infectious viral hepatitis     Osteoporosis     Skin cancer     Tobacco abuse 02/08/2013     Past Surgical History:   Procedure Laterality Date    CERVICAL FUSION      cage    PR COLONOSCOPY FLX DX W/COLLJ SPEC WHEN PFRMD N/A 09/01/2021    Procedure: COLONOSCOPY, FLEXIBLE, PROXIMAL TO SPLENIC FLEXURE; DIAGNOSTIC, W/WO COLLECTION SPECIMEN BY BRUSH OR WASH;  Surgeon: Liane Comber, MD;  Location: [facility]ED[facility] [place];  Service: Gastroenterology    PR COLSC FLX W/RMVL OF TUMOR POLYP LESION SNARE TQ N/A 10/20/2015    Procedure: COLONOSCOPY FLEX; W/REMOV TUMOR/LES BY SNARE;  Surgeon: Annie Paras, MD;  [facility]: [facility];  Service: Gastroenterology    PR EXCISION SUBMAXILLARY GLAND Right 03/31/2015    Procedure: EXC SUBMANDIBULAR GLAND;  Surgeon: Lauralee Evener, MD;  Location: ASC OR [facility];  Service: ENT    PR REMV UPPER JAW-MAXILLECTOMY Left 07/22/2019    Procedure: MAXILLECTOMY; WO ORBITAL EXENTERATION;  Surgeon: Adam Swaziland Kimple, MD;  Location: ASC OR [facility];  Service: ENT    PR STEREOTACTIC COMP ASSIST PROC,CRANIAL,EXTRADURAL Left 07/22/2019    Procedure: STEREOTACTIC COMPUTER-ASSISTED (NAVIGATIONAL) PROCEDURE; CRANIAL, EXTRADURAL;  Surgeon: Adam Swaziland Kimple, MD;  Location: ASC OR [facility];  Service: ENT    PR UPPER GI ENDOSCOPY,DIAGNOSIS N/A 08/18/2017    Procedure: UGI ENDO, INCLUDE ESOPHAGUS, STOMACH, & DUODENUM &/OR JEJUNUM; DX W/WO COLLECTION SPECIMN, BY BRUSH OR WASH;  Surgeon: Rona Ravens, MD;  Location: [facility]  Service: Gastroenterology    PR UPPER GI ENDOSCOPY,DIAGNOSIS N/A 11/08/2019    Procedure: UGI ENDO, INCLUDE ESOPHAGUS, STOMACH, & DUODENUM &/OR JEJUNUM; DX W/WO COLLECTION SPECIMN, BY BRUSH OR WASH;  Surgeon: Janyth Pupa, MD;  [facility]: [facility];  Service: Gastroenterology    SKIN BIOPSY       Family History   Problem Relation Age of Onset    Heart attack Father     Hypertension Father     Lung cancer Mother     Bone cancer Mother     Hypertension Mother     Hepatitis Sister     Melanoma Neg Hx       Social History     Socioeconomic History    Marital status: Married   Pensions consultant  History    Occupation: retired   Tobacco Use    Smoking status: Every Day     Packs/day: 1.00     Years: 50.00     Additional pack years: 0.00     Total pack years: 50.00     Types: Cigarettes     Start date: 06/16/1969    Smokeless tobacco: Former     Types: Chew     Quit date: 2009   Substance and Sexual Activity    Alcohol use: No Alcohol/week: 0.0 standard drinks of alcohol    Drug use: Not Currently     Types: Cocaine, IV, Marijuana     Comment: History of cocaine use years ago (intranasal, IV and crack), marijuana use.     Sexual activity: Yes     Partners: Female   Other Topics Concern    Do you use sunscreen? No    Tanning bed use? No    Are you easily burned? No    Excessive sun exposure? Yes    Blistering sunburns? Yes     Social Determinants of Health     Financial Resource Strain: Low Risk  (08/31/2021)    Overall Financial Resource Strain (CARDIA)     Difficulty of Paying Living Expenses: Not hard at all   Food Insecurity: No Food Insecurity (02/04/2022)    Hunger Vital Sign     Worried About Running Out of Food in the Last Year: Never true     Ran Out of Food in the Last Year: Never true   Transportation Needs: No Transportation Needs (02/04/2022)    PRAPARE - Therapist, art (Medical): No     Lack of Transportation (Non-Medical): No     Physical Examination:   BP 118/59  - Pulse 61  - Temp 36.4 ??C (97.5 ??F) (Temporal)  - Ht 167.6 cm (5' 6)  - Wt 50.3 kg (110 lb 12.8 oz)  - SpO2 97%  - BMI 17.88 kg/m??   Constitutional: in no apparent distress.   Eyes: Anicteric sclerae.   Cardiovascular: No peripheral edema.   Gastrointestinal: Soft, nontender abdomen without hepatosplenomegaly, hernias or masses.   Neurologic: Nonfocal, no asterixis.   Psychiatric: Alert and oriented to person, place and time. Normal affect.   Temporal wasting     Laboratory Studies:  Office Visit on 03/16/2022   Component Date Value Ref Range Status    Creat U 03/16/2022 98.0  Undefined mg/dL Final    Protein, Ur 09/60/4540 9.4  Undefined mg/dL Final    Protein/Creatinine Ratio, Urine 03/16/2022 0.096  Undefined Final    AFP-Tumor Marker 03/16/2022 <2  <=8 ng/mL Final    PT 03/16/2022 14.8 (H)  9.8 - 12.8 sec Final    INR 03/16/2022 1.30   Final    Sodium 03/16/2022 139  135 - 145 mmol/L Final    Potassium 03/16/2022 4.2  3.4 - 4.8 mmol/L Final    Chloride 03/16/2022 109 (H)  98 - 107 mmol/L Final    CO2 03/16/2022 24.1  20.0 - 31.0 mmol/L Final    Anion Gap 03/16/2022 6  5 - 14 mmol/L Final    BUN 03/16/2022 16  9 - 23 mg/dL Final    Creatinine 98/06/9146 0.93  0.60 - 1.10 mg/dL Final    BUN/Creatinine Ratio 03/16/2022 17   Final    eGFR CKD-EPI (2021) Male 03/16/2022 >90  >=60 mL/min/1.26m2 Final    Glucose 03/16/2022 92  70 - 179  mg/dL Final    Calcium 54/27/0623 8.9  8.7 - 10.4 mg/dL Final    Albumin 76/28/3151 3.8  3.4 - 5.0 g/dL Final    Total Protein 03/16/2022 6.7  5.7 - 8.2 g/dL Final    Total Bilirubin 03/16/2022 0.2 (L)  0.3 - 1.2 mg/dL Final    AST 76/16/0737 33  <=34 U/L Final    ALT 03/16/2022 19  10 - 49 U/L Final    Alkaline Phosphatase 03/16/2022 43 (L)  46 - 116 U/L Final    WBC 03/16/2022 4.7  3.6 - 11.2 10*9/L Final    RBC 03/16/2022 3.65 (L)  4.26 - 5.60 10*12/L Final    HGB 03/16/2022 12.6 (L)  12.9 - 16.5 g/dL Final    HCT 10/62/6948 37.6 (L)  39.0 - 48.0 % Final    MCV 03/16/2022 103.0 (H)  77.6 - 95.7 fL Final    MCH 03/16/2022 34.5 (H)  25.9 - 32.4 pg Final    MCHC 03/16/2022 33.5  32.0 - 36.0 g/dL Final    RDW 54/62/7035 12.4  12.2 - 15.2 % Final    MPV 03/16/2022 6.7 (L)  6.8 - 10.7 fL Final    Platelet 03/16/2022 190  150 - 450 10*9/L Final    Hemoglobin A1C 03/16/2022 5.3  4.8 - 5.6 % Final    Estimated Average Glucose 03/16/2022 105  mg/dL Final    Ferritin 00/93/8182 7.9 (L)  10.5 - 307.3 ng/mL Final    Iron 03/16/2022 67  65 - 175 ug/dL Final    TIBC 99/37/1696 329  250 - 425 ug/dL Final    Iron Saturation (%) 03/16/2022 20  20 - 55 % Final    Folate 03/16/2022 7.7  >=5.4 ng/mL Final   ]  Assessment/Plan:     #Cirrhosis: decompensated 2/2 HCV (cured) Stable MELD of 8.Fibroscan is consistent with F4 disease.   - MELD labs ordered , AFP  - Avoid alcohol use.   -HE: Continue lactulose 15 ml bid and Xifaxan 550 mg bi  -HCC screening Up to date. History of LRAD-2 and LRAD- 3 lesions in the setting of cirrhosis. q 6 months. Will repeat MRI abd. Order placed.  -Variceal Screening: No history of varices. EGD 11/2019 without varices. Repeat upper endoscopy not necessarily recommended if remains well compensated given PLT count >150 and fibroscan <20 kPa.EGD ordered for weight loss so can see if has varices and NSBB if present.   --shingles vaccine # 1 (only diluent given (safe report) so never got 1st dose, will need first dose next visit.     #New BLE: can see with cirrhosis/ portal HTN. 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 folate ordered (Macrocytic anemia likely related to cirrhosis but will check)  -- Will have EGD/ colonoscopy for malignancy work up      #sarcopenia/ weight loss: malignancy work up has been unremarkable thus far. Ongoing work up with PCP.     --Encouraged continued high protein diet/high calorie diet with protein supplementation. No ongoing weight loss. Met  with nutritionist.  Can continue with 3 boosts daily. Also discussed use of high protein greek yogurt or use of protein powders which may be more affordable.  - TSH normal, hgb A1c today  - keep food diary to ensure he is actually eating enough calories      It was my pleasure caring for this patient. Please reach out with any questions.     Reeves Forth. Coleton Woon, ANP- [facility]  [facility]  [address]  [place] [address]  [phone]

## 2022-03-21 LAB — VITAMIN D 25 HYDROXY: VITAMIN D, TOTAL (25OH): 31.7 ng/mL (ref 20.0–80.0)

## 2022-04-05 ENCOUNTER — Ambulatory Visit: Admit: 2022-04-05 | Discharge: 2022-04-06 | Payer: PRIVATE HEALTH INSURANCE

## 2022-04-05 DIAGNOSIS — M7989 Other specified soft tissue disorders: Principal | ICD-10-CM

## 2022-04-05 DIAGNOSIS — F1111 Opioid abuse, in remission: Principal | ICD-10-CM

## 2022-04-05 LAB — TOXICOLOGY SCREEN, URINE
AMPHETAMINE SCREEN URINE: NEGATIVE
BARBITURATE SCREEN URINE: NEGATIVE
BENZODIAZEPINE SCREEN, URINE: NEGATIVE
BUPRENORPHINE, URINE SCREEN: POSITIVE — AB
CANNABINOID SCREEN URINE: NEGATIVE
COCAINE(METAB.)SCREEN, URINE: NEGATIVE
FENTANYL SCREEN, URINE: NEGATIVE
METHADONE SCREEN, URINE: NEGATIVE
OPIATE SCREEN URINE: NEGATIVE
OXYCODONE SCREEN URINE: NEGATIVE

## 2022-04-05 NOTE — Unmapped (Signed)
[facility] Internal Medicine Clinic - Faculty Practice  Internal Medicine Clinic Visit    Reason for visit: suboxone refills, LE swelling    A/P:    1. Opioid use disorder, mild, in sustained remission (CMS-HCC)    2. Localized swelling of both lower extremities      1. Opioid use disorder, mild, in sustained remission (CMS-HCC)  Patient IMCMATSTABILITY: is stable on maintenance therapy [email or web address]: 2 month follow up, next visit virtual    PRESCRIPTIONS GIVEN TODAY:    Medications ordered during this encounter   Medications   ??? buprenorphine-naloxone (SUBOXONE) 8-2 mg sublingual film     Sig: Place 1 Film (8 mg of buprenorphine total) under the tongue Two (2) times a day for 28 days.     Dispense:  56 Film     Refill:  0     NADEAN:[id number]   ??? buprenorphine-naloxone (SUBOXONE) 8-2 mg sublingual film     Sig: Place 1 Film (8 mg of buprenorphine total) under the tongue Two (2) times a day for 28 days.     Dispense:  56 Film     Refill:  0      - Benefits of treatment outweigh the risks  - Utox today: will be positive for: Suboxone  - PDMP reviewed: Appropriate on review today  - Next appt: Return in about 8 weeks (around 05/31/2022) for In-person; 10/24 at 8:20am.  - Substance Abuse Counseling:  None currently  - Does pt have naloxone at home and know how to use it?  yes  - Labs needed today? no    No results found for: HIV12AB, HIVAGAB  Lab Results   Component Value Date    HEPAIGG Nonreactive 07/22/2015    HEPBCAB Reactive (A) 07/22/2015    HEPCAB Positive (A) 03/05/2014     Lab Results   Component Value Date    ALKPHOS 43 (L) 03/16/2022    BILITOT 0.2 (L) 03/16/2022    BILIDIR <0.10 12/23/2020    PROT 6.7 03/16/2022    ALBUMIN 3.8 03/16/2022    ALT 19 03/16/2022    AST 33 03/16/2022     No results found for: PREGTESTUR, PREGSERUM, HCG, HCGQUANT    - Suboxone treatment agreement:  has been signed by patient and scanned into record    2. Localized swelling of both lower extremities  Noted to hepatologist.  ECHO has been ordered.  Now resolved. Although has cirrhosis, no evidence of other decompensation and has self-resolved without intervention.  Has had fatigue, but no other sx of HF.   - complete ECHO as scheduled.   - advised to come to Same Day should it occur again      Return in about 8 weeks (around 05/31/2022) for In-person; 10/24 at 8:20am.        __________________________________________________________    HPI:    Harold Richards is a 66 y.o. male who presents for OUD follow up and follow-up of recent weight loss:     Since our last visit, weight has regained.  Has had visit with GI this month.  Reported that about 2 weeks before visit with GI had significant LE swelling.  Had lasted about 2-3 weeks.  States that it went away on its own. ECHO was ordered. At that time reports that was more tired, but no shortness of breath.  Thinks he was sleeping well at that time.     Reports that memory is better when taking lactulose regularly.  Taking twice a day now,  leading to 3-4 BM.  Before wasn't taking once a day.      No issues with breathing.  Do inhalers.              - Pt is currently taking buprenorphine/naloxone SL film at a dose of 8mg  BID.  - Cravings? none  - Side effects? none  - Withdrawal symptoms?None  - Any medication left over today? yes  - Any non-prescribed substance use since last visit?  none  - Attending: No therapy/meetings currently    __________________________________________________________        Medications:  Reviewed in EPIC  __________________________________________________________    Physical Exam:   Vital Signs:  Vitals:    04/05/22 0804   BP: 131/58   Pulse: 57   Temp: 36.3 ??C (97.4 ??F)   TempSrc: Temporal   SpO2: 97%   Weight: 51.2 kg (112 lb 12.8 oz)   Height: 167.6 cm (5' 6)          PTHomeBP    Gen: Well appearing, NAD  CV: RRR, no murmurs  Pulm: CTA bilaterally, no crackles or wheezes  Abd: Soft, NTND, normal BS.   Ext: No edema      PHQ-9 Score:     GAD-7 Score: Medication adherence and barriers to the treatment plan have been addressed. Opportunities to optimize healthy behaviors have been discussed. Patient / caregiver voiced understanding.

## 2022-04-05 NOTE — Unmapped (Signed)
[facility] Refill Coordination Note    Specialty Medication(s) to be Shipped:   Infectious Disease: Xifaxan    Other medication(s) to be shipped: No additional medications requested for fill at this time     Harold Richards, DOB: [birth year]  Phone: [phone] (home)       All above HIPAA information was verified with patient.     Was a Nurse, learning disability used for this call? No    Completed refill call assessment today to schedule patient's medication shipment from the [facility] [phone]).  All relevant notes have been reviewed.     Specialty medication(s) and dose(s) confirmed: Regimen is correct and unchanged.   Changes to medications: Harold Richards reports no changes at this time.  Changes to insurance: No  New side effects reported not previously addressed with a pharmacist or physician: None reported  Questions for the pharmacist: No    Confirmed patient received a Conservation officer, historic buildings and a Surveyor, mining with first shipment. The patient will receive a drug information handout for each medication shipped and additional FDA Medication Guides as required.       DISEASE/MEDICATION-SPECIFIC INFORMATION        N/A    SPECIALTY MEDICATION ADHERENCE     Medication Adherence    Patient reported X missed doses in the last month: 0  Specialty Medication: xifaxan 550mg   Patient is on additional specialty medications: No                                Were doses missed due to medication being on hold? No    Xifaxan 550 mg: 7 days of medicine on hand        REFERRAL TO PHARMACIST     Referral to the pharmacist: Not needed      [facility]     Shipping address confirmed in Epic.     Delivery Scheduled: Yes, Expected medication delivery date: 04/08/22.     Medication will be delivered via Next Day Courier to the prescription address in Epic WAM.    Unk Lightning   [facility] Specialty Technician

## 2022-04-05 NOTE — Unmapped (Addendum)
Get ECHO cardiogram. They should be able to do it at [facility] or at [place].      If you have leg swelling again, please call and make an appointment with our same day clinic. [phone].  Let them know that Dr. Hulan Fess requested Same Day Clinic.

## 2022-04-05 NOTE — Unmapped (Signed)
[facility] at [facility]     Type of visit: face to face    Are you located in [place]? (for virtual visits only) N/A    Reason for visit: Follow up      Screening BP- 131/58 54      HCDM reviewed and updated in Epic:    We are working to make sure all of our patients??? wishes are updated in Epic and part of that is documenting a Environmental health practitioner for each patient  A Health Care Decision Maker is someone you choose who can make health care decisions for you if you are not able - who would you most want to do this for you????  is already up to date.    HCDM (patient stated preference): Pamala Duffel - Spouse - [phone]    HCDM, First Alternate: Gross,Christina - Daughter - [phone]    HCDM, Second Alternate: Joaquin Bend - Daughter - [phone]    BPAs completed:  COPD - MMRC symptom assessment    COVID-19 Vaccine Summary  Which COVID-19 Vaccine was administered  Moderna  Type:  Dates Given:  06/03/2021                Immunization History   Administered Date(s) Administered    COVID-19 VAC,BIVALENT,MODERNA(BLUE CAP) 06/03/2021    COVID-19 VACCINE,MRNA(MODERNA)(PF) 10/07/2019, 11/05/2019, 06/21/2020, 12/01/2020    Hepatitis A 07/28/2016, 01/26/2017    Hepatitis A Vaccine Pediatric / Adolescent 2 Dose IM 03/05/2014    Influenza Vaccine Quad (IIV4 PF) 55mo+ injectable 06/02/2014, 06/16/2016, 04/19/2019    Influenza Vaccine Quad (IIV4 W/PRESERV) 66MO+ 06/01/2017, 05/12/2021    Influenza Virus Vaccine, unspecified formulation 05/18/2017, 05/22/2018, 05/24/2018, 05/07/2020, 05/25/2020    PNEUMOCOCCAL POLYSACCHARIDE 23 03/05/2014    TdaP 04/19/2019       __________________________________________________________________________________________

## 2022-04-06 MED ORDER — BUPRENORPHINE 8 MG-NALOXONE 2 MG SUBLINGUAL FILM
ORAL_FILM | Freq: Two times a day (BID) | SUBLINGUAL | 0 refills | 28 days | Status: CP
Start: 2022-04-06 — End: 2022-05-04

## 2022-04-07 MED FILL — XIFAXAN 550 MG TABLET: ORAL | 30 days supply | Qty: 60 | Fill #5

## 2022-04-22 ENCOUNTER — Ambulatory Visit: Admit: 2022-04-22 | Discharge: 2022-04-23 | Payer: PRIVATE HEALTH INSURANCE

## 2022-04-26 DIAGNOSIS — K7460 Unspecified cirrhosis of liver: Principal | ICD-10-CM

## 2022-04-27 NOTE — Unmapped (Signed)
Discussed results of ECHO. Patient would like cardiology referral.

## 2022-04-29 DIAGNOSIS — G9340 Encephalopathy, unspecified: Principal | ICD-10-CM

## 2022-04-29 MED ORDER — XIFAXAN 550 MG TABLET
ORAL_TABLET | Freq: Two times a day (BID) | ORAL | 5 refills | 30 days
Start: 2022-04-29 — End: ?

## 2022-04-29 NOTE — Unmapped (Signed)
[facility] Refill Coordination Note    Specialty Medication(s) to be Shipped:   Infectious Disease: Xifaxan    Other medication(s) to be shipped: No additional medications requested for fill at this time     Harold Richards, DOB: 05-Dec-1955  Phone: [phone] (home)       All above HIPAA information was verified with patient.     Was a Nurse, learning disability used for this call? No    Completed refill call assessment today to schedule patient's medication shipment from the [facility] [phone]).  All relevant notes have been reviewed.     Specialty medication(s) and dose(s) confirmed: Regimen is correct and unchanged.   Changes to medications: Harold Richards reports no changes at this time.  Changes to insurance: No  New side effects reported not previously addressed with a pharmacist or physician: None reported  Questions for the pharmacist: No    Confirmed patient received a Conservation officer, historic buildings and a Surveyor, mining with first shipment. The patient will receive a drug information handout for each medication shipped and additional FDA Medication Guides as required.       DISEASE/MEDICATION-SPECIFIC INFORMATION        N/A    SPECIALTY MEDICATION ADHERENCE     Medication Adherence    Patient reported X missed doses in the last month: 0  Specialty Medication: XIFAXAN 550 mg  Patient is on additional specialty medications: No                                Were doses missed due to medication being on hold? No    Xifaxan 550 mg: 7 days of medicine on hand           REFERRAL TO PHARMACIST     Referral to the pharmacist: Not needed      [facility]     Shipping address confirmed in Epic.     Delivery Scheduled: Yes, Expected medication delivery date: 05/04/22.  However, Rx request for refills was sent to the provider as there are none remaining.     Medication will be delivered via Next Day Courier to the prescription address in Epic WAM.    Unk Lightning   [facility] Specialty Technician

## 2022-04-30 MED ORDER — RIFAXIMIN 550 MG TABLET
ORAL_TABLET | Freq: Two times a day (BID) | ORAL | 5 refills | 30 days | Status: CP
Start: 2022-04-30 — End: ?
  Filled 2022-05-05: qty 60, 30d supply, fill #0

## 2022-04-30 NOTE — Unmapped (Signed)
Refill request for Xifaxan    LCV 03/16/22 with Annie Sable, NP    Labs done 03/16/22    Next visit scheduled for 09/05/22    Will authorize refill at this time    Lanora Manis, RN

## 2022-05-03 NOTE — Unmapped (Signed)
Harold Richards 's xifaxan shipment will be delayed as a result of the medication is too soon to refill until 9/28.     I have reached out to the patient  at [phone] and communicated the delivery change. We will reschedule the medication for the delivery date that the patient agreed upon.  We have confirmed the delivery date as 9/28, via same day courier.

## 2022-05-04 MED ORDER — BUPRENORPHINE 8 MG-NALOXONE 2 MG SUBLINGUAL FILM
ORAL_FILM | Freq: Two times a day (BID) | SUBLINGUAL | 0 refills | 28 days | Status: CP
Start: 2022-05-04 — End: 2022-06-01

## 2022-05-10 ENCOUNTER — Ambulatory Visit
Admit: 2022-05-10 | Discharge: 2022-05-10 | Disposition: A | Discharge status: Home / Self Care | Payer: PRIVATE HEALTH INSURANCE | Attending: Family

## 2022-05-10 ENCOUNTER — Emergency Department
Admit: 2022-05-10 | Discharge: 2022-05-10 | Disposition: A | Discharge status: Home / Self Care | Payer: PRIVATE HEALTH INSURANCE | Attending: Family

## 2022-05-10 DIAGNOSIS — N201 Calculus of ureter: Principal | ICD-10-CM

## 2022-05-10 DIAGNOSIS — N179 Acute kidney failure, unspecified: Principal | ICD-10-CM

## 2022-05-10 LAB — CBC W/ AUTO DIFF
BASOPHILS ABSOLUTE COUNT: 0 10*9/L (ref 0.0–0.1)
BASOPHILS RELATIVE PERCENT: 0.6 %
EOSINOPHILS ABSOLUTE COUNT: 0 10*9/L (ref 0.0–0.5)
EOSINOPHILS RELATIVE PERCENT: 0.3 %
HEMATOCRIT: 37.5 % — ABNORMAL LOW (ref 39.0–48.0)
HEMOGLOBIN: 12.5 g/dL — ABNORMAL LOW (ref 12.9–16.5)
LYMPHOCYTES ABSOLUTE COUNT: 0.6 10*9/L — ABNORMAL LOW (ref 1.1–3.6)
LYMPHOCYTES RELATIVE PERCENT: 7.1 %
MEAN CORPUSCULAR HEMOGLOBIN CONC: 33.3 g/dL (ref 32.0–36.0)
MEAN CORPUSCULAR HEMOGLOBIN: 34.5 pg — ABNORMAL HIGH (ref 25.9–32.4)
MEAN CORPUSCULAR VOLUME: 103.6 fL — ABNORMAL HIGH (ref 77.6–95.7)
MEAN PLATELET VOLUME: 7.1 fL (ref 6.8–10.7)
MONOCYTES ABSOLUTE COUNT: 0.8 10*9/L (ref 0.3–0.8)
MONOCYTES RELATIVE PERCENT: 9.1 %
NEUTROPHILS ABSOLUTE COUNT: 7.3 10*9/L (ref 1.8–7.8)
NEUTROPHILS RELATIVE PERCENT: 82.9 %
NUCLEATED RED BLOOD CELLS: 0 /100{WBCs} (ref <=4)
PLATELET COUNT: 179 10*9/L (ref 150–450)
RED BLOOD CELL COUNT: 3.62 10*12/L — ABNORMAL LOW (ref 4.26–5.60)
RED CELL DISTRIBUTION WIDTH: 13.3 % (ref 12.2–15.2)
WBC ADJUSTED: 8.8 10*9/L (ref 3.6–11.2)

## 2022-05-10 LAB — URINALYSIS WITH MICROSCOPY WITH CULTURE REFLEX
BACTERIA: NONE SEEN /HPF
BILIRUBIN UA: NEGATIVE
BLOOD UA: NEGATIVE
GLUCOSE UA: NEGATIVE
LEUKOCYTE ESTERASE UA: NEGATIVE
NITRITE UA: NEGATIVE
PH UA: 5 (ref 5.0–9.0)
RBC UA: 2 /HPF (ref <=3)
SPECIFIC GRAVITY UA: 1.028 (ref 1.003–1.030)
SQUAMOUS EPITHELIAL: <1 /HPF (ref 0–5)
UROBILINOGEN UA: <2
WBC UA: 1 /HPF (ref <=2)

## 2022-05-10 LAB — COMPREHENSIVE METABOLIC PANEL
ALBUMIN: 3.8 g/dL (ref 3.4–5.0)
ALKALINE PHOSPHATASE: 49 U/L (ref 46–116)
ALT (SGPT): 15 U/L (ref 10–49)
ANION GAP: 8 mmol/L (ref 5–14)
AST (SGOT): 28 U/L (ref <=34)
BILIRUBIN TOTAL: 0.2 mg/dL — ABNORMAL LOW (ref 0.3–1.2)
BLOOD UREA NITROGEN: 21 mg/dL (ref 9–23)
BUN / CREAT RATIO: 12
CALCIUM: 9 mg/dL (ref 8.7–10.4)
CHLORIDE: 111 mmol/L — ABNORMAL HIGH (ref 98–107)
CO2: 23 mmol/L (ref 20.0–31.0)
CREATININE: 1.73 mg/dL — ABNORMAL HIGH
EGFR CKD-EPI (2021) MALE: 44 mL/min/{1.73_m2} — ABNORMAL LOW (ref >=60)
GLUCOSE RANDOM: 130 mg/dL (ref 70–179)
POTASSIUM: 4.4 mmol/L (ref 3.4–4.8)
PROTEIN TOTAL: 7 g/dL (ref 5.7–8.2)
SODIUM: 142 mmol/L (ref 135–145)

## 2022-05-10 NOTE — Unmapped (Signed)
Patient presents to the ED with left flank pain that started Saturday, h/o kidney stones,  +dysuria

## 2022-05-10 NOTE — Unmapped (Addendum)
[facility]  Emergency Department Provider Note      ED Clinical Impression      Final diagnoses:   Ureterolithiasis (Primary)   AKI (acute kidney injury) (CMS-HCC)            Impression, Medical Decision Making, Progress Notes and Critical Care      Impression, Differential Diagnosis and Plan of Care    Patient is a 66 y.o. male with PMH of renal calculus, hepatic lesion, GERD, enlarged prostate, emphysema, hepatitis C, OA, BCC, and substance abuse who presents to the ED for 4 days of worsening, constant left sided flank pain that began since Saturday evening. Endorses nausea, vomiting, and dysuria.     On exam, the patient appears is frail appearing but in no acute distress. VS are notable for low grade fever of 99.9 F, weight of 102 pounds, but are otherwise WNL Physical exam is notable for diffuse left sided abdominal tenderness.     Differential includes kidney stone versus infection.    Plan for CT Abdomen Pelvis, UA w/ reflex, and basic labs. Will give IV fluids.      Additional Progress Notes    6:51 PM    Urinalysis is negative for any signs of infection.  There does appear to be a left distal ureteral stone that should be passable.  However patient does have an AKI with a creatinine of 1.7.  He was hydrated with IV fluids.  His pain is controlled here.  He is able to tolerate oral intake.  Discussed admission versus discharge with patient.  He will follow-up with his urologist in next 1 to 2 days to recheck his symptoms and creatinine.  If he is worsening he will return to the emergency department.      Portions of this record have been created using Scientist, clinical (histocompatibility and immunogenetics). Dictation errors have been sought, but may not have been identified and corrected.    See chart and resident provider documentation for details.    ____________________________________________         History        Reason for Visit  Flank Pain (/)      HPI   Harold Richards is a 66 y.o. male with a PMH of renal calculus, hepatic lesion, GERD, enlarged prostate, emphysema, hepatitis C, OA, BCC, and substance abuse who presents to the ED for flank pain. The patient reports 4 days of worsening, constant left sided flank pain that began since Saturday evening. Endorses nausea, vomiting, and dysuria. He has not taken his temperature at home. He states that his pain feels consistent with his previous kidney stones.     Outside Historian(s)  (EMS, Significant Other, Family, Parent, Caregiver, Friend, Law Enforcement, etc.)    N/A    External Records Reviewed  (Inpatient/Outpatient notes, Prior labs/imaging studies, Care Everywhere, PDMP, External ED notes, etc)    07/16/2021 MRI Abdomen (Details patient's most recent MRI which showed hepatic lesion unchanged since prior MRI)    Past Medical History:   Diagnosis Date    Actinic keratosis     Asthma     Basal cell carcinoma     Chronic headaches     Colon polyp     Enlarged prostate     GERD (gastroesophageal reflux disease) 02/08/2013    Hepatitis C     Hx of substance abuse (CMS-HCC)     hx of recreational drug use (cocaine IV, intranasal and crack),     Infectious  viral hepatitis     Lung nodule seen on imaging study 07/12/2016    Osteoporosis     Other emphysema (CMS-HCC)     Renal calculus     Skin cancer     Tobacco abuse 02/08/2013       Patient Active Problem List   Diagnosis    Chronic headaches    Renal calculus    Tobacco use disorder    Nasal septum perforation    Insomnia    Other emphysema (CMS-HCC)    Hepatitis C    Osteoporosis of lumbar spine    Proteinuria    Prostate cancer (CMS-HCC)    Cirrhosis (CMS-HCC)    Community acquired pneumonia of right upper lobe of lung    Sinusitis    Neoplasm of maxillary sinus    Opioid use disorder, mild, in sustained remission, on maintenance therapy (CMS-HCC)    History of nonmelanoma skin cancer       Past Surgical History:   Procedure Laterality Date    CERVICAL FUSION      cage    PR COLONOSCOPY FLX DX W/COLLJ SPEC WHEN PFRMD N/A 09/01/2021    Procedure: COLONOSCOPY, FLEXIBLE, PROXIMAL TO SPLENIC FLEXURE; DIAGNOSTIC, W/WO COLLECTION SPECIMEN BY BRUSH OR WASH;  Surgeon: Liane Comber, MD;  Location: [facility] [place];  Service: Gastroenterology    PR COLSC FLX W/RMVL OF TUMOR POLYP LESION SNARE TQ N/A 10/20/2015    Procedure: COLONOSCOPY FLEX; W/REMOV TUMOR/LES BY SNARE;  Surgeon: Annie Paras, MD;  [facility]: [facility];  Service: Gastroenterology    PR EXCISION SUBMAXILLARY GLAND Right 03/31/2015    Procedure: EXC SUBMANDIBULAR GLAND;  Surgeon: Lauralee Evener, MD;  Location: [facility];  Service: ENT    PR REMV UPPER JAW-MAXILLECTOMY Left 07/22/2019    Procedure: MAXILLECTOMY; WO ORBITAL EXENTERATION;  Surgeon: Adam Swaziland Kimple, MD;  Location: ASC OR [facility];  Service: ENT    PR STEREOTACTIC COMP ASSIST PROC,CRANIAL,EXTRADURAL Left 07/22/2019    Procedure: STEREOTACTIC COMPUTER-ASSISTED (NAVIGATIONAL) PROCEDURE; CRANIAL, EXTRADURAL;  Surgeon: Adam Swaziland Kimple, MD;  Location: ASC OR [facility];  Service: ENT    PR UPPER GI ENDOSCOPY,DIAGNOSIS N/A 08/18/2017    Procedure: UGI ENDO, INCLUDE ESOPHAGUS, STOMACH, & DUODENUM &/OR JEJUNUM; DX W/WO COLLECTION SPECIMN, BY BRUSH OR WASH;  Surgeon: Rona Ravens, MD;  [facility]: [facility];  Service: Gastroenterology    PR UPPER GI ENDOSCOPY,DIAGNOSIS N/A 11/08/2019    Procedure: UGI ENDO, INCLUDE ESOPHAGUS, STOMACH, & DUODENUM &/OR JEJUNUM; DX W/WO COLLECTION SPECIMN, BY BRUSH OR WASH;  Surgeon: Janyth Pupa, MD;  Location: [facility];  Service: Gastroenterology    SKIN BIOPSY         No current facility-administered medications for this encounter.    Current Outpatient Medications:     albuterol HFA 90 mcg/actuation inhaler, Inhale 2 puffs every four (4) hours as needed for wheezing., Disp: 8 g, Rfl: 3    aspirin (ECOTRIN) 81 MG tablet, Take 1 tablet (81 mg total) by mouth daily., Disp: , Rfl:     atorvastatin (LIPITOR) 20 MG tablet, Take 1 tablet (20 mg total) by mouth nightly., Disp: 90 tablet, Rfl: 3    buprenorphine-naloxone (SUBOXONE) 8-2 mg sublingual film, Place 1 Film (8 mg of buprenorphine total) under the tongue Two (2) times a day for 28 days., Disp: 56 Film, Rfl: 0    cholecalciferol, vitamin D3-25 mcg, 1,000 unit,, 25 mcg (1,000 unit) capsule, Take 1 capsule (  25 mcg total) by mouth daily., Disp: , Rfl:     lactulose 10 gram/15 mL solution, Take 15 mL (10 g total) by mouth Two (2) times a day., Disp: 900 mL, Rfl: 6    loratadine (CLARITIN) 10 mg tablet, Take 1 tablet (10 mg total) by mouth daily., Disp: , Rfl:     mirtazapine (REMERON) 15 MG tablet, Take 1 tablet (15 mg total) by mouth nightly., Disp: 90 tablet, Rfl: 3    naloxone (NARCAN) 4 mg nasal spray, One spray in either nostril once for known/suspected opioid overdose. May repeat every 2-3 minutes in alternating nostril til EMS arrives, Disp: 2 each, Rfl: PRN    rifAXIMin (XIFAXAN) 550 mg Tab, Take 1 tablet (550 mg total) by mouth Two (2) times a day., Disp: 60 tablet, Rfl: 5    sildenafiL (VIAGRA) 50 MG tablet, Take 1 tablet (50 mg total) by mouth daily as needed for erectile dysfunction., Disp: 30 tablet, Rfl: 1    tamsulosin (FLOMAX) 0.4 mg capsule, Take 1 capsule (0.4 mg total) by mouth two (2) times a day., Disp: 180 capsule, Rfl: 3    umeclidinium-vilanteroL (ANORO ELLIPTA) 62.5-25 mcg/actuation inhaler, Inhale 1 puff daily., Disp: 60 each, Rfl: 11    Allergies  Bicalutamide, Penicillins, and Rocephin [ceftriaxone]    Family History   Problem Relation Age of Onset    Heart attack Father     Hypertension Father     Lung cancer Mother     Bone cancer Mother     Hypertension Mother     Hepatitis Sister     Melanoma Neg Hx        Social History  Social History     Tobacco Use    Smoking status: Every Day     Packs/day: 1.00     Years: 50.00     Additional pack years: 0.00     Total pack years: 50.00     Types: Cigarettes     Start date: 06/16/1969    Smokeless tobacco: Former     Types: Chew     Quit date: 2009   Substance Use Topics    Alcohol use: No     Alcohol/week: 0.0 standard drinks of alcohol    Drug use: Not Currently     Types: Cocaine, IV, Marijuana     Comment: History of cocaine use years ago (intranasal, IV and crack), marijuana use.           Physical Exam     This provider entered the patient's room: Yes:    If this provider did not enter the room, a comprehensive physical exam was not able to be performed due to increased infection risk to themselves, other providers, staff and other patients), as well as to conserve personal protective equipment (PPE) utilization during the COVID-19 pandemic.    If this provider did enter the patient room, the following was PPE worn: Surgical mask, eye protection and gloves     ED Triage Vitals [05/10/22 1359]   Enc Vitals Group      BP 128/64      Heart Rate 88      SpO2 Pulse       Resp 16      Temp 37.7 ??C (99.9 ??F)      Temp Source Oral      SpO2 95 %      Weight 46.3 kg (102 lb)     Constitutional: Frail appearing. Alert and oriented. In  no distress.  Eyes: Conjunctivae are normal.  ENT       Head: Normocephalic and atraumatic.       Nose: No congestion.       Mouth/Throat: Mucous membranes are moist.       Neck: No stridor.  Hematological/Lymphatic/Immunilogical: No cervical lymphadenopathy.  Cardiovascular: Normal rate, regular rhythm. Normal and symmetric distal pulses are present in all extremities.  Respiratory: Normal respiratory effort. Breath sounds are normal.  Gastrointestinal: Diffuse left sided abdominal tenderness. Otherwise soft. There is no CVA tenderness.  Musculoskeletal: Normal range of motion in all extremities.       Right lower leg: No tenderness or edema.       Left lower leg: No tenderness or edema.  Neurologic: Normal speech and language. No gross focal neurologic deficits are appreciated.  Skin: Skin is warm, dry and intact. No rash noted.  Psychiatric: Mood and affect are normal. Speech and behavior are normal.       Radiology     CT Abdomen Pelvis Wo Contrast   Final Result      -2 mm calcification left UVJ with left perinephric stranding and moderate left hydroureteronephrosis (greater than what was seen on 11/2020). Since prior similar process described on 11/2020, there has been interval resolution of hydronephrosis (as seen on subsequent MRIs 09/09/2021 and 10/24/2021). This raises possibility of recurrent obstruction from small stone, which may be facilitated by scarring from radiation changes.      -Increased stranding of the mesenteric fat in the lower abdomen and left hemiabdomen, which may be related to fluid status and/or progressive sclerosis from radiation changes.                 Documentation assistance was provided by Lance Morin, Scribe, on May 10, 2022 at 4:17 PM for Cheri Rous, NP.       May 10, 2022 4:08 PM. Documentation assistance provided by the scribe. I was present during the time the encounter was recorded. The information recorded by the scribe was done at my direction and has been reviewed and validated by me.        Nicholes Calamity, FNP  05/10/22 1851       Nicholes Calamity, FNP  05/10/22 1930

## 2022-05-11 ENCOUNTER — Ambulatory Visit
Admit: 2022-05-11 | Discharge: 2022-05-11 | Disposition: A | Discharge status: Home / Self Care | Payer: PRIVATE HEALTH INSURANCE

## 2022-05-11 DIAGNOSIS — N200 Calculus of kidney: Principal | ICD-10-CM

## 2022-05-11 LAB — CBC W/ AUTO DIFF
BASOPHILS ABSOLUTE COUNT: 0 10*9/L (ref 0.0–0.1)
BASOPHILS RELATIVE PERCENT: 0.5 %
EOSINOPHILS ABSOLUTE COUNT: 0.1 10*9/L (ref 0.0–0.5)
EOSINOPHILS RELATIVE PERCENT: 0.6 %
HEMATOCRIT: 36.6 % — ABNORMAL LOW (ref 39.0–48.0)
HEMOGLOBIN: 12.5 g/dL — ABNORMAL LOW (ref 12.9–16.5)
LYMPHOCYTES ABSOLUTE COUNT: 1 10*9/L — ABNORMAL LOW (ref 1.1–3.6)
LYMPHOCYTES RELATIVE PERCENT: 10.6 %
MEAN CORPUSCULAR HEMOGLOBIN CONC: 34 g/dL (ref 32.0–36.0)
MEAN CORPUSCULAR HEMOGLOBIN: 35.2 pg — ABNORMAL HIGH (ref 25.9–32.4)
MEAN CORPUSCULAR VOLUME: 103.5 fL — ABNORMAL HIGH (ref 77.6–95.7)
MEAN PLATELET VOLUME: 6.4 fL — ABNORMAL LOW (ref 6.8–10.7)
MONOCYTES ABSOLUTE COUNT: 0.8 10*9/L (ref 0.3–0.8)
MONOCYTES RELATIVE PERCENT: 9.2 %
NEUTROPHILS ABSOLUTE COUNT: 7.3 10*9/L (ref 1.8–7.8)
NEUTROPHILS RELATIVE PERCENT: 79.1 %
NUCLEATED RED BLOOD CELLS: 0 /100{WBCs} (ref <=4)
PLATELET COUNT: 169 10*9/L (ref 150–450)
RED BLOOD CELL COUNT: 3.54 10*12/L — ABNORMAL LOW (ref 4.26–5.60)
RED CELL DISTRIBUTION WIDTH: 13.4 % (ref 12.2–15.2)
WBC ADJUSTED: 9.2 10*9/L (ref 3.6–11.2)

## 2022-05-11 LAB — URINALYSIS WITH MICROSCOPY WITH CULTURE REFLEX
BACTERIA: NONE SEEN /HPF
BILIRUBIN UA: NEGATIVE
GLUCOSE UA: NEGATIVE
KETONES UA: 40 — AB
LEUKOCYTE ESTERASE UA: NEGATIVE
NITRITE UA: NEGATIVE
PH UA: 5 (ref 5.0–9.0)
RBC UA: 1 /HPF (ref <=3)
SPECIFIC GRAVITY UA: 1.023 (ref 1.003–1.030)
SQUAMOUS EPITHELIAL: <1 /HPF (ref 0–5)
UROBILINOGEN UA: <2
WBC UA: <1 /HPF (ref <=2)

## 2022-05-11 LAB — LIPASE: LIPASE: 24 U/L (ref 12–53)

## 2022-05-11 LAB — COMPREHENSIVE METABOLIC PANEL
ALBUMIN: 3.7 g/dL (ref 3.4–5.0)
ALKALINE PHOSPHATASE: 52 U/L (ref 46–116)
ALT (SGPT): 14 U/L (ref 10–49)
ANION GAP: 9 mmol/L (ref 5–14)
AST (SGOT): 26 U/L (ref <=34)
BILIRUBIN TOTAL: <0.2 mg/dL — ABNORMAL LOW (ref 0.3–1.2)
BLOOD UREA NITROGEN: 18 mg/dL (ref 9–23)
BUN / CREAT RATIO: 12
CALCIUM: 8.7 mg/dL (ref 8.7–10.4)
CHLORIDE: 112 mmol/L — ABNORMAL HIGH (ref 98–107)
CO2: 20.9 mmol/L (ref 20.0–31.0)
CREATININE: 1.48 mg/dL — ABNORMAL HIGH
EGFR CKD-EPI (2021) MALE: 53 mL/min/{1.73_m2} — ABNORMAL LOW (ref >=60)
GLUCOSE RANDOM: 135 mg/dL (ref 70–179)
POTASSIUM: 4 mmol/L (ref 3.4–4.8)
PROTEIN TOTAL: 6.5 g/dL (ref 5.7–8.2)
SODIUM: 142 mmol/L (ref 135–145)

## 2022-05-11 MED ORDER — OXYCODONE 5 MG TABLET
ORAL_TABLET | ORAL | 0 refills | 2 days | Status: CP | PRN
Start: 2022-05-11 — End: 2022-05-16

## 2022-05-11 MED ADMIN — ketorolac (TORADOL) injection 15 mg: 15 mg | INTRAVENOUS | @ 09:00:00 | Stop: 2022-05-11

## 2022-05-11 MED ADMIN — lactated ringers bolus 1,000 mL: 1000 mL | INTRAVENOUS | @ 09:00:00 | Stop: 2022-05-11

## 2022-05-11 MED ADMIN — morphine injection 2 mg: 2 mg | INTRAVENOUS | @ 10:00:00 | Stop: 2022-05-11

## 2022-05-11 MED ADMIN — morphine 4 mg/mL injection 4 mg: 4 mg | INTRAVENOUS | @ 09:00:00 | Stop: 2022-05-11

## 2022-05-11 NOTE — Unmapped (Signed)
History of stones in the past. Started having right flank pain Saturday, worse tonight. Having dysuria.

## 2022-05-11 NOTE — Unmapped (Signed)
[facility]  Emergency Department Provider Note     ED Clinical Impression     Final diagnoses:   Nephrolithiasis (Primary)      HPI, Medical Decision Making, ED Course     HPI: 66 y.o. male with past medical history of nephrolithiasis, hepatic lesion, GERD, BPH, emphysema, hepatitis C, prior substance abuse on Suboxone, who presents to the emergency department with concerns for roughly 4 days of worsening left-sided flank pain.  Patient reports that flank pain began on Saturday evening and has been constant since that time.  Pain is described as consistent with past kidney stones. He was seen in this emergency department yesterday at 4 PM, roughly 12 hours ago, work-up at that time demonstrated an AKI with creatinine of 1.7, CT scan with left distal ureteral stone 2 mm and small amount of hydronephrosis, no evidence of infection.  Patient was ultimately discharged utilizing shared decision making.  Since that time, he reports ongoing worsening pain.    DDx/MDM: Patient's presentation today consistent with pain from known nephrolithiasis.  Plan for repeat labs to evaluate kidney function, pain control with Toradol, IV fluids.  Disposition pending response to pain control.    Orders Placed This Encounter   Procedures    Comprehensive Metabolic Panel    Lipase Level    CBC w/ Differential    Urinalysis with Microscopy with Culture Reflex     ED Course  ED Course as of 05/11/22 0550   Wed May 11, 2022   0517 Creatinine(!): 1.48  Improved from previous of 1.73.   0517 Reevaluated patient after administration of Toradol, he reports ongoing pain. Did discuss with patient opioid therapy in setting of ongoing Suboxone, he is okay with receiving morphine.  Morphine has been ordered.   0543 Reevaluated patient after initial dose of morphine, he reports pain has significantly improved.  Given downtrending creatinine and ongoing lack of evidence of infection on urinalysis, believe that patient would be okay for discharge at this time.  Given recurrent presentation and clearly significant amounts of pain, will plan to discharge patient with short course of opiates for pain control.  Patient expressed understanding of plan for discharge and return precautions.  Patient is okay for discharge.       Discussion of Management with other Physicians, QHP, or Appropriate Source: None  Independent Interpretation of Studies: If applicable, documented in ED Course above.  External Records Reviewed: I have reviewed recent and relevant previous record, including: Outpatient notes - OUD clinic visit from 04/05/2022 where patient was prescribed Suboxone  Escalation of Care, Consideration of Admission/Observation/Transfer: Admission not required. Appropriate for outpatient management.  Social determinants that significantly affected care: Substance abuse  Diagnostic tests considered but not performed:  Considered CT scan, though patient already received CT scan earlier today.    ____________________________________________    The case was discussed with the attending physician, who is in agreement with the above assessment and plan.     Additional History Elements     Chief Complaint  Chief Complaint   Patient presents with    Flank Pain       Outside Historian(s): I have obtained additional independent history/collateral from the patient's spouse.    Past Medical History:   Diagnosis Date    Actinic keratosis     Asthma     Basal cell carcinoma     Chronic headaches     Colon polyp     Enlarged prostate     GERD (gastroesophageal  reflux disease) 02/08/2013    Hepatitis C     Hx of substance abuse (CMS-HCC)     hx of recreational drug use (cocaine IV, intranasal and crack),     Infectious viral hepatitis     Lung nodule seen on imaging study 07/12/2016    Osteoporosis     Other emphysema (CMS-HCC)     Renal calculus     Skin cancer     Tobacco abuse 02/08/2013       Past Surgical History:   Procedure Laterality Date    CERVICAL FUSION      cage    PR COLONOSCOPY FLX DX W/COLLJ SPEC WHEN PFRMD N/A 09/01/2021    Procedure: COLONOSCOPY, FLEXIBLE, PROXIMAL TO SPLENIC FLEXURE; DIAGNOSTIC, W/WO COLLECTION SPECIMEN BY BRUSH OR WASH;  Surgeon: Liane Comber, MD;  Location: [facility]EDURES [place];  Service: Gastroenterology    PR COLSC FLX W/RMVL OF TUMOR POLYP LESION SNARE TQ N/A 10/20/2015    Procedure: COLONOSCOPY FLEX; W/REMOV TUMOR/LES BY SNARE;  Surgeon: Annie Paras, MD;  Location: [facility];  Service: Gastroenterology    PR EXCISION SUBMAXILLARY GLAND Right 03/31/2015    Procedure: EXC SUBMANDIBULAR GLAND;  Surgeon: Lauralee Evener, MD;  Location: ASC OR [facility];  Service: ENT    PR REMV UPPER JAW-MAXILLECTOMY Left 07/22/2019    Procedure: MAXILLECTOMY; WO ORBITAL EXENTERATION;  Surgeon: Adam Swaziland Kimple, MD;  Location: ASC OR [facility];  Service: ENT    PR STEREOTACTIC COMP ASSIST PROC,CRANIAL,EXTRADURAL Left 07/22/2019    Procedure: STEREOTACTIC COMPUTER-ASSISTED (NAVIGATIONAL) PROCEDURE; CRANIAL, EXTRADURAL;  Surgeon: Adam Swaziland Kimple, MD;  Location: ASC OR [facility];  Service: ENT    PR UPPER GI ENDOSCOPY,DIAGNOSIS N/A 08/18/2017    Procedure: UGI ENDO, INCLUDE ESOPHAGUS, STOMACH, & DUODENUM &/OR JEJUNUM; DX W/WO COLLECTION SPECIMN, BY BRUSH OR WASH;  Surgeon: Rona Ravens, MD;  [facility]: [facility];  Service: Gastroenterology    PR UPPER GI ENDOSCOPY,DIAGNOSIS N/A 11/08/2019    Procedure: UGI ENDO, INCLUDE ESOPHAGUS, STOMACH, & DUODENUM &/OR JEJUNUM; DX W/WO COLLECTION SPECIMN, BY BRUSH OR WASH;  Surgeon: Janyth Pupa, MD;  Location: [facility];  Service: Gastroenterology    SKIN BIOPSY           Current Facility-Administered Medications:     morphine injection 2 mg, 2 mg, Intravenous, Once, Jesse Fall, MD    Current Outpatient Medications:     albuterol HFA 90 mcg/actuation inhaler, Inhale 2 puffs every four (4) hours as needed for wheezing., Disp: 8 g, Rfl: 3    aspirin (ECOTRIN) 81 MG tablet, Take 1 tablet (81 mg total) by mouth daily., Disp: , Rfl:     atorvastatin (LIPITOR) 20 MG tablet, Take 1 tablet (20 mg total) by mouth nightly., Disp: 90 tablet, Rfl: 3    buprenorphine-naloxone (SUBOXONE) 8-2 mg sublingual film, Place 1 Film (8 mg of buprenorphine total) under the tongue Two (2) times a day for 28 days., Disp: 56 Film, Rfl: 0    cholecalciferol, vitamin D3-25 mcg, 1,000 unit,, 25 mcg (1,000 unit) capsule, Take 1 capsule (25 mcg total) by mouth daily., Disp: , Rfl:     lactulose 10 gram/15 mL solution, Take 15 mL (10 g total) by mouth Two (2) times a day., Disp: 900 mL, Rfl: 6    loratadine (CLARITIN) 10 mg tablet, Take 1 tablet (10 mg total) by mouth daily., Disp: , Rfl:     mirtazapine (REMERON) 15 MG tablet, Take 1  tablet (15 mg total) by mouth nightly., Disp: 90 tablet, Rfl: 3    naloxone (NARCAN) 4 mg nasal spray, One spray in either nostril once for known/suspected opioid overdose. May repeat every 2-3 minutes in alternating nostril til EMS arrives, Disp: 2 each, Rfl: PRN    oxyCODONE (ROXICODONE) 5 MG immediate release tablet, Take 1 tablet (5 mg total) by mouth every four (4) hours as needed for pain for up to 5 days., Disp: 10 tablet, Rfl: 0    rifAXIMin (XIFAXAN) 550 mg Tab, Take 1 tablet (550 mg total) by mouth Two (2) times a day., Disp: 60 tablet, Rfl: 5    sildenafiL (VIAGRA) 50 MG tablet, Take 1 tablet (50 mg total) by mouth daily as needed for erectile dysfunction., Disp: 30 tablet, Rfl: 1    tamsulosin (FLOMAX) 0.4 mg capsule, Take 1 capsule (0.4 mg total) by mouth two (2) times a day., Disp: 180 capsule, Rfl: 3    umeclidinium-vilanteroL (ANORO ELLIPTA) 62.5-25 mcg/actuation inhaler, Inhale 1 puff daily., Disp: 60 each, Rfl: 11    Allergies  Bicalutamide, Penicillins, and Rocephin [ceftriaxone]    Family History  Family History   Problem Relation Age of Onset    Heart attack Father     Hypertension Father     Lung cancer Mother     Bone cancer Mother     Hypertension Mother     Hepatitis Sister     Melanoma Neg Hx        Social History  Social History     Tobacco Use    Smoking status: Every Day     Packs/day: 1.00     Years: 50.00     Additional pack years: 0.00     Total pack years: 50.00     Types: Cigarettes     Start date: 06/16/1969    Smokeless tobacco: Former     Types: Chew     Quit date: 2009   Substance Use Topics    Alcohol use: No     Alcohol/week: 0.0 standard drinks of alcohol    Drug use: Not Currently     Types: Cocaine, IV, Marijuana     Comment: History of cocaine use years ago (intranasal, IV and crack), marijuana use.         Physical Exam     VITAL SIGNS:      Vitals:    05/11/22 0434   BP: 128/51   Pulse: 73   Resp: 22   Temp: 36.4 ??C (97.5 ??F)   TempSrc: Oral   SpO2: 100%   Weight: 46.3 kg (102 lb)   Height: 167.6 cm (5' 6)       Constitutional: Alert and oriented.  Uncomfortable appearing.  Eyes: Conjunctivae are normal.  HEENT: Normocephalic and atraumatic. Conjunctivae clear. No congestion. Moist mucous membranes.   Cardiovascular: Rate as above, regular rhythm. Normal and symmetric distal pulses. Brisk capillary refill. Normal skin turgor.  Respiratory: Normal respiratory effort. Breath sounds are normal. There are no wheezing or crackles heard.  Gastrointestinal: Soft, non-distended, non-tender.  Genitourinary: Deferred.  Musculoskeletal: Non-tender with normal range of motion in all extremities.  Neurologic: Normal speech and language. No gross focal neurologic deficits are appreciated. Patient is moving all extremities equally, face is symmetric at rest and with speech.  Skin: Skin is warm, dry and intact. No rash noted.  Psychiatric: Mood and affect are normal. Speech and behavior are normal.     Radiology     No  orders to display       Pertinent labs & imaging results that were available during my care of the patient were independently interpreted by me and considered in my medical decision making (see chart for details).    Portions of this record have been created using Scientist, clinical (histocompatibility and immunogenetics). Dictation errors have been sought, but may not have been identified and corrected.         Jesse Fall, MD  Resident  05/11/22 [phone]

## 2022-05-23 ENCOUNTER — Ambulatory Visit: Admit: 2022-05-23 | Discharge: 2022-05-24 | Payer: PRIVATE HEALTH INSURANCE

## 2022-05-23 DIAGNOSIS — K7460 Unspecified cirrhosis of liver: Principal | ICD-10-CM

## 2022-05-23 DIAGNOSIS — E785 Hyperlipidemia, unspecified: Principal | ICD-10-CM

## 2022-05-23 DIAGNOSIS — M7989 Other specified soft tissue disorders: Principal | ICD-10-CM

## 2022-05-23 LAB — BASIC METABOLIC PANEL
ANION GAP: 8 mmol/L (ref 5–14)
BLOOD UREA NITROGEN: 14 mg/dL (ref 9–23)
BUN / CREAT RATIO: 14
CALCIUM: 8.6 mg/dL — ABNORMAL LOW (ref 8.7–10.4)
CHLORIDE: 111 mmol/L — ABNORMAL HIGH (ref 98–107)
CO2: 24.1 mmol/L (ref 20.0–31.0)
CREATININE: 0.99 mg/dL
EGFR CKD-EPI (2021) MALE: 85 mL/min/{1.73_m2} (ref >=60)
GLUCOSE RANDOM: 85 mg/dL (ref 70–179)
POTASSIUM: 3.8 mmol/L (ref 3.4–4.8)
SODIUM: 143 mmol/L (ref 135–145)

## 2022-05-23 LAB — LIPID PANEL
CHOLESTEROL/HDL RATIO SCREEN: 2.5 (ref 1.0–4.5)
CHOLESTEROL: 127 mg/dL (ref <=200)
HDL CHOLESTEROL: 50 mg/dL (ref 40–60)
LDL CHOLESTEROL CALCULATED: 68 mg/dL (ref 40–99)
NON-HDL CHOLESTEROL: 77 mg/dL (ref 70–130)
TRIGLYCERIDES: 44 mg/dL (ref 0–150)
VLDL CHOLESTEROL CAL: 8.8 mg/dL — ABNORMAL LOW (ref 12–42)

## 2022-05-23 LAB — B-TYPE NATRIURETIC PEPTIDE: B-TYPE NATRIURETIC PEPTIDE: 32.07 pg/mL (ref <=100)

## 2022-05-23 LAB — MAGNESIUM: MAGNESIUM: 2 mg/dL (ref 1.6–2.6)

## 2022-05-23 MED ORDER — FUROSEMIDE 20 MG TABLET
ORAL_TABLET | Freq: Every day | ORAL | 6 refills | 30 days | Status: CP
Start: 2022-05-23 — End: 2022-05-23

## 2022-05-23 MED ORDER — ATORVASTATIN 20 MG TABLET
ORAL_TABLET | Freq: Every evening | ORAL | 3 refills | 90 days | Status: CP
Start: 2022-05-23 — End: ?

## 2022-05-23 NOTE — Unmapped (Signed)
DIVISION OF CARDIOLOGY  [facility] [place]        Date of Service: 05/23/2022    New Patient Clinic Note    PCP: Referring Provider:   Roma Schanz, MD  [address]  [place] [address]  Phone: [phone]  Fax: [phone] Harold Richards  [address]  [place],  [place] [address]  Phone: [phone]  Fax: [phone]     Assessment and Plan:      Harold Richards is a 66 y.o. M with a h/o cirrhosis d/t HCV s/p Harvonia (completed 2016), prior drug abuse, tobacco use who is referred to cardiology for evaluation of leg swelling.     # Leg Swelling  Patient has endorses lower extremity swelling that has been intermittent over the past few months. He states no specific triggers and happens at random. Goes away as well without specific intervention. Echocardiogram recently done which showed normal ejection fraction/no evidence of diastolic dysfunction but elevated right atrial pressure, possibly related to underlying liver disease. We will start him on lasix as needed for lower extremity swelling and check a BNP.   -Lasix 20 mg PRN for leg swelling  -BNP, BMP, Mg    # Sinus Bradycardia   The patient has longstanding known sinus bradycardia but has no documented history of AV block (high grade or other). He does NOT have chronotropic incompetence as evidence by an ETT completed in 10/2016 at which time he was able to achieve > 85% age predicted maximum heart rate. Echo showed no abnormalities. Zipatch showed no significant bradycardia/AV block.     # SVT noted on ziopatch  37 episodes of short lived SVT on monitor. Asymptomatic. Will hold on beta blockade because of lack of symptoms and bradycardia.     #Significant coronary calcium noted on CT chest   - Refill atorvastatin 20 mg daily (has ran out recently)  -Recheck lipid panel  - Continue aspirin 81 mg daily    Return in about 6 months (around 11/22/2022).    Orders Placed This Encounter   Procedures    Lipid panel (non-fasting)    B-type Natriuretic Peptide    Basic Metabolic Panel    Magnesium Level    ECG 12 Lead       The patient was seen and discussed with Dr. Laurice Richards.    Subjective:        Reason for Visit: Leg swelling    History of Present Illness: Harold Richards is a 66 y.o. M with a h/o cirrhosis d/t HCV s/p Harvonia (completed 2016), prior drug abuse, tobacco use who is referred to cardiology for evaluation of leg swelling.     Previously, he was seen in clinic last in 2020 for evaluation of bradycardia.To his knowledge he has always had a bit of a low heart rate. 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 He has no major limitation in these activities. He never does formal exercise, but does feel short of breath when he walks up a big incline or does heavy labor. He never has chest pain. He underwent a stress test in 10/2016 which was normal; this was completed because he was noted to have atherosclerosis on CT scan.  He was referred to cardiology in 11/2017 for sinus bradycardia. He wore a ziopatch which showed no significant bradycardia and a few episodes of SVT.     He was last seen in clinic in 09/2018 and  was doing well at that time aside from DOE and unexplained weight loss. He was subsequently diagnosed with prostate cancer. He also was found to have significant coronary calcium so was started on ASA/atorvastatin.     More recently, he wanted to establish care with Cardiology again due to the development of lower extremity swelling. This swelling been intermittent over the past few months. He states no specific triggers and happens at random. Goes away as well without specific intervention. Echocardiogram recently done which showed normal ejection fraction/no evidence of diastolic dysfunction but elevated right atrial pressure, possibly related to underlying liver disease. He still smokes around 1 pack per day, denies alcohol/other ilicit drug use, on suboxone.     Denies chest pain with exertion, reports some shortness of breath when he exerts himself as well as trouble with laying down flat at night 2/2 orthopnea.     Social- Married   - [employer]  - smokes tobacco- 50 + pack years.   - Denies alcohol or alcohol.     Family History  - Father- surgery    Cardiovascular History:  None    Cardiovascular Studies Date Comments     ECG 05/2022 NSR, RBBB   Echo 12/13/17 Interpretation Summary   Normal left ventricular systolic function, ejection fraction 60 to 65%  Dilated left atrium - mild  Normal right ventricular systolic function  Tricuspid regurgitation - mild  Mildly elevated right atrial pressure      Stress test 10/2016 ETT- Interpretation Summary   Overall, the patient's exercise capacity was good.  No significant chest pain symptoms reported  No significant ST segment changes or arrhythmias were noted during stress  Negative ETT for ischemia at workload achieved      Cardiac catheterization     [id number] Genotype     Electrophysiology  11/17/17 Conclusion:  1 run of non-sustained Ventricular Tachycardia occurred lasting 6 beats with a max rate of 112 bpm (avg 93 bpm).  37 Supraventricular Tachycardia runs occurred, most consistent with paroxysmal atrial tachycardia; the run with the fastest interval lasting 13 beats with a max rate of 207 bpm, the longest lasting 8 beats with an avg rate of 104 bpm.    Cardiovascular Surgery     Peripheral Vascular Studies     Anginal Equivalent       Relevant Past Medical History:    Patient's Medications   New Prescriptions    No medications on file   Previous Medications    ALBUTEROL HFA 90 MCG/ACTUATION INHALER    Inhale 2 puffs every four (4) hours as needed for wheezing.    ASPIRIN (ECOTRIN) 81 MG TABLET    Take 1 tablet (81 mg total) by mouth daily.    ATORVASTATIN (LIPITOR) 20 MG TABLET    Take 1 tablet (20 mg total) by mouth nightly.    BUPRENORPHINE-NALOXONE (SUBOXONE) 8-2 MG SUBLINGUAL FILM    Place 1 Film (8 mg of buprenorphine total) under the tongue Two (2) times a day for 28 days.    CHOLECALCIFEROL, VITAMIN D3-25 MCG, 1,000 UNIT,, 25 MCG (1,000 UNIT) CAPSULE    Take 1 capsule (25 mcg total) by mouth daily.    LACTULOSE 10 GRAM/15 ML SOLUTION    Take 15 mL (10 g total) by mouth Two (2) times a day.    LORATADINE (CLARITIN) 10 MG TABLET    Take 1 tablet (10 mg total) by mouth daily.    MIRTAZAPINE (REMERON) 15 MG TABLET    Take 1  tablet (15 mg total) by mouth nightly.    NALOXONE (NARCAN) 4 MG NASAL SPRAY    One spray in either nostril once for known/suspected opioid overdose. May repeat every 2-3 minutes in alternating nostril til EMS arrives    RIFAXIMIN (XIFAXAN) 550 MG TAB    Take 1 tablet (550 mg total) by mouth Two (2) times a day.    SILDENAFIL (VIAGRA) 50 MG TABLET    Take 1 tablet (50 mg total) by mouth daily as needed for erectile dysfunction.    TAMSULOSIN (FLOMAX) 0.4 MG CAPSULE    Take 1 capsule (0.4 mg total) by mouth two (2) times a day.    UMECLIDINIUM-VILANTEROL (ANORO ELLIPTA) 62.5-25 MCG/ACTUATION INHALER    Inhale 1 puff daily.   Modified Medications    No medications on file   Discontinued Medications    No medications on file       Allergies:  Allergies   Allergen Reactions    Bicalutamide Rash    Penicillins Rash    Rocephin [Ceftriaxone] Rash     Patient received in Emergency Room and reported rash to stomach and back several hours later.        Social History:  He  reports that he has been smoking cigarettes. He started smoking about 52 years ago. He has a 50.00 pack-year smoking history. He quit smokeless tobacco use about 14 years ago.  His smokeless tobacco use included chew. He reports that he does not currently use drugs after having used the following drugs: Cocaine, IV, and Marijuana. He reports that he does not drink alcohol.    Family History:  His family history includes Bone cancer in his mother; Heart attack in his father; Hepatitis in his sister; Hypertension in his father and mother; Lung cancer in his mother.    Review of Systems  10 systems were reviewed and negative except as noted in HPI.      Objective:       Physical Exam  BP 122/50  - Pulse 63  - Ht 167.6 cm (5' 6)  - Wt 50.1 kg (110 lb 6.4 oz)  - SpO2 95%  - BMI 17.82 kg/m??    Wt Readings from Last 3 Encounters:   05/11/22 46.3 kg (102 lb)   05/10/22 46.3 kg (102 lb)   04/05/22 51.2 kg (112 lb 12.8 oz)       General:  Alert, no distress. Very thin.   HEENT:  EOMI, sclerae anicteric, MMM.   Neck: Supple, no carotid bruit. JVP not visible at the clavicle   Lungs:   CTAB bilaterally with normal WOB.   Heart:  RRR. No m/r/g.    Abdomen:   Soft, non-tender, non-distended.   Extremities: 2+ radial, PT and DP pulses bilaterally. No edema bilaterally.   Skin: No lesions/rashes.   Neurologic: No focal deficits.     Most recent labs     Lab Results   Component Value Date    WBC 9.2 05/11/2022    RBC 3.54 (L) 05/11/2022    HGB 12.5 (L) 05/11/2022    HCT 36.6 (L) 05/11/2022    MCV 103.5 (H) 05/11/2022    MCH 35.2 (H) 05/11/2022    MCHC 34.0 05/11/2022    RDW 13.4 05/11/2022    PLT 169 05/11/2022    MPV 6.4 (L) 05/11/2022       Lab Results   Component Value Date    NA 142 05/11/2022    K 4.0 05/11/2022  CL 112 (H) 05/11/2022    CO2 20.9 05/11/2022    BUN 18 05/11/2022    CREATININE 1.48 (H) 05/11/2022    GFR >= 60 02/28/2012    GLU 135 05/11/2022    CALCIUM 8.7 05/11/2022    ALBUMIN 3.7 05/11/2022       Lab Results   Component Value Date    CHOL 138 08/16/2016    CHOL 113 02/08/2013     Lab Results   Component Value Date    HDL 41 08/16/2016    HDL 30 (L) 02/08/2013     Lab Results   Component Value Date    LDL 80 08/16/2016     Lab Results   Component Value Date    TRIG 87 08/16/2016    TRIG 65 02/08/2013     No components found for: [facility]    Lab Results   Component Value Date    PT 14.8 (H) 03/16/2022    INR 1.30 03/16/2022              Durward Parcel, MD  Fellow, Cardiovascular Medicine PGY-6  05/23/22

## 2022-05-23 NOTE — Unmapped (Addendum)
We recommend taking a water pill as needed for leg swelling. We will check some bloodwork regarding your kidney function, cholesterol, and fluid level in your heart.

## 2022-05-23 NOTE — Unmapped (Signed)
Attending Physician Attestation:      I confirm that I discussed the patient, reviewed all the relevant lab and diagnostic data, and we agreed upon a plan of treatment. I agree with the findings, assessment, and plan as documented by Dr. Sharol Given.    Kennis Carina, MD

## 2022-05-31 ENCOUNTER — Ambulatory Visit: Admit: 2022-05-31 | Discharge: 2022-06-01 | Payer: PRIVATE HEALTH INSURANCE

## 2022-05-31 DIAGNOSIS — Z23 Encounter for immunization: Principal | ICD-10-CM

## 2022-05-31 DIAGNOSIS — F1111 Opioid abuse, in remission: Principal | ICD-10-CM

## 2022-05-31 DIAGNOSIS — N200 Calculus of kidney: Principal | ICD-10-CM

## 2022-05-31 DIAGNOSIS — R1030 Lower abdominal pain, unspecified: Principal | ICD-10-CM

## 2022-05-31 LAB — URINALYSIS WITH MICROSCOPY WITH CULTURE REFLEX
BILIRUBIN UA: NEGATIVE
BLOOD UA: NEGATIVE
GLUCOSE UA: NEGATIVE
GRANULAR CASTS: <1 /LPF — ABNORMAL HIGH (ref <=0)
HYALINE CASTS: 15 /LPF — ABNORMAL HIGH (ref <=0)
LEUKOCYTE ESTERASE UA: NEGATIVE
NITRITE UA: NEGATIVE
PH UA: 5 (ref 5.0–9.0)
PROTEIN UA: NEGATIVE
RBC UA: <1 /HPF (ref 0–3)
SPECIFIC GRAVITY UA: >=1.03 (ref 1.005–1.030)
SQUAMOUS EPITHELIAL: <1 /HPF (ref 0–5)
UROBILINOGEN UA: 0.2
WBC UA: 1 /HPF (ref 0–3)

## 2022-05-31 LAB — TOXICOLOGY SCREEN, URINE
AMPHETAMINE SCREEN URINE: NEGATIVE
BARBITURATE SCREEN URINE: NEGATIVE
BENZODIAZEPINE SCREEN, URINE: NEGATIVE
BUPRENORPHINE, URINE SCREEN: POSITIVE — AB
CANNABINOID SCREEN URINE: NEGATIVE
COCAINE(METAB.)SCREEN, URINE: NEGATIVE
FENTANYL SCREEN, URINE: NEGATIVE
METHADONE SCREEN, URINE: NEGATIVE
OPIATE SCREEN URINE: NEGATIVE
OXYCODONE SCREEN URINE: NEGATIVE

## 2022-05-31 NOTE — Unmapped (Signed)
Harold Richards [facility]  Harold Richards Visit    Reason for visit: kidney stones, MOUD follow-up    A/P:    1. Recurrent nephrolithiasis    2. Opioid use disorder, mild, in sustained remission (CMS-HCC)    3. Need for vaccination    4. Lower abdominal pain        1. Recurrent nephrolithiasis  CT with 2mm stone on the left correlating with location of his pain when he presented to the ED.  However CT also notable for greater hydronephrosis on the left.  Currently now asymptomatic. He was not able to collect a stone using a strainer, so not able to submit for stone analysis. Last symptomatic acute nephrolithiasis was 11/2020.  - provided info on dietary changes to make to prevent kidney stones.  - Ambulatory referral to Urology; Future  - Urinalysis with Microscopy with Culture Reflex    Lower abdominal pain, bilateral - Mesenteric stranding on CT  Not clear that this is related to the nephrolithiasis, and seems to be a separate issue. Will continue to monitior.  At next visit if persists, will repeat CT.    2. Opioid use disorder, mild, in sustained remission (CMS-HCC)  Remains in remission  - Toxicology Screen, Urine  - buprenorphine-naloxone (SUBOXONE) 8-2 mg sublingual film; Place 1 Film (8 mg of buprenorphine total) under the tongue two (2) times a day for 28 days.  Dispense: 56 Film; Refill: 0 - provided 2 prescriptions for next two months (through 07/28/19)  Patient IMCMATSTABILITY: is stable on maintenance therapy [email or web address]: 2 month follow up, next visit in person    PRESCRIPTIONS GIVEN TODAY:    Medications ordered during this encounter   Medications    buprenorphine-naloxone (SUBOXONE) 8-2 mg sublingual film     Sig: Place 1 Film (8 mg of buprenorphine total) under the tongue two (2) times a day for 28 days.     Dispense:  56 Film     Refill:  0     NADEAN:[id number]    buprenorphine-naloxone (SUBOXONE) 8-2 mg sublingual film     Sig: Place 1 Film (8 mg of buprenorphine total) under the tongue two (2) times a day for 28 days.     Dispense:  56 Film     Refill:  0     ZOXWRU:[id number]      - Benefits of treatment outweigh the risks  - Utox today: will be positive for:  - PDMP reviewed: Appropriate on review today  - Next appt: 12/19/20201   - Substance Abuse Counseling:  not current enrolled   - Does pt have naloxone at home and know how to use it?  yes  - Labs needed today? no    No results found for: HIV12AB, HIVAGAB  Lab Results   Component Value Date    HEPAIGG Nonreactive 07/22/2015    HEPBCAB Reactive (A) 07/22/2015    HEPCAB Positive (A) 03/05/2014     Lab Results   Component Value Date    ALKPHOS 52 05/11/2022    BILITOT <0.2 (L) 05/11/2022    BILIDIR <0.10 12/23/2020    PROT 6.5 05/11/2022    ALBUMIN 3.7 05/11/2022    ALT 14 05/11/2022    AST 26 05/11/2022     No results found for: PREGTESTUR, PREGSERUM, HCG, HCGQUANT    - Suboxone treatment agreement:  has been signed by patient and scanned into record      3. Need for vaccination  - COVID-19  VAC, (28YR+) (SPIKEVAX) MONOVALENT XBB.1.5 MODERNA   - INFLUENZA VACCINE (QUAD) IM - 6 MO-ADULT - PF      Return in 8 weeks (on 07/26/2022) for In-person earliest available that morning.        __________________________________________________________    HPI:    Follow-up for ED visits for kidney stone.  Had subsequent visit because of the pain.  Was prescribed oxycodone for pain, used a couple, still has more at home.  Used strainer and never saw stone. Left sided flank pain is now gone.   No issues with urination. No fevers, nausea or vomiting.   Doing well on current suboxone treatment.  No disruptions even with use of oxycodone for acute pain.  __________________________________________________________        Medications:  Reviewed in EPIC  __________________________________________________________    Physical Exam:   Vital Signs:  Vitals:    05/31/22 0803   BP: 127/57   Pulse: 76   Temp: 36.4 ??C (97.5 ??F)   TempSrc: Temporal   SpO2: 98%   Weight: 50.6 kg (111 lb 9.6 oz)   Height: 167.6 cm (5' 6)          PTHomeBP    Gen: Well appearing, NAD  CV: RRR, no murmurs  Pulm: CTA bilaterally, no crackles or wheezes  Abd: Soft, ND, mild tenderness to palpation in bilateral lower abdomen        PHQ-9 Score:     GAD-7 Score:       Medication adherence and barriers to the treatment plan have been addressed. Opportunities to optimize healthy behaviors have been discussed. Patient / caregiver voiced understanding.    -2 mm calcification left UVJ with left perinephric stranding and moderate left hydroureteronephrosis (greater than what was seen on 11/2020). Since prior similar process described on 11/2020, there has been interval resolution of hydronephrosis (as seen on subsequent MRIs 09/09/2021 and 10/24/2021). This raises possibility of recurrent obstruction from small stone, which may be facilitated by scarring from radiation changes.

## 2022-05-31 NOTE — Unmapped (Signed)
 Internal Medicine at [facility]     Type of visit: face to face    Are you located in [place]? (for virtual visits only) N/A    Reason for visit: Follow up    Screening BP- 127/57      HCDM reviewed and updated in Epic:    We are working to make sure all of our patients??? wishes are updated in Epic and part of that is documenting a Environmental health practitioner for each patient  A Health Care Decision Maker is someone you choose who can make health care decisions for you if you are not able - who would you most want to do this for you????  is already up to date.    HCDM (patient stated preference): Pamala Duffel - Spouse - [phone]    HCDM, First Alternate: Katherina Right - Daughter - [phone]    HCDM, Second Alternate: Joaquin Bend - Daughter [phone]    BPAs completed:  Influenza vaccine and Moderna Bivalent    COVID-19 Vaccine Summary  Which COVID-19 Vaccine was administered  Moderna  Type:  Dates Given:  06/03/2021                   If no: Are you interested in scheduling? Wants vaccine - eligible now    Immunization History   Administered Date(s) Administered    COVID-19 VAC,BIVALENT,MODERNA(BLUE CAP) 06/03/2021    COVID-19 VACCINE,MRNA(MODERNA)(PF) 10/07/2019, 11/05/2019, 06/21/2020, 12/01/2020    Hepatitis A (Adult) 07/28/2016, 01/26/2017    Hepatitis A Vaccine Pediatric / Adolescent 2 Dose IM 03/05/2014    Influenza Vaccine Quad (IIV4 PF) 59mo+ injectable 06/02/2014, 06/16/2016, 04/19/2019    Influenza Vaccine Quad (IIV4 W/PRESERV) 33MO+ 06/01/2017, 05/12/2021    Influenza Virus Vaccine, unspecified formulation 05/18/2017, 05/22/2018, 05/24/2018, 05/07/2020, 05/25/2020    PNEUMOCOCCAL POLYSACCHARIDE 23 03/05/2014    TdaP 04/19/2019       __________________________________________________________________________________________

## 2022-05-31 NOTE — Unmapped (Addendum)
You will get a call from Urology to schedule an appt about your kidney stones.     [facility]: Kidney Stone Prevention     Kidney stones can form due to one or all of the following:     1. Low fluid Intake   2. Low citrate in the urine   3. Too much salt, oxalate, and animal protein     The following dietary changes may decrease your risk of forming stones.     1. Increase your fluid intake to around 2.5 - 3 liters per day. Diluting the urine hinders the formation of stones. You should drink enough fluid to make 2 liters of urine a day. You know you are drinking enough fluid if your urine is clear.     2. Decrease your salt intake to less than 2000mg /day   This will decrease the amount of calcium excreted in your urine. Eat more fresh or frozen vegetables instead of canned. Eat less processed meats. At restaurants request your food to be prepared without salt or high sodium seasoning.     3. Limit the amount of Oxalate containing foods in your diet to 50mg  per day.   This will decrease the amount of oxalate excreted in your urine.     Nuts   Peanut Butter   Chocolate   Spinach   Rhubarb   Tea   Black Pepper   Strawberries   Tofu     4. Limit the amount of animal proteins in your diet to less than 15 oz per day.   This will decrease the amount of uric acid excreted in your urine.     Fish   Liver   Chicken   Red Meat - no more than 3 servings per week     5. Increase the amount of citrate in your diet.   This will decrease the amount of calcium in your urine. 1/2 cup concentrated lemon juice to 2 quarts of water. Avoid sweetened products as these may negate the benefit.     6. Eat the recommended daily allowance of calcium each day.  Calcium in your diet binds oxalate in your gut and decreases the amount excreted in your urine. Oxalate is often one of the components of kidney stones. Even though you may form calcium kidney stones decreasing the amount of calcium in your diet will lead to higher amounts of oxalate in your urine and potentially increase your risk of recurrent stone disease.         [facility]  [address]  [place], [address]  Phone: [phone]  Toll Free: [phone]  Fax: [phone]    Contacts for urgent needs:    During Business Hours:   Same Day Clinic:  sick today and need an appointment, call [phone], ask for an appointment in the Same Day Clinic, which is located on the 5th floor at [address] [place] [address]  Nurse advice line:  call main clinic number [phone], option to speak with nurse.  Voicemails are check throughout the day and messages left by 4:30pm will be responded to that day.  After 4:30pm, messages are returned the next business day.      After Hours, Weekend, or Holidays:  Call the [employer] [facility] 24/7 Nursing Line [phone] to get nurse advice.  Go to [facility] ([facility]) walk-in clinic at [address], [address], [place], [place]; [phone]; 7 days a week from 9:00AM - 8:00PM.  Go to [facility] at [facility] at [address], [address], [place], [place] [address]; [phone]; 7 days a week from 8:00PM - 8:00PM  Go to [facility] at [facility] at [address], [place], [place]; [phone]; Mon-Fri 8:00AM-7:00PM, Sat-Sun 12:00PM-5:00PM  Go to [facility] at [facility] at [address] [place], [place] [address], [phone], 7 days a week from 8:00AM - 8:00PM  Go to [facility] Care for sprains and strains, joint pain, sports injuries and possible fractures at [address], [place], [place]; [phone]; Mon-Thurs 8:00AM-7:00PM, Fri 8:00AM-5:00PM    [facility] Ur[facility] website with full listings:  [email or web address]     Scheduling appointments Online  [facility] MyChart secure website and app makes it easy for you to schedule appointments. You can schedule most primary care clinic appointments online with providers you have seen before. Log in to [email or web address] and select Visits to schedule an appointment today

## 2022-06-01 MED ORDER — BUPRENORPHINE 8 MG-NALOXONE 2 MG SUBLINGUAL FILM
ORAL_FILM | Freq: Two times a day (BID) | SUBLINGUAL | 0 refills | 28 days | Status: CP
Start: 2022-06-01 — End: 2022-06-29

## 2022-06-01 NOTE — Unmapped (Signed)
[facility] Refill Coordination Note    Specialty Medication(s) to be Shipped:   Infectious Disease: Xifaxan    Other medication(s) to be shipped: No additional medications requested for fill at this time     Harold Richards, DOB: Sep 13, 1955  Phone: [phone] (home)       All above HIPAA information was verified with patient's spouse     Was a translator used for this call? No    Completed refill call assessment today to schedule patient's medication shipment from the [facility] [phone]).  All relevant notes have been reviewed.     Specialty medication(s) and dose(s) confirmed: Regimen is correct and unchanged.   Changes to medications: [name] reports no changes at this time.  Changes to insurance: No  New side effects reported not previously addressed with a pharmacist or physician: None reported  Questions for the pharmacist: No    Confirmed patient received a Conservation officer, historic buildings and a Surveyor, mining with first shipment. The patient will receive a drug information handout for each medication shipped and additional FDA Medication Guides as required.       DISEASE/MEDICATION-SPECIFIC INFORMATION        N/A    SPECIALTY MEDICATION ADHERENCE     Medication Adherence    Patient reported X missed doses in the last month: 0  Specialty Medication: xifaxan 550mg   Patient is on additional specialty medications: No  Informant: spouse                          Were doses missed due to medication being on hold? No    Xifaxan 550 mg: patient's spouse was not able to confirm quantity of medicine on hand       REFERRAL TO PHARMACIST     Referral to the pharmacist: Not needed      [facility]     Shipping address confirmed in Epic.     Delivery Scheduled: Yes, Expected medication delivery date: 06/03/22.     Medication will be delivered via Same Day Courier to the prescription address in Epic WAM.    Jasper Loser   [facility] Specialty Technician

## 2022-06-03 NOTE — Unmapped (Signed)
Harold Richards 's xifaxan shipment will be delayed as a result of the medication is too soon to refill until 10/28.     I have reached out to the patient  at [phone] and communicated the delivery change. We will reschedule the medication for the delivery date that the patient agreed upon.  We have confirmed the delivery date as 10/30, via same day courier.

## 2022-06-06 MED FILL — XIFAXAN 550 MG TABLET: ORAL | 30 days supply | Qty: 60 | Fill #1

## 2022-06-28 ENCOUNTER — Ambulatory Visit: Admit: 2022-06-28 | Payer: PRIVATE HEALTH INSURANCE | Attending: Radiation Oncology | Primary: Radiation Oncology

## 2022-06-28 ENCOUNTER — Ambulatory Visit: Admit: 2022-06-28 | Discharge: 2022-06-29 | Payer: PRIVATE HEALTH INSURANCE

## 2022-06-28 DIAGNOSIS — C61 Malignant neoplasm of prostate: Principal | ICD-10-CM

## 2022-06-28 LAB — PSA: PROSTATE SPECIFIC ANTIGEN: <0.04 ng/mL (ref 0.00–4.00)

## 2022-06-28 MED ORDER — SILDENAFIL 100 MG TABLET
ORAL_TABLET | Freq: Every day | ORAL | 2 refills | 30 days | Status: CP | PRN
Start: 2022-06-28 — End: 2023-06-28

## 2022-06-28 NOTE — Unmapped (Signed)
No diarrhea or loose stool. Occasional urgency. No pain or tenderness, bleeding or cramping. Occasional mucus past from rectum. No bowel urgency and not able to have BM.    Urinary flow fairly easy. Urinates >3 times nights and 9-12 during the day. No pain or burning. Occasional urgency. Occasional leakage.

## 2022-06-29 NOTE — Unmapped (Signed)
RADIATION ONCOLOGY FOLLOW-UP VISIT NOTE     Encounter Date: 06/28/2022  Patient Name: Harold Richards  Medical Record Number: [id number]    DIAGNOSIS:  66 y.o. male with high risk prostate cancer, cT1c, Gleason 4+4=8 in 2/12 cores, PSA 4.59 s/p external beam RT (45Gy) with brachytherapy boost (brachy done 11/05/2018)    DURATION SINCE COMPLETION OF RADIOTHERAPY:  3 years, 7 months (brachy on 11/05/2018)    Lupron #1 (3 month injection) 07/17/2018  Lupron #2 (3 month injection) 10/16/2018  Lupron #3 (3 month injection) 01/16/2019  Lupron #4 (1 month injection) 04/18/2019  Eligard #5 (3 month injection) 05/29/2019  Eligard #6 (3 month injection) 08/28/2019  Eligard #7 (3 month injection) 11/27/2019    ASSESSMENT:  Disease Status: No Evidence of Disease (NED).  PSA today  <0.04    RECOMMENDATIONS:  1. PSA today is <0.04- no evidence of disease  2. ADT:  Completed- last injection 11/27/2019  3. Hot flashes:  Resolved- suspect his testosterone has recovered  4. GU: Doing fine on flomax once daily. No further issues with hematuria (had small amount of hematuria in early 2022 but cystoscopy was negative for bladder changes/masses- may be related to kidney stones)  5. GI:  No issues since RT- has some baseline issues related to lactulose use. Colonoscopy last done 08/2021.  6. Weight loss:  Improved- doing fine now.  7. FOLLOW-UP:  He will return in 6 months for routine follow-up and PSA check    INTERVAL HISTORY:    Feeling well today overall.  He restarted flomax once daily after our last visit and is fine with his urination as is- good stream, mild daytime frequency, rare urgency, no dysuria.  Nocturia 3x but depends on what he drinks leading up to bed.  No new bowel issues- baseline has some urgency and occasional loose stool related to lactulose use.  No hot flashes.  Enjoying retirement- spending lots of time with his grandkids.    Baseline  General: Very fatigued. No ED  Urinary: On flomax, post-void residual 0 cc. Has frequency, nocturia 2-3x/night, no urgency, hematuria, dysuria.   GI: Denies loose stools, rectal pain, or bleeding.   Colonoscopy: Done 10/2015, found polyp.  Repeat in 5 years.    REVIEW OF SYSTEMS:  A comprehensive review of 10 systems was negative except for pertinent positives noted in HPI.    PAST MEDICAL HISTORY/FAMILY HISTORY/SOCIAL HISTORY:  Reviewed in EPIC    ALLERGIES/MEDICATIONS:  Reviewed in EPIC    PHYSICAL EXAM:  Vital Signs for this encounter:   BP 119/47  - Pulse 60  - Temp 36.7 ??C (98.1 ??F) (Temporal)  - Resp 17  - Wt 52.3 kg (115 lb 3.2 oz)  - SpO2 98%  - BMI 18.59 kg/m??   Karnofsky/Lansky Performance Status: 90,  Able to carry on normal activity; minor signs or symptoms of disease (ECOG equivalent 0)  General:   No acute distress, alert and oriented X 4   Head: Normocephalic, without obvious abnormality, atraumatic  Eyes: EOMI, no scleral icterus  Lungs: Normal work of breathing  Abdomen: non-distended  Extremities: extremities normal, atraumatic, no cyanosis or edema  Lymph nodes: No palpable supraclavicular or cervical lymphadenopathy  Neurologic: Grossly normal   Rectal: Deferred      RADIOLOGY:  No new imaging to review    Labs:    PSA   Date Value Ref Range Status   06/28/2022 <0.04 0.00 - 4.00 ng/mL Final   12/23/2021 <0.04 0.00 - 4.00  ng/mL Final   11/05/2021 0.06 0.00 - 4.00 ng/mL Final   06/23/2021 <0.04 0.00 - 4.00 ng/mL Final   03/23/2021 <0.04 0.00 - 4.00 ng/mL Final   12/15/2020 <0.04 0.00 - 4.00 ng/mL Final   09/15/2020 <0.04 0.00 - 4.00 ng/mL Final   05/28/2020 <0.04 0.00 - 4.00 ng/mL Final   02/27/2020 <0.04 0.00 - 4.00 ng/mL Final   08/28/2019 <0.10 0.00 - 4.00 ng/mL Final   01/16/2019 <0.10 0.00 - 4.00 ng/mL Final   05/18/2018 4.59 (H) 0.00 - 4.00 ng/mL Final   10/26/2017 3.88 0.00 - 4.00 ng/mL Final   07/27/2017 4.15 (H) 0.00 - 4.00 ng/mL Final       Rayetta Humphrey, MD  Assistant Professor  [facility] Dept of Radiation Oncology  06/28/2022

## 2022-06-30 MED ORDER — BUPRENORPHINE 8 MG-NALOXONE 2 MG SUBLINGUAL FILM
ORAL_FILM | Freq: Two times a day (BID) | SUBLINGUAL | 0 refills | 28 days | Status: CP
Start: 2022-06-30 — End: 2022-07-28

## 2022-07-08 NOTE — Unmapped (Signed)
[facility] Pharmacy Refill Coordination Note    Specialty Medication(s) to be Shipped:   Infectious Disease: Xifaxan    Other medication(s) to be shipped: No additional medications requested for fill at this time     Harold Richards, DOB: 1956/03/18  Phone: [phone] (home)       All above HIPAA information was verified with patient.     Was a Nurse, learning disability used for this call? No    Completed refill call assessment today to schedule patient's medication shipment from the [facility] [phone]).  All relevant notes have been reviewed.     Specialty medication(s) and dose(s) confirmed: Regimen is correct and unchanged.   Changes to medications: Harold Richards reports no changes at this time.  Changes to insurance: No  New side effects reported not previously addressed with a pharmacist or physician: None reported  Questions for the pharmacist: No    Confirmed patient received a Conservation officer, historic buildings and a Surveyor, mining with first shipment. The patient will receive a drug information handout for each medication shipped and additional FDA Medication Guides as required.       DISEASE/MEDICATION-SPECIFIC INFORMATION        N/A    SPECIALTY MEDICATION ADHERENCE     Medication Adherence    Patient reported X missed doses in the last month: 0  Specialty Medication: xifaxan 550mg   Patient is on additional specialty medications: No  Patient is on more than two specialty medications: No  Any gaps in refill history greater than 2 weeks in the last 3 months: no  Demonstrates understanding of importance of adherence: yes  Informant: patient  Reliability of informant: reliable  Provider-estimated medication adherence level: good  Patient is at risk for Non-Adherence: No  Reasons for non-adherence: no problems identified                                Were doses missed due to medication being on hold? No    XIFAXAN 550 mg Tab (rifAXIMin)  : 3 days of medicine on hand       REFERRAL TO PHARMACIST Referral to the pharmacist: Not needed      [facility]     Shipping address confirmed in Epic.     Delivery Scheduled: Yes, Expected medication delivery date: 07/11/22.     Medication will be delivered via Same Day Courier to the prescription address in Epic WAM.    Harold Richards' [name] Specialty Technician

## 2022-07-11 ENCOUNTER — Ambulatory Visit: Admit: 2022-07-11 | Discharge: 2022-07-12 | Payer: PRIVATE HEALTH INSURANCE

## 2022-07-11 DIAGNOSIS — N200 Calculus of kidney: Principal | ICD-10-CM

## 2022-07-11 DIAGNOSIS — N135 Crossing vessel and stricture of ureter without hydronephrosis: Principal | ICD-10-CM

## 2022-07-11 MED FILL — XIFAXAN 550 MG TABLET: ORAL | 30 days supply | Qty: 60 | Fill #2

## 2022-07-11 NOTE — Unmapped (Signed)
Assessment:    66yM with left distal ureteral stone and hydronephrosis    Plan:    Discussed options of left ureteroscopy to eval stone and possible stricture vs obtaining RUS first.  If no hydro, then observation.  If hydro, then ureteroscopy.  I suspect there is some component of ureteral stricture, given prior imaging showing small stone and mild hydro previously.  Will start with RUS and call with results.    Referring Physician:   Roma Schanz, MD  [address]  [address]  [place],  [place] [address]    PCP:   Roma Schanz, MD    Chief Complaint   Patient presents with    Urinary Urgency       Subjective:    HPI:   66 y.o. male seen in consultation at the request of Gladman, Wilhemena Durie,* for evaluation. The patient has had concerns including Urinary Urgency.    Seen for evaluation of nephrolithiasis.  Never had prior procedures for stones.  Does not recall passing stone (was using strainer).  Presented to ED 05/2022 with intense left flank pain.  Seen in ED 05/2022. CT showed distal left ureteral stone.  Pain resolved after few days.  No gross hematuria.    Prostate ca s/p brachytherapy 2020.  PSA undetectable.  Prior hematuria evaluation 2022 - negative.    No anticoagulants.    CT 05/2022 reviewed - right kidney normal, left kidney with mod hydro, 2mm left UVJ stone.  CT 11/2020 with similar 2mm stone and mild hydro  MRI 03/2021 - no hydro    PMH:  Past Medical History:   Diagnosis Date    Actinic keratosis     Asthma     Basal cell carcinoma     Chronic headaches     Colon polyp     Enlarged prostate     GERD (gastroesophageal reflux disease) 02/08/2013    Hepatitis C     Hx of substance abuse (CMS-HCC)     hx of recreational drug use (cocaine IV, intranasal and crack),     Infectious viral hepatitis     Lung nodule seen on imaging study 07/12/2016    Osteoporosis     Other emphysema (CMS-HCC)     Renal calculus     Skin cancer     Tobacco abuse 02/08/2013       PSH:  Past Surgical History: Procedure Laterality Date    CERVICAL FUSION      cage    PR COLONOSCOPY FLX DX W/COLLJ SPEC WHEN PFRMD N/A 09/01/2021    Procedure: COLONOSCOPY, FLEXIBLE, PROXIMAL TO SPLENIC FLEXURE; DIAGNOSTIC, W/WO COLLECTION SPECIMEN BY BRUSH OR WASH;  Surgeon: Liane Comber, MD;  Location: [facility] [place];  Service: Gastroenterology    PR COLSC FLX W/RMVL OF TUMOR POLYP LESION SNARE TQ N/A 10/20/2015    Procedure: COLONOSCOPY FLEX; W/REMOV TUMOR/LES BY SNARE;  Surgeon: Annie Paras, MD;  [facility];  Service: Gastroenterology    PR EXCISION SUBMAXILLARY GLAND Right 03/31/2015    Procedure: EXC SUBMANDIBULAR GLAND;  Surgeon: Lauralee Evener, MD;  Location: ASC OR [facility];  Service: ENT    PR REMV UPPER JAW-MAXILLECTOMY Left 07/22/2019    Procedure: MAXILLECTOMY; WO ORBITAL EXENTERATION;  Surgeon: Adam Swaziland Kimple, MD;  Location: ASC OR [facility];  Service: ENT    PR STEREOTACTIC COMP ASSIST PROC,CRANIAL,EXTRADURAL Left 07/22/2019    Procedure: STEREOTACTIC COMPUTER-ASSISTED (NAVIGATIONAL) PROCEDURE; CRANIAL, EXTRADURAL;  Surgeon: Adam Swaziland  Ralene Ok, MD;  Location: ASC OR [facility];  Service: ENT    PR UPPER GI ENDOSCOPY,DIAGNOSIS N/A 08/18/2017    Procedure: UGI ENDO, INCLUDE ESOPHAGUS, STOMACH, & DUODENUM &/OR JEJUNUM; DX W/WO COLLECTION SPECIMN, BY BRUSH OR WASH;  Surgeon: Rona Ravens, MD;  [facility];  Service: Gastroenterology    PR UPPER GI ENDOSCOPY,DIAGNOSIS N/A 11/08/2019    Procedure: UGI ENDO, INCLUDE ESOPHAGUS, STOMACH, & DUODENUM &/OR JEJUNUM; DX W/WO COLLECTION SPECIMN, BY BRUSH OR WASH;  Surgeon: Janyth Pupa, MD;  [facility]: [facility];  Service: Gastroenterology    SKIN BIOPSY         Medications:  Current Outpatient Medications   Medication Sig Dispense Refill    albuterol HFA 90 mcg/actuation inhaler Inhale 2 puffs every four (4) hours as needed for wheezing. 8 g 3    aspirin (ECOTRIN) 81 MG tablet Take 1 tablet (81 mg total) by mouth daily.      atorvastatin (LIPITOR) 20 MG tablet Take 1 tablet (20 mg total) by mouth nightly. 90 tablet 3    buprenorphine-naloxone (SUBOXONE) 8-2 mg sublingual film Place 1 Film (8 mg of buprenorphine total) under the tongue two (2) times a day for 28 days. 56 Film 0    cholecalciferol, vitamin D3-25 mcg, 1,000 unit,, 25 mcg (1,000 unit) capsule Take 1 capsule (25 mcg total) by mouth daily.      furosemide (LASIX) 20 MG tablet Take 1 tablet (20 mg total) by mouth daily as needed for swelling. 30 tablet 6    lactulose 10 gram/15 mL solution Take 15 mL (10 g total) by mouth Two (2) times a day. 900 mL 6    loratadine (CLARITIN) 10 mg tablet Take 1 tablet (10 mg total) by mouth daily.      mirtazapine (REMERON) 15 MG tablet Take 1 tablet (15 mg total) by mouth nightly. 90 tablet 3    naloxone (NARCAN) 4 mg nasal spray One spray in either nostril once for known/suspected opioid overdose. May repeat every 2-3 minutes in alternating nostril til EMS arrives 2 each PRN    rifAXIMin (XIFAXAN) 550 mg Tab Take 1 tablet (550 mg total) by mouth Two (2) times a day. 60 tablet 5    sildenafiL (VIAGRA) 100 MG tablet Take 1 tablet (100 mg total) by mouth daily as needed for erectile dysfunction. 30 tablet 2    tamsulosin (FLOMAX) 0.4 mg capsule Take 1 capsule (0.4 mg total) by mouth two (2) times a day. 180 capsule 3    umeclidinium-vilanteroL (ANORO ELLIPTA) 62.5-25 mcg/actuation inhaler Inhale 1 puff daily. 60 each 11     No current facility-administered medications for this visit.       Allergies:  Bicalutamide, Penicillins, and Rocephin [ceftriaxone]     Social History:  Patient  reports that he has been smoking cigarettes. He started smoking about 53 years ago. He has a 53.1 pack-year smoking history. He quit smokeless tobacco use about 14 years ago.  His smokeless tobacco use included chew. He reports that he does not currently use drugs after having used the following drugs: Cocaine, IV, and Marijuana. He reports that he does not drink alcohol.     Family History:  The patient's family history includes Bone cancer in his mother; Heart attack in his father; Hepatitis in his sister; Hypertension in his father and mother; Lung cancer in his mother.     ROS:   A comprehensive 10-system review was negative, except as noted  in HPI.    Physical Exam:    General: well developed, well nourished, no acute distress  HEENT: PERLA, EOM intact, normocephalic, atraumatic  Neck: supple and no masses  Chest: symmetrical  Lungs: non-labored breathing  Heart: normal rhythm, no JVD  Abdomen: no tenderness, no masses or hernias, no palpable organomegaly  GU:  Rectal:  Extremities: no deformities, no edema, no cyanosis

## 2022-07-13 ENCOUNTER — Ambulatory Visit: Admit: 2022-07-13 | Discharge: 2022-07-14 | Payer: PRIVATE HEALTH INSURANCE

## 2022-07-14 NOTE — Unmapped (Signed)
Called patient to discuss results - no answer. Unable to leave message.  Sent [employer].    No hydro on RUS.  Recommend just observation.  Suspect some mild narrowing at distal ureter (at site of small stone).  If pain recurs, would recommend ureteroscopy.

## 2022-07-26 ENCOUNTER — Ambulatory Visit: Admit: 2022-07-26 | Discharge: 2022-07-27 | Payer: PRIVATE HEALTH INSURANCE

## 2022-07-26 DIAGNOSIS — F17200 Nicotine dependence, unspecified, uncomplicated: Principal | ICD-10-CM

## 2022-07-26 DIAGNOSIS — Z87891 Personal history of nicotine dependence: Principal | ICD-10-CM

## 2022-07-26 DIAGNOSIS — F1111 Opioid abuse, in remission: Principal | ICD-10-CM

## 2022-07-26 DIAGNOSIS — J438 Other emphysema: Principal | ICD-10-CM

## 2022-07-26 NOTE — Unmapped (Cosign Needed)
[facility] at [facility]     Type of visit: face to face    Are you located in [place]? (for virtual visits only) Yes    Reason for visit: Follow up    Questions / Concerns that need to be addressed: Follow up, no questions at this time    Screening BP- 130/44  71      HCDM reviewed and updated in Epic:    We are working to make sure all of our patients??? wishes are updated in Epic and part of that is documenting a Environmental health practitioner for each patient  A Health Care Decision Maker is someone you choose who can make health care decisions for you if you are not able - who would you most want to do this for you????  is already up to date.    HCDM (patient stated preference): Pamala Duffel - Spouse - [phone]    HCDM, First Alternate: Katherina Right - Daughter - [phone]    HCDM, Second Alternate: Joaquin Bend - Daughter - [phone]    COVID-19 Vaccine Summary  Which COVID-19 Vaccine was administered  Moderna  Type:  Dates Given:  05/31/2022                   If no: Are you interested in scheduling? Wants vaccine - eligible now    Immunization History   Administered Date(s) Administered    COVID-19 VAC,BIVALENT,MODERNA(BLUE CAP) 06/03/2021    COVID-19 VACCINE,MRNA(MODERNA)(PF) 10/07/2019, 11/05/2019, 06/21/2020, 12/01/2020    Covid-19 Vac, (28yr+) (Spikevax) Monovalent Xbb.1.5 Moder  05/31/2022    Hepatitis A (Adult) 07/28/2016, 01/26/2017    Hepatitis A Vaccine Pediatric / Adolescent 2 Dose IM 03/05/2014    Influenza Vaccine Quad (IIV4 W/PRESERV) 42MO+ 06/01/2017, 05/12/2021    Influenza Vaccine Quad(IM)6 MO-Adult(PF) 06/02/2014, 06/16/2016, 04/19/2019, 05/31/2022    Influenza Virus Vaccine, unspecified formulation 05/18/2017, 05/22/2018, 05/24/2018, 05/07/2020, 05/25/2020    PNEUMOCOCCAL POLYSACCHARIDE 23-VALENT 03/05/2014    TdaP 04/19/2019       __________________________________________________________________________________________

## 2022-07-26 NOTE — Unmapped (Signed)
[facility] - Faculty Practice  Internal Medicine Clinic Visit    Reason for visit: MOUD follow-up, weight check    A/P:    1. Opioid use disorder, mild, in sustained remission (CMS-HCC)    2. History of tobacco use, presenting hazards to health    3. Tobacco use disorder    4. Other emphysema (CMS-HCC)      1. Opioid use disorder, mild, in sustained remission (CMS-HCC)  Patient IMCMATSTABILITY: is stable on maintenance therapy imcmatmanagementplan1: 2 month follow up, next visit virtual    PRESCRIPTIONS GIVEN TODAY:    Medications ordered during this encounter   Medications    buprenorphine-naloxone (SUBOXONE) 8-2 mg sublingual film     Sig: Place 1 Film (8 mg of buprenorphine total) under the tongue two (2) times a day for 28 days.     Dispense:  56 Film     Refill:  0     NADEAN:[id number]    buprenorphine-naloxone (SUBOXONE) 8-2 mg sublingual film     Sig: Place 1 Film (8 mg of buprenorphine total) under the tongue two (2) times a day for 28 days.     Dispense:  56 Film     Refill:  0     ZOXWRU:[id number]      - Benefits of treatment outweigh the risks  - Utox today: will be positive for:  - PDMP reviewed: Appropriate on review today  - Next appt: Return in 8 weeks (on 09/20/2022) for Video Visit; MAT follow-up; on 2/13 @8 :45am.  - Substance Abuse Counseling:  not currently engaged, previously completed  - Does pt have naloxone at home and know how to use it?  yes  - Labs needed today? no    No results found for: HIV12AB, HIVAGAB  Lab Results   Component Value Date    HEPAIGG Nonreactive 07/22/2015    HEPBCAB Reactive (A) 07/22/2015    HEPCAB Positive (A) 03/05/2014     Lab Results   Component Value Date    ALKPHOS 52 05/11/2022    BILITOT <0.2 (L) 05/11/2022    BILIDIR <0.10 12/23/2020    PROT 6.5 05/11/2022    ALBUMIN 3.7 05/11/2022    ALT 14 05/11/2022    AST 26 05/11/2022     No results found for: PREGTESTUR, PREGSERUM, HCG, HCGQUANT    - Suboxone treatment agreement:  has been signed by patient and scanned into record    - buprenorphine-naloxone (SUBOXONE) 8-2 mg sublingual film; Place 1 Film (8 mg of buprenorphine total) under the tongue two (2) times a day for 28 days.  Dispense: 56 Film; Refill: 0  - buprenorphine-naloxone (SUBOXONE) 8-2 mg sublingual film; Place 1 Film (8 mg of buprenorphine total) under the tongue two (2) times a day for 28 days.  Dispense: 56 Film; Refill: 0    2. History of tobacco use, presenting hazards to health  Prior CT chest given prior unintentional weight loss and identified 6mm nodules in right apex that has been stable x 3 years.  - CT Lung Cancer Screening Baseline or Annual Low Dose; Future    3. Tobacco use disorder  Discussed cessation.  Not currently ready to quit, but in contemplative state.  Will continue to reassess.  During today???s visit, I completed the following (choose corresponding billing codes from billing preference list):    ---Smoking cessation 3-10 minute: I spent 8 minutes counseling about smoking cessation (use diagnosis code Z87.891)      4. Other emphysema (CMS-HCC)  Currently symptomatic with night time awakenings with cough.  Has followed with [facility] and most recently stepped down therapy from Trelegy --> Advair due to lack of exacerbations and current smoking status.  Despite current night time awakenings requiring albuterol inhaler, did not notice difference with step down in therapy.   Continue current regimen and re-eval smoking cessation at subsequent visits.      Return in 8 weeks (on 09/20/2022) for Video Visit; MAT follow-up.        __________________________________________________________    HPI:    Weight stable/slightly increased.  Happy with this.  Goal to eventually get closer to 125lbs again.  No fever or night sweats.  No issues with renal colic recently. Has tentative plan for procedure if pain returns given stone in ureter when last imaged. Urinating frequently and feels like he empties bladder. No hematuria he has noted.   Continues to have some cough.   Smoking since was a  or 66 years old.  Currently smokes about almost a pack a day.  Has Quit before for a few weeks. Does not smoke in house or truck.  Smokes less than before because of this.  Grandchildren don't know that he smokes.   Wakes up in the morning and has to smoke. Also wakes up in the middle of the night to smoke. Discuss his current inhalers.  Previously on Trelegy --> Anora ellipta by pulmonology given that he still spokes.       __________________________________________________________        Medications:  Reviewed in EPIC  __________________________________________________________    Physical Exam:   Vital Signs:  Vitals:    07/26/22 0832   BP: 130/44   BP Site: L Arm   BP Position: Sitting   BP Cuff Size: Large   Pulse: (!) 44   Temp: 37 ??C (98.6 ??F)   TempSrc: Temporal   SpO2: 97%   Weight: 52.6 kg (116 lb)   Height: 167.6 cm (5' 5.98)          PTHomeBP    Gen: Thin, NAD, + slight temporal wasting  CV: RRR, no murmurs  Pulm: CTA bilaterally, no crackles or wheezes  Abd: Soft, NTND, normal BS.   Ext: No edema      PHQ-9 Score:     GAD-7 Score:       Medication adherence and barriers to the treatment plan have been addressed. Opportunities to optimize healthy behaviors have been discussed. Patient / caregiver voiced understanding.

## 2022-07-26 NOTE — Unmapped (Signed)
Please schedule with [facility] Radiology for your CT scan of your lungs:  [facility]  [address]  [place], [place] [address]  Scheduling: [phone]  Office: [phone]

## 2022-07-28 MED ORDER — BUPRENORPHINE 8 MG-NALOXONE 2 MG SUBLINGUAL FILM
ORAL_FILM | Freq: Two times a day (BID) | SUBLINGUAL | 0 refills | 28 days | Status: CP
Start: 2022-07-28 — End: 2022-08-25

## 2022-08-04 NOTE — Unmapped (Signed)
Harold Richards requested a refill of their Xifaxan via IVR. The [facility] has scheduled delivery per the patients request via Next Day Courier to be delivered to their prescription address on 08/10/22.          The [facility] attempted to contact [name] 08/03/2022 via MyChart message and questionnaire to notify them that they are due for refill of their Xifaxan.

## 2022-08-09 MED FILL — XIFAXAN 550 MG TABLET: ORAL | 90 days supply | Qty: 180 | Fill #3

## 2022-08-16 IMAGING — CR DG CHEST 2V
2 series · 2 of 2 positions shown · non-contrast
Comparison: 06/03/2005

CLINICAL DATA: Cough for 4 weeks

EXAM:
CHEST - 2 VIEW

[chest pa]
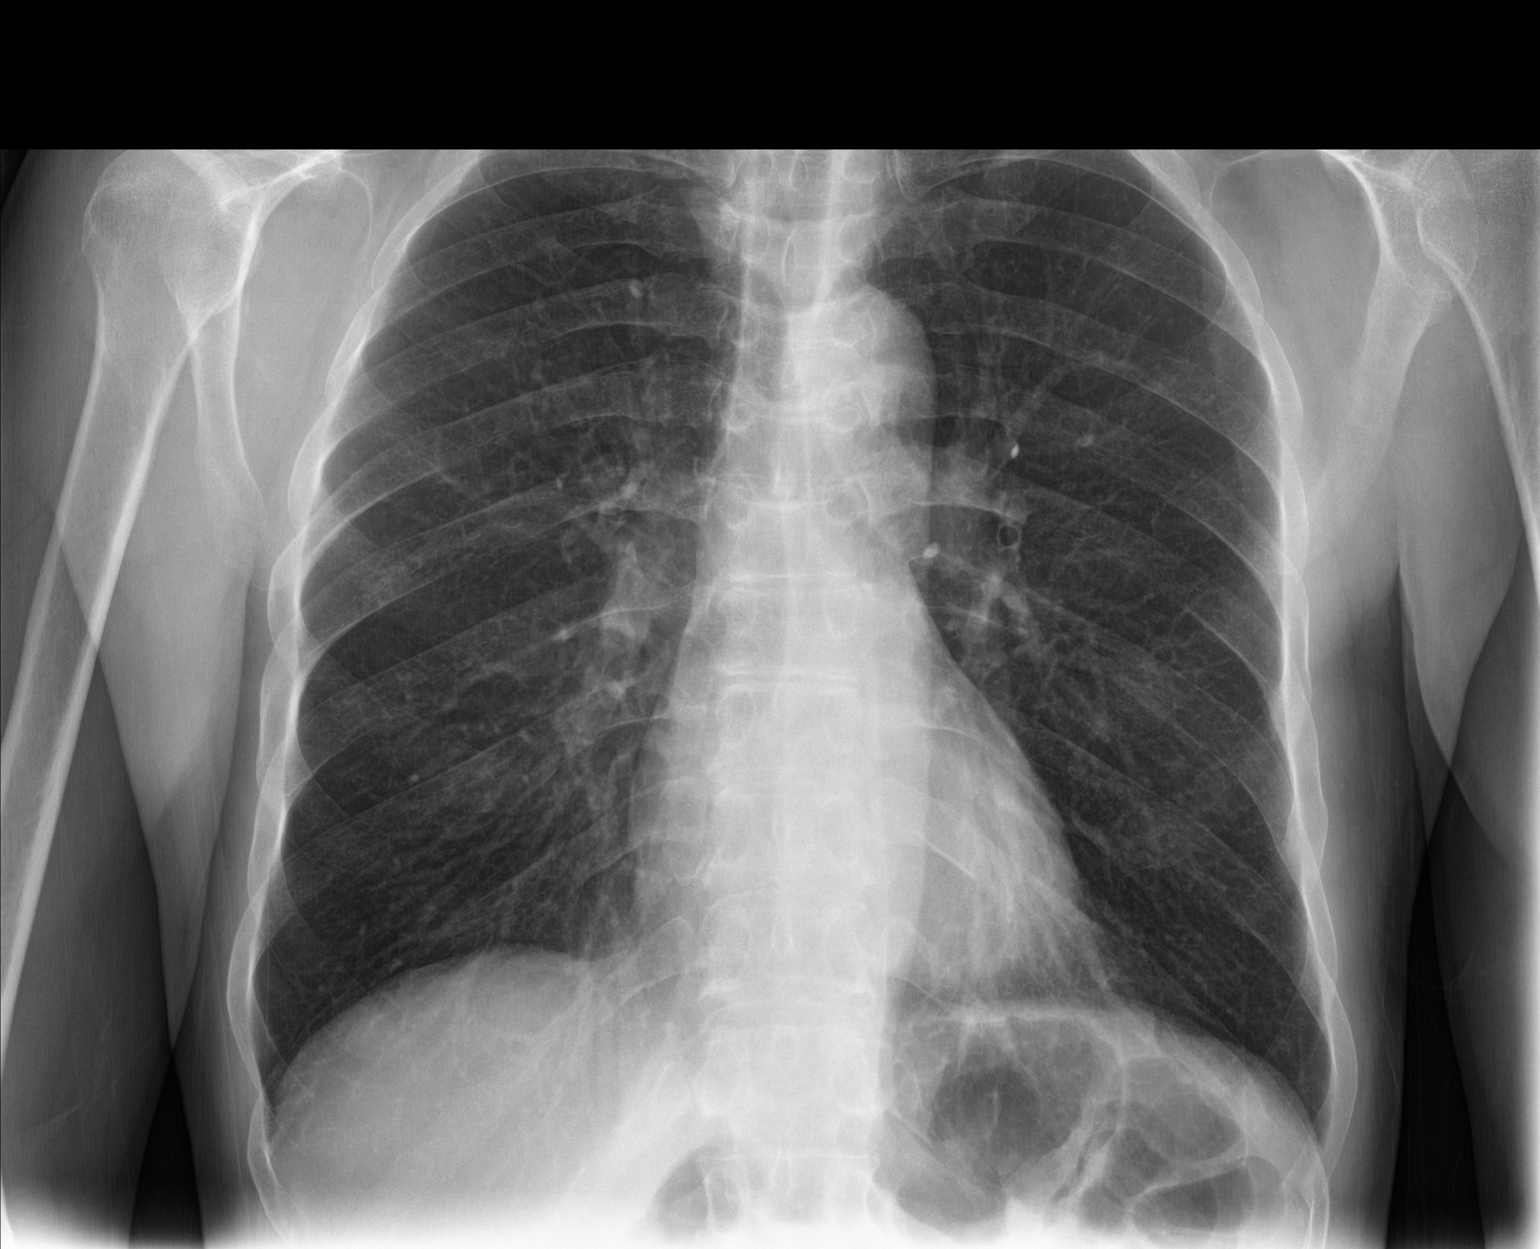

[chest lat]
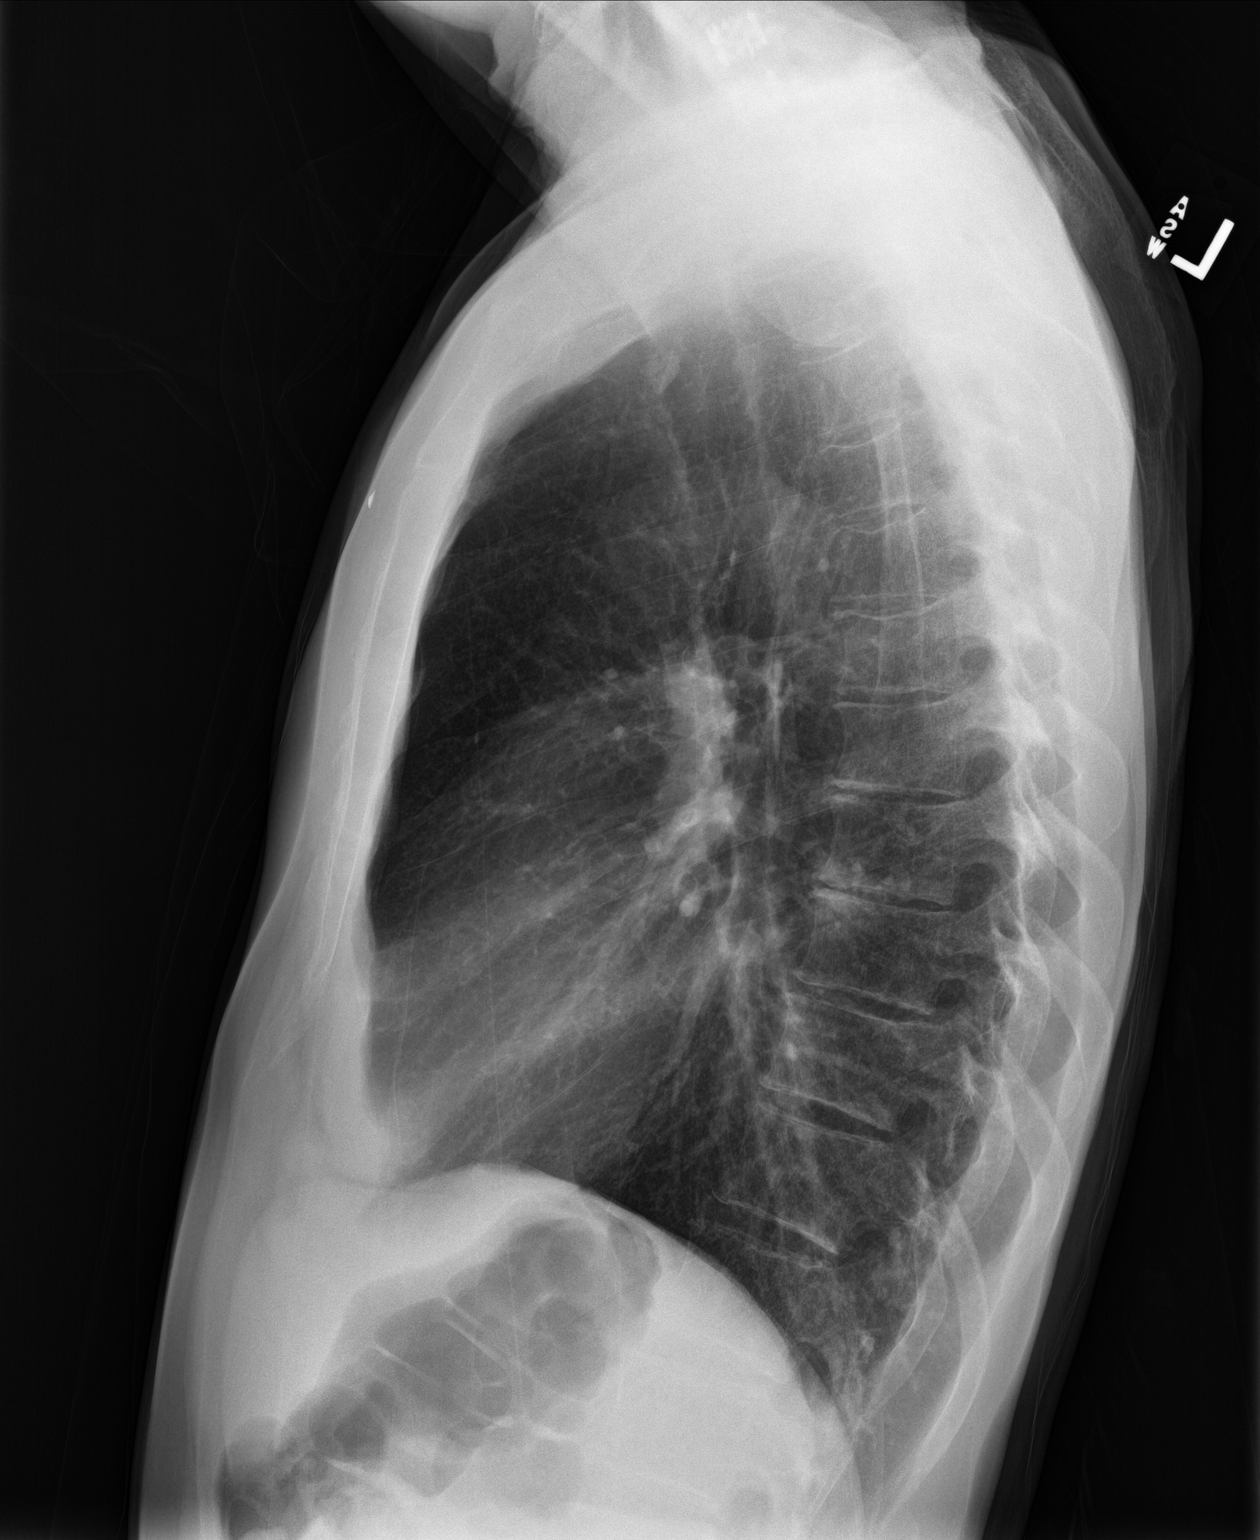

[2 of 2 positions shown; findings below may reference images not displayed]

FINDINGS: The heart size and mediastinal contours are within normal limits.
Both lungs are clear. The visualized skeletal structures are
unremarkable.
IMPRESSION: No active cardiopulmonary disease.

## 2022-08-25 MED ORDER — BUPRENORPHINE 8 MG-NALOXONE 2 MG SUBLINGUAL FILM
ORAL_FILM | Freq: Two times a day (BID) | SUBLINGUAL | 0 refills | 28 days | Status: CP
Start: 2022-08-25 — End: 2022-09-22

## 2022-09-05 ENCOUNTER — Ambulatory Visit: Admit: 2022-09-05 | Discharge: 2022-09-06 | Payer: PRIVATE HEALTH INSURANCE

## 2022-09-05 DIAGNOSIS — K7460 Unspecified cirrhosis of liver: Principal | ICD-10-CM

## 2022-09-05 LAB — CBC
HEMATOCRIT: 39.5 % (ref 39.0–48.0)
HEMOGLOBIN: 13.6 g/dL (ref 12.9–16.5)
MEAN CORPUSCULAR HEMOGLOBIN CONC: 34.3 g/dL (ref 32.0–36.0)
MEAN CORPUSCULAR HEMOGLOBIN: 36.3 pg — ABNORMAL HIGH (ref 25.9–32.4)
MEAN CORPUSCULAR VOLUME: 105.7 fL — ABNORMAL HIGH (ref 77.6–95.7)
MEAN PLATELET VOLUME: 7.1 fL (ref 6.8–10.7)
PLATELET COUNT: 171 10*9/L (ref 150–450)
RED BLOOD CELL COUNT: 3.73 10*12/L — ABNORMAL LOW (ref 4.26–5.60)
RED CELL DISTRIBUTION WIDTH: 13.1 % (ref 12.2–15.2)
WBC ADJUSTED: 5.6 10*9/L (ref 3.6–11.2)

## 2022-09-05 LAB — AFP TUMOR MARKER: AFP-TUMOR MARKER: 3 ng/mL (ref <=8)

## 2022-09-05 LAB — COMPREHENSIVE METABOLIC PANEL
ALBUMIN: 4.1 g/dL (ref 3.4–5.0)
ALKALINE PHOSPHATASE: 41 U/L — ABNORMAL LOW (ref 46–116)
ALT (SGPT): 14 U/L (ref 10–49)
ANION GAP: 6 mmol/L (ref 5–14)
AST (SGOT): 27 U/L (ref <=34)
BILIRUBIN TOTAL: 0.2 mg/dL — ABNORMAL LOW (ref 0.3–1.2)
BLOOD UREA NITROGEN: 16 mg/dL (ref 9–23)
BUN / CREAT RATIO: 17
CALCIUM: 9.4 mg/dL (ref 8.7–10.4)
CHLORIDE: 113 mmol/L — ABNORMAL HIGH (ref 98–107)
CO2: 26.5 mmol/L (ref 20.0–31.0)
CREATININE: 0.96 mg/dL
EGFR CKD-EPI (2021) MALE: 88 mL/min/{1.73_m2} (ref >=60)
GLUCOSE RANDOM: 99 mg/dL (ref 70–179)
POTASSIUM: 4.8 mmol/L (ref 3.4–4.8)
PROTEIN TOTAL: 7 g/dL (ref 5.7–8.2)
SODIUM: 145 mmol/L (ref 135–145)

## 2022-09-05 LAB — PROTIME-INR
INR: 1.22
PROTIME: 13.6 s — ABNORMAL HIGH (ref 9.9–12.6)

## 2022-09-05 NOTE — Unmapped (Addendum)
Great to see you.     Today at your appointment:     - blood work (MELD labs) I will be in touch via mychart or telephone if there is anything that I need to discuss.   - Liver cancer screening needs to be done every 6 months: MRI ordered for [facility] radiology- let me know if they give you any push back and we can appeal.   - please eat a diet low in sodium (less than 2 grams daily) and high in protein (at least 80/ 100 grams daily). Can try protein shakes, chicken, fish, eggs, yogurt)  - try to limit pepsi by slowly weaning off by alternating with water --> goal to be drinking half by the next time to see you. Can try flavored club soda (la croix, bubbly, aha, food lion)  - really try to limit caffeine after noon to help with sleep.   - we discussed sleep hygiene   - No more than 2 grams tylenol for pain. Avoid NSAIDs (ibuprofen, Aleve, motrin) in liver disease  - avoid alcohol, no safe amount with chronic liver disease  - maintain a healthy weight with diet and exercise  - the following vaccines were given: shingles (shingrix)- next dose can get at a pharmacy in 2 months or wait until you see me about in 6 months.     - Follow- up in 6 months    It was a pleasure caring for you today. Please contact me or my nurse, Silvestre Moment, with any questions about your care.     Reeves Forth. Orville Mena, ANP- [facility]  [address]  [place] [address]  Phone [phone]       Important Contact Numbers:    Nurse:  Silvestre Moment, RN  Phone: [phone]  Main line [facility] Liver: [phone]        Radiology: [phone]- Ultrasound/ MRI  If you are being scheduled for any type of radiology, you will need to call to receive your appointment time.?Please call this number for information.?    Important Contact Numbers:     GI Clinic Appointments:? [phone]  Please call the GI Clinic appointment line if you need to schedule, reschedule or cancel an appointment in clinic.? They can also answer any questions you may have about where your appointment is located and when you need to arrive.     GI Procedure Appointments:?[phone]  Please call the GI Procedures line if you need to schedule, reschedule or cancel ANY type of procedure (EGD, colonoscopy, motility testing, etc).? You can also call this number for prep instructions, etc.?     Radiology: [phone], option 3 or 4  If you are being scheduled for any type of radiology, you will need to call to receive your appointment time.? Please call this number for information.?     For emergencies after normal business hours or on weekends/holidays  Proceed to the nearest emergency room OR contact the [facility] Operator at [phone] who can page the Gastroenterology Fellow on call.     Financial Counselors:   Tracey Harries [phone]   Dorisann Frames [phone]      Pharmacy Assistance: [phone]

## 2022-09-05 NOTE — Unmapped (Signed)
[facility] Harold Richards [phone]      PCP:  Roma Schanz, MD     Reason for Office Follow-up: Decompensated HCV cirrhosis (+HE, -ascites, -upper GI bleed). HCV treatment  ~ 2016    Presentation of Current Illness:    Harold Richards is a 67 y.o. pleasant Caucasian gentleman who presents today for follow up care. 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 Cured of HCV in 2016 s/p Harvoni x 12 weeks. He was treatment naive with genotype 1a. History of recreational drug use (cocaine IV, intranasal and crack), three tattoos with one being homemade, and incarceration. Fibroscan 20 kPa consistent with F4 (cirrhosis). PMH of prostate cancer (high risk prostate cancer, cT1c, Gleason 4+4=8 in 2/12 cores, PSA 4.59 s/p external beam RT (45Gy) with brachytherapy boost (brachy done 11/05/2018), OSA with overlap COPD, basal cell carcinoma, chronic fungal infection of maxillary sinus, pulmonary nodules, nicotine dependency, chronic headaches, and renal calculus. More recently he has been diagnosed with neurocognitive impairment and has been following with neurology. Was on remeron and aricept, but is no longer taking.       Interval hx; LCV 03/2022>   No hospitalizations or ED visits. No HE (well controlled with lasix/ xifaxan) Cardiology put him on lasix PRN for BLE. Having fatigue and. He is drinking 8-9 regular sodas a day. Not sleeping well at night (waking up several times a night). Still smoking about 1 ppd.       Cirrhosis Care:  -- Varices screen: normal platelets and spleen size with Fibroscan < 25 kPa. EGD 2021 without evidence of varices. EGD pending  (ordered by PCP  for weight loss)  -- HCC screen: MRI of abdomen 10/2021   Impression   -- Multiple LR-2 nodules, as described above, which are unchanged. No concerning hepatic lesions.   -- Undulating liver contours and left lobe hypertrophy, which can be seen with liver disease/cirrhosis.       -- Immunity hepatitis B.   -- Immunity hepatitis A: Series completed.   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      Medications:  Current Outpatient Medications   Medication Sig Dispense Refill    albuterol HFA 90 mcg/actuation inhaler Inhale 2 puffs every four (4) hours as needed for wheezing. 8 g 3    aspirin (ECOTRIN) 81 MG tablet Take 1 tablet (81 mg total) by mouth daily.      atorvastatin (LIPITOR) 20 MG tablet Take 1 tablet (20 mg total) by mouth nightly. 90 tablet 3    buprenorphine-naloxone (SUBOXONE) 8-2 mg sublingual film Place 1 Film (8 mg of buprenorphine total) under the tongue two (2) times a day for 28 days. 56 Film 0    cholecalciferol, vitamin D3-25 mcg, 1,000 unit,, 25 mcg (1,000 unit) capsule Take 1 capsule (25 mcg total) by mouth daily.  furosemide (LASIX) 20 MG tablet Take 1 tablet (20 mg total) by mouth daily as needed for swelling. 30 tablet 6    lactulose 10 gram/15 mL solution Take 15 mL (10 g total) by mouth Two (2) times a day. 900 mL 6    loratadine (CLARITIN) 10 mg tablet Take 1 tablet (10 mg total) by mouth daily.      mirtazapine (REMERON) 15 MG tablet Take 1 tablet (15 mg total) by mouth nightly. 90 tablet 3    naloxone (NARCAN) 4 mg nasal spray One spray in either nostril once for known/suspected opioid overdose. May repeat every 2-3 minutes in alternating nostril til EMS arrives 2 each PRN    rifAXIMin (XIFAXAN) 550 mg Tab Take 1 tablet (550 mg total) by mouth Two (2) times a day. 60 tablet 5    sildenafiL (VIAGRA) 100 MG tablet Take 1 tablet (100 mg total) by mouth daily as needed for erectile dysfunction. 30 tablet 2    tamsulosin (FLOMAX) 0.4 mg capsule Take 1 capsule (0.4 mg total) by mouth two (2) times a day. 180 capsule 3    umeclidinium-vilanteroL (ANORO ELLIPTA) 62.5-25 mcg/actuation inhaler Inhale 1 puff daily. 60 each 11     No current facility-administered medications for this visit.     Active Ambulatory Problems     Diagnosis Date Noted    Chronic headaches     Renal calculus     Tobacco use disorder 02/08/2013    Nasal septum perforation 02/12/2013    Insomnia 02/13/2013    Other emphysema (CMS-HCC)     Hepatitis C 03/05/2014    Osteoporosis of lumbar spine 06/09/2015    Proteinuria 09/28/2017    Prostate cancer (CMS-HCC) 09/11/2018    Cirrhosis (CMS-HCC) 03/14/2019    Community acquired pneumonia of right upper lobe of lung 04/07/2019    Sinusitis 06/24/2019    Neoplasm of maxillary sinus 06/24/2019    Opioid use disorder, mild, in sustained remission, on maintenance therapy (CMS-HCC) 02/25/2020    History of nonmelanoma skin cancer 10/14/2020    Opioid use disorder, mild, in sustained remission (CMS-HCC) 05/31/2022    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     Resolved Ambulatory Problems     Diagnosis Date Noted    Chest pain 02/07/2013    GERD (gastroesophageal reflux disease) 02/08/2013    Skin lesion 02/12/2013    Sialolithiasis of submandibular gland 04/08/2015    Rib pain on left side 06/16/2016    Health care maintenance 06/16/2016    Nonspecific L Lung micronodules, repeat LDCT due 07/12/2017 07/12/2016    Atherosclerosis of coronary artery 08/16/2016    Actinic keratosis 02/01/2017    Elevated PSA 09/28/2017    Benign localized hyperplasia of prostate with urinary obstruction 09/28/2017    Abnormal prostate exam 09/28/2017    Hepatic encephalopathy (CMS-HCC) 03/14/2019     Past Medical History:   Diagnosis Date    Asthma     Basal cell carcinoma     Colon polyp     Enlarged prostate     Hx of substance abuse (CMS-HCC)     Infectious viral hepatitis     Osteoporosis     Skin cancer     Tobacco abuse 02/08/2013     Past Surgical History:   Procedure Laterality Date    CERVICAL FUSION      cage    PR COLONOSCOPY FLX DX W/COLLJ SPEC WHEN PFRMD N/A 09/01/2021    Procedure: COLONOSCOPY, FLEXIBLE, PROXIMAL TO SPLENIC FLEXURE; DIAGNOSTIC,  W/WO COLLECTION SPECIMEN BY BRUSH OR WASH;  Surgeon: Liane Comber, MD;  Location: [facility] PROCEDURES [facility];  Service: Gastroenterology    PR COLSC FLX W/RMVL OF TUMOR POLYP LESION SNARE TQ N/A 10/20/2015    Procedure: COLONOSCOPY FLEX; W/REMOV TUMOR/LES BY SNARE;  Surgeon: Annie Paras, MD;  [facility];  Service: Gastroenterology    PR EXCISION SUBMAXILLARY GLAND Right 03/31/2015    Procedure: EXC SUBMANDIBULAR GLAND;  Surgeon: Lauralee Evener, MD;  Location: ASC OR [facility];  Service: ENT    PR REMV UPPER JAW-MAXILLECTOMY Left 07/22/2019    Procedure: MAXILLECTOMY; WO ORBITAL EXENTERATION;  Surgeon: Adam Swaziland Kimple, MD;  Location: ASC OR [facility];  Service: ENT    PR STEREOTACTIC COMP ASSIST PROC,CRANIAL,EXTRADURAL Left 07/22/2019    Procedure: STEREOTACTIC COMPUTER-ASSISTED (NAVIGATIONAL) PROCEDURE; CRANIAL, EXTRADURAL;  Surgeon: Adam Swaziland Kimple, MD;  Location: ASC OR [facility];  Service: ENT    PR UPPER GI ENDOSCOPY,DIAGNOSIS N/A 08/18/2017    Procedure: UGI ENDO, INCLUDE ESOPHAGUS, STOMACH, & DUODENUM &/OR JEJUNUM; DX W/WO COLLECTION SPECIMN, BY BRUSH OR WASH;  Surgeon: Rona Ravens, MD;  [facility];  Service: Gastroenterology    PR UPPER GI ENDOSCOPY,DIAGNOSIS N/A 11/08/2019    Procedure: UGI ENDO, INCLUDE ESOPHAGUS, STOMACH, & DUODENUM &/OR JEJUNUM; DX W/WO COLLECTION SPECIMN, BY BRUSH OR WASH;  Surgeon: Janyth Pupa, MD;  Location: [facility];  Service: Gastroenterology    SKIN BIOPSY       Family History   Problem Relation Age of Onset    Heart attack Father     Hypertension Father Lung cancer Mother     Bone cancer Mother     Hypertension Mother     Hepatitis Sister     Melanoma Neg Hx       Social History     Socioeconomic History    Marital status: Married   Occupational History    Occupation: retired   Tobacco Use    Smoking status: Every Day     Current packs/day: 1.00     Average packs/day: 1 pack/day for 53.2 years (53.2 ttl pk-yrs)     Types: Cigarettes     Start date: 06/16/1969    Smokeless tobacco: Former     Types: Chew     Quit date: 2009   Vaping Use    Vaping Use: Never used   Substance and Sexual Activity    Alcohol use: No     Alcohol/week: 0.0 standard drinks of alcohol    Drug use: Not Currently     Types: Cocaine, IV, Marijuana     Comment: History of cocaine use years ago (intranasal, IV and crack), marijuana use.     Sexual activity: Yes     Partners: Female   Other Topics Concern    Do you use sunscreen? No    Tanning bed use? No    Are you easily burned? No    Excessive sun exposure? Yes    Blistering sunburns? Yes     Social Determinants of Health     Financial Resource Strain: Low Risk  (07/26/2022)    Overall Financial Resource Strain (CARDIA)     Difficulty of Paying Living Expenses: Not hard at all   Food Insecurity: No Food Insecurity (07/26/2022)    Hunger Vital Sign     Worried About Running Out of Food in the Last Year: Never true     Ran Out of Food in the  Last Year: Never true   Transportation Needs: No Transportation Needs (07/26/2022)    PRAPARE - Therapist, art (Medical): No     Lack of Transportation (Non-Medical): No     Physical Examination:   BP 125/54 (BP Site: L Arm, BP Position: Sitting, BP Cuff Size: Medium)  - Pulse 59  - Temp 37.2 ??C (99 ??F) (Temporal)  - Ht 167.6 cm (5' 6)  - Wt 53.3 kg (117 lb 6.4 oz)  - SpO2 98%  - BMI 18.95 kg/m??   Constitutional: in no apparent distress.   Eyes: Anicteric sclerae.   Cardiovascular: trace peripheral edema.   Gastrointestinal: Soft, nontender abdomen without hepatosplenomegaly, hernias or masses.   Neurologic: Nonfocal, no asterixis.   Psychiatric: Alert and oriented to person, place and time. Normal affect.   Temporal wasting     Laboratory Studies:  Office Visit on 09/05/2022   Component Date Value Ref Range Status    AFP-Tumor Marker 09/05/2022 3  <=8 ng/mL Final    PT 09/05/2022 13.6 (H)  9.9 - 12.6 sec Final    INR 09/05/2022 1.22   Final    Sodium 09/05/2022 145  135 - 145 mmol/L Final    Potassium 09/05/2022 4.8  3.4 - 4.8 mmol/L Final    Chloride 09/05/2022 113 (H)  98 - 107 mmol/L Final    CO2 09/05/2022 26.5  20.0 - 31.0 mmol/L Final    Anion Gap 09/05/2022 6  5 - 14 mmol/L Final    BUN 09/05/2022 16  9 - 23 mg/dL Final    Creatinine 16/05/9603 0.96  0.73 - 1.18 mg/dL Final    BUN/Creatinine Ratio 09/05/2022 17   Final    eGFR CKD-EPI (2021) Male 09/05/2022 88  >=60 mL/min/1.35m2 Final    Glucose 09/05/2022 99  70 - 179 mg/dL Final    Calcium 54/04/8118 9.4  8.7 - 10.4 mg/dL Final    Albumin 14/78/2956 4.1  3.4 - 5.0 g/dL Final    Total Protein 09/05/2022 7.0  5.7 - 8.2 g/dL Final    Total Bilirubin 09/05/2022 0.2 (L)  0.3 - 1.2 mg/dL Final    AST 21/30/8657 27  <=34 U/L Final    ALT 09/05/2022 14  10 - 49 U/L Final    Alkaline Phosphatase 09/05/2022 41 (L)  46 - 116 U/L Final    WBC 09/05/2022 5.6  3.6 - 11.2 10*9/L Final    RBC 09/05/2022 3.73 (L)  4.26 - 5.60 10*12/L Final    HGB 09/05/2022 13.6  12.9 - 16.5 g/dL Final    HCT 84/69/6295 39.5  39.0 - 48.0 % Final    MCV 09/05/2022 105.7 (H)  77.6 - 95.7 fL Final    MCH 09/05/2022 36.3 (H)  25.9 - 32.4 pg Final    MCHC 09/05/2022 34.3  32.0 - 36.0 g/dL Final    RDW 28/41/3244 13.1  12.2 - 15.2 % Final    MPV 09/05/2022 7.1  6.8 - 10.7 fL Final    Platelet 09/05/2022 171  150 - 450 10*9/L Final   ]MELD 3.0: 8 at 09/05/2022  9:17 AM  MELD-Na: 8 at 09/05/2022  9:17 AM  Calculated from:  Serum Creatinine: 0.96 mg/dL (Using min of 1 mg/dL) at 0/05/2724  3:66 AM  Serum Sodium: 145 mmol/L (Using max of 137 mmol/L) at 09/05/2022  9:17 AM  Total Bilirubin: 0.2 mg/dL (Using min of 1 mg/dL) at 4/40/3474  2:59 AM  Serum Albumin: 4.1 g/dL (  Using max of 3.5 g/dL) at 1/61/0960  4:54 AM  INR(ratio): 1.22 at 09/05/2022  9:17 AM  Age at listing (hypothetical): 66 years  Sex: Male at 09/05/2022  9:17 AM   ]  Assessment/Plan:     #Cirrhosis: decompensated 2/2 HCV (cured)  Stable MELD of 8. Fibroscan is consistent with F4 disease.   - MELD labs ordered, AFP  - Avoid alcohol use.   -HE: Continue lactulose 15 ml bid and Xifaxan 550 mg bid  -HCC screening: insurance denied MRI request.  History of LRAD-2 and LRAD- 3 lesions in the setting of cirrhosis. q 6 months. Will repeat MRI abd. Order placed. [facility]   -Variceal Screening: No history of varices. EGD 11/2019 without varices. Repeat upper endoscopy not necessarily recommended if remains well compensated given PLT count >150 and fibroscan <20 kPa.  --shingles vaccine # 1 (only diluent given (safe report) so never got 1st dose, will need first dose today with next dose in 2- 6 months    #BLE: saw cardiology--> prescribed lasix.     #Anemia; resolved. If continues can c/s EGD to look for PHG. Colonoscopy with non bleeding internal hemorrhoids.     #sarcopenia/ weight loss: malignancy work up has been unremarkable thus far. Has resolved and he is gaining weight now     #fatigue: likely multifactorial ( liver disease, poor sleep hygiene)  --discussed limting soda intake (drinking soda all day up until bedtime) for sleep and general health            It was my pleasure caring for this patient. Please reach out with any questions.     Harold Richards, ANP- [facility]  [facility]  [address]  [place] [address]  [phone]

## 2022-09-08 NOTE — Unmapped (Signed)
Complex Case Management  SUMMARY NOTE    High Risk Care Coordinator  spoke with patient and verified correct patient using two identifiers today to introduce the Complex Case Management program.     Discussed the following:  Program Services, Expectations of participation, and Verified Demographics    Program status: Declined  Patient states that they do not need the services of the Complex Case Management program at this time. Care Coordinator has voiced understanding and will send our contact information to patient via their [place] My Chart shall they need to utilize our services in the future as patient has voiced understanding.      Kennya Schwenn - High Risk Care Coordinator   [facility]  [address], [address]  [place], [place] [address]  P: [phone] F: [phone]  [email or web address]

## 2022-09-20 ENCOUNTER — Telehealth: Admit: 2022-09-20 | Discharge: 2022-09-21 | Payer: PRIVATE HEALTH INSURANCE

## 2022-09-20 DIAGNOSIS — J438 Other emphysema: Principal | ICD-10-CM

## 2022-09-20 DIAGNOSIS — F1111 Opioid abuse, in remission: Principal | ICD-10-CM

## 2022-09-20 NOTE — Unmapped (Signed)
[facility]  Established Patient - Video Visit    This visit is conducted via video conferencing.    Contact Information  Person Contacted: Patient  Contact Phone number: [phone] (home)   Is there someone else in the room? No.   Patient agreed to a video visit    Harold Richards is a 67 y.o. male  participating in a video visit.    Reason for visit:  MOUD    Subjective:  Reports his weight is stable, ~115 lbs, out of the shower.  Thinks weight is stable.   Saw GI clinic at the end of January.  Continues to Lactulose 15ml BID and Xifaxan 550mg  BID.  No longer has swelling     Plans to switch insurance to [facility] plan when turns 67 and will keep [employer] as your supplemental.      I have reviewed the problem list, medications, and allergies and have updated/reconciled them if needed.    Objective:  As part of this Video Visit, no in-person exam was conducted.  Video interaction permitted the following observations.    GEN: No acute distress.   SKIN: Color is normal. No rashes.  PSYCH: Appropriate affect, normal mood  RESP: Normal work of breathing, no retractions      Assessment & Plan:  1. Other emphysema (CMS-HCC)    2. Opioid use disorder, mild, in sustained remission (CMS-HCC)        1. Other emphysema (CMS-HCC)  Currently stable on current inhalers.  No changes  Follow-up with [facility] PRN  Continue lung cancer screening (next due 07/2023)    2. Opioid use disorder, mild, in sustained remission (CMS-HCC)  Patient IMCMATSTABILITY: is stable on maintenance therapy [email or web address]: 2 month follow up, next visit in person    PRESCRIPTIONS GIVEN TODAY:    Medications ordered during this encounter   Medications    buprenorphine-naloxone (SUBOXONE) 8-2 mg sublingual film     Sig: Place 1 Film (8 mg of buprenorphine total) under the tongue two (2) times a day.     Dispense:  60 Film     Refill:  0     NADEAN:[id number]    buprenorphine-naloxone (SUBOXONE) 8-2 mg sublingual film     Sig: Place 1 Film (8 mg of buprenorphine total) under the tongue two (2) times a day.     Dispense:  60 Film     Refill:  0     [id number]      - Benefits of treatment outweigh the risks  - Utox today: will be positive for:  - PDMP reviewed: Appropriate on review today  - Next appt: 11/15/22  - Substance Abuse Counseling:  previously completed  - Does pt have naloxone at home and know how to use it?  yes  - Labs needed today? none    No results found for: HIV12AB, HIVAGAB  Lab Results   Component Value Date    HEPAIGG Nonreactive 07/22/2015    HEPBCAB Reactive (A) 07/22/2015    HEPCAB Positive (A) 03/05/2014     Lab Results   Component Value Date    ALKPHOS 41 (L) 09/05/2022    BILITOT 0.2 (L) 09/05/2022    BILIDIR <0.10 12/23/2020    PROT 7.0 09/05/2022    ALBUMIN 4.1 09/05/2022    ALT 14 09/05/2022    AST 27 09/05/2022     No results found for: PREGTESTUR, PREGSERUM, HCG, HCGQUANT    - Suboxone treatment agreement:  has been signed by patient  and scanned into record    - buprenorphine-naloxone (SUBOXONE) 8-2 mg sublingual film; Place 1 Film (8 mg of buprenorphine total) under the tongue two (2) times a day.  Dispense: 60 Film; Refill: 0  - buprenorphine-naloxone (SUBOXONE) 8-2 mg sublingual film; Place 1 Film (8 mg of buprenorphine total) under the tongue two (2) times a day.  Dispense: 60 Film; Refill: 0           Return in 8 weeks (on 11/15/2022) for In-person at 9:10am.           The patient reports they are physically located in [place] and is currently: at home. I conducted a audio/video visit. I spent  16m 06s on the video call with the patient. I spent an additional 4 minutes on pre- and post-visit activities on the date of service .

## 2022-09-20 NOTE — Unmapped (Signed)
[facility]     Type of visit: video    Are you located in [place]? (for virtual visits only) Yes    Reason for visit: Follow up      HCDM reviewed and updated in Epic:    We are working to make sure all of our patients??? wishes are updated in Epic and part of that is documenting a Environmental health practitioner for each patient  A Health Care Decision Maker is someone you choose who can make health care decisions for you if you are not able - who would you most want to do this for you????  is already up to date.    HCDM (patient stated preference): Pamala Duffel - Spouse - [phone]    HCDM, First Alternate: Gross,Christina - Daughter - [phone]    HCDM, Second Alternate: Joaquin Bend - Daughter - [phone]    COVID-19 Vaccine Summary  Which COVID-19 Vaccine was administered  Moderna  Type:  Dates Given:  05/31/2022             Immunization History   Administered Date(s) Administered    COVID-19 VAC,BIVALENT,MODERNA(BLUE CAP) 06/03/2021    COVID-19 VACCINE,MRNA(MODERNA)(PF) 10/07/2019, 11/05/2019, 06/21/2020, 12/01/2020    Covid-19 Vac, (66yr+) (Spikevax) Monovalent Xbb.1.5 Moder  05/31/2022    Hepatitis A (Adult) 07/28/2016, 01/26/2017    Hepatitis A Vaccine Pediatric / Adolescent 2 Dose IM 03/05/2014    Influenza Vaccine Quad (IIV4 W/PRESERV) 47MO+ 06/01/2017, 05/12/2021    Influenza Vaccine Quad(IM)6 MO-Adult(PF) 06/02/2014, 06/16/2016, 04/19/2019, 05/31/2022    Influenza Virus Vaccine, unspecified formulation 05/18/2017, 05/22/2018, 05/24/2018, 05/07/2020, 05/25/2020    PNEUMOCOCCAL POLYSACCHARIDE 23-VALENT 03/05/2014    SHINGRIX-ZOSTER VACCINE (HZV),RECOMBINANT,ADJUVANTED(IM) 09/05/2022    TdaP 04/19/2019       __________________________________________________________________________________________

## 2022-09-22 MED ORDER — BUPRENORPHINE 8 MG-NALOXONE 2 MG SUBLINGUAL FILM
ORAL_FILM | Freq: Two times a day (BID) | SUBLINGUAL | 0 refills | 30 days | Status: CP
Start: 2022-09-22 — End: 2022-10-22

## 2022-09-27 DIAGNOSIS — K7460 Unspecified cirrhosis of liver: Principal | ICD-10-CM

## 2022-10-22 MED ORDER — BUPRENORPHINE 8 MG-NALOXONE 2 MG SUBLINGUAL FILM
ORAL_FILM | Freq: Two times a day (BID) | SUBLINGUAL | 0 refills | 30 days | Status: CP
Start: 2022-10-22 — End: 2022-11-21

## 2022-11-15 ENCOUNTER — Ambulatory Visit: Admit: 2022-11-15 | Discharge: 2022-11-16 | Payer: PRIVATE HEALTH INSURANCE

## 2022-11-15 DIAGNOSIS — C61 Malignant neoplasm of prostate: Principal | ICD-10-CM

## 2022-11-15 DIAGNOSIS — F1111 Opioid abuse, in remission: Principal | ICD-10-CM

## 2022-11-15 DIAGNOSIS — J438 Other emphysema: Principal | ICD-10-CM

## 2022-11-15 LAB — TOXICOLOGY SCREEN, URINE
AMPHETAMINE SCREEN URINE: NEGATIVE
BARBITURATE SCREEN URINE: NEGATIVE
BENZODIAZEPINE SCREEN, URINE: NEGATIVE
BUPRENORPHINE, URINE SCREEN: POSITIVE — AB
CANNABINOID SCREEN URINE: NEGATIVE
COCAINE(METAB.)SCREEN, URINE: NEGATIVE
FENTANYL SCREEN, URINE: NEGATIVE
METHADONE SCREEN, URINE: NEGATIVE
OPIATE SCREEN URINE: NEGATIVE
OXYCODONE SCREEN URINE: NEGATIVE

## 2022-11-15 MED ORDER — TRELEGY ELLIPTA 100 MCG-62.5 MCG-25 MCG POWDER FOR INHALATION
Freq: Every day | RESPIRATORY_TRACT | 3 refills | 90 days | Status: CP
Start: 2022-11-15 — End: 2023-11-15

## 2022-11-15 NOTE — Unmapped (Signed)
Let me know if you are still taking Mirtazapine or not.  If not, that's OK, I will remove from your medication list.     Switch from Anoro inhaler to Trelegy, which you use to be on.  Continue to use albuterol as needed.

## 2022-11-15 NOTE — Unmapped (Signed)
[facility] - Faculty Practice  Internal Medicine Clinic Visit    Reason for visit: MOUD, COPD    A/P:    1. Opioid use disorder, mild, in sustained remission (CMS-HCC)    2. Prostate cancer (CMS-HCC)    3. Other emphysema (CMS-HCC)      1. Opioid use disorder, mild, in sustained remission (CMS-HCC)  Patient IMCMATSTABILITY: is stable on maintenance therapy imcmatmanagementplan1: 2 month follow up, next visit virtual    PRESCRIPTIONS GIVEN TODAY:    Medications ordered during this encounter   Medications    fluticasone-umeclidin-vilanter (TRELEGY ELLIPTA) 100-62.5-25 mcg inhaler     Sig: Inhale 1 puff daily.     Dispense:  90 each     Refill:  3    buprenorphine-naloxone (SUBOXONE) 8-2 mg sublingual film     Sig: Place 1 Film (8 mg of buprenorphine total) under the tongue two (2) times a day.     Dispense:  60 Film     Refill:  0     NADEAN:[id number]    buprenorphine-naloxone (SUBOXONE) 8-2 mg sublingual film     Sig: Place 1 Film (8 mg of buprenorphine total) under the tongue two (2) times a day.     Dispense:  60 Film     Refill:  0     [id number]:[id number]      - Benefits of treatment outweigh the risks  - Utox today: will be positive for:  - PDMP reviewed: Appropriate on review today  - Next appt: 01/17/2023   - Substance Abuse Counseling:  previously completed  - Does pt have naloxone at home and know how to use it?  yes  - Labs needed today? none    No results found for: HIV12AB, HIVAGAB  Lab Results   Component Value Date    HEPAIGG Nonreactive 07/22/2015    HEPBCAB Reactive (A) 07/22/2015    HEPCAB Positive (A) 03/05/2014     Lab Results   Component Value Date    ALKPHOS 41 (L) 09/05/2022    BILITOT 0.2 (L) 09/05/2022    BILIDIR <0.10 12/23/2020    PROT 7.0 09/05/2022    ALBUMIN 4.1 09/05/2022    ALT 14 09/05/2022    AST 27 09/05/2022     No results found for: PREGTESTUR, PREGSERUM, HCG, HCGQUANT    - Suboxone treatment agreement:  has been signed by patient and scanned into record    - Toxicology Screen, Urine  - buprenorphine-naloxone (SUBOXONE) 8-2 mg sublingual film; Place 1 Film (8 mg of buprenorphine total) under the tongue two (2) times a day.  Dispense: 60 Film; Refill: 0  - buprenorphine-naloxone (SUBOXONE) 8-2 mg sublingual film; Place 1 Film (8 mg of buprenorphine total) under the tongue two (2) times a day.  Dispense: 60 Film; Refill: 0    2. Prostate cancer (CMS-HCC)  Continues to follow with radiation oncology and has an appt in early May.  Will get labs then.    3. Other emphysema (CMS-HCC)  Continues to have chronic cough with more night time awakening.   Step back up Anoro --> Trelegy.         Return in 9 weeks (on 01/17/2023) for Video Visit at 8:20am.        __________________________________________________________    HPI:  Feels like breathing not as good.  Coughs frequently.  Uses Albuterol 2-3 times/week.  Some nighttime awakening due to cough, nearly every day.  Anoro uses in the morning  Last COPD exacerbation (  ED Visit) Nov 2021. Was not hospitalized at that time.    __________________________________________________________        Medications:  Reviewed in EPIC  __________________________________________________________    Physical Exam:   Vital Signs:  Vitals:    11/15/22 0855   BP: 125/64   BP Site: L Arm   BP Position: Sitting   BP Cuff Size: Medium   Pulse: 60   Resp: 18   Temp: 36.8 ??C (98.2 ??F)   TempSrc: Temporal   SpO2: 98%   Weight: 53.5 kg (118 lb)   Height: 167.6 cm (5' 6)          PTHomeBP    Gen: Well appearing, thin, NAD  CV: RRR, no murmurs  Pulm: CTA bilaterally, no crackles or wheezes; coarse cough with deep inhalation/exhalation      PHQ-9 Score:     GAD-7 Score:       Medication adherence and barriers to the treatment plan have been addressed. Opportunities to optimize healthy behaviors have been discussed. Patient / caregiver voiced understanding.

## 2022-11-21 DIAGNOSIS — G9340 Encephalopathy, unspecified: Principal | ICD-10-CM

## 2022-11-21 MED ORDER — BUPRENORPHINE 8 MG-NALOXONE 2 MG SUBLINGUAL FILM
ORAL_FILM | Freq: Two times a day (BID) | SUBLINGUAL | 0 refills | 30 days | Status: CP
Start: 2022-11-21 — End: 2022-12-21

## 2022-11-21 MED ORDER — RIFAXIMIN 550 MG TABLET
ORAL_TABLET | Freq: Two times a day (BID) | ORAL | 1 refills | 90 days | Status: CP
Start: 2022-11-21 — End: ?
  Filled 2022-11-24: qty 180, 90d supply, fill #0

## 2022-11-21 NOTE — Unmapped (Signed)
Received medication refill request via IB for Xifaximin.    Last filled: 08/09/2022. LCV 09/05/2022. Labs done 09/05/2022- WNL's. Refilled for 180 day supply with 1 refill.

## 2022-11-21 NOTE — Unmapped (Signed)
[facility] Refill Coordination Note    Specialty Medication(s) to be Shipped:   Infectious Disease: Xifaxan    Other medication(s) to be shipped: No additional medications requested for fill at this time     Harold Richards, DOB: 06-13-1956  Phone: [phone] (home)       All above HIPAA information was verified with patient.     Was a Nurse, learning disability used for this call? No    Completed refill call assessment today to schedule patient's medication shipment from the [facility] [phone]).  All relevant notes have been reviewed.     Specialty medication(s) and dose(s) confirmed: Regimen is correct and unchanged.   Changes to medications: Harold Richards reports no changes at this time.  Changes to insurance: No  New side effects reported not previously addressed with a pharmacist or physician: None reported  Questions for the pharmacist: No    Confirmed patient received a Conservation officer, historic buildings and a Surveyor, mining with first shipment. The patient will receive a drug information handout for each medication shipped and additional FDA Medication Guides as required.       DISEASE/MEDICATION-SPECIFIC INFORMATION        N/A    SPECIALTY MEDICATION ADHERENCE     Medication Adherence    Patient reported X missed doses in the last month: 2  Specialty Medication: Xifaxan  550mg   Patient is on additional specialty medications: No  Patient is on more than two specialty medications: No  Any gaps in refill history greater than 2 weeks in the last 3 months: no  Demonstrates understanding of importance of adherence: yes              Were doses missed due to medication being on hold? No    Xifaxan 550 mg:   3 days  of medicine on hand       REFERRAL TO PHARMACIST     Referral to the pharmacist: Not needed      [facility]     Shipping address confirmed in Epic.     Delivery Scheduled: Yes, Expected medication delivery date: 11/24/22 .     Medication will be delivered via Same Day Courier to the prescription address in Epic WAM.    Harold Richards   [facility] Specialty Technician

## 2022-12-08 DIAGNOSIS — J432 Centrilobular emphysema: Principal | ICD-10-CM

## 2022-12-08 MED ORDER — TRELEGY ELLIPTA 100 MCG-62.5 MCG-25 MCG POWDER FOR INHALATION
Freq: Every day | RESPIRATORY_TRACT | 3 refills | 90 days | Status: CP
Start: 2022-12-08 — End: 2023-12-08

## 2022-12-08 NOTE — Unmapped (Signed)
Requested Prescriptions     Pending Prescriptions Disp Refills    fluticasone-umeclidin-vilanter (TRELEGY ELLIPTA) 100-62.5-25 mcg inhaler 90 each 3     Sig: Inhale 1 puff daily.

## 2022-12-21 MED ORDER — BUPRENORPHINE 8 MG-NALOXONE 2 MG SUBLINGUAL FILM
ORAL_FILM | Freq: Two times a day (BID) | SUBLINGUAL | 0 refills | 30 days | Status: CP
Start: 2022-12-21 — End: 2023-01-20

## 2022-12-27 ENCOUNTER — Ambulatory Visit: Admit: 2022-12-27 | Payer: PRIVATE HEALTH INSURANCE | Attending: Radiation Oncology | Primary: Radiation Oncology

## 2022-12-27 ENCOUNTER — Ambulatory Visit: Admit: 2022-12-27 | Discharge: 2022-12-28 | Payer: PRIVATE HEALTH INSURANCE

## 2022-12-27 DIAGNOSIS — C61 Malignant neoplasm of prostate: Principal | ICD-10-CM

## 2022-12-27 LAB — PSA: PROSTATE SPECIFIC ANTIGEN: <0.04 ng/mL (ref 0.00–4.00)

## 2022-12-27 MED ORDER — TAMSULOSIN 0.4 MG CAPSULE
ORAL_CAPSULE | Freq: Every day | ORAL | 3 refills | 90 days | Status: CP
Start: 2022-12-27 — End: ?

## 2022-12-27 NOTE — Unmapped (Signed)
Occasional abdominal cramping. No other bowel issues.    Urinary flow fairly easy. Urinates x2-3 times nights and 9-12 during the day. No pain or burning. Occasional urgency. Occasional leakage.    Taking Flomax as prescribed.

## 2022-12-27 NOTE — Unmapped (Signed)
RADIATION ONCOLOGY FOLLOW-UP VISIT NOTE     Encounter Date: 12/27/2022  Patient Name: Harold Richards  Medical Record Number: [id number]    DIAGNOSIS:  67 y.o. male with high risk prostate cancer, cT1c, Gleason 4+4=8 in 2/12 cores, PSA 4.59 s/p external beam RT (45Gy) with brachytherapy boost (brachy done 11/05/2018)    DURATION SINCE COMPLETION OF RADIOTHERAPY:  4 years, 2 months (brachy on 11/05/2018)    Lupron #1 (3 month injection) 07/17/2018  Lupron #2 (3 month injection) 10/16/2018  Lupron #3 (3 month injection) 01/16/2019  Lupron #4 (1 month injection) 04/18/2019  Eligard #5 (3 month injection) 05/29/2019  Eligard #6 (3 month injection) 08/28/2019  Eligard #7 (3 month injection) 11/27/2019    ASSESSMENT:  Disease Status: No Evidence of Disease (NED).  PSA today  <0.04    RECOMMENDATIONS:  PSA today is <0.04 no evidence of disease  ADT:  Completed- last injection 11/27/2019  Hot flashes:  Resolved- suspect his testosterone has recovered  GU: Doing fine on flomax once daily- will switch to at bedtime to see if this helps with nocturia. No further issues with hematuria (had small amount of hematuria in early 2022 but cystoscopy was negative for bladder changes/masses- may be related to kidney stones)  GI:  No issues since RT- has some baseline issues related to lactulose use. Colonoscopy last done 08/2021.  FOLLOW-UP:  He will return in 6 months for routine follow-up and PSA check    INTERVAL HISTORY:    Doing very well today without any major issues from before.  Urination is stable.  Some mild daytime urgency but good flow, no frequency, no dysuria.  Still having nocturia 2-3x and feels like he has incomplete emptying sometimes.  Taking flomax once daily in the morning.  No more hematuria/kidney stone issues.  No Gi issues including pain, bleeding.  Some urgency and occasional loose stools related to medications.  Enjoying retirement- spent 2 weeks with his grandkids in [place] recently and looking forward to taking the youngest grandkid to [place] in 2025.    Baseline  General: Very fatigued. No ED  Urinary: On flomax, post-void residual 0 cc. Has frequency, nocturia 2-3x/night, no urgency, hematuria, dysuria.   GI: Denies loose stools, rectal pain, or bleeding.   Colonoscopy: Done 10/2015, found polyp.  Repeat in 5 years.    REVIEW OF SYSTEMS:  A comprehensive review of 10 systems was negative except for pertinent positives noted in HPI.    PAST MEDICAL HISTORY/FAMILY HISTORY/SOCIAL HISTORY:  Reviewed in EPIC    ALLERGIES/MEDICATIONS:  Reviewed in EPIC    PHYSICAL EXAM:  Vital Signs for this encounter:   BP 122/70  - Pulse 67  - Temp 36.2 ??C (97.1 ??F) (Temporal)  - Resp 17  - Wt 53.2 kg (117 lb 3.2 oz)  - SpO2 97%  - BMI 18.92 kg/m??   Karnofsky/Lansky Performance Status: 90,  Able to carry on normal activity; minor signs or symptoms of disease (ECOG equivalent 0)  General:   No acute distress, alert and oriented X 4   Head: Normocephalic, without obvious abnormality, atraumatic  Eyes: EOMI, no scleral icterus  Lungs: Normal work of breathing  Abdomen: non-distended  Extremities: extremities normal, atraumatic, no cyanosis or edema  Lymph nodes: No palpable supraclavicular or cervical lymphadenopathy  Neurologic: Grossly normal   Rectal: Deferred      RADIOLOGY:  No new imaging to review    Labs:    PSA   Date Value Ref Range Status  12/27/2022 <0.04 0.00 - 4.00 ng/mL Final   06/28/2022 <0.04 0.00 - 4.00 ng/mL Final   12/23/2021 <0.04 0.00 - 4.00 ng/mL Final   11/05/2021 0.06 0.00 - 4.00 ng/mL Final   06/23/2021 <0.04 0.00 - 4.00 ng/mL Final   03/23/2021 <0.04 0.00 - 4.00 ng/mL Final   12/15/2020 <0.04 0.00 - 4.00 ng/mL Final   09/15/2020 <0.04 0.00 - 4.00 ng/mL Final   05/28/2020 <0.04 0.00 - 4.00 ng/mL Final   02/27/2020 <0.04 0.00 - 4.00 ng/mL Final   08/28/2019 <0.10 0.00 - 4.00 ng/mL Final   01/16/2019 <0.10 0.00 - 4.00 ng/mL Final   05/18/2018 4.59 (H) 0.00 - 4.00 ng/mL Final   10/26/2017 3.88 0.00 - 4.00 ng/mL Final   07/27/2017 4.15 (H) 0.00 - 4.00 ng/mL Final       Rayetta Humphrey, MD  Assistant Professor  [facility] Dept of Radiation Oncology  12/27/2022

## 2023-01-17 ENCOUNTER — Telehealth: Admit: 2023-01-17 | Discharge: 2023-01-18 | Payer: PRIVATE HEALTH INSURANCE

## 2023-01-17 MED ORDER — MIRTAZAPINE 15 MG TABLET
ORAL_TABLET | ORAL | 0 refills | 67 days | Status: CP
Start: 2023-01-17 — End: 2023-03-25

## 2023-01-17 NOTE — Unmapped (Signed)
Patient Harold Richards: is stable on maintenance therapy imcmatmanagementplan1: 2 month follow up, next visit in person    PRESCRIPTIONS GIVEN TODAY:  No orders of the defined types were placed in this encounter.     - Benefits of treatment outweigh the risks  - Utox today: will be positive for: will complete at next visit  - PDMP reviewed: PDMP states no fill in May.  Reports two partial fills in April.   - Next appt: 03/31/23 at 8:20am  - Substance Abuse Counseling:  none  - Does pt have naloxone at home and know how to use it?  yes  - Labs needed today? none    No results found for: HIV12AB, HIVAGAB  Lab Results   Component Value Date    HEPAIGG Nonreactive 07/22/2015    HEPBCAB Reactive (A) 07/22/2015    HEPCAB Positive (A) 03/05/2014     Lab Results   Component Value Date    ALKPHOS 41 (L) 09/05/2022    BILITOT 0.2 (L) 09/05/2022    BILIDIR <0.10 12/23/2020    PROT 7.0 09/05/2022    ALBUMIN 4.1 09/05/2022    ALT 14 09/05/2022    AST 27 09/05/2022     No results found for: PREGTESTUR, PREGSERUM, HCG, HCGQUANT    - Suboxone treatment agreement:  has been signed by patient and scanned into record        Orders:    buprenorphine-naloxone (SUBOXONE) 8-2 mg sublingual film; Place 1 Film (8 mg of buprenorphine total) under the tongue two (2) times a day.    buprenorphine-naloxone (SUBOXONE) 8-2 mg sublingual film; Place 1 Film (8 mg of buprenorphine total) under the tongue two (2) times a day.

## 2023-01-17 NOTE — Unmapped (Signed)
[facility]     Type of visit:  face to face    Reason for visit: Follow up    Questions / Concerns that need to be addressed:  none    General Consent to Treat (GCT) for non-epic video visits only: Verbal consent      Hypertension:  Have blood pressure cuff at home?: yes- wrist cuff  Regularly checking blood pressure?: no    PTHomeBP       Allergies reviewed: Yes    Medication reviewed: Yes  Pended refills? No    HCDM reviewed and updated in Epic:    We are working to make sure all of our patients??? wishes are updated in Epic and part of that is documenting a Environmental health practitioner for each patient  A Health Care Decision Maker is someone you choose who can make health care decisions for you if you are not able - who would you most want to do this for you????  is already up to date.    HCDM (patient stated preference): Pamala Duffel - Spouse - [phone]    HCDM, First Alternate: Gross,Christina - Daughter - [phone]    HCDM, Second Alternate: Joaquin Bend - Daughter - [phone]          COVID-19 Vaccine Summary  Which COVID-19 Vaccine was administered  Moderna  Type:  Dates Given:  05/31/2022         Immunization History   Administered Date(s) Administered    COVID-19 VAC,BIVALENT,MODERNA(BLUE CAP) 06/03/2021    COVID-19 VACCINE,MRNA(MODERNA)(PF) 10/07/2019, 11/05/2019, 06/21/2020, 12/01/2020    Covid-19 Vac, (30yr+) (Spikevax) Monovalent Xbb.1.5 Moder  05/31/2022    Hepatitis A (Adult) 07/28/2016, 01/26/2017    Hepatitis A Vaccine Pediatric / Adolescent 2 Dose IM 03/05/2014    Influenza Vaccine Quad (IIV4 W/PRESERV) 76MO+ 06/01/2017, 05/12/2021    Influenza Vaccine Quad(IM)6 MO-Adult(PF) 06/02/2014, 06/16/2016, 04/19/2019, 05/31/2022    Influenza Virus Vaccine, unspecified formulation 05/18/2017, 05/22/2018, 05/24/2018, 05/07/2020, 05/25/2020    PNEUMOCOCCAL POLYSACCHARIDE 23-VALENT 03/05/2014    SHINGRIX-ZOSTER VACCINE (HZV),RECOMBINANT,ADJUVANTED(IM) 09/05/2022    TdaP 04/19/2019       __________________________________________________________________________________________    SCREENINGS COMPLETED IN FLOWSHEETS    HARK Screening       AUDIT       PHQ2       PHQ9          P4 Suicidality Screener                GAD7       COPD Assessment       Falls Risk

## 2023-01-17 NOTE — Unmapped (Signed)
I wrote prescriptions for suboxone to fill 01/30/23 and 03/01/23.   I will see you back in clinic in 03/31/23 at 8:20am.     Medication changes:  - start mirtazapine as prescribed for sleep.  Take about 1 hour before bedtime.

## 2023-01-17 NOTE — Unmapped (Signed)
Followed by Rad onc.   Lab Results   Component Value Date    PSA <0.04 12/27/2022   PSA has remained undetectable.   Continue follow-up with Rad On q6 months as scheduled.  Can discuss with Dr. Carles Collet transfer of surveillance care to me.

## 2023-01-17 NOTE — Unmapped (Signed)
Worse in the past year and changes have been tapering off mirtazapine.  Will restart and titrate up to 15mg  after 1 week.   Will monitor appetite and weight while on.  If not effective, will consider trazodone  Reviewed healthy sleep hygiene  Orders:    mirtazapine (REMERON) 15 MG tablet; Take 0.5 tablets (7.5 mg total) by mouth nightly for 7 days, THEN 1 tablet (15 mg total) nightly.

## 2023-01-17 NOTE — Unmapped (Signed)
[facility]  Established Patient - Video Visit    This visit is conducted via video conferencing.    Contact Information  Person Contacted: Patient  Contact Phone number: [phone] (home)   Is there someone else in the room? Yes. What is your relationship? spouse. Do you want this person here for the visit? yes.  Patient agreed to a video visit    Harold Richards is a 67 y.o. male  participating in a video visit.    Reason for visit:  insomnia    Subjective:      Harold Richards is a 67 y.o. male who presents for OUD follow up:     Since our last visit:  doesn't remember date of last fill.  Discuss that I don't see a fill in May. Reports he remembers getting two partial fills, but thought this was in May.        - Pt is currently taking buprenorphine/naloxone SL film at a dose of 8mg  BID.  - Cravings? none  - Side effects? none  - Withdrawal symptoms?None  - Any medication left over today? yes  - Any non-prescribed substance use since last visit?  none  - Attending: No therapy/meetings currently        Other medical issues:  Had recent trips to [facility] and [facility].     Mild confusion  Not sleeping well at night. States he onlly get a couple of hours of sleep at night. Usually tries to go to bed around 9-9:30pm.  Can fall asleep, but 30-40 mins wakes back up, goes back down stairs to do things, feels really tired then goes back to bed.  Got camera and awake every hour. Sometimes naps during the day, but used to take more naps.  These sleep issues have been going on for at least a year.  > 1 year ago would sleep about 5-6 hours and get up 1-2 hours/night to sleep.     Weight 116lbs at home    I have reviewed the problem list, medications, and allergies and have updated/reconciled them if needed.    Objective:  As part of this Video Visit, no in-person exam was conducted.  Video interaction permitted the following observations.    GEN: No acute distress.   SKIN: Color is normal. No rashes.  PSYCH: Appropriate affect, normal mood  RESP: Normal work of breathing, no retractions      Assessment & Plan:  No diagnosis found.    Assessment & Plan  Prostate cancer (CMS-HCC)  Followed by Rad onc.   Lab Results   Component Value Date    PSA <0.04 12/27/2022   PSA has remained undetectable.   Continue follow-up with Rad On q6 months as scheduled.  Can discuss with Dr. Carles Collet transfer of surveillance care to me.          Opioid use disorder, mild, in sustained remission (CMS-HCC)  Patient IMCMATSTABILITY: is stable on maintenance therapy imcmatmanagementplan1: 2 month follow up, next visit in person    PRESCRIPTIONS GIVEN TODAY:  No orders of the defined types were placed in this encounter.     - Benefits of treatment outweigh the risks  - Utox today: will be positive for: will complete at next visit  - PDMP reviewed:  PDMP states no fill in May.  Reports two partial fills in April.   - Next appt: 03/31/23 at 8:20am  - Substance Abuse Counseling:  none  - Does pt have naloxone at home  and know how to use it?  yes  - Labs needed today? none    No results found for: HIV12AB, HIVAGAB  Lab Results   Component Value Date    HEPAIGG Nonreactive 07/22/2015    HEPBCAB Reactive (A) 07/22/2015    HEPCAB Positive (A) 03/05/2014     Lab Results   Component Value Date    ALKPHOS 41 (L) 09/05/2022    BILITOT 0.2 (L) 09/05/2022    BILIDIR <0.10 12/23/2020    PROT 7.0 09/05/2022    ALBUMIN 4.1 09/05/2022    ALT 14 09/05/2022    AST 27 09/05/2022     No results found for: PREGTESTUR, PREGSERUM, HCG, HCGQUANT    - Suboxone treatment agreement:  has been signed by patient and scanned into record        Orders:    buprenorphine-naloxone (SUBOXONE) 8-2 mg sublingual film; Place 1 Film (8 mg of buprenorphine total) under the tongue two (2) times a day.    buprenorphine-naloxone (SUBOXONE) 8-2 mg sublingual film; Place 1 Film (8 mg of buprenorphine total) under the tongue two (2) times a day.    Cirrhosis (CMS-HCC)  From prior hx of Hep C s/p successful treatment. Followed by [facility].   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   Follow-up with GI as scheduled           Other insomnia  Worse in the past year and changes have been tapering off mirtazapine.  Will restart and titrate up to 15mg  after 1 week.   Will monitor appetite and weight while on.  If not effective, will consider trazodone  Reviewed healthy sleep hygiene  Orders:    mirtazapine (REMERON) 15 MG tablet; Take 0.5 tablets (7.5 mg total) by mouth nightly for 7 days, THEN 1 tablet (15 mg total) nightly.              Return in about 2 months (around 03/31/2023) for 8:20am, In-person (already confirmed date and time with patient).           The patient reports they are physically located in [place] and is currently: at home. I conducted a audio/video visit. I spent  39m 58s on the video call with the patient. I spent an additional 6 minutes on pre- and post-visit activities on the date of service .

## 2023-01-17 NOTE — Unmapped (Signed)
From prior hx of Hep C s/p successful treatment. Followed by [facility] GI.   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   Follow-up with GI as scheduled

## 2023-01-30 MED ORDER — BUPRENORPHINE 8 MG-NALOXONE 2 MG SUBLINGUAL FILM
ORAL_FILM | Freq: Two times a day (BID) | SUBLINGUAL | 0 refills | 30 days | Status: CP
Start: 2023-01-30 — End: 2023-03-01

## 2023-02-01 NOTE — Unmapped (Signed)
[facility] Refill Coordination Note    Harold Richards, Macy: 07-18-1956  Phone: [phone] (home)       All above HIPAA information was verified with patient.         02/01/2023     2:19 PM   Specialty Rx Medication Refill Questionnaire   Which Medications would you like refilled and shipped? Xifican   Please list all current allergies: Penicillin, bicalumate   Have you missed any doses in the last 30 days? No   Have you had any changes to your medication(s) since your last refill? No   How many days remaining of each medication do you have at home? 22   Have you experienced any side effects in the last 30 days? No   Please enter the full address (street address, city, state, zip code) where you would like your medication(s) to be delivered to. [address] [place], [place] [address]   Please specify on which day you would like your medication(s) to arrive. Note: if you need your medication(s) within 3 days, please call the pharmacy to schedule your order at [phone]  02/08/2023   Has your insurance changed since your last refill? No   Would you like a pharmacist to call you to discuss your medication(s)? No   Do you require a signature for your package? (Note: if we are billing Medicare Part B or your order contains a controlled substance, we will require a signature) No         Completed refill call assessment today to schedule patient's medication shipment from the [facility] [phone]).  All relevant notes have been reviewed.       Confirmed patient received a Conservation officer, historic buildings and a Surveyor, mining with first shipment. The patient will receive a drug information handout for each medication shipped and additional FDA Medication Guides as required.         REFERRAL TO PHARMACIST     Referral to the pharmacist: Not needed      [facility]     Shipping address confirmed in Epic.     Delivery Scheduled: Yes, Expected medication delivery date: 02/08/2023. Medication will be delivered via Next Day Courier to the prescription address in Epic WAM.    Kerby Less   [facility] Specialty Technician

## 2023-02-07 DIAGNOSIS — G9340 Encephalopathy, unspecified: Principal | ICD-10-CM

## 2023-02-07 MED FILL — XIFAXAN 550 MG TABLET: ORAL | 90 days supply | Qty: 180 | Fill #1

## 2023-03-01 MED ORDER — BUPRENORPHINE 8 MG-NALOXONE 2 MG SUBLINGUAL FILM
ORAL_FILM | Freq: Two times a day (BID) | SUBLINGUAL | 0 refills | 30 days | Status: CP
Start: 2023-03-01 — End: 2023-03-31

## 2023-03-06 ENCOUNTER — Ambulatory Visit: Admit: 2023-03-06 | Discharge: 2023-03-07 | Payer: PRIVATE HEALTH INSURANCE

## 2023-03-06 DIAGNOSIS — R5383 Other fatigue: Principal | ICD-10-CM

## 2023-03-06 DIAGNOSIS — K7460 Unspecified cirrhosis of liver: Principal | ICD-10-CM

## 2023-03-06 DIAGNOSIS — K7682 Hepatic encephalopathy (CMS-HCC): Principal | ICD-10-CM

## 2023-03-06 LAB — COMPREHENSIVE METABOLIC PANEL
ALBUMIN: 3.7 g/dL (ref 3.4–5.0)
ALKALINE PHOSPHATASE: 49 U/L (ref 46–116)
ALT (SGPT): 18 U/L (ref 10–49)
ANION GAP: 4 mmol/L — ABNORMAL LOW (ref 5–14)
AST (SGOT): 26 U/L (ref <=34)
BILIRUBIN TOTAL: 0.2 mg/dL — ABNORMAL LOW (ref 0.3–1.2)
BLOOD UREA NITROGEN: 18 mg/dL (ref 9–23)
BUN / CREAT RATIO: 20
CALCIUM: 9.4 mg/dL (ref 8.7–10.4)
CHLORIDE: 111 mmol/L — ABNORMAL HIGH (ref 98–107)
CO2: 28 mmol/L (ref 20.0–31.0)
CREATININE: 0.9 mg/dL
EGFR CKD-EPI (2021) MALE: >90 mL/min/{1.73_m2} (ref >=60)
GLUCOSE RANDOM: 114 mg/dL (ref 70–179)
POTASSIUM: 3.8 mmol/L (ref 3.4–4.8)
PROTEIN TOTAL: 6.6 g/dL (ref 5.7–8.2)
SODIUM: 143 mmol/L (ref 135–145)

## 2023-03-06 LAB — PROTIME-INR
INR: 1.2
PROTIME: 13.4 s — ABNORMAL HIGH (ref 9.9–12.6)

## 2023-03-06 LAB — CBC
HEMATOCRIT: 36.8 % — ABNORMAL LOW (ref 39.0–48.0)
HEMOGLOBIN: 12.5 g/dL — ABNORMAL LOW (ref 12.9–16.5)
MEAN CORPUSCULAR HEMOGLOBIN CONC: 33.9 g/dL (ref 32.0–36.0)
MEAN CORPUSCULAR HEMOGLOBIN: 36.1 pg — ABNORMAL HIGH (ref 25.9–32.4)
MEAN CORPUSCULAR VOLUME: 106.2 fL — ABNORMAL HIGH (ref 77.6–95.7)
MEAN PLATELET VOLUME: 7 fL (ref 6.8–10.7)
PLATELET COUNT: 198 10*9/L (ref 150–450)
RED BLOOD CELL COUNT: 3.46 10*12/L — ABNORMAL LOW (ref 4.26–5.60)
RED CELL DISTRIBUTION WIDTH: 12.5 % (ref 12.2–15.2)
WBC ADJUSTED: 5 10*9/L (ref 3.6–11.2)

## 2023-03-06 LAB — AFP TUMOR MARKER: AFP-TUMOR MARKER: <2 ng/mL (ref <=8)

## 2023-03-06 LAB — TSH: THYROID STIMULATING HORMONE: 1.072 u[IU]/mL (ref 0.550–4.780)

## 2023-03-06 MED ORDER — LACTULOSE 10 GRAM/15 ML ORAL SOLUTION
Freq: Two times a day (BID) | ORAL | 6 refills | 30 days | Status: CP
Start: 2023-03-06 — End: ?

## 2023-03-06 NOTE — Unmapped (Signed)
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 HCV treatment  ~ 2016    Presentation of Current Illness:    [name] is a 67 y.o. pleasant Caucasian gentleman who presents today for follow up care. 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 Cured of HCV in 2016 s/p Harvoni x 12 weeks. He was treatment naive with genotype 1a. History of recreational drug use (cocaine IV, intranasal and crack), three tattoos with one being homemade, and incarceration. Fibroscan 20 kPa consistent with F4 (cirrhosis). PMH of prostate cancer (high risk prostate cancer, cT1c, Gleason 4+4=8 in 2/12 cores, PSA 4.59 s/p external beam RT (45Gy) with brachytherapy boost (brachy done 11/05/2018), OSA with overlap COPD, basal cell carcinoma, chronic fungal infection of maxillary sinus, pulmonary nodules, nicotine dependency, chronic headaches, and renal calculus. More recently he has been diagnosed with neurocognitive impairment and has been following with neurology. Was on remeron and aricept, but is no longer taking.       Interval hx; LCV 08/2022. No hospitalizations or ED visits. No HE (well controlled with lactulose/ xifaxan). BRBPR on. Toilet paper not constipated. Still drinking sodas. Did have internal hems on colon. Cut back on smoking. No alcohol no illicit.     Allergies:   Allergies   Allergen Reactions    Bicalutamide Rash    Penicillins Rash    Rocephin [Ceftriaxone] Rash     Patient received in Emergency Room and reported rash to stomach and back several hours later.      Medications:  Current Outpatient Medications   Medication Sig Dispense Refill    albuterol HFA 90 mcg/actuation inhaler Inhale 2 puffs every four (4) hours as needed for wheezing. 8 g 3    aspirin (ECOTRIN) 81 MG tablet Take 1 tablet (81 mg total) by mouth daily.      atorvastatin (LIPITOR) 20 MG tablet Take 1 tablet (20 mg total) by mouth nightly. 90 tablet 3    buprenorphine-naloxone (SUBOXONE) 8-2 mg sublingual film Place 1 Film (8 mg of buprenorphine total) under the tongue two (2) times a day. 60 Film 0    cholecalciferol, vitamin D3-25 mcg, 1,000 unit,, 25 mcg (1,000 unit) capsule Take 1 capsule (25 mcg total) by mouth daily.      fluticasone-umeclidin-vilanter (TRELEGY ELLIPTA) 100-62.5-25 mcg inhaler Inhale 1 puff daily. 90 each 3    lactulose 10 gram/15 mL solution Take 15 mL (10 g total) by mouth Two (2) times a day. 900 mL 6    loratadine (CLARITIN) 10 mg tablet Take 1 tablet (10 mg total) by mouth daily.      mirtazapine (REMERON) 15 MG tablet Take 0.5 tablets (7.5 mg total) by mouth nightly for 7 days, THEN 1 tablet (15 mg total) nightly. 64 tablet 0    naloxone (NARCAN) 4 mg nasal spray One spray in either nostril once for known/suspected opioid overdose. May repeat every 2-3 minutes in alternating nostril til EMS arrives 2 each PRN    rifAXIMin (XIFAXAN) 550 mg Tab Take 1 tablet (550 mg total) by mouth Two (2) times a day. 180 tablet 1    sildenafiL (VIAGRA) 100 MG tablet Take 1 tablet (100 mg total) by mouth daily as needed for erectile dysfunction. 30 tablet 2    tamsulosin (FLOMAX) 0.4 mg capsule Take 1 capsule (0.4 mg total) by mouth daily. 90 capsule  3    furosemide (LASIX) 20 MG tablet Take 1 tablet (20 mg total) by mouth daily as needed for swelling. 30 tablet 6     No current facility-administered medications for this visit.     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     Resolved Ambulatory Problems     Diagnosis Date Noted    Chest pain 02/07/2013    GERD (gastroesophageal reflux disease) 02/08/2013    Skin lesion 02/12/2013    Sialolithiasis of submandibular gland 04/08/2015    Rib pain on left side 06/16/2016    Health care maintenance 06/16/2016    Nonspecific L Lung micronodules, repeat LDCT due 07/12/2017 07/12/2016    Atherosclerosis of coronary artery 08/16/2016    Actinic keratosis 02/01/2017    Elevated PSA 09/28/2017    Benign localized hyperplasia of prostate with urinary obstruction 09/28/2017    Abnormal prostate exam 09/28/2017    Hepatic encephalopathy (CMS-HCC) 03/14/2019    Community acquired pneumonia of right upper lobe of lung 04/07/2019    Opioid use disorder, mild, in sustained remission, on maintenance therapy (CMS-HCC) 02/25/2020     Past Medical History:   Diagnosis Date    Asthma     Basal cell carcinoma     Colon polyp     Enlarged prostate     Hx of substance abuse (CMS-HCC)     Infectious viral hepatitis     Osteoporosis     Skin cancer     Tobacco abuse 02/08/2013     Past Surgical History:   Procedure Laterality Date    CERVICAL FUSION      cage    PR COLONOSCOPY FLX DX W/COLLJ SPEC WHEN PFRMD N/A 09/01/2021    Procedure: COLONOSCOPY, FLEXIBLE, PROXIMAL TO SPLENIC FLEXURE; DIAGNOSTIC, W/WO COLLECTION SPECIMEN BY BRUSH OR WASH;  Surgeon: Liane Comber, MD;  Location: [facility] [place];  Service: Gastroenterology    PR COLSC FLX W/RMVL OF TUMOR POLYP LESION SNARE TQ N/A 10/20/2015    Procedure: COLONOSCOPY FLEX; W/REMOV TUMOR/LES BY SNARE;  Surgeon: Annie Paras, MD;  [facility];  Service: Gastroenterology    PR EXCISION SUBMAXILLARY GLAND Right 03/31/2015    Procedure: EXC SUBMANDIBULAR GLAND;  Surgeon: Lauralee Evener, MD;  Location: ASC OR [facility];  Service: ENT    PR REMV UPPER JAW-MAXILLECTOMY Left 07/22/2019    Procedure: MAXILLECTOMY; WO ORBITAL EXENTERATION;  Surgeon: Adam Swaziland Kimple, MD;  Location: ASC OR [facility];  Service: ENT    PR STEREOTACTIC COMP ASSIST PROC,CRANIAL,EXTRADURAL Left 07/22/2019    Procedure: STEREOTACTIC COMPUTER-ASSISTED (NAVIGATIONAL) PROCEDURE; CRANIAL, EXTRADURAL;  Surgeon: Adam Swaziland Kimple, MD;  Location: ASC OR [facility];  Service: ENT    PR UPPER GI ENDOSCOPY,DIAGNOSIS N/A 08/18/2017    Procedure: UGI ENDO, INCLUDE ESOPHAGUS, STOMACH, & DUODENUM &/OR JEJUNUM; DX W/WO  COLLECTION SPECIMN, BY BRUSH OR WASH;  Surgeon: Rona Ravens, MD;  [facility]: [facility];  Service: Gastroenterology    PR UPPER GI ENDOSCOPY,DIAGNOSIS N/A 11/08/2019    Procedure: UGI ENDO, INCLUDE ESOPHAGUS, STOMACH, & DUODENUM &/OR JEJUNUM; DX W/WO COLLECTION SPECIMN, BY BRUSH OR WASH;  Surgeon: Janyth Pupa, MD;  [facility]: [facility];  Service: Gastroenterology    SKIN BIOPSY       Family History   Problem Relation Age of Onset    Heart attack Father     Hypertension Father     Lung cancer Mother     Bone cancer Mother     Hypertension Mother     Hepatitis Sister     Melanoma Neg Hx       Social History     Socioeconomic History    Marital status: Married   Occupational History    Occupation: retired   Tobacco Use    Smoking status: Every Day     Current packs/day: 1.00     Average packs/day: 1 pack/day for 53.7 years (53.7 ttl pk-yrs)     Types: Cigarettes     Start date: 06/16/1969    Smokeless tobacco: Former     Types: Chew     Quit date: 2009   Vaping Use    Vaping status: Never Used   Substance and Sexual Activity    Alcohol use: No     Alcohol/week: 0.0 standard drinks of alcohol    Drug use: Not Currently     Types: Cocaine, IV, Marijuana     Comment: History of cocaine use years ago (intranasal, IV and crack), marijuana use.     Sexual activity: Yes     Partners: Female   Other Topics Concern    Do you use sunscreen? No    Tanning bed use? No Are you easily burned? No    Excessive sun exposure? Yes    Blistering sunburns? Yes     Social Determinants of Health     Financial Resource Strain: Low Risk  (07/26/2022)    Overall Financial Resource Strain (CARDIA)     Difficulty of Paying Living Expenses: Not hard at all   Food Insecurity: No Food Insecurity (07/26/2022)    Hunger Vital Sign     Worried About Running Out of Food in the Last Year: Never true     Ran Out of Food in the Last Year: Never true   Transportation Needs: No Transportation Needs (07/26/2022)    PRAPARE - Therapist, art (Medical): No     Lack of Transportation (Non-Medical): No     Physical Examination:   BP 128/58  - Pulse 64  - Temp 36.4 ??C (97.5 ??F) (Temporal)  - Ht 167.6 cm (5' 6)  - Wt 50.8 kg (112 lb)  - SpO2 95%  - BMI 18.08 kg/m??   Constitutional: in no apparent distress.   Eyes: Anicteric sclerae.   Cardiovascular: trace peripheral edema.   Gastrointestinal: Soft, nontender abdomen without hepatosplenomegaly, hernias or masses.   Neurologic: Nonfocal, no asterixis.   Psychiatric: Alert and oriented to person, place and time. Normal affect.   Temporal wasting         Laboratory Studies:    Office Visit on 03/06/2023   Component Date Value Ref Range Status    TSH 03/06/2023 1.072  0.550 - 4.780 uIU/mL Final    Sodium 03/06/2023 143  135 - 145  mmol/L Final    Potassium 03/06/2023 3.8  3.4 - 4.8 mmol/L Final    Chloride 03/06/2023 111 (H)  98 - 107 mmol/L Final    CO2 03/06/2023 28.0  20.0 - 31.0 mmol/L Final    Anion Gap 03/06/2023 4 (L)  5 - 14 mmol/L Final    BUN 03/06/2023 18  9 - 23 mg/dL Final    Creatinine 54/04/8118 0.90  0.73 - 1.18 mg/dL Final    BUN/Creatinine Ratio 03/06/2023 20   Final    eGFR CKD-EPI (2021) Male 03/06/2023 >90  >=60 mL/min/1.79m2 Final    Glucose 03/06/2023 114  70 - 179 mg/dL Final    Calcium 14/78/2956 9.4  8.7 - 10.4 mg/dL Final    Albumin 21/30/8657 3.7  3.4 - 5.0 g/dL Final    Total Protein 03/06/2023 6.6  5.7 - 8.2 g/dL Final    Total Bilirubin 03/06/2023 0.2 (L)  0.3 - 1.2 mg/dL Final    AST 84/69/6295 26  <=34 U/L Final    ALT 03/06/2023 18  10 - 49 U/L Final    Alkaline Phosphatase 03/06/2023 49  46 - 116 U/L Final    WBC 03/06/2023 5.0  3.6 - 11.2 10*9/L Final    RBC 03/06/2023 3.46 (L)  4.26 - 5.60 10*12/L Final    HGB 03/06/2023 12.5 (L)  12.9 - 16.5 g/dL Final    HCT 28/41/3244 36.8 (L)  39.0 - 48.0 % Final    MCV 03/06/2023 106.2 (H)  77.6 - 95.7 fL Final    MCH 03/06/2023 36.1 (H)  25.9 - 32.4 pg Final    MCHC 03/06/2023 33.9  32.0 - 36.0 g/dL Final    RDW 08/10/7251 12.5  12.2 - 15.2 % Final    MPV 03/06/2023 7.0  6.8 - 10.7 fL Final    Platelet 03/06/2023 198  150 - 450 10*9/L Final     Studies:   Fibroscan 06/16/2014: 20.0 kPa/F4  MRI abdomen 09/26/2022: multiple LR-3, cirrhosis      Assessment/Plan:     #Cirrhosis: decompensated with HE 2/2 HCV (cured in 2016). Diagnosed in 2015 on fibroscan. HCV cured in 2016 with Harvoni. MELD 8  -HE: Continue lactulose 15 ml bid and Xifaxan 550 mg bid  -HCC screening: MRI with many LR-3 lesions. 09/2022--> Due August 2024.   -Variceal Screening: EGD 11/2019 without varices. Repeat upper endoscopy not necessarily recommended if remains well compensated given PLT count >150 and fibroscan <20 kPa.  #BLE:  saw cards prescribed lasix.   #Anemia: If continues can c/s EGD to look for PHG. Colonoscopy with non bleeding internal hemorrhoids.   #fatigue: likely multifactorial ( liver disease, poor sleep hygiene)  --tsh  --limit soda  -cont remeron    Health maintenance:   --HBV: vaccinated  --HAV: vaccinated  --Colonoscopy: 2023--> 2028 follow up.         RTC in 6 months     It was my pleasure caring for this patient. Please reach out with any questions.     Reeves Forth. Kasondra Junod, ANP- [facility]  [facility]  [address]  [place] [address]  [phone]

## 2023-03-14 ENCOUNTER — Ambulatory Visit: Admit: 2023-03-14 | Discharge: 2023-03-15 | Payer: PRIVATE HEALTH INSURANCE

## 2023-03-14 DIAGNOSIS — Z85828 Personal history of other malignant neoplasm of skin: Principal | ICD-10-CM

## 2023-03-14 DIAGNOSIS — D229 Melanocytic nevi, unspecified: Principal | ICD-10-CM

## 2023-03-14 DIAGNOSIS — L821 Other seborrheic keratosis: Principal | ICD-10-CM

## 2023-03-14 DIAGNOSIS — D1801 Hemangioma of skin and subcutaneous tissue: Principal | ICD-10-CM

## 2023-03-14 DIAGNOSIS — D489 Neoplasm of uncertain behavior, unspecified: Principal | ICD-10-CM

## 2023-03-14 DIAGNOSIS — L570 Actinic keratosis: Principal | ICD-10-CM

## 2023-03-14 DIAGNOSIS — L814 Other melanin hyperpigmentation: Principal | ICD-10-CM

## 2023-03-14 NOTE — Unmapped (Signed)
Dermatology Note     Assessment and Plan:      Neoplasm(ia) of unspecified etiology:  - To help confirm diagnosis, biopsy/biopsies obtained today.   Biopsy (Shave) Procedure Note:   After R/B/A discussed (including scarring, pigment alteration, recurrence, or persistence of the lesion) and consent was obtained, the area was marked and photographed. Time Out verification of patient, procedure, and site was performed. When applicable, safety precautions based on patient's medical history or medication use and review of relevant images and results was also performed as part of the Time Out. Site was then prepped with alcohol, and anesthetized with lidocaine 2% with epinephrine. Biopsy(ies) performed using a shave technique. Hemostasis was achieved with pressure, aluminum chloride, Monsel's, and/or electrocautery. Area was dressed with petrolatum and bandage. Wound care instructions were provided. We will contact the patient with results when available. Patient agrees to be notified of results by MyChart - even if needs further management.   A) Location: L posterior neck, DDx: BCC vs SCC vs AK vs KA vs SK    Actinic Keratosis(es):   - Discussed premalignant nature of lesions and therefore recommended therapy.   - Patient agrees to cryotherapy. See procedure note for details.  Cryotherapy Procedure Note:   After R/B/A discussed (including scarring, pigment alteration, recurrence, or persistence of the lesion) and verbal consent was obtained, identified lesions were treated with liquid nitrogen x 1 ten second freeze-thaw cycle. The patient tolerated the procedure well and was instructed on post-procedure care.  Total AK's frozen: 7  Location and number: 2 scalp, 2 R dorsal arm, 3 left dorsal arm     Benign Lesions/ Findings:   Angioma(s)  Lentigo/Lentigines  Nevus/Nevi-Benign Appearing  Seborrheic Keratosis(es) - no irritation noted  - Reassurance provided regarding the benign appearance of lesions noted on exam today; no treatment is indicated in the absence of symptoms/changes.  - Reinforced importance of photoprotective strategies including liberal and frequent sunscreen use of a broad-spectrum SPF 30 or greater, use of protective clothing, and sun avoidance for prevention of cutaneous malignancy and photoaging.  Counseled patient on the importance of regular self-skin monitoring as well as routine clinical skin examinations as scheduled.     Personal history of non-melanoma skin cancer   - No evidence of recurrence at this time.  - Discussed maintaining vigilance and counseled on sun protection as above.    The patient was advised to call for an appointment should any new, changing, or symptomatic lesions develop.     RTC: Return in about 6 months (around 09/14/2023) for FBSE, Miedema. or sooner as needed   _________________________________________________________________      Chief Complaint     Chief Complaint   Patient presents with    Skin Check     Marks / spots on bilateral arms, face. head, neck - wants checked out.        HPI     Harold Richards is a 67 y.o. male who presents as a returning patient (last seen 10/15/2020) to Dermatology for a focused skin exam. Patient reports a few scaly spots on his arm he would like examined.     The patient denies any other new or changing lesions or areas of concern.     Pertinent Past Medical History     History of skin cancer as outlined below:    Problem List          Other    History of nonmelanoma skin cancer     Skin  Cancer History- Non-Melanoma Skin Cancer    Diagnosis Location Biopsy Date Treatment date Procedure Surgeon   [facility] R preauricular  05/2020 Mohs Varma   [facility]CC L cheek  05/2020 Mohs Varma   [facility] L preauricular  09/2020 Mohs Varma   [facility] L cheek  09/2020 Mohs Varma   [facility] L nose  09/2020 Mohs Varma             Possible history of the past of basal cell with Dr. Orson Aloe treated years ago             Family History:   Negative for melanoma    Past Medical History, Family History, Social History, Medication List, Allergies, and Problem List were reviewed in the rooming section of Epic.     ROS: Other than symptoms mentioned in the HPI, no fevers, chills, or other skin complaints    Physical Examination     GENERAL: Well-appearing male in no acute distress, resting comfortably.  NEURO: Alert and oriented, answers questions appropriately  SKIN (Full Skin Exam): Examination of the face, eyelids, lips, nose, ears, neck, chest, abdomen, back, arms, legs, hands, feet, palms, soles, nails was performed  - Actinic Keratosis(es): Scaly erythematous macule(s) on the 2 scalp, 2 R dorsal arm, 3 left dorsal arm   - Angioma(s): Scattered red vascular papule(s) on the scattered diffusely  - Lentigo/lentigines: Scattered pigmented macules that are tan to brown in color and are somewhat non-uniform in shape and concentrated in the sun-exposed areas of the face and upper extremities  - Nevus/nevi: Scattered well-demarcated, regular, pigmented macule(s) and/or papule(s) on the scattered diffusely  - Seborrheic Keratosis(es): Stuck-on appearing keratotic papule(s) on the scattered diffusely, none irritated with redness, crusting, edema, and/or partial avulsion  - Scaly erythematous plaque with hemorrhagic crust on posterior neck       All areas not commented on are within normal limits or unremarkable      (Approved Template 04/20/2020)

## 2023-03-14 NOTE — Unmapped (Signed)
Shave biopsy   A shave biopsy involves numbing a small area of your skin and then obtaining a sample to help [place] with proper diagnosis or skin condition. Biopsy results typically return in 7 to 14 days.    To care for the area: Leave the bandage in place until the morning after your procedure is performed. On a daily basis, carefully remove the bandage, then shower or wash as usual. Allow water to run over the site. Please do not scrub. Carefully dry the area, then apply ointment (some people develop an allergy to Neosporin, so we recommend Vaseline or Aquaphor). Cover the site with a fresh bandage. Should any bleeding occur, apply firm pressure for 15 minutes. The treated site will heal best if  a scab never forms (the wound heals by new skin cells traveling from the outside toward the middle-their journey is easier if no scab stands in their way).    Long-term care: the site will be more sensitive than your surrounding skin. Keep it covered, and remember to apply sunscreen every day to all your exposed skin. A scar may remain which is lighter or pinker than your normal skin. Your body will continue to improve your scar for up to one year.    Infection following this procedure is rare. However, if you are worried about the appearance of your site, contact your doctor. Complete healing may take up to one month. We have a physician on call at all times. If you have any concerns about the site, please call our clinic at [phone] Cryosurgery    Cryosurgery (???freezing???) uses liquid nitrogen to destroy certain types of skin lesions. Lowering the temperature of the lesion in a small area surrounding skin destroys the lesion. Immediately following cryosurgery, you will notice redness and swelling of the treatment area. Blistering or weeping may occur, lasting approximately one week which will then be followed by crusting. Most areas will heal completely in 10 to 14 days.    Wash the treated areas daily. Allow soap and water to run over the areas, but do not scrub. Should a scab or crust form, allow it to fall off on its own. Do not remove or pick at it. Application of an ointment  and a bandage may make you feel more comfortable, but it is not necessary. Some people develop an allergy to Neosporin, so we recommend that Vaseline or  Aquaphor be used.    The cryotherapy site will be more sensitive than your surrounding skin. Keep it covered, and remember to apply sunscreen every day to all your sun exposed skin. A scar may remain which is lighter or pinker than your normal skin. Your body will continue to improve your scar for up to one year; however a light-colored scar may remain.    Infection following cryotherapy is rare. However if you are worried about the appearance of the treated area, contact your doctor. We have a physician on call at all times. If you have any concerns about the site, please call our clinic at [phone]     [facility] releases most results to you as soon as they are available. Therefore, you may see some results before we do. Please give [place] 3 business days to review the tests and contact you by phone or through MyChart. If you are concerned that some results may be upsetting or confusing, you may wish to wait until we contact you before looking at the report in MyChart.   If you have  an urgent question, you can call our clinic. MyChart should not be used for urgent issues. Otherwise, we prefer that you wait 3 business days for [place] to contact you.    [facility] Dermatology Clinical Team

## 2023-03-23 DIAGNOSIS — G4709 Other insomnia: Principal | ICD-10-CM

## 2023-03-23 MED ORDER — MIRTAZAPINE 15 MG TABLET
ORAL_TABLET | 0 refills | 0 days
Start: 2023-03-23 — End: ?

## 2023-03-24 MED ORDER — MIRTAZAPINE 15 MG TABLET
ORAL_TABLET | 0 refills | 0 days | Status: CP
Start: 2023-03-24 — End: ?

## 2023-03-28 DIAGNOSIS — K7460 Unspecified cirrhosis of liver: Principal | ICD-10-CM

## 2023-03-31 ENCOUNTER — Ambulatory Visit: Admit: 2023-03-31 | Discharge: 2023-04-01 | Payer: PRIVATE HEALTH INSURANCE

## 2023-03-31 DIAGNOSIS — G4709 Other insomnia: Principal | ICD-10-CM

## 2023-03-31 DIAGNOSIS — K7469 Other cirrhosis of liver: Principal | ICD-10-CM

## 2023-03-31 DIAGNOSIS — F1111 Opioid abuse, in remission: Principal | ICD-10-CM

## 2023-03-31 LAB — TOXICOLOGY SCREEN, URINE
AMPHETAMINE SCREEN URINE: NEGATIVE
BARBITURATE SCREEN URINE: NEGATIVE
BENZODIAZEPINE SCREEN, URINE: NEGATIVE
BUPRENORPHINE, URINE SCREEN: POSITIVE — AB
CANNABINOID SCREEN URINE: NEGATIVE
COCAINE(METAB.)SCREEN, URINE: NEGATIVE
FENTANYL SCREEN, URINE: NEGATIVE
METHADONE SCREEN, URINE: NEGATIVE
OPIATE SCREEN URINE: NEGATIVE
OXYCODONE SCREEN URINE: NEGATIVE

## 2023-03-31 MED ORDER — MIRTAZAPINE 7.5 MG TABLET
ORAL_TABLET | Freq: Every evening | ORAL | 0 refills | 90 days | Status: CP
Start: 2023-03-31 — End: 2023-06-29

## 2023-03-31 MED ORDER — FUROSEMIDE 20 MG TABLET
ORAL_TABLET | Freq: Every day | ORAL | 6 refills | 30 days | Status: CP | PRN
Start: 2023-03-31 — End: 2023-10-27

## 2023-03-31 NOTE — Unmapped (Addendum)
Patient IMCMATSTABILITY: is stable on maintenance therapy imcmatmanagementplan1: 2 month follow up, next visit in person    PRESCRIPTIONS GIVEN TODAY:    Medications ordered during this encounter   Medications    furosemide (LASIX) 20 MG tablet     Sig: Take 1 tablet (20 mg total) by mouth daily as needed for swelling.     Dispense:  30 tablet     Refill:  6      - Benefits of treatment outweigh the risks  - Utox today: will be positive for: will complete at next visit  - PDMP reviewed: appropriate  - Next appt: Return in about 2 months (around 05/30/2023) for Video Visit at 8:20am.  - Substance Abuse Counseling:  none  - Does pt have naloxone at home and know how to use it?  yes  - Labs needed today? none    No results found for: HIV12AB, HIVAGAB  Lab Results   Component Value Date    HEPAIGG Nonreactive 07/22/2015    HEPBCAB Reactive (A) 07/22/2015    HEPCAB Positive (A) 03/05/2014     Lab Results   Component Value Date    ALKPHOS 49 03/06/2023    BILITOT 0.2 (L) 03/06/2023    BILIDIR <0.10 12/23/2020    PROT 6.6 03/06/2023    ALBUMIN 3.7 03/06/2023    ALT 18 03/06/2023    AST 26 03/06/2023     No results found for: PREGTESTUR, PREGSERUM, HCG, HCGQUANT    - Suboxone treatment agreement:  has been signed by patient and scanned into record  Orders:    Toxicology Screen, Urine    buprenorphine-naloxone (SUBOXONE) 8-2 mg sublingual film; Place 1 Film (8 mg of buprenorphine total) under the tongue two (2) times a day.    buprenorphine-naloxone (SUBOXONE) 8-2 mg sublingual film; Place 1 Film (8 mg of buprenorphine total) under the tongue two (2) times a day.

## 2023-03-31 NOTE — Unmapped (Addendum)
From prior hx of Hep C s/p successful treatment. Followed by [facility] GI.   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   Follow-up with GI as scheduled  Continue Lasix PRN.   Orders:    furosemide (LASIX) 20 MG tablet; Take 1 tablet (20 mg total) by mouth daily as needed for swelling.

## 2023-03-31 NOTE — Unmapped (Signed)
[facility] - Faculty Practice  Internal Medicine Clinic Visit    Reason for visit: MOUD, insomnia    A/P:    Assessment & Plan  Opioid use disorder, mild, in sustained remission (CMS-HCC)  Patient IMCMATSTABILITY: is stable on maintenance therapy imcmatmanagementplan1: 2 month follow up, next visit in person    PRESCRIPTIONS GIVEN TODAY:    Medications ordered during this encounter   Medications    furosemide (LASIX) 20 MG tablet     Sig: Take 1 tablet (20 mg total) by mouth daily as needed for swelling.     Dispense:  30 tablet     Refill:  6      - Benefits of treatment outweigh the risks  - Utox today: will be positive for: will complete at next visit  - PDMP reviewed:  appropriate  - Next appt: Return in about 2 months (around 05/30/2023) for Video Visit at 8:20am.  - Substance Abuse Counseling:  none  - Does pt have naloxone at home and know how to use it?  yes  - Labs needed today? none    No results found for: HIV12AB, HIVAGAB  Lab Results   Component Value Date    HEPAIGG Nonreactive 07/22/2015    HEPBCAB Reactive (A) 07/22/2015    HEPCAB Positive (A) 03/05/2014     Lab Results   Component Value Date    ALKPHOS 49 03/06/2023    BILITOT 0.2 (L) 03/06/2023    BILIDIR <0.10 12/23/2020    PROT 6.6 03/06/2023    ALBUMIN 3.7 03/06/2023    ALT 18 03/06/2023    AST 26 03/06/2023     No results found for: PREGTESTUR, PREGSERUM, HCG, HCGQUANT    - Suboxone treatment agreement:  has been signed by patient and scanned into record  Orders:    Toxicology Screen, Urine    buprenorphine-naloxone (SUBOXONE) 8-2 mg sublingual film; Place 1 Film (8 mg of buprenorphine total) under the tongue two (2) times a day.    buprenorphine-naloxone (SUBOXONE) 8-2 mg sublingual film; Place 1 Film (8 mg of buprenorphine total) under the tongue two (2) times a day.      Cirrhosis (CMS-HCC)  From prior hx of Hep C s/p successful treatment. Followed by [facility].   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   Follow-up with GI as scheduled  Continue Lasix PRN.   Orders:    furosemide (LASIX) 20 MG tablet; Take 1 tablet (20 mg total) by mouth daily as needed for swelling.    Other insomnia  Doing well with mirtazapine, but will stay at lower dose.  Monitor weight and appetite as he takes chronically  Orders:    mirtazapine (REMERON) 7.5 MG tablet; Take 1 tablet (7.5 mg total) by mouth nightly.         Return in about 2 months (around 05/30/2023) for Video Visit at 8:20am.        __________________________________________________________    HPI:    Started mirtazapine and does make him sleepy at night.  Has held   At first groggy the next day. Slept the whole night, ~8 hours, got up 1-2 hours to urinate.  Did increase the the whole tablet but got too sleepy    Breathing ok.  No coughing, SOB, wheezing.  Using inhaler everyday.     Takes Lasix 1-2x/week. Usually when ankles get a little more swollen.  __________________________________________________________        Medications:  Reviewed in EPIC  __________________________________________________________    Physical Exam:   Vital Signs:  Vitals:    03/31/23 0808   BP: 123/53   BP Site: L Arm   BP Position: Sitting   BP Cuff Size: Medium   Pulse: 69   Resp: 18   Temp: 36.3 ??C (97.4 ??F)   TempSrc: Temporal   SpO2: 94%   Weight: 52.6 kg (116 lb)   Height: 167.6 cm (5' 6)          PTHomeBP    Gen: Thin, stable appearing, NAD  Ext: No edema      PHQ-9 Score:     GAD-7 Score:       Medication adherence and barriers to the treatment plan have been addressed. Opportunities to optimize healthy behaviors have been discussed. Patient / caregiver voiced understanding.

## 2023-03-31 NOTE — Unmapped (Addendum)
Doing well with mirtazapine, but will stay at lower dose.  Monitor weight and appetite as he takes chronically  Orders:    mirtazapine (REMERON) 7.5 MG tablet; Take 1 tablet (7.5 mg total) by mouth nightly.

## 2023-04-18 MED ORDER — BUPRENORPHINE 8 MG-NALOXONE 2 MG SUBLINGUAL FILM
ORAL_FILM | Freq: Two times a day (BID) | SUBLINGUAL | 0 refills | 30 days | Status: CP
Start: 2023-04-18 — End: 2023-05-18

## 2023-04-23 DIAGNOSIS — G9340 Encephalopathy, unspecified: Principal | ICD-10-CM

## 2023-04-23 MED ORDER — XIFAXAN 550 MG TABLET
ORAL_TABLET | Freq: Two times a day (BID) | ORAL | 1 refills | 90 days
Start: 2023-04-23 — End: ?

## 2023-04-26 MED ORDER — RIFAXIMIN 550 MG TABLET
ORAL_TABLET | Freq: Two times a day (BID) | ORAL | 1 refills | 90 days | Status: CP
Start: 2023-04-26 — End: ?

## 2023-04-26 NOTE — Unmapped (Signed)
Refill request for Xifaxan    LCV 03/06/23 with labs    Will authorize refill at this time    [place], [place]

## 2023-05-01 NOTE — Unmapped (Signed)
[facility] Specialty and Home Delivery Pharmacy Refill Coordination Note    Harold Richards, [place]: 08/09/1955  Phone: [phone] (home)       All above HIPAA information was verified with patient.         04/30/2023     7:53 PM   Specialty Rx Medication Refill Questionnaire   Which Medications would you like refilled and shipped? Xifican   Please list all current allergies: Penicillin   Have you missed any doses in the last 30 days? No   Have you had any changes to your medication(s) since your last refill? No   How many days remaining of each medication do you have at home? 10   Have you experienced any side effects in the last 30 days? No   Please enter the full address (street address, city, state, zip code) where you would like your medication(s) to be delivered to. [address], [place], [place] [address]   Please specify on which day you would like your medication(s) to arrive. Note: if you need your medication(s) within 3 days, please call the pharmacy to schedule your order at [phone]  05/08/2023   Has your insurance changed since your last refill? No   Would you like a pharmacist to call you to discuss your medication(s)? No   Do you require a signature for your package? (Note: if we are billing Medicare Part B or your order contains a controlled substance, we will require a signature) No   Additional Comments: I go on Medicare on 05/09/2023         Completed refill call assessment today to schedule patient's medication shipment from the [facility] and Home Delivery Pharmacy [phone]).  All relevant notes have been reviewed.       Confirmed patient received a Conservation officer, historic buildings and a Surveyor, mining with first shipment. The patient will receive a drug information handout for each medication shipped and additional FDA Medication Guides as required.         REFERRAL TO PHARMACIST     Referral to the pharmacist: Not needed      [facility]     Shipping address confirmed in Epic.     Delivery Scheduled: Yes, Expected medication delivery date: 05/08/23.     Medication will be delivered via Same Day Courier to the prescription address in Epic WAM.    Kerby Less   [facility] Specialty and Home Delivery Pharmacy Specialty Technician

## 2023-05-12 MED ORDER — ONDANSETRON 4 MG DISINTEGRATING TABLET
ORAL_TABLET | Freq: Two times a day (BID) | 1 refills | 30 days | Status: CP | PRN
Start: 2023-05-12 — End: 2023-07-11

## 2023-05-18 DIAGNOSIS — G9340 Encephalopathy, unspecified: Principal | ICD-10-CM

## 2023-05-18 DIAGNOSIS — E785 Hyperlipidemia, unspecified: Principal | ICD-10-CM

## 2023-05-18 MED ORDER — ATORVASTATIN 20 MG TABLET
ORAL_TABLET | ORAL | 3 refills | 90 days | Status: CP
Start: 2023-05-18 — End: ?

## 2023-05-18 MED ORDER — BUPRENORPHINE 8 MG-NALOXONE 2 MG SUBLINGUAL FILM
ORAL_FILM | Freq: Two times a day (BID) | SUBLINGUAL | 0 refills | 30 days | Status: CP
Start: 2023-05-18 — End: 2023-06-17

## 2023-05-18 NOTE — Unmapped (Signed)
[facility] Specialty and Home Delivery Pharmacy Refill Coordination Note    Specialty Lite Medication(s) to be Shipped:   Xifaxan    Other medication(s) to be shipped: No additional medications requested for fill at this time     Mcallister Dalesio, DOB: 1956/05/01  Phone: [phone] (home)       All above HIPAA information was verified with patient's family member, wife.     Was a Nurse, learning disability used for this call? No    Changes to medications: Basheer reports no changes at this time.  Changes to insurance: No      REFERRAL TO PHARMACIST     Referral to the pharmacist: Not needed      [facility]     Shipping address confirmed in Epic.     Delivery Scheduled: Yes, Expected medication delivery date: 10/11.     Medication will be delivered via Same Day Courier to the prescription address in Epic WAM.    [facility] Pharm[facility] Specialty Technician

## 2023-05-19 NOTE — Unmapped (Signed)
Harold Richards 's Xifaxan shipment will be delayed as a result of MAPs seeking manufacture assistance.     I have reached out to the patient  at [phone]  and communicated the delay. We will call the patient back to reschedule the delivery upon resolution. We have not confirmed the new delivery date.

## 2023-05-22 NOTE — Unmapped (Signed)
Received refill request for Atorvastatin, this was refilled on 10/10 by Dr. Talbert Forest Pt's PCP.

## 2023-05-30 ENCOUNTER — Telehealth: Admit: 2023-05-30 | Discharge: 2023-05-31 | Payer: MEDICARE

## 2023-05-30 DIAGNOSIS — K7469 Other cirrhosis of liver: Principal | ICD-10-CM

## 2023-05-30 DIAGNOSIS — R110 Nausea: Principal | ICD-10-CM

## 2023-05-30 DIAGNOSIS — D649 Anemia, unspecified: Principal | ICD-10-CM

## 2023-05-30 DIAGNOSIS — F1111 Opioid abuse, in remission: Principal | ICD-10-CM

## 2023-05-30 MED ORDER — PANTOPRAZOLE 40 MG TABLET,DELAYED RELEASE
ORAL_TABLET | Freq: Every evening | ORAL | 0 refills | 60.00000 days | Status: CP
Start: 2023-05-30 — End: 2023-07-29

## 2023-05-30 NOTE — Unmapped (Addendum)
From prior hx of Hep C s/p successful treatment. Followed by [facility].   Notes some possible mild encephalopathy, but stable on Xifaxin and lactulose  Cost issue with Xifaxin for which he is trying to obtain MAP.  Will coordinate with [facility] if not able to obtain if should increase to lactulose to TID  Otherwise complicated by portal hypertensive gastropathy, no esophageal varices.   Follow-up with GI as scheduled  Continue Lasix PRN.   Orders:    Comprehensive Metabolic Panel

## 2023-05-30 NOTE — Unmapped (Signed)
[facility]  Established Patient - Video Visit    This visit is conducted via video conferencing.    Contact Information  Person Contacted: Patient  Contact Phone number: [phone] (home)   Is there someone else in the room? No.   Patient agreed to a video visit    [name] is a 67 y.o. male  participating in a video visit.    Reason for visit:  Nausea  MOUD    Subjective:  Nausea:  happens every morning when he wakes up for the past month.  Messaged GI provider and prescribed Zofran and takes every morning.  Doesn't have to take Zofran the rest of the day, but still has intermittent nausea throughout the day.  Eating makes it better.  No vomiting.  No stomach pain.  No diarrhea or constipation. No weight loss. No black colored stools that he thinks.      Awaiting MAP for Xifaxan.  Has 9-10 more pills  Takes lactulose 15mL BID.  Has BMs every day, at least 2-3.     Mr. [name] is a 67 y.o. male who presents for OUD follow up:     Since our last visit no issues with suboxone      - Pt is currently taking buprenorphine/naloxone SL film at a dose of 8mg  BID.  - Cravings? none  - Side effects? none  - Withdrawal symptoms?None  - Any medication left over today? yes  - Any non-prescribed substance use since last visit?  none  - Attending: No therapy/meetings currently      I have reviewed the problem list, medications, and allergies and have updated/reconciled them if needed.    Objective:  As part of this Video Visit, no in-person exam was conducted.  Video interaction permitted the following observations.    GEN: No acute distress.   SKIN: Color is normal. No rashes.  PSYCH: Appropriate affect, normal mood  RESP: Normal work of breathing, no retractions      Assessment & Plan:  Assessment & Plan  Nausea  Unknown etiology and is taking daily zofran with continued intermittent nausea throughout the day. Has hx of portal hypertensive gastropathy.  No signs of bleeding, but has not checked stools for melena.  Recommended that he observe color of stools and let me know if black colored.  No weight loss per his report but is of low weight at baseline.   Given improvement with meals,will empirically treat for PUD with PPI daily.  If improved will continue x 8 weeks. If not improved with therapy will order EGD and noted this to him, which he is in agreement about.  Orders:    pantoprazole (PROTONIX) 40 MG tablet; Take 1 tablet (40 mg total) by mouth nightly.    Anemia, unspecified type  Noted intermittently on prior labs.  Thought to be related to portal hypertensive gastropathy seen on prior EGD (last in 2021).   Orders:    CBC; Future    Cirrhosis (CMS-HCC)  From prior hx of Hep C s/p successful treatment. Followed by [facility].   Notes some possible mild encephalopathy, but stable on Xifaxin and lactulose  Cost issue with Xifaxin for which he is trying to obtain MAP.  Will coordinate with [facility] if not able to obtain if should increase to lactulose to TID  Otherwise complicated by portal hypertensive gastropathy, no esophageal varices.   Follow-up with GI as scheduled  Continue Lasix PRN.   Orders:  Comprehensive Metabolic Panel      Opioid use disorder, mild, in sustained remission (CMS-HCC)  Patient IMCMATSTABILITY: is stable on maintenance therapy imcmatmanagementplan1: 2 month follow up, next visit in person    PRESCRIPTIONS GIVEN TODAY:    Medications ordered during this encounter   Medications    pantoprazole (PROTONIX) 40 MG tablet     Sig: Take 1 tablet (40 mg total) by mouth nightly.     Dispense:  60 tablet     Refill:  0    buprenorphine-naloxone (SUBOXONE) 8-2 mg sublingual film     Sig: Place 1 Film (8 mg of buprenorphine total) under the tongue two (2) times a day.     Dispense:  60 Film     Refill:  0     NADEAN:[id number]    buprenorphine-naloxone (SUBOXONE) 8-2 mg sublingual film     Sig: Place 1 Film (8 mg of buprenorphine total) under the tongue two (2) times a day.     Dispense:  60 Film Refill:  0     NUUVOZ:[id number]      - Benefits of treatment outweigh the risks  - Utox today: will be positive for: will complete at next visit  - PDMP reviewed:  appropriate  - Next appt: Return in 6 weeks (on 07/14/2023) for In-person at 10am on 07/14/23.  - Substance Abuse Counseling:  none  - Does pt have naloxone at home and know how to use it?  yes  - Labs needed today? none    No results found for: HIV12AB, HIVAGAB  Lab Results   Component Value Date    HEPAIGG Nonreactive 07/22/2015    HEPBCAB Reactive (A) 07/22/2015    HEPCAB Positive (A) 03/05/2014     Lab Results   Component Value Date    ALKPHOS 49 03/06/2023    BILITOT 0.2 (L) 03/06/2023    BILIDIR <0.10 12/23/2020    PROT 6.6 03/06/2023    ALBUMIN 3.7 03/06/2023    ALT 18 03/06/2023    AST 26 03/06/2023     No results found for: PREGTESTUR, PREGSERUM, HCG, HCGQUANT    - Suboxone treatment agreement:  has been signed by patient and scanned into record  Orders:    buprenorphine-naloxone (SUBOXONE) 8-2 mg sublingual film; Place 1 Film (8 mg of buprenorphine total) under the tongue two (2) times a day.    buprenorphine-naloxone (SUBOXONE) 8-2 mg sublingual film; Place 1 Film (8 mg of buprenorphine total) under the tongue two (2) times a day.                  Return in 6 weeks (on 07/14/2023) for In-person at 10am on 07/14/23.           The patient reports they are physically located in [place] and is currently: at home. I conducted a audio/video visit. I spent  77m 06s on the video call with the patient. I spent an additional 16 minutes on pre- and post-visit activities on the date of service .

## 2023-05-30 NOTE — Unmapped (Addendum)
Patient IMCMATSTABILITY: is stable on maintenance therapy imcmatmanagementplan1: 2 month follow up, next visit in person    PRESCRIPTIONS GIVEN TODAY:    Medications ordered during this encounter   Medications    pantoprazole (PROTONIX) 40 MG tablet     Sig: Take 1 tablet (40 mg total) by mouth nightly.     Dispense:  60 tablet     Refill:  0    buprenorphine-naloxone (SUBOXONE) 8-2 mg sublingual film     Sig: Place 1 Film (8 mg of buprenorphine total) under the tongue two (2) times a day.     Dispense:  60 Film     Refill:  0     NADEAN:[id number]    buprenorphine-naloxone (SUBOXONE) 8-2 mg sublingual film     Sig: Place 1 Film (8 mg of buprenorphine total) under the tongue two (2) times a day.     Dispense:  60 Film     Refill:  0     ZOXWRU:[id number]      - Benefits of treatment outweigh the risks  - Utox today: will be positive for: will complete at next visit  - PDMP reviewed: appropriate  - Next appt: Return in 6 weeks (on 07/14/2023) for In-person at 10am on 07/14/23.  - Substance Abuse Counseling:  none  - Does pt have naloxone at home and know how to use it?  yes  - Labs needed today? none    No results found for: HIV12AB, HIVAGAB  Lab Results   Component Value Date    HEPAIGG Nonreactive 07/22/2015    HEPBCAB Reactive (A) 07/22/2015    HEPCAB Positive (A) 03/05/2014     Lab Results   Component Value Date    ALKPHOS 49 03/06/2023    BILITOT 0.2 (L) 03/06/2023    BILIDIR <0.10 12/23/2020    PROT 6.6 03/06/2023    ALBUMIN 3.7 03/06/2023    ALT 18 03/06/2023    AST 26 03/06/2023     No results found for: PREGTESTUR, PREGSERUM, HCG, HCGQUANT    - Suboxone treatment agreement:  has been signed by patient and scanned into record  Orders:    buprenorphine-naloxone (SUBOXONE) 8-2 mg sublingual film; Place 1 Film (8 mg of buprenorphine total) under the tongue two (2) times a day.    buprenorphine-naloxone (SUBOXONE) 8-2 mg sublingual film; Place 1 Film (8 mg of buprenorphine total) under the tongue two (2) times a day.

## 2023-05-30 NOTE — Unmapped (Signed)
Noted intermittently on prior labs.  Thought to be related to portal hypertensive gastropathy seen on prior EGD (last in 2021).   Orders:    CBC; Future

## 2023-05-30 NOTE — Unmapped (Signed)
Error

## 2023-05-30 NOTE — Unmapped (Signed)
Last ordered: Today (05/30/2023) by Roma Schanz, MD

## 2023-06-02 ENCOUNTER — Ambulatory Visit: Admit: 2023-06-02 | Discharge: 2023-06-03 | Payer: MEDICARE

## 2023-06-02 LAB — COMPREHENSIVE METABOLIC PANEL
ALBUMIN: 3.7 g/dL (ref 3.4–5.0)
ALKALINE PHOSPHATASE: 47 U/L (ref 46–116)
ALT (SGPT): 18 U/L (ref 10–49)
ANION GAP: 5 mmol/L (ref 5–14)
AST (SGOT): 31 U/L (ref <=34)
BILIRUBIN TOTAL: 0.2 mg/dL — ABNORMAL LOW (ref 0.3–1.2)
BLOOD UREA NITROGEN: 17 mg/dL (ref 9–23)
BUN / CREAT RATIO: 18
CALCIUM: 9 mg/dL (ref 8.7–10.4)
CHLORIDE: 111 mmol/L — ABNORMAL HIGH (ref 98–107)
CO2: 28 mmol/L (ref 20.0–31.0)
CREATININE: 0.97 mg/dL
EGFR CKD-EPI (2021) MALE: 87 mL/min/{1.73_m2} (ref >=60)
GLUCOSE RANDOM: 100 mg/dL — ABNORMAL HIGH (ref 70–99)
POTASSIUM: 4 mmol/L (ref 3.4–4.8)
PROTEIN TOTAL: 6.7 g/dL (ref 5.7–8.2)
SODIUM: 144 mmol/L (ref 135–145)

## 2023-06-02 LAB — CBC
HEMATOCRIT: 36.9 % — ABNORMAL LOW (ref 39.0–48.0)
HEMOGLOBIN: 12.7 g/dL — ABNORMAL LOW (ref 12.9–16.5)
MEAN CORPUSCULAR HEMOGLOBIN CONC: 34.4 g/dL (ref 32.0–36.0)
MEAN CORPUSCULAR HEMOGLOBIN: 36.7 pg — ABNORMAL HIGH (ref 25.9–32.4)
MEAN CORPUSCULAR VOLUME: 106.6 fL — ABNORMAL HIGH (ref 77.6–95.7)
MEAN PLATELET VOLUME: 6.8 fL (ref 6.8–10.7)
PLATELET COUNT: 185 10*9/L (ref 150–450)
RED BLOOD CELL COUNT: 3.47 10*12/L — ABNORMAL LOW (ref 4.26–5.60)
RED CELL DISTRIBUTION WIDTH: 12.3 % (ref 12.2–15.2)
WBC ADJUSTED: 6.5 10*9/L (ref 3.6–11.2)

## 2023-06-09 DIAGNOSIS — G4709 Other insomnia: Principal | ICD-10-CM

## 2023-06-09 MED ORDER — MIRTAZAPINE 15 MG TABLET
ORAL_TABLET | 0 refills | 0 days
Start: 2023-06-09 — End: ?

## 2023-06-12 MED ORDER — MIRTAZAPINE 15 MG TABLET
ORAL_TABLET | 0 refills | 0 days
Start: 2023-06-12 — End: ?

## 2023-06-17 MED ORDER — BUPRENORPHINE 8 MG-NALOXONE 2 MG SUBLINGUAL FILM
ORAL_FILM | Freq: Two times a day (BID) | SUBLINGUAL | 0 refills | 30 days | Status: CP
Start: 2023-06-17 — End: 2023-07-17

## 2023-06-29 ENCOUNTER — Ambulatory Visit: Admit: 2023-06-29 | Payer: MEDICARE | Attending: Radiation Oncology | Primary: Radiation Oncology

## 2023-06-29 ENCOUNTER — Ambulatory Visit: Admit: 2023-06-29 | Discharge: 2023-06-30 | Payer: MEDICARE

## 2023-06-29 DIAGNOSIS — C61 Malignant neoplasm of prostate: Principal | ICD-10-CM

## 2023-06-29 LAB — PSA: PROSTATE SPECIFIC ANTIGEN: <0.04 ng/mL (ref 0.00–4.00)

## 2023-06-29 NOTE — Unmapped (Signed)
Frequent loose watery stools due to Lactulose. Occasional bowel urgency. No other bowel issues reported.    Urinary flow fairly easy. Urinates x2-3 times nights and x5-8 during the day. No pain or burning. Occasional urgency. Occasional leakage.    Taking Flomax as prescribed.

## 2023-06-29 NOTE — Unmapped (Signed)
RADIATION ONCOLOGY FOLLOW-UP VISIT NOTE     Encounter Date: 06/29/2023  Patient Name: Harold Richards  Medical Record Number: [id number]    DIAGNOSIS:  67 y.o. male with high risk prostate cancer, cT1c, Gleason 4+4=8 in 2/12 cores, PSA 4.59 s/p external beam RT (45Gy) with brachytherapy boost (brachy done 11/05/2018)    DURATION SINCE COMPLETION OF RADIOTHERAPY:  4 years, 8 months (brachy on 11/05/2018)    Lupron #1 (3 month injection) 07/17/2018  Lupron #2 (3 month injection) 10/16/2018  Lupron #3 (3 month injection) 01/16/2019  Lupron #4 (1 month injection) 04/18/2019  Eligard #5 (3 month injection) 05/29/2019  Eligard #6 (3 month injection) 08/28/2019  Eligard #7 (3 month injection) 11/27/2019    ASSESSMENT:  Disease Status: No Evidence of Disease (NED).  PSA today  <0.04    RECOMMENDATIONS:  PSA today is <0.04 no evidence of disease  ADT:  Completed- last injection 11/27/2019  Hot flashes:  Resolved- suspect his testosterone has recovered  GU: Doing fine on flomax once daily- will switch to at bedtime to see if this helps with nocturia. No further issues with hematuria (had small amount of hematuria in early 2022 but cystoscopy was negative for bladder changes/masses- may be related to kidney stones)  GI:  No issues since RT- has some baseline issues related to lactulose use. Colonoscopy last done 08/2021.  FOLLOW-UP:  He is now 5 years out from RT and will extend follow-up to q30m.  He will return in 12 months for routine follow-up and PSA check    INTERVAL HISTORY:    Feeling well today without any new issues since our last visit.  Urinary issues are stable- good flow, nocturia 2-3x, mild urgency.  No dysuria, no hematuria.  No new GI issues- does take lactulose so has some stable loose stool related to this.  No hot flashes.  No other new medical issues.  Enjoying retirement-  spending some time with his grandkids in [place] and looking forward to taking the youngest grandkid to [place] in 2025.    Baseline  General: Very fatigued. No ED  Urinary: On flomax, post-void residual 0 cc. Has frequency, nocturia 2-3x/night, no urgency, hematuria, dysuria.   GI: Denies loose stools, rectal pain, or bleeding.   Colonoscopy: Done 10/2015, found polyp.  Repeat in 5 years.    REVIEW OF SYSTEMS:  A comprehensive review of 10 systems was negative except for pertinent positives noted in HPI.    PAST MEDICAL HISTORY/FAMILY HISTORY/SOCIAL HISTORY:  Reviewed in EPIC    ALLERGIES/MEDICATIONS:  Reviewed in EPIC    PHYSICAL EXAM:  Vital Signs for this encounter:   There were no vitals taken for this visit.  Karnofsky/Lansky Performance Status: 90,  Able to carry on normal activity; minor signs or symptoms of disease (ECOG equivalent 0)  General:   No acute distress, alert and oriented X 4   Head: Normocephalic, without obvious abnormality, atraumatic  Eyes: EOMI, no scleral icterus  Lungs: Normal work of breathing  Abdomen: non-distended  Extremities: extremities normal, atraumatic, no cyanosis or edema  Lymph nodes: No palpable supraclavicular or cervical lymphadenopathy  Neurologic: Grossly normal   Rectal: Deferred      RADIOLOGY:  No new imaging to review    Labs:    PSA   Date Value Ref Range Status   12/27/2022 <0.04 0.00 - 4.00 ng/mL Final   06/28/2022 <0.04 0.00 - 4.00 ng/mL Final   12/23/2021 <0.04 0.00 - 4.00 ng/mL Final   11/05/2021  0.06 0.00 - 4.00 ng/mL Final   06/23/2021 <0.04 0.00 - 4.00 ng/mL Final   03/23/2021 <0.04 0.00 - 4.00 ng/mL Final   12/15/2020 <0.04 0.00 - 4.00 ng/mL Final   09/15/2020 <0.04 0.00 - 4.00 ng/mL Final   05/28/2020 <0.04 0.00 - 4.00 ng/mL Final   02/27/2020 <0.04 0.00 - 4.00 ng/mL Final   08/28/2019 <0.10 0.00 - 4.00 ng/mL Final   01/16/2019 <0.10 0.00 - 4.00 ng/mL Final   05/18/2018 4.59 (H) 0.00 - 4.00 ng/mL Final   10/26/2017 3.88 0.00 - 4.00 ng/mL Final   07/27/2017 4.15 (H) 0.00 - 4.00 ng/mL Final     This encounter was related to my continuous and active collaborative plan of care related to this patient's serious (or complex) condition       Rayetta Humphrey, MD  Assistant Professor  [facility] Dept of Radiation Oncology  06/29/2023

## 2023-07-01 ENCOUNTER — Ambulatory Visit
Admit: 2023-07-01 | Discharge: 2023-07-02 | Payer: MEDICARE | Attending: Student in an Organized Health Care Education/Training Program | Primary: Student in an Organized Health Care Education/Training Program

## 2023-07-01 DIAGNOSIS — N390 Urinary tract infection, site not specified: Principal | ICD-10-CM

## 2023-07-01 DIAGNOSIS — R300 Dysuria: Principal | ICD-10-CM

## 2023-07-01 MED ORDER — NITROFURANTOIN MONOHYDRATE/MACROCRYSTALS 100 MG CAPSULE
ORAL_CAPSULE | Freq: Two times a day (BID) | ORAL | 0 refills | 7 days | Status: CP
Start: 2023-07-01 — End: 2023-07-08

## 2023-07-01 NOTE — Unmapped (Signed)
ASSESSMENT/PLAN:     Pt with UTI. Macrobid prescribed. Urine culture ordered. Pt advised on signs and symptoms that would warrant returning to Urgent Care or Emergency Department. Pt voices understanding. Pt adivsed to return to Urgent Care or Emergency Department if symptoms persist or worsen.      Diagnosis ICD-10-CM Associated Orders   1. Acute UTI  N39.0 nitrofurantoin, macrocrystal-monohydrate, (MACROBID) 100 MG capsule     Urine Culture      2. Dysuria  R30.0 POCT urinalysis dipstick        Requested Prescriptions     Signed Prescriptions Disp Refills    nitrofurantoin, macrocrystal-monohydrate, (MACROBID) 100 MG capsule 14 capsule 0     Sig: Take 1 capsule (100 mg total) by mouth two (2) times a day for 7 days.     Instructions about new medications and side effects provided.  If any changes in chronic medications then new reconciled medication list is given to patient.     CHIEF COMPLAINT:     Chief Complaint   Patient presents with    Cystitis     Frequency and dysuria started yesterday afternoon          SUBJECTIVE/HPI:   67 y.o. Male presents with dysuria and urinary frequency x1 day. Pt has not taken medication for relief. Pt denies fever.    Past Medical History:   Diagnosis Date    Actinic keratosis     Anemia 05/30/2023    Asthma     Basal cell carcinoma     Chronic headaches     Colon polyp     Enlarged prostate     GERD (gastroesophageal reflux disease) 02/08/2013    Hepatitis C     Hx of substance abuse (CMS-HCC)     hx of recreational drug use (cocaine IV, intranasal and crack),     Infectious viral hepatitis     Lung nodule seen on imaging study 07/12/2016    Osteoporosis     Other emphysema (CMS-HCC)     Renal calculus     Skin cancer     Tobacco abuse 02/08/2013     Allergies   Allergen Reactions    Bicalutamide Rash    Penicillins Rash    Rocephin [Ceftriaxone] Rash     Patient received in Emergency Room and reported rash to stomach and back several hours later.      Social History Socioeconomic History    Marital status: Married     Spouse name: None    Number of children: None    Years of education: None    Highest education level: None   Occupational History    Occupation: retired   Tobacco Use    Smoking status: Every Day     Current packs/day: 1.00     Average packs/day: 1 pack/day for 54.0 years (54.0 ttl pk-yrs)     Types: Cigarettes     Start date: 06/16/1969    Smokeless tobacco: Former     Types: Chew     Quit date: 2009   Vaping Use    Vaping status: Never Used   Substance and Sexual Activity    Alcohol use: No     Alcohol/week: 0.0 standard drinks of alcohol    Drug use: Not Currently     Types: Cocaine, IV, Marijuana     Comment: History of cocaine use years ago (intranasal, IV and crack), marijuana use.     Sexual activity: Yes  Partners: Female   Other Topics Concern    Do you use sunscreen? Yes    Tanning bed use? No    Are you easily burned? No    Excessive sun exposure? Yes    Blistering sunburns? Yes     Social Drivers of Psychologist, prison and probation services Strain: Low Risk  (07/26/2022)    Overall Financial Resource Strain (CARDIA)     Difficulty of Paying Living Expenses: Not hard at all   Food Insecurity: No Food Insecurity (07/26/2022)    Hunger Vital Sign     Worried About Running Out of Food in the Last Year: Never true     Ran Out of Food in the Last Year: Never true   Transportation Needs: No Transportation Needs (07/26/2022)    PRAPARE - Therapist, art (Medical): No     Lack of Transportation (Non-Medical): No     Family History   Problem Relation Age of Onset    Lung cancer Mother     Bone cancer Mother     Hypertension Mother     Heart attack Father     Hypertension Father     Hepatitis Sister     Melanoma Maternal Aunt        ROS:   Review of Systems   Genitourinary:  Positive for dysuria and frequency.     See Subjective/HPI  Medications, Allergies and Problem List personally reviewed in Epic today    OBJECTIVE:          07/01/23 0921 BP: 123/68   Pulse: 65   Resp: 16   Temp: 36.7 ??C (98.1 ??F)   SpO2: 98%   Weight: 52.6 kg (116 lb)   Height: 167.6 cm (5' 6)   PainSc: 0-No pain     Physical Exam  Vitals reviewed.   Constitutional:       General: He is not in acute distress.     Appearance: Normal appearance.   HENT:      Head: Normocephalic.      Right Ear: External ear normal.      Left Ear: External ear normal.      Nose: Nose normal.      Mouth/Throat:      Pharynx: Oropharynx is clear.   Eyes:      Conjunctiva/sclera: Conjunctivae normal.   Cardiovascular:      Rate and Rhythm: Normal rate.   Pulmonary:      Effort: Pulmonary effort is normal. No respiratory distress.   Abdominal:      Tenderness: There is no right CVA tenderness or left CVA tenderness.   Skin:     General: Skin is warm and dry.   Neurological:      Mental Status: He is alert.          No results found.     LABS/X-RAYS/EKG/MEDS:       Results for orders placed or performed in visit on 07/01/23   POCT urinalysis dipstick   Result Value Ref Range    Color, UA Yellow     Clarity, UA Clear     Glucose, UA Negative Negative    Bilirubin, UA Negative Negative    Ketones, POC Trace (A) Negative    Spec Grav, UA >=1.030 (A) 1.005 - 1.030    Blood, UA Trace-lysed (A) Negative    pH, UA 5.5 5.0 - 9.0    Protein, UA Negative Negative    Urobilinogen,  UA 0.2 E.U./dL Negative (0.2 mg/dL)    Leukocytes, UA Negative Negative    Nitrite, UA Negative Negative    STRIP LOT NUMBER [id number]     STRIP LOT EXPIRATION 05/07/2024        No results found.      I provided an intervention for the Tobacco Use SDOH domain. The intervention was Counseling and Education     Follow-up with PCP      A copy of these instructions have been given to the patient or responsible adult who demonstrated the ability to learn, asked appropriate questions, and verbalized understanding of the plan of care.  There were no barriers to learning identified.    Please take the time to sign up for a MyChart Account. See sign up information in your After Visit Summary. You will be able to view lab results, make appointments, communicate with providers, and much more.

## 2023-07-08 ENCOUNTER — Ambulatory Visit
Admit: 2023-07-08 | Discharge: 2023-07-08 | Disposition: A | Discharge status: Home / Self Care | Payer: MEDICARE | Attending: Student in an Organized Health Care Education/Training Program

## 2023-07-08 ENCOUNTER — Emergency Department
Admit: 2023-07-08 | Discharge: 2023-07-08 | Disposition: A | Discharge status: Home / Self Care | Payer: MEDICARE | Attending: Student in an Organized Health Care Education/Training Program

## 2023-07-08 DIAGNOSIS — N390 Urinary tract infection, site not specified: Principal | ICD-10-CM

## 2023-07-08 DIAGNOSIS — R319 Hematuria, unspecified: Principal | ICD-10-CM

## 2023-07-08 DIAGNOSIS — N200 Calculus of kidney: Principal | ICD-10-CM

## 2023-07-08 LAB — CBC W/ AUTO DIFF
BASOPHILS ABSOLUTE COUNT: 0.1 10*9/L (ref 0.0–0.1)
BASOPHILS RELATIVE PERCENT: 0.9 %
EOSINOPHILS ABSOLUTE COUNT: 0.1 10*9/L (ref 0.0–0.5)
EOSINOPHILS RELATIVE PERCENT: 1.3 %
HEMATOCRIT: 37.6 % — ABNORMAL LOW (ref 39.0–48.0)
HEMOGLOBIN: 12.9 g/dL (ref 12.9–16.5)
LYMPHOCYTES ABSOLUTE COUNT: 1.1 10*9/L (ref 1.1–3.6)
LYMPHOCYTES RELATIVE PERCENT: 17.6 %
MEAN CORPUSCULAR HEMOGLOBIN CONC: 34.2 g/dL (ref 32.0–36.0)
MEAN CORPUSCULAR HEMOGLOBIN: 36.2 pg — ABNORMAL HIGH (ref 25.9–32.4)
MEAN CORPUSCULAR VOLUME: 105.6 fL — ABNORMAL HIGH (ref 77.6–95.7)
MEAN PLATELET VOLUME: 7 fL (ref 6.8–10.7)
MONOCYTES ABSOLUTE COUNT: 0.5 10*9/L (ref 0.3–0.8)
MONOCYTES RELATIVE PERCENT: 7.9 %
NEUTROPHILS ABSOLUTE COUNT: 4.5 10*9/L (ref 1.8–7.8)
NEUTROPHILS RELATIVE PERCENT: 72.3 %
NUCLEATED RED BLOOD CELLS: 0 /100{WBCs} (ref <=4)
PLATELET COUNT: 208 10*9/L (ref 150–450)
RED BLOOD CELL COUNT: 3.56 10*12/L — ABNORMAL LOW (ref 4.26–5.60)
RED CELL DISTRIBUTION WIDTH: 12.4 % (ref 12.2–15.2)
WBC ADJUSTED: 6.3 10*9/L (ref 3.6–11.2)

## 2023-07-08 LAB — URINALYSIS WITH MICROSCOPY WITH CULTURE REFLEX PERFORMABLE
BILIRUBIN UA: NEGATIVE
GLUCOSE UA: NEGATIVE
HYALINE CASTS: 5 /LPF — ABNORMAL HIGH (ref 0–1)
LEUKOCYTE ESTERASE UA: NEGATIVE
NITRITE UA: NEGATIVE
PH UA: 5.5 (ref 5.0–9.0)
PROTEIN UA: 100 — AB
RBC UA: 30 /HPF — ABNORMAL HIGH (ref <=3)
SPECIFIC GRAVITY UA: 1.029 (ref 1.003–1.030)
SQUAMOUS EPITHELIAL: <1 /HPF (ref 0–5)
UROBILINOGEN UA: 2 — AB
WBC UA: 19 /HPF — ABNORMAL HIGH (ref <=2)

## 2023-07-08 LAB — COMPREHENSIVE METABOLIC PANEL
ALBUMIN: 3.7 g/dL (ref 3.4–5.0)
ALKALINE PHOSPHATASE: 50 U/L (ref 46–116)
ALT (SGPT): 15 U/L (ref 10–49)
ANION GAP: 11 mmol/L (ref 5–14)
AST (SGOT): 28 U/L (ref <=34)
BILIRUBIN TOTAL: 0.2 mg/dL — ABNORMAL LOW (ref 0.3–1.2)
BLOOD UREA NITROGEN: 19 mg/dL (ref 9–23)
BUN / CREAT RATIO: 19
CALCIUM: 9.2 mg/dL (ref 8.7–10.4)
CHLORIDE: 107 mmol/L (ref 98–107)
CO2: 26.7 mmol/L (ref 20.0–31.0)
CREATININE: 1.02 mg/dL (ref 0.73–1.18)
EGFR CKD-EPI (2021) MALE: 82 mL/min/{1.73_m2} (ref >=60)
GLUCOSE RANDOM: 126 mg/dL (ref 70–179)
POTASSIUM: 3.9 mmol/L (ref 3.4–4.8)
PROTEIN TOTAL: 7 g/dL (ref 5.7–8.2)
SODIUM: 145 mmol/L (ref 135–145)

## 2023-07-08 MED ORDER — SULFAMETHOXAZOLE 800 MG-TRIMETHOPRIM 160 MG TABLET
ORAL_TABLET | Freq: Two times a day (BID) | ORAL | 0 refills | 7 days | Status: CP
Start: 2023-07-08 — End: 2023-07-15

## 2023-07-08 MED ADMIN — ciprofloxacin (CIPRO) 2 mg/mL in dextrose 5% IVPB 400 mg: 400 mg | INTRAVENOUS | @ 15:00:00 | Stop: 2023-07-08

## 2023-07-08 NOTE — Unmapped (Signed)
Assessment:  67 year old male past medical history of prostate cancer s/p radiation, asthma, hepatitis C, cirrhosis, nephrolithiasis GERD with a week of dysuria, hematuria, suprapubic pain and bilateral flank pain.  Patient has been on Macrobid    Patient has an obstructing left UVJ stone resulting in moderate left hydronephrosis.  Stone is approximately 3 mm.  Her several other nonobstructing stones present    Patient is afebrile.  VSS.  No elevated white blood cell count.  Creatinine is nonconcerning at 1.02.  UA is not very concerning.  Negative nitrite, negative leukocyte esterase, however there is 19 white blood cells.      Patient does have an obstructing stone but is safe to try to pass it at home.  We will recommend for the ED provider to switch patient's antibiotics and provide pain medication in addition to Flomax and urine strainer.     Plan:  -Agree with IV dose of ciprofloxacin while in the ED  -Trial MET with urine strainer and nightly Flomax.  Tylenol for mild pain and oxycodone for breakthrough  -Switch Macrobid to Bactrim and take for 5 to 7 days  -Patient will most likely pass the stone on her own

## 2023-07-08 NOTE — Unmapped (Signed)
[facility]  Emergency Department Provider Note     ED Clinical Impression     Final diagnoses:   Renal stone (Primary)   Urinary tract infection with hematuria, site unspecified      Impression, Medical Decision Making, ED Course     Impression: 67 y.o. male who has a past medical history of prostate cancer (s/p radiation), asthma, hepatitis C, cirrhosis, GERD, anemia, and prior substance abuse with one week of dysuria, hematuria, suprapubic pain, and bilateral flank pain as described below.     On exam, the patient is well appearing and in no acute distress. Vital signs are within normal limits. The patient is afebrile. Physical exam with normal external genitalia. No evidence of abnormal lie of the bilateral testicles. Intact cremasters reflex. No inguinal hernia. Abdomen is soft and non-tender.     DDx/MDM: Differential includes likely urinary tract infection or renal colic. Also consider intra-abdominal infection, diverticulitis, appendicitis, pancreatitis, pyelo.     Diagnostic workup as below.     Orders Placed This Encounter   Procedures    Urine Culture    CT abdomen pelvis without contrast    Urinalysis with Microscopy with Culture Reflex    CBC w/ Differential    Comprehensive Metabolic Panel            MDM Elements  Discussion of Management with other Physicians, QHP or Appropriate Source: Consultant - Urology  Independent Interpretation of Studies: CT scan(s) - see ED course  I have reviewed recent and relevant previous record, including: Outpatient notes - 07/01/23 Urgent Care Office Visit Note- Reviewed recent evaluation for dysuria.  Escalation of Care including OBS/Admission/Transfer was considered: However, patient was determined to be appropriate for outpatient management.  Social Determinants that significantly affected care: None.  Prescription drugs considered but not prescribed: None.  Diagnostic tests considered but not performed: None.  ____________________________________________ History     Chief Complaint  Chief Complaint   Patient presents with    Genitourinary Problem       HPI   Harold Richards is a 67 y.o. male with past medical history as below who presents with one week of dysuria, hematuria, suprapubic pain, and bilateral flank pain. He states the pain starts in the suprapubic area and pulls down to the inguinal/scrotal region. He states he was recently evaluated at [facility] (07/01/23) when his symptoms first onset where his UA dipstick and culture were negative, however, he was treated with a course of Macrobid. He reports he has been compliant with the antibiotics but feels his symptoms have been worsening. He notes a history of prior nephrolithiasis, however, states this does not feel similar. He denies fevers, chills, vomiting, or penile discharge.    Outside Historian(s): I have obtained additional history/collateral from patient's wife at bedside.    Past Medical History:   Diagnosis Date    Actinic keratosis     Anemia 05/30/2023    Asthma     Basal cell carcinoma     Chronic headaches     Colon polyp     Enlarged prostate     GERD (gastroesophageal reflux disease) 02/08/2013    Hepatitis C     Hx of substance abuse (CMS-HCC)     hx of recreational drug use (cocaine IV, intranasal and crack),     Infectious viral hepatitis     Lung nodule seen on imaging study 07/12/2016    Osteoporosis     Other emphysema (CMS-HCC)     Renal  calculus     Skin cancer     Tobacco abuse 02/08/2013       Past Surgical History:   Procedure Laterality Date    CERVICAL FUSION      cage    PR COLONOSCOPY FLX DX W/COLLJ SPEC WHEN PFRMD N/A 09/01/2021    Procedure: COLONOSCOPY, FLEXIBLE, PROXIMAL TO SPLENIC FLEXURE; DIAGNOSTIC, W/WO COLLECTION SPECIMEN BY BRUSH OR WASH;  Surgeon: Liane Comber, MD;  Location: [facility] [place];  Service: Gastroenterology    PR COLSC FLX W/RMVL OF TUMOR POLYP LESION SNARE TQ N/A 10/20/2015    Procedure: COLONOSCOPY FLEX; W/REMOV TUMOR/LES BY SNARE;  Surgeon: Annie Paras, MD;  [facility];  Service: Gastroenterology    PR EXCISION SUBMAXILLARY GLAND Right 03/31/2015    Procedure: EXC SUBMANDIBULAR GLAND;  Surgeon: Lauralee Evener, MD;  Location: [facility] OR [facility];  Service: ENT    PR REMV UPPER JAW-MAXILLECTOMY Left 07/22/2019    Procedure: MAXILLECTOMY; WO ORBITAL EXENTERATION;  Surgeon: Adam Swaziland Kimple, MD;  Location: ASC OR [facility];  Service: ENT    PR STEREOTACTIC COMP ASSIST PROC,CRANIAL,EXTRADURAL Left 07/22/2019    Procedure: STEREOTACTIC COMPUTER-ASSISTED (NAVIGATIONAL) PROCEDURE; CRANIAL, EXTRADURAL;  Surgeon: Adam Swaziland Kimple, MD;  Location: ASC OR [facility];  Service: ENT    PR UPPER GI ENDOSCOPY,DIAGNOSIS N/A 08/18/2017    Procedure: UGI ENDO, INCLUDE ESOPHAGUS, STOMACH, & DUODENUM &/OR JEJUNUM; DX W/WO COLLECTION SPECIMN, BY BRUSH OR WASH;  Surgeon: Rona Ravens, MD;  [facility]: [facility];  Service: Gastroenterology    PR UPPER GI ENDOSCOPY,DIAGNOSIS N/A 11/08/2019    Procedure: UGI ENDO, INCLUDE ESOPHAGUS, STOMACH, & DUODENUM &/OR JEJUNUM; DX W/WO COLLECTION SPECIMN, BY BRUSH OR WASH;  Surgeon: Janyth Pupa, MD;  Location: [facility];  Service: Gastroenterology    SKIN BIOPSY         No current facility-administered medications for this encounter.    Current Outpatient Medications:     albuterol HFA 90 mcg/actuation inhaler, Inhale 2 puffs every four (4) hours as needed for wheezing., Disp: 8 g, Rfl: 3    aspirin (ECOTRIN) 81 MG tablet, Take 1 tablet (81 mg total) by mouth daily., Disp: , Rfl:     atorvastatin (LIPITOR) 20 MG tablet, TAKE 1 TABLET(20 MG) BY MOUTH EVERY NIGHT, Disp: 90 tablet, Rfl: 3    buprenorphine-naloxone (SUBOXONE) 8-2 mg sublingual film, Place 1 Film (8 mg of buprenorphine total) under the tongue two (2) times a day., Disp: 60 Film, Rfl: 0    [START ON 07/17/2023] buprenorphine-naloxone (SUBOXONE) 8-2 mg sublingual film, Place 1 Film (8 mg of buprenorphine total) under the tongue two (2) times a day., Disp: 60 Film, Rfl: 0    cholecalciferol, vitamin D3-25 mcg, 1,000 unit,, 25 mcg (1,000 unit) capsule, Take 1 capsule (25 mcg total) by mouth daily., Disp: , Rfl:     fluticasone-umeclidin-vilanter (TRELEGY ELLIPTA) 100-62.5-25 mcg inhaler, Inhale 1 puff daily., Disp: 90 each, Rfl: 3    furosemide (LASIX) 20 MG tablet, Take 1 tablet (20 mg total) by mouth daily as needed for swelling., Disp: 30 tablet, Rfl: 6    lactulose 10 gram/15 mL solution, Take 15 mL (10 g total) by mouth two (2) times a day., Disp: 900 mL, Rfl: 6    loratadine (CLARITIN) 10 mg tablet, Take 1 tablet (10 mg total) by mouth daily., Disp: , Rfl:     mirtazapine (REMERON) 7.5 MG tablet, Take 1 tablet (7.5 mg total) by mouth  nightly., Disp: 90 tablet, Rfl: 0    naloxone (NARCAN) 4 mg nasal spray, One spray in either nostril once for known/suspected opioid overdose. May repeat every 2-3 minutes in alternating nostril til EMS arrives, Disp: 2 each, Rfl: PRN    nitrofurantoin, macrocrystal-monohydrate, (MACROBID) 100 MG capsule, Take 1 capsule (100 mg total) by mouth two (2) times a day for 7 days., Disp: 14 capsule, Rfl: 0    ondansetron (ZOFRAN-ODT) 4 MG disintegrating tablet, Dissolve 1 tablet (4 mg total) in the mouth every twelve (12) hours as needed for nausea., Disp: 60 tablet, Rfl: 1    pantoprazole (PROTONIX) 40 MG tablet, Take 1 tablet (40 mg total) by mouth nightly., Disp: 60 tablet, Rfl: 0    rifAXIMin (XIFAXAN) 550 mg Tab, Take 1 tablet (550 mg total) by mouth Two (2) times a day., Disp: 180 tablet, Rfl: 1    sulfamethoxazole-trimethoprim (BACTRIM DS) 800-160 mg per tablet, Take 1 tablet (160 mg of trimethoprim total) by mouth two (2) times a day for 7 days., Disp: 14 tablet, Rfl: 0    tamsulosin (FLOMAX) 0.4 mg capsule, Take 1 capsule (0.4 mg total) by mouth daily., Disp: 90 capsule, Rfl: 3    Allergies  Bicalutamide, Penicillins, and Rocephin [ceftriaxone]    Family History  Family History Problem Relation Age of Onset    Lung cancer Mother     Bone cancer Mother     Hypertension Mother     Heart attack Father     Hypertension Father     Hepatitis Sister     Melanoma Maternal Aunt        Social History  Social History     Tobacco Use    Smoking status: Every Day     Current packs/day: 1.00     Average packs/day: 1 pack/day for 54.1 years (54.1 ttl pk-yrs)     Types: Cigarettes     Start date: 06/16/1969    Smokeless tobacco: Former     Types: Chew     Quit date: 2009   Vaping Use    Vaping status: Never Used   Substance Use Topics    Alcohol use: No     Alcohol/week: 0.0 standard drinks of alcohol    Drug use: Not Currently     Types: Cocaine, IV, Marijuana     Comment: History of cocaine use years ago (intranasal, IV and crack), marijuana use.         Physical Exam     VITAL SIGNS:      Vitals:    07/08/23 0831 07/08/23 1115   BP: 140/71 138/68   Pulse: 70 76   Resp: 18 18   Temp: 36.7 ??C (98.1 ??F) 36.9 ??C (98.4 ??F)   TempSrc:  Oral   SpO2: 97%        Constitutional: Alert and oriented. No acute distress.  Eyes: Conjunctivae are normal.  HEENT: Normocephalic and atraumatic. Conjunctivae clear. No congestion. Moist mucous membranes.   Cardiovascular: Rate as above, regular rhythm. Normal and symmetric distal pulses. Brisk capillary refill. Normal skin turgor.  Respiratory: Normal respiratory effort. Breath sounds are normal. There are no wheezing or crackles heard.  Gastrointestinal: Soft, non-distended, non-tender.   Genitourinary: Normal external genitalia. No evidence of abnormal lie of the bilateral testicles. Intact cremasters reflex. No inguinal hernia.  Musculoskeletal: Non-tender with normal range of motion in all extremities.  Neurologic: Normal speech and language. No gross focal neurologic deficits are appreciated. Patient is moving all extremities  equally, face is symmetric at rest and with speech.  Skin: Skin is warm, dry and intact. No rash noted.  Psychiatric: Mood and affect are normal. Speech and behavior are normal.     Radiology     CT abdomen pelvis without contrast   Final Result   Obstructing stone at the left ureterovesicular junction, resulting in mild to moderate left hydroureteronephrosis.      Additional nonobstructing left-sided nephrolithiasis.             Pertinent labs & imaging results that were available during my care of the patient were independently interpreted by me and considered in my medical decision making (see chart for details).    Portions of this record have been created using Scientist, clinical (histocompatibility and immunogenetics). Dictation errors have been sought, but may not have been identified and corrected.    Documentation assistance was provided by Vilinda Blanks, Scribe on July 08, 2023 at 8:51 AM for Tyson Alias, MD.    Documentation assistance was provided by the scribe in my presence.  The documentation recorded by the scribe has been reviewed by me and accurately reflects the services I personally performed.         Hadassah Pais, MD  07/10/23 1428

## 2023-07-08 NOTE — Unmapped (Signed)
error 

## 2023-07-08 NOTE — Unmapped (Signed)
Pt arrives with c/o burning with urination despite finishing PO antibiotics. Pt states going multiple times this am without much production.

## 2023-07-14 ENCOUNTER — Ambulatory Visit: Admit: 2023-07-14 | Discharge: 2023-07-15 | Payer: MEDICARE

## 2023-07-14 DIAGNOSIS — H9193 Unspecified hearing loss, bilateral: Principal | ICD-10-CM

## 2023-07-14 DIAGNOSIS — F1111 Opioid abuse, in remission: Principal | ICD-10-CM

## 2023-07-14 DIAGNOSIS — M546 Pain in thoracic spine: Principal | ICD-10-CM

## 2023-07-14 DIAGNOSIS — N1330 Unspecified hydronephrosis: Principal | ICD-10-CM

## 2023-07-14 DIAGNOSIS — N200 Calculus of kidney: Principal | ICD-10-CM

## 2023-07-14 LAB — URINALYSIS WITH MICROSCOPY WITH CULTURE REFLEX PERFORMABLE
BILIRUBIN UA: NEGATIVE
BLOOD UA: NEGATIVE
GLUCOSE UA: NEGATIVE
NITRITE UA: NEGATIVE
PH UA: 5.5 (ref 5.0–9.0)
RBC UA: 7 /HPF — ABNORMAL HIGH (ref <=3)
SPECIFIC GRAVITY UA: 1.032 — ABNORMAL HIGH (ref 1.003–1.030)
SQUAMOUS EPITHELIAL: <1 /HPF (ref 0–5)
UROBILINOGEN UA: 2 — AB
WBC UA: 11 /HPF — ABNORMAL HIGH (ref <=2)

## 2023-07-14 LAB — TOXICOLOGY SCREEN, URINE
AMPHETAMINE SCREEN URINE: NEGATIVE
BARBITURATE SCREEN URINE: NEGATIVE
BENZODIAZEPINE SCREEN, URINE: POSITIVE — AB
BUPRENORPHINE, URINE SCREEN: POSITIVE — AB
CANNABINOID SCREEN URINE: NEGATIVE
COCAINE(METAB.)SCREEN, URINE: NEGATIVE
METHADONE SCREEN, URINE: NEGATIVE
OPIATE SCREEN URINE: NEGATIVE
OXYCODONE SCREEN URINE: NEGATIVE

## 2023-07-14 NOTE — Unmapped (Addendum)
Several past episodes and most recent symptomatic nephrolithiasis complicated by possible infection and left to go to go hydroureteronephrosis.  Symptoms have now resolved after completing Macrobid and now Bactrim.  Did have a referral to urology placed but had canceled the appointment as his symptoms have resolved.  We discussed ongoing medical therapy to prevent further nephrolithiasis and he is agreement with this referred to the recurrent interdisciplinary stone clinic (urology and nephrology)  Ordered ultrasound renal to reevaluate to ensure the hydroureteronephrosis has resolved and evaluate for any residual stone  Creatinine was not elevated at that time.  Would defer recheck of labs  Continue Flomax 0.4 mg daily  Check UA to ensure resolution of hematuria and no signs of infection  Orders:    US Renal Complete; Future    Urinalysis with Microscopy with Culture Reflex    Ambulatory referral to Urology; Future    Urine Culture

## 2023-07-14 NOTE — Unmapped (Signed)
[facility]     Reason for visit: Follow up    Questions / Concerns that need to be addressed:   none    Screening BP-     [employer] BPs (complete if screening BP has a systolic  > 130 or diastolic > 80)  BP#1  131/57  78  BP#2  125/54  74  BP#3  121/52  72    Average BP   126/54  75  (please note this as a comment in vitals)     PTHomeBP       HCDM reviewed and updated in Epic:    We are working to make sure all of our patients??? wishes are updated in Epic and part of that is documenting a Environmental health practitioner for each patient  A Health Care Decision Maker is someone you choose who can make health care decisions for you if you are not able - who would you most want to do this for you????  is already up to date.    HCDM (patient stated preference): Harold Richards, Harold Richards - Spouse - [phone]    HCDM, First Alternate: Harold Richards - Daughter - [phone]    HCDM, Second Alternate: Harold Richards,Harold Richards - Daughter - [phone]      __________________________________________________________________________________________    SCREENINGS COMPLETED IN FLOWSHEETS      AUDIT       PHQ2       PHQ9          GAD7       COPD Assessment       Falls Risk

## 2023-07-14 NOTE — Unmapped (Addendum)
Patient IMCMATSTABILITY: is stable on maintenance therapy imcmatmanagementplan1: 2 month follow up, next visit virtual    PRESCRIPTIONS GIVEN TODAY:  No orders of the defined types were placed in this encounter.     - Benefits of treatment outweigh the risks  - Utox today: will be positive for: Suboxone.  Oxycodone noted for pain management by urology consult in the ER, however does not appear that he was prescribed this or given this in the ER.  - PDMP reviewed: Appropriate on review today  - Next appt: Return in about 2 months (around 09/14/2023) for Video Visit.  - Substance Abuse Counseling:  not currently enrolled  - Does pt have naloxone at home and know how to use it?  yes  - Labs needed today? none    No results found for: HIV12AB, HIVAGAB  Lab Results   Component Value Date    HEPAIGG Nonreactive 07/22/2015    HEPBCAB Reactive (A) 07/22/2015    HEPCAB Positive (A) 03/05/2014     Lab Results   Component Value Date    ALKPHOS 50 07/08/2023    BILITOT 0.2 (L) 07/08/2023    BILIDIR <0.10 12/23/2020    PROT 7.0 07/08/2023    ALBUMIN 3.7 07/08/2023    ALT 15 07/08/2023    AST 28 07/08/2023     No results found for: PREGTESTUR, PREGSERUM, HCG, HCGQUANT    - Suboxone treatment agreement:  has been signed by patient and scanned into record        Orders:    Toxicology Screen, Urine; Future

## 2023-07-14 NOTE — Unmapped (Addendum)
For your back pain, you can use ibuprofen (advil) 600mg  every 8 hours as needed.  If not better in a couple of weeks, then let me know and I will order an x-ray to make sure no fracture.    Referral to:   Audiology - for hearing test  Urology - recurrent kidney stones and medications or other things to prevent more kidney stones    You will get a call to schedule a ultrasound of your kidneys to ensure that the swelling in your left kidney from the stone has gone a way and if any more stones.

## 2023-07-14 NOTE — Unmapped (Signed)
[facility] Internal Medicine Clinic - Faculty Practice  Internal Medicine Clinic Visit    Reason for visit: ER follow-up, MOUD, back pain    A/P:  Assessment & Plan  Recurrent nephrolithiasis  Several past episodes and most recent symptomatic nephrolithiasis complicated by possible infection and left to go to go hydroureteronephrosis.  Symptoms have now resolved after completing Macrobid and now Bactrim.  Did have a referral to urology placed but had canceled the appointment as his symptoms have resolved.  We discussed ongoing medical therapy to prevent further nephrolithiasis and he is agreement with this referred to the recurrent interdisciplinary stone clinic (urology and nephrology)  Ordered ultrasound renal to reevaluate to ensure the hydroureteronephrosis has resolved and evaluate for any residual stone  Creatinine was not elevated at that time.  Would defer recheck of labs  Continue Flomax 0.4 mg daily  Check UA to ensure resolution of hematuria and no signs of infection  Orders:    US Renal Complete; Future    Urinalysis with Microscopy with Culture Reflex    Ambulatory referral to Urology; Future    Urine Culture    Hydroureteronephrosis  Repeat imaging to ensure this has resolved since he has clinically passed a stone and is now asymptomatic  Orders:    US Renal Complete; Future    Opioid use disorder, mild, in sustained remission (CMS-HCC)  Patient IMCMATSTABILITY: is stable on maintenance therapy imcmatmanagementplan1: 2 month follow up, next visit virtual    PRESCRIPTIONS GIVEN TODAY:  No orders of the defined types were placed in this encounter.     - Benefits of treatment outweigh the risks  - Utox today: will be positive for: Suboxone.  Oxycodone noted for pain management by urology consult in the ER, however does not appear that he was prescribed this or given this in the ER.  - PDMP reviewed: Appropriate on review today  - Next appt: Return in about 2 months (around 09/14/2023) for Video Visit.  - Substance Abuse Counseling:  not currently enrolled  - Does pt have naloxone at home and know how to use it?  yes  - Labs needed today? none    No results found for: HIV12AB, HIVAGAB  Lab Results   Component Value Date    HEPAIGG Nonreactive 07/22/2015    HEPBCAB Reactive (A) 07/22/2015    HEPCAB Positive (A) 03/05/2014     Lab Results   Component Value Date    ALKPHOS 50 07/08/2023    BILITOT 0.2 (L) 07/08/2023    BILIDIR <0.10 12/23/2020    PROT 7.0 07/08/2023    ALBUMIN 3.7 07/08/2023    ALT 15 07/08/2023    AST 28 07/08/2023     No results found for: PREGTESTUR, PREGSERUM, HCG, HCGQUANT    - Suboxone treatment agreement:  has been signed by patient and scanned into record        Orders:    Toxicology Screen, Urine; Future    Diminished hearing, bilateral  Referred for hearing testing  Orders:    Ambulatory referral to Audiology; Future    Acute midline thoracic back pain  From recent fall .  Advised to avoid BC or Goody powders.  Instructed to take ibuprofen as needed with doses given            No follow-ups on file.        __________________________________________________________    HPI:  Initially presented to urgent care for pain with urination and was told it was a kidney stone  kidney stone.  Was prescribed an antibiotic.  Reports he was on 3 different types of antibiotics (macrobid). First one felt worse after 7 days, WEnt to ED and had consultation with urology, also got a dose of IV antibiotics.  Switched to Bactrim and discharged.  Passed stone 1.5 weeks later.  The past couple days has felt better.  No longer seeing hematuria or pain with urination.  This kidney stone felt different from prior because he did not have his severe pain.    Fell yesterday when he was taking off a door and moving around.  He fell back and his back hit the doorknob of the door that he had taken off.  Currently taking BC powders.  __________________________________________________________        Medications:  Reviewed in EPIC  __________________________________________________________    Physical Exam:   Vital Signs:  Vitals:    07/14/23 0939   BP: 126/54   BP Site: L Arm   BP Position: Sitting   Pulse: 75   Temp: 37.3 ??C (99.1 ??F)   TempSrc: Oral   SpO2: 97%   Weight: 50.6 kg (111 lb 9.6 oz)          PTHomeBP    Gen: Thin and frail appearing, NAD  ENT: Diminished hearing bilaterally and frequently leaning forward and turning head during conversation to be able to hear  CV: RRR, no murmurs  Pulm: CTA bilaterally, no crackles or wheezes  Abd: Soft, NTND, normal BS.   GU: No CVA tenderness  MSK: Mild tenderness to palpation along the T10 level and right paraspinal muscles    PHQ-9 Score:     GAD-7 Score:       Medication adherence and barriers to the treatment plan have been addressed. Opportunities to optimize healthy behaviors have been discussed. Patient / caregiver voiced understanding.

## 2023-07-17 MED ORDER — BUPRENORPHINE 8 MG-NALOXONE 2 MG SUBLINGUAL FILM
ORAL_FILM | Freq: Two times a day (BID) | SUBLINGUAL | 0 refills | 30 days | Status: CP
Start: 2023-07-17 — End: 2023-08-16

## 2023-07-20 NOTE — Unmapped (Signed)
Received a call from [facility] @ [facility] stating that this patient has dental surgery tomorrow & they are calling to follow up on a clearance form sent to our office    Chart reviewed--> clearance form was scanned into the media tab yesterday    I let Herbert Seta know that I would have the clearance completed today by Annie Sable, NP and faxed back to her attention    Clearance was completed and faxed back to [facility] @ [phone]    I called Heather to let her know that the fax has been sent--> I also gave her my direct number in case she doesn't receive it for some reason    She was appreciative for the assistance    [employer]

## 2023-07-25 ENCOUNTER — Ambulatory Visit: Admit: 2023-07-25 | Discharge: 2023-07-26 | Payer: MEDICARE

## 2023-07-31 DIAGNOSIS — R110 Nausea: Principal | ICD-10-CM

## 2023-07-31 MED ORDER — PANTOPRAZOLE 40 MG TABLET,DELAYED RELEASE
ORAL_TABLET | Freq: Every evening | ORAL | 3 refills | 90.00 days | Status: CP
Start: 2023-07-31 — End: ?

## 2023-08-21 MED ORDER — BUPRENORPHINE 8 MG-NALOXONE 2 MG SUBLINGUAL FILM
ORAL_FILM | Freq: Two times a day (BID) | SUBLINGUAL | 0 refills | 30.00 days | Status: CP
Start: 2023-08-21 — End: 2023-09-20

## 2023-08-22 NOTE — Unmapped (Signed)
[facility] Specialty Medication Onboarding    Specialty Medication: XIFAXAN 550 mg Tab (rifAXIMin)  Prior Authorization: Approved   Financial Assistance: No - patient doesn't qualify for additional assistance   Final Copay/Day Supply: $1,051.22 / 30    Insurance Restrictions: Yes - max 1 month supply     Notes to Pharmacist:   Credit Card on File: no  Start Date on Rx:      The triage team has completed the benefits investigation and has determined that the patient is able to fill this medication at [facility]. Please contact the patient to complete the onboarding or follow up with the prescribing physician as needed.

## 2023-08-24 NOTE — Unmapped (Signed)
[facility] has reached out to this patient via MyChart to onboard them to our Specialty Lite services for their Xifaxan. Their medication was last delivered on 02/07/23.They will now receive proactive outreach from the pharmacy team for refills.    Darryl Nestle, PharmD  [facility] Specialty and Home Delivery Pharmacist

## 2023-09-12 ENCOUNTER — Ambulatory Visit: Admit: 2023-09-12 | Discharge: 2023-09-13 | Payer: MEDICARE

## 2023-09-12 DIAGNOSIS — Z85828 Personal history of other malignant neoplasm of skin: Principal | ICD-10-CM

## 2023-09-12 DIAGNOSIS — D229 Melanocytic nevi, unspecified: Principal | ICD-10-CM

## 2023-09-12 DIAGNOSIS — L570 Actinic keratosis: Principal | ICD-10-CM

## 2023-09-12 DIAGNOSIS — L821 Other seborrheic keratosis: Principal | ICD-10-CM

## 2023-09-12 DIAGNOSIS — D1801 Hemangioma of skin and subcutaneous tissue: Principal | ICD-10-CM

## 2023-09-12 DIAGNOSIS — L814 Other melanin hyperpigmentation: Principal | ICD-10-CM

## 2023-09-12 NOTE — Unmapped (Signed)
 Meet your team:     Your intake nurse is: Verne Carrow     Please remember to fill out the survey you will receive after your visit. Your comments help [place] continue to improve our care.      Thanks in advance!      [facility] Dermatology Clinical Staff

## 2023-09-12 NOTE — Unmapped (Signed)
Dermatology Note     Assessment and Plan:      Actinic Keratosis(es):   - Discussed premalignant nature of lesions and therefore recommended therapy.   - Patient agrees to cryotherapy. See procedure note for details.  Cryotherapy Procedure Note:   After R/B/A discussed (including scarring, pigment alteration, recurrence, or persistence of the lesion) and verbal consent was obtained, identified lesions were treated with liquid nitrogen x 1 ten second freeze-thaw cycle. The patient tolerated the procedure well and was instructed on post-procedure care.  Total AK's frozen: 2  Location and number: left temple (1), L ear (1)  - Will plan to recheck at next appointment    Hand Dermatitis with Fissuring  - Ongoing x 1 month, suspect in setting of dry winter and exacerbated by smoking  - Start Vaseline  - Can trial super glue to close fissure    Weight Loss with temporal wasting  - In setting of dental issues and poor po intake  - Recommend follow up with PCP  - Recommend supplementation with Ensure shakes    Benign Lesions/ Findings:   Angioma(s)  Lentigo/Lentigines  Nevus/Nevi-Benign Appearing  Seborrheic Keratosis(es) - no irritation noted  - Reassurance provided regarding the benign appearance of lesions noted on exam today; no treatment is indicated in the absence of symptoms/changes.  - Reinforced importance of photoprotective strategies including liberal and frequent sunscreen use of a broad-spectrum SPF 30 or greater, use of protective clothing, and sun avoidance for prevention of cutaneous malignancy and photoaging.  Counseled patient on the importance of regular self-skin monitoring as well as routine clinical skin examinations as scheduled.     Personal history of non-melanoma skin cancer   - No evidence of recurrence at this time.  - Discussed maintaining vigilance and counseled on sun protection as above.    The patient was advised to call for an appointment should any new, changing, or symptomatic lesions develop.     RTC: Return in about 6 months (around 03/11/2024). or sooner as needed   _________________________________________________________________      Chief Complaint     Chief Complaint   Patient presents with    Skin Check     Pt coming in for FBSE.  No new areas of concern.        HPI     Cordon Gassett is a 68 y.o. male who presents as a returning patient (last seen 03/14/2023) to Dermatology for a full body skin exam. Patient reports no specific lesions or rash of concern.     At last visit, biopsy of L posterior neck - epidermal hyperplasia with thick serous scale crust, and froze 7 AKs.    Today:  - has noted some dry hands x 1 month with fissures  - has had trouble with weight loss because of dental issues    The patient denies any other new or changing lesions or areas of concern.     Pertinent Past Medical History     History of skin cancer as outlined below:    Problem List       History of nonmelanoma skin cancer - Primary    Skin Cancer History- Non-Melanoma Skin Cancer    Diagnosis Location Biopsy Date Treatment date Procedure Surgeon   [facility] R preauricular  05/2020 Mohs Varma   BCC L cheek  05/2020 Moh[facility] Varma   [facility] L preauricular  09/2020 Mohs Varma   BCC L cheek  09/2020 Mohs Varma   BCC L nose  09/2020 Mohs Varma             Possible history of the past of basal cell with Dr. Orson Aloe treated years ago             Family History:   Negative for melanoma    Past Medical History, Family History, Social History, Medication List, Allergies, and Problem List were reviewed in the rooming section of Epic.     ROS: Other than symptoms mentioned in the HPI, no fevers, chills, or other skin complaints    Physical Examination     GENERAL: Well-appearing male in no acute distress, resting comfortably.  NEURO: Alert and oriented, answers questions appropriately  PSYCH: Normal mood and affect  RESP: No increased work of breathing  SKIN (Full Skin Exam): Examination of the face, eyelids, lips, nose, ears, neck, chest, abdomen, back, arms, legs, hands, feet, palms, soles, nails was performed  - Actinic Keratosis(es): Scaly erythematous macule(s) on the face, L ear  - Angioma(s): Scattered red vascular papule(s) on the scattered diffusely  - Lentigo/lentigines: Scattered pigmented macules that are tan to brown in color and are somewhat non-uniform in shape and concentrated in the sun-exposed areas of the face and upper extremities  - Nevus/nevi: Scattered well-demarcated, regular, pigmented macule(s) and/or papule(s) on the scattered diffusely  - Seborrheic Keratosis(es): Stuck-on appearing keratotic papule(s) on the scattered diffusely, none irritated with redness, crusting, edema, and/or partial avulsion  - Thickened keratotic areas on bilateral hands with fissuring of medial palm bilaterally    All areas not commented on are within normal limits or unremarkable      (Approved Template 04/20/2020)

## 2023-09-15 ENCOUNTER — Encounter: Admit: 2023-09-15 | Discharge: 2023-09-16 | Payer: MEDICARE

## 2023-09-15 DIAGNOSIS — F1111 Opioid abuse, in remission: Principal | ICD-10-CM

## 2023-09-15 DIAGNOSIS — R3129 Other microscopic hematuria: Principal | ICD-10-CM

## 2023-09-15 DIAGNOSIS — Z87891 Personal history of nicotine dependence: Principal | ICD-10-CM

## 2023-09-15 DIAGNOSIS — N200 Calculus of kidney: Principal | ICD-10-CM

## 2023-09-15 DIAGNOSIS — G4709 Other insomnia: Principal | ICD-10-CM

## 2023-09-15 MED ORDER — MIRTAZAPINE 7.5 MG TABLET
ORAL_TABLET | Freq: Every evening | ORAL | 0 refills | 90.00 days | Status: CP
Start: 2023-09-15 — End: 2023-12-14

## 2023-09-15 NOTE — Unmapped (Signed)
[facility] Internal Medicine Clinic  Established Patient - Video Visit    This visit is conducted via video conferencing.    Contact Information  Person Contacted: Patient  Contact Phone number: [phone] (home)   Is there someone else in the room? No.   Patient agreed to a video visit    Harold Richards is a 68 y.o. male  participating in a video visit.    Reason for visit:  Recurrent kidney stones  MOUD treatment    Subjective:  No acute issues.  Doing well.  Not aware that he has two urology appts.  Has not had any urinary issues. Denies hematuria.  We review UA from last appt with some RBCs (>3).       I have reviewed the problem list, medications, and allergies and have updated/reconciled them if needed.    Objective:  As part of this Video Visit, no in-person exam was conducted.  Video interaction permitted the following observations.    GEN: No acute distress.   SKIN: Color is normal. No rashes.  PSYCH: Appropriate affect, normal mood  RESP: Normal work of breathing, no retractions  Assessment & Plan  Recurrent nephrolithiasis  Several past episodes and most recent symptomatic nephrolithiasis complicated by possible infection and left to go to go hydroureteronephrosis.  Symptoms have now resolved after completing Macrobid and now Bactrim.  Did have a referral to urology placed but had canceled the appointment as his symptoms have resolved.  We discussed ongoing medical therapy to prevent further nephrolithiasis and he is agreement with this referred to the recurrent interdisciplinary stone clinic (urology and nephrology)  Ordered ultrasound renal to reevaluate to ensure the hydroureteronephrosis has resolved and evaluate for any residual stone  Creatinine was not elevated at that time.  Would defer recheck of labs  Continue Flomax 0.4 mg daily  Check UA to ensure resolution of hematuria and no signs of infection  Currently has two appt with urology.  Will message those providers to see who he should be seeing. Opioid use disorder, mild, in sustained remission (CMS-HCC)  Patient IMCMATSTABILITY: is stable on maintenance therapy imcmatmanagementplan1: 2 month follow up, next visit in person    PRESCRIPTIONS GIVEN TODAY:    Medications ordered during this encounter   Medications    mirtazapine (REMERON) 7.5 MG tablet     Sig: Take 1 tablet (7.5 mg total) by mouth nightly.     Dispense:  90 tablet     Refill:  0    - has Suboxone prescriptions through 11/19/2023  - Benefits of treatment outweigh the risks  - Utox today: not obtained today (virtual visit)  - PDMP reviewed: Appropriate on review today  - Next appt: Return in 2 months (on 11/14/2023) for In-person 8:45am.  - Substance Abuse Counseling:  not currently enrolled  - Does pt have naloxone at home and know how to use it?  yes  - Labs needed today? 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   Ordered at [employer] purposes.  Orders:    CT Lung Cancer Screening Baseline or Annual Low Dose; Future    Other insomnia  Continue mirtazapine also for weight gain  Orders:    mirtazapine (REMERON) 7.5 MG tablet; Take 1 tablet (7.5 mg total) by mouth nightly.    Microscopic hematuria  Re-evaluate with UA given RBC > 3 at last UA.  Will ask urologist about obtaining CT Urogram prior to appt with them.  Orders: Urinalysis with Microscopy with Culture Reflex; Future               Return in 2 months (on 11/14/2023) for In-person 8:45am.           The patient reports they are physically located in [place] and is currently: at home. I conducted a audio/video visit. I spent  75m 51s on the video call with the patient. I spent an additional 10 minutes on pre- and post-visit activities on the date of service .

## 2023-09-15 NOTE — Unmapped (Addendum)
Patient IMCMATSTABILITY: is stable on maintenance therapy imcmatmanagementplan1: 2 month follow up, next visit in person    PRESCRIPTIONS GIVEN TODAY:    Medications ordered during this encounter   Medications    mirtazapine (REMERON) 7.5 MG tablet     Sig: Take 1 tablet (7.5 mg total) by mouth nightly.     Dispense:  90 tablet     Refill:  0    - has Suboxone prescriptions through 11/19/2023  - Benefits of treatment outweigh the risks  - Utox today: not obtained today (virtual visit)  - PDMP reviewed: Appropriate on review today  - Next appt: Return in 2 months (on 11/14/2023) for In-person 8:45am.  - Substance Abuse Counseling:  not currently enrolled  - Does pt have naloxone at home and know how to use it?  yes  - Labs needed today? 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

## 2023-09-15 NOTE — Unmapped (Signed)
Continue mirtazapine also for weight gain  Orders:    mirtazapine (REMERON) 7.5 MG tablet; Take 1 tablet (7.5 mg total) by mouth nightly.

## 2023-09-15 NOTE — Unmapped (Signed)
[facility] Internal Medicine at [facility]     Are you located in [place]? yes    Reason for visit: Med Refill    Questions / Concerns that need to be addressed: Refill Suboxone    PTHomeBP     HCDM reviewed and updated in Epic:    We are working to make sure all of our patients??? wishes are updated in Epic and part of that is documenting a Environmental health practitioner for each patient  A Health Care Decision Maker is someone you choose who can make health care decisions for you if you are not able - who would you most want to do this for you????  is already up to date.    HCDM (patient stated preference): Harold Richards, Harold Richards - Spouse - [phone]    HCDM, First Alternate: Harold Richards - Daughter - [phone]    HCDM, Second Alternate: Harold Richards,Harold Richards - Daughter - [phone]    BPAs completed:  N/A    __________________________________________________________________________________________    SCREENINGS COMPLETED IN FLOWSHEETS      AUDIT       PHQ2       PHQ9          GAD7       COPD Assessment       Falls Risk

## 2023-09-15 NOTE — Unmapped (Addendum)
Several past episodes and most recent symptomatic nephrolithiasis complicated by possible infection and left to go to go hydroureteronephrosis.  Symptoms have now resolved after completing Macrobid and now Bactrim.  Did have a referral to urology placed but had canceled the appointment as his symptoms have resolved.  We discussed ongoing medical therapy to prevent further nephrolithiasis and he is agreement with this referred to the recurrent interdisciplinary stone clinic (urology and nephrology)  Ordered ultrasound renal to reevaluate to ensure the hydroureteronephrosis has resolved and evaluate for any residual stone  Creatinine was not elevated at that time.  Would defer recheck of labs  Continue Flomax 0.4 mg daily  Check UA to ensure resolution of hematuria and no signs of infection  Currently has two appt with urology.  Will message those providers to see who he should be seeing.

## 2023-09-19 ENCOUNTER — Ambulatory Visit: Admit: 2023-09-19 | Discharge: 2023-09-19 | Payer: MEDICARE | Attending: Audiologist | Primary: Audiologist

## 2023-09-19 ENCOUNTER — Ambulatory Visit: Admit: 2023-09-19 | Discharge: 2023-09-19 | Payer: MEDICARE

## 2023-09-19 DIAGNOSIS — H9223 Otorrhagia, bilateral: Principal | ICD-10-CM

## 2023-09-19 DIAGNOSIS — H9213 Otorrhea, bilateral: Principal | ICD-10-CM

## 2023-09-19 MED ORDER — OFLOXACIN 0.3 % EAR DROPS
Freq: Two times a day (BID) | OTIC | 0 refills | 17.00 days | Status: CP
Start: 2023-09-19 — End: 2023-09-24

## 2023-09-19 NOTE — Unmapped (Deleted)
Otolaryngology New Patient Visit Note      Reason for Visit:  Ear infection     History of Present Illness    Harold Richards is 68 y.o. male being seen for evaluation of ear bleeding.    Patient was seeing audiology and had ear cleaning and there was some bleeding. Sent over here for eval.      Laterality:  Hearing loss:  Drainage:  Pain:  Tinnitus:  Vertigo:  History of ear surgery/PET:  History of ear infections:      Outer Ear infections  Q-tip use:  DM:  Eczema:  Hearing aids:       ROS intake sheet reviewed- please see above for related positives.     Past Medical History     has a past medical history of Actinic keratosis, Allergic, Allergic rhinitis (2018), Anemia (05/30/2023), Asthma, Basal cell carcinoma, Chronic headaches, Colon polyp, Coronary artery disease (2018), Elevated PSA (2019), Enlarged prostate, GERD (gastroesophageal reflux disease) (02/08/2013), Hepatitis C, substance abuse (CMS-HCC), Hyperlipidemia (2018), Infectious viral hepatitis, Lung nodule seen on imaging study (07/12/2016), Osteoporosis, Other emphysema (CMS-HCC), Prostate cancer (CMS-HCC) (October 2019), Renal calculus, Skin cancer, Tinnitus (2018), and Tobacco abuse (02/08/2013).    Past Surgical History     has a past surgical history that includes Cervical fusion; pr excision submaxillary gland (Right, 03/31/2015); pr colsc flx w/rmvl of tumor polyp lesion snare tq (N/A, 10/20/2015); pr upper gi endoscopy,diagnosis (N/A, 08/18/2017); pr stereotactic comp assist proc,cranial,extradural (Left, 07/22/2019); pr remv upper jaw-maxillectomy (Left, 07/22/2019); pr upper gi endoscopy,diagnosis (N/A, 11/08/2019); Skin biopsy; pr colonoscopy flx dx w/collj spec when pfrmd (N/A, 09/01/2021); Vasectomy (1988); Prostate surgery (2020); Spine surgery (2009); Colonoscopy (2014); and Upper gastrointestinal endoscopy (2019).    Current Medications    Current Outpatient Medications   Medication Sig Dispense Refill albuterol HFA 90 mcg/actuation inhaler Inhale 2 puffs every four (4) hours as needed for wheezing. 8 g 3    aspirin (ECOTRIN) 81 MG tablet Take 1 tablet (81 mg total) by mouth daily.      atorvastatin (LIPITOR) 20 MG tablet TAKE 1 TABLET(20 MG) BY MOUTH EVERY NIGHT 90 tablet 3    buprenorphine-naloxone (SUBOXONE) 8-2 mg sublingual film Place 1 Film (8 mg of buprenorphine total) under the tongue two (2) times a day. 60 Film 0    [START ON 09/20/2023] buprenorphine-naloxone (SUBOXONE) 8-2 mg sublingual film Place 1 Film (8 mg of buprenorphine total) under the tongue daily. 30 Film 0    [START ON 10/20/2023] buprenorphine-naloxone (SUBOXONE) 8-2 mg sublingual film Place 1 Film (8 mg of buprenorphine total) under the tongue daily. 30 Film 0    cholecalciferol, vitamin D3-25 mcg, 1,000 unit,, 25 mcg (1,000 unit) capsule Take 1 capsule (25 mcg total) by mouth daily.      fluticasone-umeclidin-vilanter (TRELEGY ELLIPTA) 100-62.5-25 mcg inhaler Inhale 1 puff daily. 90 each 3    furosemide (LASIX) 20 MG tablet Take 1 tablet (20 mg total) by mouth daily as needed for swelling. (Patient not taking: Reported on 07/14/2023) 30 tablet 6    lactulose 10 gram/15 mL solution Take 15 mL (10 g total) by mouth two (2) times a day. 900 mL 6    loratadine (CLARITIN) 10 mg tablet Take 1 tablet (10 mg total) by mouth daily.      mirtazapine (REMERON) 7.5 MG tablet Take 1 tablet (7.5 mg total) by mouth nightly. 90 tablet 0    naloxone (NARCAN) 4 mg nasal spray One spray in either nostril once  for known/suspected opioid overdose. May repeat every 2-3 minutes in alternating nostril til EMS arrives 2 each PRN    pantoprazole (PROTONIX) 40 MG tablet TAKE 1 TABLET(40 MG) BY MOUTH EVERY NIGHT 90 tablet 3    rifAXIMin (XIFAXAN) 550 mg Tab Take 1 tablet (550 mg total) by mouth Two (2) times a day. 180 tablet 1    tamsulosin (FLOMAX) 0.4 mg capsule Take 1 capsule (0.4 mg total) by mouth daily. 90 capsule 3     No current facility-administered medications for this visit.       Allergies    Allergies   Allergen Reactions    Bicalutamide Rash    Penicillins Rash    Rocephin [Ceftriaxone] Rash     Patient received in Emergency Room and reported rash to stomach and back several hours later.        Family History    family history includes Bone cancer in his mother; COPD in his mother; Cancer in his maternal aunt and mother; Diabetes in his paternal aunt; Heart attack in his father; Heart disease in his father and maternal grandfather; Hepatitis in his sister; Hypertension in his father and mother; Liver disease in his brother and brother; Lung cancer in his mother; Melanoma in his maternal aunt.    Social History:     reports that he has been smoking cigarettes. He started smoking about 54 years ago. He has a 55.1 pack-year smoking history. He has never used smokeless tobacco.   reports no history of alcohol use.   reports that he does not currently use drugs after having used the following drugs: Cocaine, IV, and Marijuana.    Review of Systems    A full 12-system review of systems is documented and negative except as noted in HPI.  Patient intake form reviewed.    Vital Signs  There were no vitals taken for this visit.    Physical Exam  General: Well-developed, well-nourished. Appropriate, comfortable, and in no apparent distress.  Voice: clear   Head/Face: On external examination there is no obvious asymmetry or scars. On palpation there is no tenderness over maxillary sinuses or masses within the salivary glands. Cranial nerves V and VII are intact through all distributions.  Eyes: PERRL, EOMI, the conjunctiva are not injected and sclera is non-icteric.  Ears:   Right ear: On external exam, there is no obvious lesions or asymmetry. No cerumen. No lesions in the EAC. The TM is in the neutral position and mobile to pneumatic otoscopy. No middle ear masses or fluid noted.   Hearing is grossly intact.  Left ear: On external exam, there is no obvious lesions or asymmetry. No cerumen. No lesions in the EAC. The TM is in the neutral position and mobile to pneumatic otoscopy. No middle ear masses or fluid noted.   Hearing is grossly intact.  Nose: No external masses, lesions, or deformity. Inspection of nasal mucosa, septum, and turbinates reveals normal nasal exam.   Oral cavity/oropharynx: The mucosa of the lips, gums, hard and soft palate, posterior pharyngeal wall, tongue, floor of mouth, and buccal region are without masses or lesions and are normally hydrated. Good dentition. Tongue protrudes midline. Tonsils are symmetric without any masses or lesions. Supraglottis not visualized due to gag reflex.  Neck: There is no asymmetry or masses. Trachea is midline. There is no enlargement of the thyroid or palpable thyroid nodules.   Lymphatics: There is no palpable lymphadenopathy along the jugulodiagastric, submental, or posterior cervical chains.  Chest:  No audible wheeze, unlabored respirations.  Neurologic: Cranial nerve???s II-XII are grossly intact. Exam is non-focal.  Extremities: No cyanosis, clubbing or edema.    Procedures:  Procedure: {RIGHT LEFT BOTH:29019} Cerumen Disimpaction   Diagnosis: Cerumen impaction, {RIGHT LEFT BOTH:29019} external auditory canal.  Procedure Detail: Using alligator forceps and suction with the use of an otoscope, cerumen impaction was removed from the {RIGHT LEFT BOTH:29019} EAC. The tympanic membrane was visualized and revealed the exam noted above . The patient tolerated the procedure well.     Audiogram  The tympanogram was personally reviewed    The audiogram was also personally reviewed.     These demonstrate a ***    Imaging  I personally reviewed the patients imaging studies.   ***    Outside Medical Records  I personally reviewed patient's medical records from the referring provider.     Assessment/Recommendations:    The patient is a 68 y.o. male with a history of  has a past medical history of Actinic keratosis, Allergic, Allergic rhinitis (2018), Anemia (05/30/2023), Asthma, Basal cell carcinoma, Chronic headaches, Colon polyp, Coronary artery disease (2018), Elevated PSA (2019), Enlarged prostate, GERD (gastroesophageal reflux disease) (02/08/2013), Hepatitis C, substance abuse (CMS-HCC), Hyperlipidemia (2018), Infectious viral hepatitis, Lung nodule seen on imaging study (07/12/2016), Osteoporosis, Other emphysema (CMS-HCC), Prostate cancer (CMS-HCC) (October 2019), Renal calculus, Skin cancer, Tinnitus (2018), and Tobacco abuse (02/08/2013). who presents for the evaluation of {RIGHT LEFT BOTH:29019} ear infection.    Otitis Media  Patient has evidence of otitis media on exam. Recommend oral antibiotic - amoxicillin  Levaquin since patient is penicillin allergic. Recommend Augmentin given no improvement with other antibiotic.       Otitis Externa   Patient has evidence of otitis externa. Ear canal was debrided today. Ear wick was placed. Recommend ciprodex ear drops. Patient should follow-up in one week or sooner if any issues.     The patient voiced complete understanding of plan as detailed above and is in full agreement.

## 2023-09-19 NOTE — Unmapped (Signed)
[facility]  Department of Audiology    AUDIOLOGIC EVALUATION REPORT     PATIENT: Harold Richards, Harold Richards  DOB: October 30, 1955  MRN: [id number]  DOS: 09/19/2023    HISTORY     Harold Richards is a 68 y.o. individual who presents to [facility] for an audiologic evaluation. Patient is referred by Roma Schanz, MD.    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 - Machinery, firearms; occasional HPD use   positive family history of hearing loss - Maternal uncle, in adulthood; daughter, in childhood   negative history of ear surgery   negative history of amplification    Today, patient reports the following symptoms:   negative for otalgia    negative for aural fullness   positive for dizziness - Sitting to standing quickly   positive for tinnitus - BILATERAL, constant, bothersome, pulsatile, ringing, uses television for sound therapy   positive for hearing loss - BILATERAL, progressive, one-on-one and group conversations, in quiet and in background noise, high risk prostate cancer, cT1c, Gleason 4+4=8 in 2/12 cores, PSA 4.59 s/p external beam RT (45Gy) with brachytherapy boost (brachy done 11/05/2018)    No other ear or hearing concerns.    RESULTS     Otoscopy  RIGHT Ear: clear external auditory canal*  LEFT Ear: clear external auditory canal*  *Post-cerumen management.    Tympanometry - using a 226 Hz probe tone  RIGHT Ear: DNT due to bloody otorrhea from cerumen management  LEFT Ear: DNT due to bloody otorrhea from cerumen management    Pure Tone and Speech Audiometry  Today's behavioral evaluation was completed using conventional audiometry via insert earphones and TDH headphones with good reliability.     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 No barriers to education were identified.    RECOMMENDATIONS     - Refer to [facility] re: Bleeding control. Same-day appointment.  - Consider a hearing aid consultation with [facility].  - Use communication strategies for difficult listening environments. (See Patient Instructions.)  - Consider integrating sounds into daily environment and other stress-reducing activities.  - Continue to monitor hearing annually, or sooner should further concerns arise.    Abagail Kitchens, B.A.  Audiology Graduate Student Clinician    I was physically present and immediately available to direct and supervise tasks that were related to patient management. The direction and supervision was continuous throughout the time these tasks were performed.    Truddie [place] Dubray, AUD, CPS/A, CCC-A  Clinical Audiologist  [facility]  Scheduling: [phone]    Procedure(s):    CPT [id number] - Comprehensive Audio Eval & Speech Recognition    HC No Charge; Qty: 2 (CERUMEN MANAGEMENT)    Visit Time:  60 mins    Access your Audiogram via MyChart:  -  Log into myChart: Menu > Document Center > Medical Record Requests  - Click the hyperlink 'Request Medical Records'   - Fill out fields + Select Audiograms option under 'Specific Diagnostic Images' > Submit

## 2023-09-19 NOTE — Unmapped (Signed)
 communication strategies  to improve speech understanding    Set-Up the Situation for Success:  Encourage face-to-face conversation  Turn down background noise when possible  Have good room lighting  Decrease distance to sound source  Arrive early so that you can get a seat in front  At restaurants:   Ask for a booth away from noise sources  Ask for the music to be turned down  Request that speakers use microphones  Anticipate what might be said  Think of topics typically discussed with specific individuals  Read a summary of the movie or play in advance  Use assistive listening devices - ask your audiologist what kinds may be most appropriate for you!    If You Don???t Understand:  Let the person know, don???t just ???bluff??? or nod your head  Repeat the portion of the message you heard and ask the speaker to clarify the information you did not understand   Be assertive and let the speaker know what they can do to help you hear    How to Be a Good Conversational Partner for Someone with Hearing Loss:  Get the person???s attention before speaking   Speak clearly and at a moderately slow pace   Do not shout or over-exaggerate  Pronounce every new name or topic with special care  Try repeating, rephrasing, or simplifying the message  Write down important information such as address or dates  Face the person and avoid turning away, chewing, or covering your face  Speak at a close distance and not from another room  Use gestures and facial expressions  Point to related objects in the environment

## 2023-09-19 NOTE — Unmapped (Signed)
Otolaryngology New Patient Visit Note      Reason for Visit:  Ear bleeding     History of Present Illness    Harold Richards is 68 y.o. male being seen for evaluation of ear bleeding.    Patient was seeing audiology and had ear cleaning and there was some bleeding. Sent over here for eval.     Laterality: Bilateral   Hearing loss: Yes  Q-tip use: Trying to stop   Hearing aids: None    He was unaware of his cerumen impactions. Now presents with bilateral bleeding in the ear after removal (R>L). He has been aware of his hearing loss but has never used hearing aids. Currently not on a blood thinner but does bleed easy on occasion.      Past Medical History     has a past medical history of Actinic keratosis, Allergic, Allergic rhinitis (2018), Anemia (05/30/2023), Asthma, Basal cell carcinoma, Chronic headaches, Colon polyp, Coronary artery disease (2018), Elevated PSA (2019), Enlarged prostate, GERD (gastroesophageal reflux disease) (02/08/2013), Hepatitis C, substance abuse (CMS-HCC), Hyperlipidemia (2018), Infectious viral hepatitis, Lung nodule seen on imaging study (07/12/2016), Osteoporosis, Other emphysema (CMS-HCC), Prostate cancer (CMS-HCC) (October 2019), Renal calculus, Skin cancer, Tinnitus (2018), and Tobacco abuse (02/08/2013).    Past Surgical History     has a past surgical history that includes Cervical fusion; pr excision submaxillary gland (Right, 03/31/2015); pr colsc flx w/rmvl of tumor polyp lesion snare tq (N/A, 10/20/2015); pr upper gi endoscopy,diagnosis (N/A, 08/18/2017); pr stereotactic comp assist proc,cranial,extradural (Left, 07/22/2019); pr remv upper jaw-maxillectomy (Left, 07/22/2019); pr upper gi endoscopy,diagnosis (N/A, 11/08/2019); Skin biopsy; pr colonoscopy flx dx w/collj spec when pfrmd (N/A, 09/01/2021); Vasectomy (1988); Prostate surgery (2020); Spine surgery (2009); Colonoscopy (2014); and Upper gastrointestinal endoscopy (2019).    Current Medications    Current Outpatient Medications   Medication Sig Dispense Refill    albuterol HFA 90 mcg/actuation inhaler Inhale 2 puffs every four (4) hours as needed for wheezing. 8 g 3    aspirin (ECOTRIN) 81 MG tablet Take 1 tablet (81 mg total) by mouth daily.      atorvastatin (LIPITOR) 20 MG tablet TAKE 1 TABLET(20 MG) BY MOUTH EVERY NIGHT 90 tablet 3    buprenorphine-naloxone (SUBOXONE) 8-2 mg sublingual film Place 1 Film (8 mg of buprenorphine total) under the tongue two (2) times a day. 60 Film 0    cholecalciferol, vitamin D3-25 mcg, 1,000 unit,, 25 mcg (1,000 unit) capsule Take 1 capsule (25 mcg total) by mouth daily.      fluticasone-umeclidin-vilanter (TRELEGY ELLIPTA) 100-62.5-25 mcg inhaler Inhale 1 puff daily. 90 each 3    lactulose 10 gram/15 mL solution Take 15 mL (10 g total) by mouth two (2) times a day. 900 mL 6    loratadine (CLARITIN) 10 mg tablet Take 1 tablet (10 mg total) by mouth daily.      methylPREDNISolone (MEDROL DOSEPACK) 4 mg tablet FOLLOW PACKAGE DIRECTIONS      ondansetron (ZOFRAN-ODT) 4 MG disintegrating tablet TAKE 1 TABLET BY MOUTH EVERY 12 HOURS AS NEEDED FOR NAUSEA      pantoprazole (PROTONIX) 40 MG tablet TAKE 1 TABLET(40 MG) BY MOUTH EVERY NIGHT 90 tablet 3    rifAXIMin (XIFAXAN) 550 mg Tab Take 1 tablet (550 mg total) by mouth Two (2) times a day. 180 tablet 1    tamsulosin (FLOMAX) 0.4 mg capsule Take 1 capsule (0.4 mg total) by mouth daily. 90 capsule 3    amoxicillin-clavulanate (AUGMENTIN) 500-125 mg  per tablet Take 1 tablet by mouth Three (3) times a day. (Patient not taking: Reported on 09/19/2023)      [START ON 09/20/2023] buprenorphine-naloxone (SUBOXONE) 8-2 mg sublingual film Place 1 Film (8 mg of buprenorphine total) under the tongue daily. 30 Film 0    [START ON 10/20/2023] buprenorphine-naloxone (SUBOXONE) 8-2 mg sublingual film Place 1 Film (8 mg of buprenorphine total) under the tongue daily. 30 Film 0    furosemide (LASIX) 20 MG tablet Take 1 tablet (20 mg total) by mouth daily as needed for swelling. (Patient not taking: Reported on 09/19/2023) 30 tablet 6    mirtazapine (REMERON) 7.5 MG tablet Take 1 tablet (7.5 mg total) by mouth nightly. (Patient not taking: Reported on 09/19/2023) 90 tablet 0    naloxone (NARCAN) 4 mg nasal spray One spray in either nostril once for known/suspected opioid overdose. May repeat every 2-3 minutes in alternating nostril til EMS arrives (Patient not taking: Reported on 09/19/2023) 2 each PRN     No current facility-administered medications for this visit.       Allergies    Allergies   Allergen Reactions    Bicalutamide Rash    Penicillins Rash    Rocephin [Ceftriaxone] Rash     Patient received in Emergency Room and reported rash to stomach and back several hours later.        Family History    family history includes Bone cancer in his mother; COPD in his mother; Cancer in his maternal aunt and mother; Diabetes in his paternal aunt; Heart attack in his father; Heart disease in his father and maternal grandfather; Hepatitis in his sister; Hypertension in his father and mother; Liver disease in his brother and brother; Lung cancer in his mother; Melanoma in his maternal aunt.    Social History:     reports that he has been smoking cigarettes. He started smoking about 54 years ago. He has a 55.1 pack-year smoking history. He has never used smokeless tobacco.   reports no history of alcohol use.   reports that he does not currently use drugs after having used the following drugs: Cocaine, IV, and Marijuana.    Review of Systems    A full 12-system review of systems is documented and negative except as noted in HPI.  Patient intake form reviewed.    Vital Signs  Blood pressure 137/54, pulse 63, temperature 36.1 ??C (97 ??F), height 167.6 cm (5' 6), weight 55.1 kg (121 lb 6.4 oz).    Physical Exam  General: Well-developed, well-nourished. Appropriate, comfortable, and in no apparent distress.  Voice: clear   Head/Face: On external examination there is no obvious asymmetry or scars. On palpation there is no tenderness over maxillary sinuses or masses within the salivary glands. Cranial nerves V and VII are intact through all distributions.  Eyes: PERRL, EOMI, the conjunctiva are not injected and sclera is non-icteric.  Ears:   Right ear: On external exam, there is no obvious lesions or asymmetry. Bleeding of the EAC - improved with afirin. No cerumen. No lesions in the EAC. The TM is in the neutral position and mobile to pneumatic otoscopy. No middle ear masses or fluid noted.   Hearing is grossly intact.  Left ear: On external exam, there is no obvious lesions or asymmetry. Bleeding of the EAC - stopped. No cerumen. No lesions in the EAC. The TM is in the neutral position and mobile to pneumatic otoscopy. No middle ear masses or fluid noted.  Hearing is grossly intact.  Nose: No external masses, lesions, or deformity.   Neurologic: Cranial nerve???s II-XII are grossly intact. Exam is non-focal.  Extremities: No cyanosis, clubbing or edema.    Audiogram  The tympanogram was personally reviewed- normal.     The audiogram was also personally reviewed. -SNHL     These demonstrate a right mild sloping to severe sensorineural hearing loss and left mild sloping to moderately-severe sensorineural hearing loss. WRS: right 74% and left 78%.         Outside Medical Records  I personally reviewed patient's medical records from the referring provider.     Assessment/Recommendations:    The patient is a 68 y.o. male with a history of  has a past medical history of Actinic keratosis, Allergic, Allergic rhinitis (2018), Anemia (05/30/2023), Asthma, Basal cell carcinoma, Chronic headaches, Colon polyp, Coronary artery disease (2018), Elevated PSA (2019), Enlarged prostate, GERD (gastroesophageal reflux disease) (02/08/2013), Hepatitis C, substance abuse (CMS-HCC), Hyperlipidemia (2018), Infectious viral hepatitis, Lung nodule seen on imaging study (07/12/2016), Osteoporosis, Other emphysema (CMS-HCC), Prostate cancer (CMS-HCC) (October 2019), Renal calculus, Skin cancer, Tinnitus (2018), and Tobacco abuse (02/08/2013). who presents for the evaluation of bleeding in the ears post removal of cerumen impaction with audiology. Afrin was applied in the right ear to stop the bleeding and blood was suctioned from the ear, there was minimal bleeding seen upon exam. Minimal bleeding was seen in the left. I will order antibiotic drops to use in both ears to help with the bleeding and prevent infection. He should use these BID for 5 days. Follow up in 2 weeks' or sooner with any concerns. He should contact me tomorrow if the bleeding and drainage becomes more severe but not likely. Discussed HA with patient would be helpful. Audiogram reviewed.     The patient voiced complete understanding of plan as detailed above and is in full agreement.    Scribe's Attestation: Darliss Ridgel, MD obtained and performed the history, physical exam and medical decision making elements that were entered into the chart. Signed by Fransisca Kaufmann, Scribe, on September 19, 2023 at 10:10 AM.    ----------------------------------------------------------------------------------------------------------------------  September 19, 2023 11:42 AM. Documentation assistance provided by the Scribe. I was present during the time the encounter was recorded. The information recorded by the Scribe was done at my direction and has been reviewed and validated by me. Madelon Lips, MD   ----------------------------------------------------------------------------------------------------------------------

## 2023-09-19 NOTE — Unmapped (Signed)
 For [facility] Patients   For nursing questions, please call Dr. Brayton Caves nurse, Asha at [phone].  To schedule surgery, please call Rolm Bookbinder at [phone].   For Financial counseling, [phone] for imaging and surgery estimates   For Financial assistance [phone] (discuss charity care application)   For [facility] radiology scheduling, please call [phone].   To schedule appointments: [phone]   Office fax: [phone].  For Urgent Medical Advice/Triage Nurse during normal business hours (Mon-Fri 8:15am-4:00 pm), please call [phone].       For [facility] Patients   For nursing questions, surgery scheduling issues or appointments, please call [phone].  For [facility] radiology scheduling, please call [phone].  For [facility] estimates, please call [phone].    Office fax: [phone].    [facility] Radiology Scheduling in [place]   [phone]    After Hours for both places   After hours, please call [phone] and ask for the ENT physician on call.      MyChart is for non-urgent messages.  Please allow for 2-3 business days for a response. Please call the clinic for any urgent messages or concerns.

## 2023-09-20 DIAGNOSIS — F1111 Opioid abuse, in remission: Principal | ICD-10-CM

## 2023-09-20 MED ORDER — BUPRENORPHINE 8 MG-NALOXONE 2 MG SUBLINGUAL FILM
ORAL_FILM | Freq: Every day | SUBLINGUAL | 0 refills | 30.00000 days | Status: CP
Start: 2023-09-20 — End: 2023-10-20

## 2023-09-25 NOTE — Unmapped (Unsigned)
 Otolaryngology Return Clinic Visit Note      Reason for Visit:  Follow-up     History of Present Illness    The patient is a 68 y.o. male who presents for follow-up for ear bleeding after ear cleaning.     The patient denies fevers, chills, shortness of breath, chest pain, nausea, vomiting, diarrhea, inability to lie flat, dysphagia, odynophagia, hemoptysis, hematemesis, changes in vision, changes in voice quality, otalgia, otorrhea, vertiginous symptoms, focal deficits, or other concerning symptoms.    Review of Systems    A 12 system review of systems was performed and is negative other than that noted in the history of present illness.    Vital Signs  There were no vitals taken for this visit.    Physical Exam    General: Well-developed, well-nourished. Appropriate, comfortable, and in no apparent distress.  Voice: clear   Head/Face: On external examination there is no obvious asymmetry or scars. On palpation there is no tenderness over maxillary sinuses or masses within the salivary glands. Cranial nerves V and VII are intact through all distributions.  Eyes: PERRL, EOMI, the conjunctiva are not injected and sclera is non-icteric.  Ears:   Right ear: On external exam, there is no obvious lesions or asymmetry. No cerumen. No lesions in the EAC. The TM is in the neutral position and mobile to pneumatic otoscopy. No middle ear masses or fluid noted.   Hearing is grossly intact.  Left ear: On external exam, there is no obvious lesions or asymmetry. No cerumen. No lesions in the EAC. The TM is in the neutral position and mobile to pneumatic otoscopy. No middle ear masses or fluid noted.   Hearing is grossly intact.  Nose: No external masses, lesions, or deformity. Anterior rhinoscopy of nasal mucosa, septum, and turbinates reveals no abnormalities.   Oral cavity/oropharynx: The mucosa of the lips, gums, hard and soft palate, posterior pharyngeal wall, tongue, floor of mouth, and buccal region are without masses or lesions and are normally hydrated. Good dentition. Tongue protrudes midline. Tonsils are symmetric without any masses or lesions. Supraglottis not visualized due to gag reflex.  Neck: There is no asymmetry or masses. Trachea is midline. There is no enlargement of the thyroid or palpable thyroid nodules.   Lymphatics: There is no palpable lymphadenopathy along the jugulodiagastric, submental, or posterior cervical chains.  Chest: No audible wheeze, unlabored respirations.  Neurologic: Cranial nerve???s II-XII are grossly intact. Exam is non-focal.  Extremities: No cyanosis, clubbing or edema.    Procedures:.    Audiogram  The tympanogram was personally reviewed    The audiogram was also personally reviewed.     These demonstrate a ***    Imaging  I personally reviewed the patients imaging studies. ***      Assessment/Recommendations:    The patient is a 68 y.o. male who presents for ear bleeding.     The patient voiced complete understanding of plan as detailed above and is in full agreement.

## 2023-10-02 NOTE — Unmapped (Signed)
 UROLOGY CLINIC FOLLOW UP NOTE    Patient Name: Harold Richards  Patient Age: 68 y.o.  Encounter Date: 10/04/2023    Assessment & Plan  Nephrolithiasis  Long-standing nephrolithiasis with multiple episodes over 6-7 years. Most recent episode in November 2024 with a 3 mm left UVJ stone and hydronephrosis, resolved by December 2024. Currently has a 2 mm stone in the left kidney. No family history of kidney stones. Discussed hydration, dietary citrate, and salt reduction to prevent future stones. Considered 24-hour urine collection to identify specific risk factors. Patient is highly motivated to prevent future stones due to severe pain experienced during past episodes.  - Recommend increased hydration to produce 2 liters of urine per day  - Increase dietary citrate intake (limes, lemons, low-calorie crystal light)  - Reduce salt intake  - Order 24-hour urine collection  - Schedule follow-up in 3 months to discuss results    Prostate Cancer (Gleason Score)  Prostate cancer with Gleason follicles, status post EBRT and brachytherapy with boost. Currently followed by radiation oncology.  - Continue follow-up with radiation oncology        History of Present Illness  Harold Richards is a 68 year old male with nephrolithiasis who presents for follow-up after a recent kidney stone episode.    In November 2024, he presented to the [facility] emergency room with a 3 mm left UVJ stone associated with hydronephrosis. He was treated with medical expulsive therapy, and a follow-up ultrasound in December 2024 showed resolution of the hydronephrosis. He did not visually confirm the passage of the stone despite using a strainer.    He has a long-standing history of nephrolithiasis, having experienced five to six stone events over the past six to seven years. His first episode occurred while at work, leading to severe pain and a hospital visit.     No family history of kidney stones.    During the recent episode, he developed an infection that required treatment with three different antibiotics over a three-week period before resolution. He has never required surgical intervention for his stones, although there was consideration for surgery with the last stone due to its location.    He has a history of cirrhosis of the liver and is under the care of a liver specialist. He takes Lasix as needed for leg swelling, which he has not used recently. He follows a general diet without specific restrictions for his cirrhosis.    His urologic history also includes prostate cancer treated with EBRT and brachytherapy, and he is currently followed by radiation oncology.    Past history reviewed and unchanged    Medications:  Current Outpatient Medications   Medication Sig Dispense Refill    albuterol HFA 90 mcg/actuation inhaler Inhale 2 puffs every four (4) hours as needed for wheezing. 8 g 3    amoxicillin-clavulanate (AUGMENTIN) 500-125 mg per tablet Take 1 tablet by mouth Three (3) times a day.      aspirin (ECOTRIN) 81 MG tablet Take 1 tablet (81 mg total) by mouth daily.      atorvastatin (LIPITOR) 20 MG tablet TAKE 1 TABLET(20 MG) BY MOUTH EVERY NIGHT 90 tablet 3    buprenorphine-naloxone (SUBOXONE) 8-2 mg sublingual film Place 1 Film (8 mg of buprenorphine total) under the tongue every twelve (12) hours. 30 Film 0    [START ON 10/20/2023] buprenorphine-naloxone (SUBOXONE) 8-2 mg sublingual film Place 1 Film (8 mg of buprenorphine total) under the tongue two (2) times a day. 60 Film  0    cholecalciferol, vitamin D3-25 mcg, 1,000 unit,, 25 mcg (1,000 unit) capsule Take 1 capsule (25 mcg total) by mouth daily.      fluticasone-umeclidin-vilanter (TRELEGY ELLIPTA) 100-62.5-25 mcg inhaler Inhale 1 puff daily. 90 each 3    furosemide (LASIX) 20 MG tablet Take 1 tablet (20 mg total) by mouth daily as needed for swelling. 30 tablet 6    lactulose 10 gram/15 mL solution Take 15 mL (10 g total) by mouth two (2) times a day. 900 mL 6    loratadine (CLARITIN) 10 mg tablet Take 1 tablet (10 mg total) by mouth daily.      methylPREDNISolone (MEDROL DOSEPACK) 4 mg tablet FOLLOW PACKAGE DIRECTIONS      mirtazapine (REMERON) 7.5 MG tablet Take 1 tablet (7.5 mg total) by mouth nightly. 90 tablet 0    naloxone (NARCAN) 4 mg nasal spray One spray in either nostril once for known/suspected opioid overdose. May repeat every 2-3 minutes in alternating nostril til EMS arrives 2 each PRN    ondansetron (ZOFRAN-ODT) 4 MG disintegrating tablet TAKE 1 TABLET BY MOUTH EVERY 12 HOURS AS NEEDED FOR NAUSEA      pantoprazole (PROTONIX) 40 MG tablet TAKE 1 TABLET(40 MG) BY MOUTH EVERY NIGHT 90 tablet 3    rifAXIMin (XIFAXAN) 550 mg Tab Take 1 tablet (550 mg total) by mouth Two (2) times a day. 180 tablet 1    tamsulosin (FLOMAX) 0.4 mg capsule Take 1 capsule (0.4 mg total) by mouth daily. 90 capsule 3     No current facility-administered medications for this visit.       ROS:        BP 154/58 (BP Position: Sitting)  - Pulse 76  - Wt 53.5 kg (118 lb)  - BMI 19.05 kg/m??     Physical Exam:    General: well developed, well nourished, no acute distress  HEENT: normocephalic, atraumatic  RESPIRATORY: non-labored breathing  CVD no peripheral edema   MSK: no cyanosis, normal gait  SKIN: no obvious rashes, lesions or ulcers   GU:     Physical Exam      Most Recent Labs:  Results      RADIOLOGY  CT scan: 3 mm left UVJ stone with associated hydronephrosis (06/2023)  Ultrasound: Resolution of hydronephrosis (07/2023)  CT scan: Small stone in left kidney, approximately 2 mm    PATHOLOGY  Pathology report: Calcium oxalate stone (2013)    Imaging:  US Renal Complete  Result Date: 07/26/2023  EXAM: US RENAL COMPLETE ACCESSION: [id number] UN REPORT DATE: 07/25/2023 4:12 PM CLINICAL INDICATION: 68 years old with follow - up for left hydroureteronephrosis due to obstructing stone  - N20.0 - Recurrent nephrolithiasis - N13.30 - Hydroureteronephrosis  COMPARISON: CT abdomen and pelvis 07/08/2023. TECHNIQUE: Static and cine images of the kidneys and bladder were performed. FINDINGS: KIDNEYS: Normal size and echogenicity. A few tiny punctate nonobstructing stones in both kidneys measuring up to 0.3 cm.. No hydronephrosis. A few tiny cysts measuring up to 0.6 cm in both kidneys..      Right kidney: 10.8 cm      Left kidney: 10.4 cm BLADDER: Mildly distended limiting assessment.      Bladder volume prevoid: 64.31 mL      --No evidence of hydronephrosis. Previously identified left UVJ stone has likely passed.

## 2023-10-04 ENCOUNTER — Ambulatory Visit: Admit: 2023-10-04 | Discharge: 2023-10-05 | Payer: MEDICARE

## 2023-10-04 DIAGNOSIS — N200 Calculus of kidney: Principal | ICD-10-CM

## 2023-10-06 DIAGNOSIS — K7460 Unspecified cirrhosis of liver: Principal | ICD-10-CM

## 2023-10-10 NOTE — Unmapped (Unsigned)
 Otolaryngology Return Clinic Visit Note      Reason for Visit:  Follow-up     History of Present Illness    The patient is a 68 y.o. male who presents for follow-up for ear bleeding after ear cleaning.     The patient denies fevers, chills, shortness of breath, chest pain, nausea, vomiting, diarrhea, inability to lie flat, dysphagia, odynophagia, hemoptysis, hematemesis, changes in vision, changes in voice quality, otalgia, otorrhea, vertiginous symptoms, focal deficits, or other concerning symptoms.    Review of Systems    A 12 system review of systems was performed and is negative other than that noted in the history of present illness.    Vital Signs  There were no vitals taken for this visit.    Physical Exam    General: Well-developed, well-nourished. Appropriate, comfortable, and in no apparent distress.  Voice: clear   Head/Face: On external examination there is no obvious asymmetry or scars. On palpation there is no tenderness over maxillary sinuses or masses within the salivary glands. Cranial nerves V and VII are intact through all distributions.  Eyes: PERRL, EOMI, the conjunctiva are not injected and sclera is non-icteric.  Ears:   Right ear: On external exam, there is no obvious lesions or asymmetry. No cerumen. No lesions in the EAC. The TM is in the neutral position and mobile to pneumatic otoscopy. No middle ear masses or fluid noted.   Hearing is grossly intact.  Left ear: On external exam, there is no obvious lesions or asymmetry. No cerumen. No lesions in the EAC. The TM is in the neutral position and mobile to pneumatic otoscopy. No middle ear masses or fluid noted.   Hearing is grossly intact.  Nose: No external masses, lesions, or deformity. Anterior rhinoscopy of nasal mucosa, septum, and turbinates reveals no abnormalities.   Oral cavity/oropharynx: The mucosa of the lips, gums, hard and soft palate, posterior pharyngeal wall, tongue, floor of mouth, and buccal region are without masses or lesions and are normally hydrated. Good dentition. Tongue protrudes midline. Tonsils are symmetric without any masses or lesions. Supraglottis not visualized due to gag reflex.  Neck: There is no asymmetry or masses. Trachea is midline. There is no enlargement of the thyroid or palpable thyroid nodules.   Lymphatics: There is no palpable lymphadenopathy along the jugulodiagastric, submental, or posterior cervical chains.  Chest: No audible wheeze, unlabored respirations.  Neurologic: Cranial nerve???s II-XII are grossly intact. Exam is non-focal.  Extremities: No cyanosis, clubbing or edema.    Procedures:.    Audiogram  The tympanogram was personally reviewed    The audiogram was also personally reviewed.     These demonstrate a ***    Imaging  I personally reviewed the patients imaging studies. ***      Assessment/Recommendations:    The patient is a 68 y.o. male who presents for ear bleeding.     The patient voiced complete understanding of plan as detailed above and is in full agreement.

## 2023-10-11 NOTE — Unmapped (Signed)
 Gastroenterology Return Visit Note      Initial Referring Provider:  Pcp, None Per Patient  [address] [place],  [place] [address]    Primary Care Provider:  Roma Schanz, MD    Harold Richards is a 68 y.o. male (DOB: [birth year]) who returns for HCV liver cirrhosis follow up.   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 Diagnosed in 2015 on fibroscan. HCV cured in 2016 with Harvoni. MELD 8. Chronic, stable.   -HE: Continue lactulose 15 ml bid and Xifaxan 550 mg bid  -Labs: CBC. AFP, CMP, INR ordered to evaluate liver function and other health problems.  -HCC screening: MRI ordered for liver cancer screen ( LR-3 lesions).   -Variceal Screening: EGD 11/2019 without varices. Repeat upper endoscopy not necessarily given PLT count >150 and fibroscan <20 kPa, stable in HGB, no signs of GI bleeding.  - Follow up in 6 months or as needed.     #Anemia: Chronic, stable. No GI bleeding signs. Colonoscopy with non bleeding internal hemorrhoids.          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 His HCV was cured in 2016 after completing 12 weeks of Harvoni therapy. 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  The patient has been doing well since his last visit, with no significant changes in appetite, sleep, activity tolerance, or weight. He is able to tolerate daily activities and exercise without issues, and reports no heartburn, indigestion, or bloating. He has 3-5 bowel movements per day, with normal color. The patient denies jaundice, skin rashes, pruritus, fatigue, shortness of breath (SOB), chest or abdominal pain, hematemesis, melena, confusion, nausea, vomiting, or diarrhea.  He discontinued rifaximin due to cost and insurance coverage issues and is working on refilling the prescription. The patient also reports right ear bleeding and declining hearing, and is being treated by Otolaryngology.    CURRENT MEDICATIONS:      Current Outpatient Medications:     albuterol HFA 90 mcg/actuation inhaler, Inhale 2 puffs every four (4) hours as needed for wheezing., Disp: 8 g, Rfl: 3    amoxicillin-clavulanate (AUGMENTIN) 500-125 mg per tablet, Take 1 tablet by mouth Three (3) times a day., Disp: , Rfl:     aspirin (ECOTRIN) 81 MG tablet, Take 1 tablet (81 mg total) by mouth daily., Disp: , Rfl:     atorvastatin (LIPITOR) 20 MG tablet, TAKE 1 TABLET(20 MG) BY MOUTH EVERY NIGHT, Disp: 90 tablet, Rfl: 3    buprenorphine-naloxone (SUBOXONE) 8-2 mg sublingual film, Place 1 Film (8 mg of buprenorphine total) under the tongue every twelve (12) hours., Disp: 30 Film, Rfl: 0    [START ON 10/20/2023] buprenorphine-naloxone (SUBOXONE) 8-2 mg sublingual film, Place 1 Film (8 mg of buprenorphine total) under the tongue two (2) times a day., Disp: 60 Film, Rfl: 0    cholecalciferol, vitamin D3-25 mcg, 1,000 unit,, 25 mcg (1,000 unit) capsule, Take 1 capsule (25 mcg total) by mouth daily., Disp: , Rfl:  fluticasone-umeclidin-vilanter (TRELEGY ELLIPTA) 100-62.5-25 mcg inhaler, Inhale 1 puff daily., Disp: 90 each, Rfl: 3    furosemide (LASIX) 20 MG tablet, Take 1 tablet (20 mg total) by mouth daily as needed for swelling., Disp: 30 tablet, Rfl: 6    lactulose 10 gram/15 mL solution, Take 15 mL (10 g total) by mouth two (2) times a day., Disp: 900 mL, Rfl: 6    loratadine (CLARITIN) 10 mg tablet, Take 1 tablet (10 mg total) by mouth daily., Disp: , Rfl:     methylPREDNISolone (MEDROL DOSEPACK) 4 mg tablet, FOLLOW PACKAGE DIRECTIONS, Disp: , Rfl:     mirtazapine (REMERON) 7.5 MG tablet, Take 1 tablet (7.5 mg total) by mouth nightly., Disp: 90 tablet, Rfl: 0    naloxone (NARCAN) 4 mg nasal spray, One spray in either nostril once for known/suspected opioid overdose. May repeat every 2-3 minutes in alternating nostril til EMS arrives, Disp: 2 each, Rfl: PRN    ondansetron (ZOFRAN-ODT) 4 MG disintegrating tablet, TAKE 1 TABLET BY MOUTH EVERY 12 HOURS AS NEEDED FOR NAUSEA, Disp: , Rfl:     pantoprazole (PROTONIX) 40 MG tablet, TAKE 1 TABLET(40 MG) BY MOUTH EVERY NIGHT, Disp: 90 tablet, Rfl: 3    rifAXIMin (XIFAXAN) 550 mg Tab, Take 1 tablet (550 mg total) by mouth Two (2) times a day., Disp: 180 tablet, Rfl: 1    tamsulosin (FLOMAX) 0.4 mg capsule, Take 1 capsule (0.4 mg total) by mouth daily., Disp: 90 capsule, Rfl: 3    Current Outpatient Medications:     albuterol HFA 90 mcg/actuation inhaler, Inhale 2 puffs every four (4) hours as needed for wheezing., Disp: 8 g, Rfl: 3    amoxicillin-clavulanate (AUGMENTIN) 500-125 mg per tablet, Take 1 tablet by mouth Three (3) times a day., Disp: , Rfl:     aspirin (ECOTRIN) 81 MG tablet, Take 1 tablet (81 mg total) by mouth daily., Disp: , Rfl:     atorvastatin (LIPITOR) 20 MG tablet, TAKE 1 TABLET(20 MG) BY MOUTH EVERY NIGHT, Disp: 90 tablet, Rfl: 3    buprenorphine-naloxone (SUBOXONE) 8-2 mg sublingual film, Place 1 Film (8 mg of buprenorphine total) under the tongue every twelve (12) hours., Disp: 30 Film, Rfl: 0    [START ON 10/20/2023] buprenorphine-naloxone (SUBOXONE) 8-2 mg sublingual film, Place 1 Film (8 mg of buprenorphine total) under the tongue two (2) times a day., Disp: 60 Film, Rfl: 0    cholecalciferol, vitamin D3-25 mcg, 1,000 unit,, 25 mcg (1,000 unit) capsule, Take 1 capsule (25 mcg total) by mouth daily., Disp: , Rfl:     fluticasone-umeclidin-vilanter (TRELEGY ELLIPTA) 100-62.5-25 mcg inhaler, Inhale 1 puff daily., Disp: 90 each, Rfl: 3    furosemide (LASIX) 20 MG tablet, Take 1 tablet (20 mg total) by mouth daily as needed for swelling., Disp: 30 tablet, Rfl: 6    lactulose 10 gram/15 mL solution, Take 15 mL (10 g total) by mouth two (2) times a day., Disp: 900 mL, Rfl: 6    loratadine (CLARITIN) 10 mg tablet, Take 1 tablet (10 mg total) by mouth daily., Disp: , Rfl:     methylPREDNISolone (MEDROL DOSEPACK) 4 mg tablet, FOLLOW PACKAGE DIRECTIONS, Disp: , Rfl:     mirtazapine (REMERON) 7.5 MG tablet, Take 1 tablet (7.5 mg total) by mouth nightly., Disp: 90 tablet, Rfl: 0    naloxone (NARCAN) 4 mg nasal spray, One spray in either nostril once for known/suspected opioid overdose. May repeat every 2-3 minutes in alternating nostril til EMS arrives, Disp:  2 each, Rfl: PRN    ondansetron (ZOFRAN-ODT) 4 MG disintegrating tablet, TAKE 1 TABLET BY MOUTH EVERY 12 HOURS AS NEEDED FOR NAUSEA, Disp: , Rfl:     pantoprazole (PROTONIX) 40 MG tablet, TAKE 1 TABLET(40 MG) BY MOUTH EVERY NIGHT, Disp: 90 tablet, Rfl: 3    rifAXIMin (XIFAXAN) 550 mg Tab, Take 1 tablet (550 mg total) by mouth Two (2) times a day., Disp: 180 tablet, Rfl: 1    tamsulosin (FLOMAX) 0.4 mg capsule, Take 1 capsule (0.4 mg total) by mouth daily., Disp: 90 capsule, Rfl: 3       VITAL SIGNS:    BP 132/64 (BP Site: L Arm, BP Position: Sitting)  - Pulse 69  - Temp 36.4 ??C (97.5 ??F)  - Ht 167.6 cm (5' 6)  - Wt 53.5 kg (118 lb)  - SpO2 94%  - BMI 19.05 kg/m??       PHYSICAL EXAM:    Constitutional:   Alert, oriented x 3, no acute distress, well nourished, hydrated.   Mental Status:   Thought organized, appropriate affect, pleasantly interactive,.   HEENT:   PERRL, conjunctiva clear, anicteric, oropharynx clear, neck supple, no LAD. Right ear cannel saw blood clot and scar.   Respiratory: Clear to auscultation, unlabored breathing.     Cardiac: Euvolemic, regular rate and rhythm, normal S1 and S2, no murmur.     Abdomen: Soft, normal bowel sounds, non-distended, non-tender, no organomegaly or masses.     Skin: No rashes, jaundice or skin lesions noted.     Neuro: No focal deficits, asterixis negative . Righ hearing impaired. Asterixis negative         DIAGNOSTIC STUDIES:  I have reviewed all pertinent diagnostic studies, including:    GI Procedures:    09/01/21 Colonoscopy: Non-bleeding internal hemorrhoids   Radiographic studies:     03/28/23   Abd MRI:   1.  Numerous LR-3 observations throughout the liver.   2.  Small caliber perigastric varices, as seen with portal hypertension.     Laboratory results    MELD 3.0: 7 at 10/12/2023  9:23 AM  MELD-Na: 8 at 03/06/2023  1:01 PM  Calculated from:  Serum Creatinine: 0.89 mg/dL (Using min of 1 mg/dL) at 08/13/1094  0:45 AM  Serum Sodium: 142 mmol/L (Using max of 137 mmol/L) at 10/12/2023  9:23 AM  Total Bilirubin: 0.2 mg/dL (Using min of 1 mg/dL) at 4/0/9811  9:14 AM  Serum Albumin: 3.7 g/dL (Using max of 3.5 g/dL) at 02/13/2955  2:13 AM  INR(ratio): 1.12 at 10/12/2023  9:23 AM  Age at listing (hypothetical): 67 years  Sex: Male at 10/12/2023  9:23 AM

## 2023-10-12 ENCOUNTER — Ambulatory Visit: Admit: 2023-10-12 | Discharge: 2023-10-13 | Payer: MEDICARE

## 2023-10-12 DIAGNOSIS — K7682 Hepatic encephalopathy (CMS-HCC): Principal | ICD-10-CM

## 2023-10-12 DIAGNOSIS — C61 Malignant neoplasm of prostate: Principal | ICD-10-CM

## 2023-10-12 DIAGNOSIS — G9340 Encephalopathy, unspecified: Principal | ICD-10-CM

## 2023-10-12 DIAGNOSIS — K7460 Unspecified cirrhosis of liver: Principal | ICD-10-CM

## 2023-10-12 LAB — COMPREHENSIVE METABOLIC PANEL
ALBUMIN: 3.7 g/dL (ref 3.4–5.0)
ALKALINE PHOSPHATASE: 47 U/L (ref 46–116)
ALT (SGPT): 13 U/L (ref 10–49)
ANION GAP: 10 mmol/L (ref 5–14)
AST (SGOT): 26 U/L (ref <=34)
BILIRUBIN TOTAL: <0.2 mg/dL — ABNORMAL LOW (ref 0.3–1.2)
BLOOD UREA NITROGEN: 14 mg/dL (ref 9–23)
BUN / CREAT RATIO: 16
CALCIUM: 9.1 mg/dL (ref 8.7–10.4)
CHLORIDE: 108 mmol/L — ABNORMAL HIGH (ref 98–107)
CO2: 24.2 mmol/L (ref 20.0–31.0)
CREATININE: 0.89 mg/dL (ref 0.73–1.18)
EGFR CKD-EPI (2021) MALE: >90 mL/min/{1.73_m2} (ref >=60)
GLUCOSE RANDOM: 123 mg/dL (ref 70–179)
POTASSIUM: 3.7 mmol/L (ref 3.4–4.8)
PROTEIN TOTAL: 6.8 g/dL (ref 5.7–8.2)
SODIUM: 142 mmol/L (ref 135–145)

## 2023-10-12 LAB — CBC
HEMATOCRIT: 37.2 % — ABNORMAL LOW (ref 39.0–48.0)
HEMOGLOBIN: 13.2 g/dL (ref 12.9–16.5)
MEAN CORPUSCULAR HEMOGLOBIN CONC: 35.3 g/dL (ref 32.0–36.0)
MEAN CORPUSCULAR HEMOGLOBIN: 36 pg — ABNORMAL HIGH (ref 25.9–32.4)
MEAN CORPUSCULAR VOLUME: 101.8 fL — ABNORMAL HIGH (ref 77.6–95.7)
MEAN PLATELET VOLUME: 6.6 fL — ABNORMAL LOW (ref 6.8–10.7)
PLATELET COUNT: 208 10*9/L (ref 150–450)
RED BLOOD CELL COUNT: 3.66 10*12/L — ABNORMAL LOW (ref 4.26–5.60)
RED CELL DISTRIBUTION WIDTH: 12.1 % — ABNORMAL LOW (ref 12.2–15.2)
WBC ADJUSTED: 6.7 10*9/L (ref 3.6–11.2)

## 2023-10-12 LAB — PROTIME-INR
INR: 1.12
PROTIME: 12.8 s — ABNORMAL HIGH (ref 9.9–12.6)

## 2023-10-12 LAB — AFP TUMOR MARKER: AFP-TUMOR MARKER: 2 ng/mL (ref <=8)

## 2023-10-12 MED ORDER — LACTULOSE 10 GRAM/15 ML ORAL SOLUTION
Freq: Two times a day (BID) | ORAL | 6 refills | 30.00 days | Status: CP
Start: 2023-10-12 — End: ?

## 2023-10-12 MED ORDER — RIFAXIMIN 550 MG TABLET
ORAL_TABLET | Freq: Two times a day (BID) | ORAL | 1 refills | 90.00 days | Status: CP
Start: 2023-10-12 — End: 2023-10-12

## 2023-10-12 NOTE — Unmapped (Signed)
--  great to see you   --labs today to check liver function  --MRI due in August/ September (ordered)   --if you need extra doses of lactulose for mental confusion, you can take as many as needed to remain clear headed.   --continue to work with our pharmacy to get xifaxan covered affordably, let me know if I can assist in any way

## 2023-10-20 MED ORDER — BUPRENORPHINE 8 MG-NALOXONE 2 MG SUBLINGUAL FILM
ORAL_FILM | Freq: Every day | SUBLINGUAL | 0 refills | 30.00000 days | Status: CP
Start: 2023-10-20 — End: 2023-11-19

## 2023-10-28 LAB — LITHOLINK 24 HR UR KS PREV PNL
AMMONIUM URINE: 24 mmol/(24.h) (ref 15–60)
CALCIUM OXALATE SATURATION: 7.35 (ref 6.00–10.00)
CALCIUM PHOSPHATE SATURATION: 0.73 (ref 0.50–2.00)
CALCIUM, URINE: 125 mg/(24.h)
CALCIUM/CREATININE RATIO: 112 mg/g{creat} (ref 34–196)
CALCIUM/KG BODY WEIGHT: 2.5 mg/kg/d
CHLORIDE, URINE: 186 mmol/(24.h) (ref 70–250)
CITRATE URINE: 933 mg/(24.h)
CREATININE/KG BODY WEIGHT: 22.2 mg/kg/d (ref 11.9–24.4)
CYSTINE URINE: NEGATIVE
MAGNESIUM, URINE: 130 mg/(24.h) — ABNORMAL HIGH (ref 30–120)
PH, 24 HR URINE: 5.598 — ABNORMAL LOW (ref 5.800–6.200)
PHOSPHORUS, URINE: 1064 mg/(24.h) (ref 600–1200)
POTASSIUM, URINE: 112 mmol/(24.h) — ABNORMAL HIGH (ref 20–100)
PROTEIN CATABOLIC RATE: 0.9 g/kg/d (ref 0.8–1.4)
SODIUM, URINE: 146 mmol/(24.h) (ref 50–150)
SULFATE URINE: 16 meq/(24.h) — ABNORMAL LOW (ref 20–80)
UREA NITROGEN, URINE: 5.72 g/(24.h) — ABNORMAL LOW (ref 6.00–14.00)
URIC ACID SATURATION: 1.25 — ABNORMAL HIGH
URIC ACID, URINE: 336 mg/(24.h)
URINE VOLUME: 1040 mL/(24.h) (ref 500–4000)

## 2023-10-30 ENCOUNTER — Ambulatory Visit: Admit: 2023-10-30 | Discharge: 2023-10-31 | Payer: MEDICARE

## 2023-10-30 DIAGNOSIS — H903 Sensorineural hearing loss, bilateral: Principal | ICD-10-CM

## 2023-10-30 DIAGNOSIS — H9223 Otorrhagia, bilateral: Principal | ICD-10-CM

## 2023-10-30 NOTE — Unmapped (Signed)
 For [facility] Patients   For nursing questions, please call Dr. Brayton Caves nurse, Asha at [phone].  To schedule surgery, please call Rolm Bookbinder at [phone].   For Financial counseling, [phone] for imaging and surgery estimates   For Financial assistance [phone] (discuss charity care application)   For [facility] radiology scheduling, please call [phone].   To schedule appointments: [phone]   Office fax: [phone].  For Urgent Medical Advice/Triage Nurse during normal business hours (Mon-Fri 8:15am-4:00 pm), please call [phone].       For [facility] Patients   For nursing questions, surgery scheduling issues or appointments, please call [phone].  For [facility] radiology scheduling, please call [phone].  For [facility] estimates, please call [phone].    Office fax: [phone].    [facility] Radiology Scheduling in [place]   [phone]    After Hours for both places   After hours, please call [phone] and ask for the ENT physician on call.      MyChart is for non-urgent messages.  Please allow for 2-3 business days for a response. Please call the clinic for any urgent messages or concerns.

## 2023-10-30 NOTE — Unmapped (Signed)
 Otolaryngology Return Clinic Visit Note    Reason for Visit:  Follow-up     History of Present Illness    The patient is a 68 y.o. male who presents for follow-up for ear bleeding after ear cleaning. He reports that he is doing well since previous visit. He thinks the antibiotic drops in his ear provided him with relief. He is interested in getting hearing aids soon as he notices that he has difficulty hearing family members. He appreciates intermittent accumulation of cerumen, which he comes in to get cleaned.       Review of Systems    A 12 system review of systems was performed and is negative other than that noted in the history of present illness.    Vital Signs  Temperature 36.2 ??C (97.1 ??F), weight 53 kg (116 lb 12.8 oz).    Physical Exam    General: Well-developed, well-nourished. Appropriate, comfortable, and in no apparent distress.  Voice: clear   Head/Face: On external examination there is no obvious asymmetry or scars. On palpation there is no tenderness over maxillary sinuses or masses within the salivary glands. Cranial nerves V and VII are intact through all distributions.  Eyes: PERRL, EOMI, the conjunctiva are not injected and sclera is non-icteric.  Ears:   Right ear: On external exam, there is no obvious lesions or asymmetry. Tried blood removed from [facility] but no cerumen. No lesions in the EAC. The TM is in the neutral position and mobile to pneumatic otoscopy. No middle ear masses or fluid noted.   Hearing is grossly intact.  Left ear: On external exam, there is no obvious lesions or asymmetry. No cerumen. No lesions in the EAC. The TM is in the neutral position and mobile to pneumatic otoscopy. No middle ear masses or fluid noted.   Hearing is grossly intact.  Nose: No external masses, lesions, or deformity. Anterior rhinoscopy of nasal mucosa, septum, and turbinates reveals no abnormalities.   Chest: No audible wheeze, unlabored respirations.  Neurologic: Cranial nerve???s II-XII are grossly intact. Exam is non-focal.  Extremities: No cyanosis, clubbing or edema.    Outside Medical Records  I personally reviewed patient's medical records.     Assessment/Recommendations:    The patient is a 68 y.o. male who presents for ear bleeding. There was minimal dry blood seen in the right ear on exam today, which was removed without issue. I also suggested that have impacted cerumen removed yearly. Agree with hearing aids for hearing loss. Follow-up with me in 12 months or sooner as needed.    The patient voiced complete understanding of plan as detailed above and is in full agreement.    Scribe's Attestation: Darliss Ridgel, MD obtained and performed the history, physical exam and medical decision making elements that were entered into the chart. Signed by Donzetta Starch, Scribe, on October 30, 2023 at 8:57 AM.

## 2023-11-14 ENCOUNTER — Ambulatory Visit: Admit: 2023-11-14 | Discharge: 2023-11-15

## 2023-11-14 DIAGNOSIS — F1111 Opioid abuse, in remission: Principal | ICD-10-CM

## 2023-11-14 LAB — TOXICOLOGY SCREEN, URINE
AMPHETAMINE SCREEN URINE: NEGATIVE
BARBITURATE SCREEN URINE: NEGATIVE
BENZODIAZEPINE SCREEN, URINE: NEGATIVE
BUPRENORPHINE, URINE SCREEN: POSITIVE — AB
CANNABINOID SCREEN URINE: NEGATIVE
COCAINE(METAB.)SCREEN, URINE: NEGATIVE
METHADONE SCREEN, URINE: NEGATIVE
OPIATE SCREEN URINE: NEGATIVE
OXYCODONE SCREEN URINE: NEGATIVE

## 2023-11-14 NOTE — Unmapped (Signed)
 Allergies:  - Switch Zyrtec to see if helps better with allergies.  Just get the generic. Take once a day at night everyday for the next 1-2 weeks.   -Take Flonase nose spray, one spray in each nostril once a day for the next 1-2 weeks

## 2023-11-15 ENCOUNTER — Ambulatory Visit: Admit: 2023-11-15 | Discharge: 2023-11-16 | Payer: MEDICARE | Attending: Audiologist | Primary: Audiologist

## 2023-11-15 NOTE — Unmapped (Signed)
 [facility]  Adult Audiology     HEARING AID CONSULTATION     PATIENT: Harold, Richards  DOB: 1956/05/31  MRN: [id number]  DOS: 11/15/2023    Harold Richards presented to clinic today for a consultation visit today to discuss amplification options. Patient was accompanied by Harold Richards to today's appointment.      SUBJECTIVE REPORT     Patient identified the following listening needs/goals:  Conversation with 1 or 2 in quiet  Conversation with 1 or 2 in noise  Conversation with group in noise    CLINICAL OBSERVATIONS     Considerations reported today that should be taken into account when considering hearing aid options:  []  Manual dexterity challenges  [x]  Visual acuity - wears eyeglasses  []  Known cognitive concerns (i.e., memory loss)    IMPRESSIONS     Discussed amplification style and technology level best suited for patient's lifestyle. Due to reported concerns/considerations as well as patient listening needs/goals, the following features were identified as important to the patient:  [x]  Hearing Aid style: RIC/RITE  [x]  Rechargeable batteries  [x]  Bluetooth connectivity. Patient has the following smartphone model: iPhone  []  Accessories: TV streamer, Remote microphone    PLAN     The following hearing aid manufacturers and models were discussed today:   Signia Pure Charge and Go 3IX RIC/RITE  Signia Pure Charge and Go 5IX RIC/RITE  Oticon Intent 2 RIC/RITE    Patient provided with literature from the [facility]).     ORDERING     Bilateral Amplification:     Right Ear Left Ear   Order Date 11/15/2023   Location [place]   Manufacturer Signia   Model Pure Charge & Go 3IX   Color Black    Serial Number     HAF Date     Warranty     Receiver strength and size 33M 33M   Dome medium VENTED medium VENTED   Bluetooth    Battery Rechargeable   Accessory(s)    Accessory Warranty    Charger Serial Number    Charger Warranty    Loss and Damage Claim  available available   CPT Codes V5261 - HEARING AID DIGITAL BINAURAL BTE  V5011 - HEARING AID FITTING ORIENTATION        COUNSELING     Harold Richards was counseled regarding realistic expectations and the role of the audiologist.    A price quote was signed and provided to patient. Patient was provided contact information for our financial counselor should Harold Richards have any financial concerns or questions.    Policies:  A payment of at least half the total cost is expected on the day of the fitting. Payment plans can be arranged with our financial counselor.   A 30-day trial period is provided with the hearing aid(s). If hearing aids are returned during the 30-day trial period in good working order, the purchase price of the hearing aid(s) and/or accessories will be returned to you unless other account balances exist at [facility] and/or [facility]. If the hearing aid(s) are lost or damaged within the trial period, the hearing aid(s) cannot be returned.   The cost of the fitting fee(s) is non-refundable  The cost of earmold(s) is non-refundable      RECOMMENDATIONS      Consider trial with amplification pending medical clearance and patient motivation    Continue to monitor hearing annually   Patient has a hearing aid fitting  appointment scheduled for 12/20/2023.     Harold Richards, B.S.  Audiology Graduate Student Clinician    I was physically present and immediately available to direct and supervise tasks that were related to patient management. The direction and supervision was continuous throughout the time these tasks were performed.    Charges associated with today's visit:  No Charge - Quantity: 4    Visit Time: 60 min    Harold Richards, AUD, AuD  Clinical Audiologist  [facility]  Scheduling: [phone]

## 2023-11-16 NOTE — Unmapped (Signed)
 Chief concern/reason for visit:  MOUD follow-up, allergic rhinitis     Assessment/Plan:      Assessment & Plan  Allergic Rhinitis  Nasal congestion and headache likely due to seasonal allergies. Claritin ineffective. Advised Zyrtec and Flonase for relief. Provided nasal spray instructions.  - Switch to Zyrtec daily, take generic version at night for 1-2 weeks.  - Use Flonase nasal spray, one spray in each nostril once a day.  - Schedule follow-up in 2 months via video visit.    Nephrolithiasis  Currently asymptomatic after recent renal colic episode complicated by infection. Kidney stones with urology recommendation to increase fluid intake. Completed 24-hour urine test.  - Follow up with urology nurse practitioner to discuss urine test results and management plan.    Substance Use Disorder  On Suboxone. Discussed ensuring medication availability during October travel plans.  Patient IMCMATSTABILITY: is stable on maintenance therapy [email or web address]: 2 month follow up, next visit virtual    PRESCRIPTIONS GIVEN TODAY:    Medications ordered during this encounter   Medications    buprenorphine-naloxone (SUBOXONE) 8-2 mg sublingual film     Sig: Place 1 Film (8 mg of buprenorphine total) under the tongue two (2) times a day.     Dispense:  60 Film     Refill:  0    buprenorphine-naloxone (SUBOXONE) 8-2 mg sublingual film     Sig: Place 1 Film (8 mg of buprenorphine total) under the tongue every twelve (12) hours.     Dispense:  30 Film     Refill:  0     To use with prior prescription that was incorrectly written for 1 film daily instead of BID      - Benefits of treatment outweigh the risks  - Utox today: will be positive for:  - PDMP reviewed: Appropriate on review today  - Next appt: Return in about 8 weeks (around 01/12/2024) for Video Visit; 6/6 8:20am.  - Substance Abuse Counseling:  not currently  - Does pt have naloxone at home and know how to use it?  yes  - Labs needed today? no    No results found for: HIV12AB, HIVAGAB  Lab Results   Component Value Date    HEPAIGG Nonreactive 07/22/2015    HEPBCAB Reactive (A) 07/22/2015    HEPCAB Positive (A) 03/05/2014     Lab Results   Component Value Date    ALKPHOS 47 10/12/2023    BILITOT <0.2 (L) 10/12/2023    BILIDIR <0.10 12/23/2020    PROT 6.8 10/12/2023    ALBUMIN 3.7 10/12/2023    ALT 13 10/12/2023    AST 26 10/12/2023     No results found for: PREGTESTUR, PREGSERUM, HCG, HCGQUANT    - Suboxone treatment agreement:  has been signed by patient and scanned into record    - Refill Suboxone prescription on April 13th and May 13th.    Hearing Loss  Confirmed hearing loss. Discussed trialing hearing aids before purchase due to cost.  - follow-up with Audiology at scheduled    Tobacco abuse  Smoking half a pack per day. Recent lung cancer screening negative. Urinalysis normal.  - Continue smoking cessation efforts.  - Repeat lung cancer screening in one year.         Return in about 8 weeks (around 01/12/2024) for Video Visit; 6/6 8:20am.        Subjective      HPI:  History of Present Illness  The patient presents with a stuffy  nose and headache, which he attributes to allergies. He recently started taking Claritin, which he believes is starting to help. No fever, chills or cough.   He denies shortness of breath and describes his symptoms as localized to his head. He also reports a hearing impairment and is in the process of getting fitted for hearing aids. He recently saw a urologist for kidney stones and has been advised to increase his fluid intake. He has completed a 24-hour urine test and has a follow-up appointment scheduled. He denies any current urinary symptoms. He also reports improved sleep since starting mirtazapine 7.5mg  at night.        Objective      BP 141/61 (BP Site: L Arm, BP Position: Sitting, BP Cuff Size: Medium)  - Pulse 67  - Temp 36.4 ??C (97.6 ??F) (Temporal)  - Resp 18  - Ht 167.6 cm (5' 6)  - Wt 53.9 kg (118 lb 12.8 oz)  - SpO2 97%  - BMI 19.17 kg/m??      Physical Exam:     Physical Exam      Gen: thin, NAD  CV: RRR, no murmurs  Pulm: CTA bilaterally, no crackles or wheezes  Ext: No edema

## 2023-11-17 LAB — OPIATE, URINE, QUANTITATIVE
6-MONOACETYLMRPH: <5 ng/mL (ref <5)
BUPRENORPHINE: 293 ng/mL — ABNORMAL HIGH (ref <5)
CODEINE GC/MS CONF: <25 ng/mL (ref <25)
FENTANYL, URINE GC/MS: <0.5 ng/mL (ref <0.5)
HYDROCODONE GC/MS CONF: <25 ng/mL (ref <25)
HYDROMORPHONE GC/MS CONF: <25 ng/mL (ref <25)
MORPHINE GC/MS CONF: <25 ng/mL (ref <25)
NORBUPRENORPHINE: 1980 ng/mL — ABNORMAL HIGH (ref <5)
NORFENTANYL, UR GC/MS: <1 ng/mL (ref <1.0)
OPIATE INTERP: POSITIVE
OXYCODONE (GC/MS): <25 ng/mL (ref <25)
OXYMORPHONE: <25 ng/mL (ref <25)

## 2023-11-19 MED ORDER — BUPRENORPHINE 8 MG-NALOXONE 2 MG SUBLINGUAL FILM
ORAL_FILM | Freq: Two times a day (BID) | SUBLINGUAL | 0 refills | 30.00 days | Status: CP
Start: 2023-11-19 — End: 2023-12-19

## 2023-11-20 NOTE — Unmapped (Unsigned)
 ASSESSMENT / PLAN        +++++++++++++++++++++++++++++++++++++++++++++    SUBJECTIVE    New dxa see below    HPI: Harold Richards is a 68 y.o. male with osteoporosis/ cirrhosis/ prostate (lupron) Ca last seen March 2023    Should be 2 years on holiday, and we SHOULD have new DXA, not on chart monday    1. Osteoporosis of lumbar spine   - Multiple contributors potential for spinal osteoporosis - including cirrhosis (testosterone), kidney stones (PTH, renal leak), lupron, smokes 3-4 cigs/day  Made target 10/2021 started holiday      Treatment  2018-22  Reclast  - 2018, 2019, 10/2019, 11/2020  X 4    Bone History  Rib fracture x 2 in 2015 - fell off truck  Still working - does okay.  Is exhausted when he is not at work.  Sexual function is decreased - able to have erection. No change in shaving.  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  Proximal left femur  -1.8. Femoral neck density  -1.7.    2020   L1 to 4  -2.3.   Proximal left femur -1.9.  Femoral neck  -1.9.     09/09/2021  Spine -2.1  Left Total Hip -1.9     Femoral Neck -1.2                  2. Gastroesophageal reflux disease    3. 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 Prostate cancer x2019   Dx: high risk prostate cancer, cT1c, Gleason 4+4=8 in 2/12 cores, PSA 4.59. EBRT x 5 wks + Brachytherapy + 18 mon ADT.    Also Lupron q 3 months.  2023 still stable on 6 month f/u    Pertinent Medical History:   Sialolithiasis of submandibular gland - excised 2016  Positive recreational drug use (cocaine IV, intranasal and crack) --clean x 10 years  Kidney stones -  Last was 2015 x 2 -  No family history. No scan  2017  ALK 50, calcium 9.6, MCV 106, Vitamin D 55    REVIEW OF SYSTEMS:negative except for as stated in HPI.  Note stones.  Hepatitis C - with some degree of compensated cirrhosis but patient does not have any symptoms.    Social History:smoker, married. Grown children 30s   Worked at the [employer] in [place]  Retired in October 2021 - he is quite happy with this.    Wife 2021 dx with breast cancer  Doing a little better.  looks after 2 horses  //  he has 3 grandchildren who he spends a lot of enjoyable time with.    Family history: parents - none with hip fracture - grandmother with hip fracture.  Mother lung cancer death, dad still living age 110  Brother deceased age 5, Sister at age 41 - both car accidents.    Current Outpatient Medications:     albuterol  HFA 90 mcg/actuation inhaler, Inhale 2 puffs every four (4) hours as needed for wheezing., Disp: 8 g, Rfl: 3    aspirin (ECOTRIN) 81 MG tablet, Take 1 tablet (81 mg total) by mouth daily., Disp: , Rfl:     atorvastatin  (LIPITOR) 20 MG tablet, TAKE 1 TABLET(20 MG) BY MOUTH EVERY NIGHT, Disp: 90  tablet, Rfl: 3    buprenorphine -naloxone  (SUBOXONE ) 8-2 mg sublingual film, Place 1 Film (8 mg of buprenorphine  total) under the tongue two (2) times a day., Disp: 60 Film, Rfl: 0    [START ON 12/19/2023] buprenorphine -naloxone  (SUBOXONE ) 8-2 mg sublingual film, Place 1 Film (8 mg of buprenorphine  total) under the tongue every twelve (12) hours., Disp: 30 Film, Rfl: 0    cholecalciferol, vitamin D3-25 mcg, 1,000 unit,, 25 mcg (1,000 unit) capsule, Take 1 capsule (25 mcg total) by mouth daily., Disp: , Rfl:     fluticasone -umeclidin-vilanter (TRELEGY ELLIPTA ) 100-62.5-25 mcg inhaler, Inhale 1 puff daily., Disp: 90 each, Rfl: 3    lactulose  10 gram/15 mL solution, Take 15 mL (10 g total) by mouth two (2) times a day., Disp: 900 mL, Rfl: 6    loratadine (CLARITIN) 10 mg tablet, Take 1 tablet (10 mg total) by mouth once as needed., Disp: , Rfl:     mirtazapine  (REMERON ) 7.5 MG tablet, Take 1 tablet (7.5 mg total) by mouth nightly., Disp: 90 tablet, Rfl: 0    naloxone  (NARCAN ) 4 mg nasal spray, One spray in either nostril once for known/suspected opioid overdose. May repeat every 2-3 minutes in alternating nostril til EMS arrives, Disp: 2 each, Rfl: PRN    ondansetron  (ZOFRAN -ODT) 4 MG disintegrating tablet, TAKE 1 TABLET BY MOUTH EVERY 12 HOURS AS NEEDED FOR NAUSEA, Disp: , Rfl:     pantoprazole  (PROTONIX ) 40 MG tablet, TAKE 1 TABLET(40 MG) BY MOUTH EVERY NIGHT, Disp: 90 tablet, Rfl: 3    tamsulosin  (FLOMAX ) 0.4 mg capsule, Take 1 capsule (0.4 mg total) by mouth daily., Disp: 90 capsule, Rfl: 3    +++++++++++++++++++    OBJECTIVE  There were no vitals taken for this visit.     GENERAL: Patient appears stated age, in NAD  Tremor: no  EOM: Grossly intact.  Skin: normal, with normal hair distribution   NEUROLOGIC: grossly intact.   PSYCHIATRIC: mood and affect were appropriate    Pertinent labs:  Office Visit on 11/14/2023   Component Date Value Ref Range Status    Amphetamines Screen, Ur 11/14/2023 Negative  Negative Final    Barbiturates Screen, Ur 11/14/2023 Negative  Negative Final    Benzodiazepines Screen, Urine 11/14/2023 Negative  Negative Final    Cannabinoids Screen, Ur 11/14/2023 Negative  Negative Final    Methadone Screen, Urine 11/14/2023 Negative  Negative Final    Cocaine(Metab.)Screen, Urine 11/14/2023 Negative  Negative Final    Opiates Screen, Ur 11/14/2023 Negative  Negative Final    Oxycodone  Screen, Ur 11/14/2023 Negative  Negative Final    Buprenorphine , Urine 11/14/2023 Positive (A)  Negative Final    6-Monoacetylmorphine 11/14/2023 <5  <5 ng/mL Final    Morphine  11/14/2023 <25  <25 ng/mL Final    Codeine 11/14/2023 <25  <25 ng/mL Final    Hydrocodone  11/14/2023 <25  <25 ng/mL Final    Hydromorphone  11/14/2023 <25  <25 ng/mL Final    Oxycodone  11/14/2023 <25  <25 ng/mL Final    Oxymorphone 11/14/2023 <25  <25 ng/mL Final    Buprenorphine  11/14/2023 293 (H)  <5 ng/mL Final    Norbuprenorphine 11/14/2023 1,980 (H)  <5 ng/mL Final    Fentanyl  11/14/2023 <0.5  <0.5 ng/mL Final    Norfentanyl 11/14/2023 <1.0  <1.0 ng/mL Final Interpretation 11/14/2023 POSITIVE   Final       Pertinent Radiology:  2020

## 2023-12-04 ENCOUNTER — Ambulatory Visit: Admit: 2023-12-04 | Discharge: 2023-12-05 | Payer: MEDICARE | Attending: "Endocrinology | Primary: "Endocrinology

## 2023-12-04 DIAGNOSIS — C61 Malignant neoplasm of prostate: Principal | ICD-10-CM

## 2023-12-04 DIAGNOSIS — K7469 Other cirrhosis of liver: Principal | ICD-10-CM

## 2023-12-04 DIAGNOSIS — M810 Age-related osteoporosis without current pathological fracture: Principal | ICD-10-CM

## 2023-12-04 NOTE — Unmapped (Addendum)
 You are 2 years out on bone drug holiday.  We need to review a bone density.  Don't wait for them to call - Radiology scheduling: [phone]    And make appointment for density.    Once you have had the density done - give me about 5 days to respond - if I don't, write me a note -- Dr Kendra Pavy - I had my density done and you were going to let me know if I needed to restart treatment    On other note - continue being as active and strong as you can.  Take 1000 vit D3 daily.    We will communicate hopefully before the end of May.  If your density looks good, you do not need to see me in 2026.  You will need a follow up density in 2027 - if you are fee in 2027 you need to remind Dr Corrie Dills to follow your density.

## 2023-12-04 NOTE — Unmapped (Addendum)
 ASSESSMENT / PLAN    1. Osteoporosis of lumbar spine (Primary)  Unfortunately we will have to do this after clinic - I have ordered a density in the next several week s and we will communicate by MyChart.  My decisions will stem from that density - he is now 2 years out on holiday in which he made target. Unclear where he will be    I will addend chart when I have this data - but I will give him a year follow up with decision to not come to that appointment if we are not treating at that time.    He is a good exercisier as he can - generally with more than 15K steps day.    Addendum, 12-08-2023  Bone density results   12/08/2023   Spine -2.4  Left Total Hip -2.4     Femoral Neck -1.6   He has had a substantial decrease in density, and with the prostate cancer//hypogonadism (T=155 in 2022) and cirrhosis, probably best to treat him.  I will offer him start on fosamax, likely for 4 y.    Addendum, 12-12-23  Agreed to fosamax, have sent Rx    2. Prostate cancer  Stable - continues to see Rad oncology    3. Cirrhosis (CMS-HCC)  F/up with GI on medical regimen.    RTC 1 year.    +++++++++++++++++++++++++++++++++++++++++++++    SUBJECTIVE    HPI: Harold Richards is a 68 y.o. male with osteoporosis/ cirrhosis/ prostate (lupron) Ca last seen March 2023    Should be 2 years on holiday, and we SHOULD have new DXA, but he hasn't gotten it    He says he has done pretty well. Liver tests have been stable.  Exercise - walking 15-30K steps day.    1. Osteoporosis of lumbar spine   - Multiple contributors potential for spinal osteoporosis - including cirrhosis (testosterone), kidney stones (PTH, renal leak), lupron, smokes 3-4 cigs/day  Made target 10/2021 started holiday    Treatment  2018-22  Reclast - 2018, 2019, 10/2019, 11/2020  X 4  11/2021 Holiday    Bone History  Rib fracture x 2 in 2015 - fell off truck  Still working - does okay.  Is exhausted when he is not at work.  Sexual function is decreased - able to have erection. No change in shaving.  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  Proximal left femur  -1.8. Femoral neck density  -1.7.    2020   L1 to 4  -2.3.   Proximal left femur -1.9.  Femoral neck  -1.9.     09/09/2021  Spine  -2.1  Left Total Hip -1.9     Femoral Neck -1.2    new  12/08/2023   Spine -2.4  Left Total Hip -2.4     Femoral Neck -1.6            2025                                    2023              2. Gastroesophageal reflux disease    3. 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 Prostate cancer x2019 ([facility])   Dx: high risk prostate cancer, cT1c, Gleason 4+4=8 in 2/12 cores, PSA 4.59. EBRT x 5 wks + Brachytherapy + 18 mon ADT.    2024 going to yearly followup - off lupron   Uses Flomax    Pertinent Medical History:   Sialolithiasis of submandibular gland - excised 2016  Positive recreational drug use (cocaine IV, intranasal and crack) --clean x 10 years  Kidney stones -  Last was 2015 x 2 -  No family history. No scan  2017  ALK 50, calcium 9.6, MCV 106, Vitamin D 55    REVIEW OF SYSTEMS:negative except for as stated in HPI.  Note stones.  Hepatitis C - with some degree of compensated cirrhosis but patient does not have any symptoms.    Social History:smoker, married. Grown children 30s   Worked at the [employer] in [place]  Retired in October 2021 - he is quite happy with this.    Wife 2021 dx with breast cancer  Doing a little better.  looks after 2 horses  //  he has 3 grandchildren who he spends a lot of enjoyable time with.    Family history: parents - none with hip fracture - grandmother with hip fracture.  Mother lung cancer death, dad still living age 56  Brother deceased age 58, Sister at age 60 - both car accidents.    Current Outpatient Medications:     albuterol HFA 90 mcg/actuation inhaler, Inhale 2 puffs every four (4) hours as needed for wheezing., Disp: 8 g, Rfl: 3    aspirin (ECOTRIN) 81 MG tablet, Take 1 tablet (81 mg total) by mouth daily., Disp: , Rfl:     atorvastatin (LIPITOR) 20 MG tablet, TAKE 1 TABLET(20 MG) BY MOUTH EVERY NIGHT, Disp: 90 tablet, Rfl: 3    buprenorphine-naloxone (SUBOXONE) 8-2 mg sublingual film, Place 1 Film (8 mg of buprenorphine total) under the tongue two (2) times a day., Disp: 60 Film, Rfl: 0    cholecalciferol, vitamin D3-25 mcg, 1,000 unit,, 25 mcg (1,000 unit) capsule, Take 1 capsule (25 mcg total) by mouth daily., Disp: , Rfl:     fluticasone-umeclidin-vilanter (TRELEGY ELLIPTA) 100-62.5-25 mcg inhaler, Inhale 1 puff daily., Disp: 90 each, Rfl: 3    lactulose 10 gram/15 mL solution, Take 15 mL (10 g total) by mouth two (2) times a day., Disp: 900 mL, Rfl: 6    loratadine (CLARITIN) 10 mg tablet, Take 1 tablet (10 mg total) by mouth once as needed., Disp: , Rfl:     mirtazapine (REMERON) 7.5 MG tablet, Take 1 tablet (7.5 mg total) by mouth nightly., Disp: 90 tablet, Rfl: 0    ondansetron (ZOFRAN-ODT) 4 MG disintegrating tablet, TAKE 1 TABLET BY MOUTH EVERY 12 HOURS AS NEEDED FOR NAUSEA, Disp: , Rfl:     pantoprazole (PROTONIX) 40 MG tablet, TAKE 1 TABLET(40 MG) BY MOUTH EVERY NIGHT, Disp: 90 tablet, Rfl: 3    tamsulosin (FLOMAX) 0.4 mg capsule, Take 1 capsule (0.4 mg total) by mouth daily., Disp: 90 capsule, Rfl: 3    [START ON 12/19/2023] buprenorphine-naloxone (SUBOXONE) 8-2 mg sublingual film, Place 1 Film (8 mg of buprenorphine total) under the tongue every twelve (12) hours., Disp: 30 Film, Rfl: 0    naloxone (NARCAN) 4 mg nasal spray, One spray in either nostril once for known/suspected opioid overdose. May repeat every 2-3 minutes in  alternating nostril til EMS arrives, Disp: 2 each, Rfl: PRN    +++++++++++++++++++    OBJECTIVE  BP 134/62 (BP Site: L Arm, BP Position: Sitting, BP Cuff Size: Medium)  - Pulse 64  - Ht 167.6 cm (5' 6)  - Wt 54.3 kg (119 lb 9.6 oz)  - BMI 19.30 kg/m??      GENERAL: Patient appears stated age, in NAD  Tremor: no  EOM: Grossly intact.  Skin: normal, with normal hair distribution   NEUROLOGIC: grossly intact.   PSYCHIATRIC: mood and affect were appropriate    Pertinent labs:      Office Visit on 11/14/2023   Component Date Value Ref Range Status    Amphetamines Screen, Ur 11/14/2023 Negative  Negative Final    Barbiturates Screen, Ur 11/14/2023 Negative  Negative Final    Benzodiazepines Screen, Urine 11/14/2023 Negative  Negative Final    Cannabinoids Screen, Ur 11/14/2023 Negative  Negative Final    Methadone Screen, Urine 11/14/2023 Negative  Negative Final    Cocaine(Metab.)Screen, Urine 11/14/2023 Negative  Negative Final    Opiates Screen, Ur 11/14/2023 Negative  Negative Final    Oxycodone Screen, Ur 11/14/2023 Negative  Negative Final    Buprenorphine, Urine 11/14/2023 Positive (A)  Negative Final    6-Monoacetylmorphine 11/14/2023 <5  <5 ng/mL Final    Morphine 11/14/2023 <25  <25 ng/mL Final    Codeine 11/14/2023 <25  <25 ng/mL Final    Hydrocodone 11/14/2023 <25  <25 ng/mL Final    Hydromorphone 11/14/2023 <25  <25 ng/mL Final    Oxycodone 11/14/2023 <25  <25 ng/mL Final    Oxymorphone 11/14/2023 <25  <25 ng/mL Final    Buprenorphine 11/14/2023 293 (H)  <5 ng/mL Final    Norbuprenorphine 11/14/2023 1,980 (H)  <5 ng/mL Final    Fentanyl 11/14/2023 <0.5  <0.5 ng/mL Final    Norfentanyl 11/14/2023 <1.0  <1.0 ng/mL Final    Interpretation 11/14/2023 POSITIVE   Final       Pertinent Radiology:  2020

## 2023-12-07 ENCOUNTER — Inpatient Hospital Stay: Admit: 2023-12-07 | Discharge: 2023-12-08 | Payer: MEDICARE

## 2023-12-12 MED ORDER — ALENDRONATE 70 MG TABLET
ORAL_TABLET | ORAL | 3 refills | 91.00000 days | Status: CP
Start: 2023-12-12 — End: 2024-12-11

## 2023-12-13 DIAGNOSIS — G4709 Other insomnia: Principal | ICD-10-CM

## 2023-12-13 MED ORDER — MIRTAZAPINE 7.5 MG TABLET
ORAL_TABLET | 0 refills | 0.00000 days
Start: 2023-12-13 — End: ?

## 2023-12-14 MED ORDER — MIRTAZAPINE 7.5 MG TABLET
ORAL_TABLET | Freq: Every evening | ORAL | 2 refills | 90.00000 days | Status: CP
Start: 2023-12-14 — End: 2024-11-13

## 2023-12-19 MED ORDER — BUPRENORPHINE 8 MG-NALOXONE 2 MG SUBLINGUAL FILM
ORAL_FILM | Freq: Two times a day (BID) | SUBLINGUAL | 0 refills | 15.00 days | Status: CP
Start: 2023-12-19 — End: 2024-01-18

## 2023-12-20 ENCOUNTER — Ambulatory Visit: Admit: 2023-12-20 | Discharge: 2023-12-21 | Payer: MEDICARE

## 2023-12-20 MED ORDER — BUPRENORPHINE 8 MG-NALOXONE 2 MG SUBLINGUAL FILM
ORAL_FILM | Freq: Two times a day (BID) | SUBLINGUAL | 0 refills | 30.00000 days | Status: CP
Start: 2023-12-20 — End: 2024-01-19

## 2023-12-20 NOTE — Unmapped (Addendum)
 [facility]  Adult Audiology     HEARING AID FITTING     PATIENT: Harold Richards, Harold Richards  DOB: [birth year]  MRN: [id number]  DOS: 12/20/2023     Elizbeth Gum is seen today for a hearing aid fitting with new technology. Harold Richards has known sensorineural hearing loss in both ears. Patient was unaccompanied to today's appointment.     DEVICE INFORMATION      Bilateral Amplification:     Right Ear Left Ear   Manufacturer [employer]   Model Pure C&G 3 IX    Serial Number [id number]   HAF Date 12/20/2023   Warranty 12/21/2026   Receiver strength and size 56M 56M   Slimtube Length      Dome small VENTED small VENTED   Ear mold     Bluetooth N/A   Battery Rechargeable   Accessory(s)    Accessory Warranty    Charger Serial Number [id number]   Charger Warranty 12/21/2026   Loss and Damage Claim  available available     DEVICE ORIENTATION & PHYSICAL FIT      Otoscopy:  RIGHT Ear: clear external auditory canal  LEFT Ear: clear external auditory canal    Physical Fit: Hearing aid(s) fit snugly in patient's ear comfortably and appropriately.      Device orientation provided:  Basic hearing aid landmarks - Identification of battery door, microphones, indicator light, and program buttons.   Hearing aid insertion and removal - Family and/or patient practiced insertion and removal of hearing aid(s) multiple times during today's appointment. Patient was able to independently insert and remove the hearing aid(s) today.   Charger use - insertion and removal of hearing aids in charger, light indicators     PROGRAMMING      Electroacoustic Analysis: PASS (completed by doctoral resident and/or [facility] prior to fitting date)     Feedback management: Completed today    Verification: NAL-NL2  The hearing aids were verified to best match prescriptive targets on the AudioScan Verifit II using on ear measures and average RECDs for soft, average, loud and MPO levels.        The hearing aids were programmed with the following settings:  Start-up Program: Universal    Other Features:  Frequency Compression (transposition/lowering/shifting): Off  Verified: Yes  Telecoil: No    User Settings:    Toggle Function: volume control (short press) and on/off (long press)      BLUETOOTH CONNECTIVITY & SMARTPHONE APP     Patient would prefer to wait for follow-up to connect devices via bluetooth connectivity.      COUNSELING     The following concepts regarding hearing aid management/maintenance were discussed:  [x]  Wearing schedule - The goal for device use is full time (during all hours when awake and dry), but breaks can be taken during the initial acclimatization period. Device is water resistant, but not waterproof, and should be removed for all water activities including bath time.  [x]  Acclimatization process and importance of full time use of the device    Equipment:  [x]  Use of device- insertion and removal of device from ear, expected wear time, etc.  [x]  Battery use and options, insertion, and toxicity cautions   [x]  Device storage - Instructed that the device should be in the case/charger or dehumidifier if not being worn.  [x]  Care and maintenance - Demonstrated use of wax removal loop and brush. Practiced wax filter and/or dome replacements. Counseled on use of a soft, dry cloth  to clean the body of the device.  [x]  Troubleshooting strategies - First check the battery: fresh/new battery or fully charged battery. Second, check for wax blocking earmold or dome. If bluetooth issues, try turning the phone off/on and re-pairing. Contact customer support if bluetooth issues are unresolved (see below contact information).   [x]  Warranty information - Counseled on repair warranty and one-time loss and damage warranty. Counseled on out-of-warranty costs associated with audiologic services.  []  Notified of walk-in clinic hours ([place] location: Fridays at [place], [place]  [place] location: Wednesdays from 8-11am)    Accessories:  []  Use of the following accessories  []  Cell phone streaming and bluetooth functionality  []  Use of cell phone app for program/volume changes    Customer Support:    [employer]:  Dance movement psychotherapist hotline: [phone] (Monday-Friday, 8am-8pm)  Device Support: How to videos - handling [employer] hearing aids and accessories - Customer service manager and App Connectivity Support: [employer] App for Hearing Aids - Download for Android or iPhone    Harold Richards was encouraged to contact the Adult Hearing Aid Program at [phone] for device troubleshooting questions or supplies as needed.     RECOMMENDATIONS    Work up to full time use of amplification    Return to clinic in approximately 1 month for hearing aid follow-up      Charges associated with today's visit:  CPT V5261 - Hearing Aid Digital Binaural BTE; ELP  CPT V5011 - HC Hearing Aid Fitting, Orientation, Checking; ELP    Visit Time: 60 min    Ruthanne Covey, AUD  Clinical Audiologist  [facility] Adult Audiology Program  Scheduling: [phone]

## 2024-01-03 ENCOUNTER — Ambulatory Visit: Admit: 2024-01-03 | Discharge: 2024-01-04 | Payer: MEDICARE

## 2024-01-03 NOTE — Unmapped (Signed)
 UROLOGY CLINIC FOLLOW UP NOTE    Patient Name: Harold Richards  Patient Age: 68 y.o.  Encounter Date: 01/03/2024    Referring Provider:   Raliegh Burgess, MD  [address]  [address]  [place],  [place] [address]    PCP:   Raliegh Burgess, MD    Reason for Visit:   Chief Complaint   Patient presents with    Recurrent nephrolithiasis       Assessment  68 y.o. male with a history of prostate cancer (followed by radiation onc), here for follow-up of nephrolithiasis    Long-standing nephrolithiasis with multiple episodes over 6-7 years. Most recent episode in November 2024 with a 3 mm left UVJ stone and hydronephrosis, resolved by December 2024. Has 3 punctate left-sided stones per 2024 CT/RUS. Recommend routine imaging/ultrasound. We would consider more definitive imaging such as a CT scan if the patient develops concerning symptoms. Patient asymptomatic today.    We reviewed his 10/2023 litholink: low volume (1.04L), normal saturations, normal calcium, oxalate, and citrate, low pH (5.5) and elevated uric acid saturation (1.25), and high-normal sodium.    Discussed general stone prevention measures such as increasing fluid intake to at least 2-2.5 L/day, adding citrus or lemonade, limiting foods high in oxalates, moderate calcium intake (milk, yogurt), being mindful of animal protein portions, and decreasing sodium intake to <2,000 mg/day.      Plan:  - Recommend increased hydration to produce 2 liters of urine per day  - Maintain increased dietary citrate intake (limes, lemons, low-calorie crystal light)  - Reduce salt intake  - Repeat litholink this fall    Follow-up 1 year with RUS/repeat litholink prior      HPI:   Harold Richards is a 68 y.o. male with a history of prostate cancer with Gleason follicles, status post EBRT and brachytherapy with boost, here for follow-up of nephrolithiasis.    Recurrent nephrolithiasis over the last 6-7 years  Most recent episode in November 2024 with a 3 mm left UVJ stone and hydronephrosis, resolved by December 2024.   Completed a litholink 10/2023, notes he is often very dehydrated and eats a high-salt diet  No family history of kidney stones.   No chronic TUMS/calcium use    Denies fever, chills, chest pain, shortness of breath,nausea, vomiting, flank pain, dysuria, hematuria      Past Medical History:  Past Medical History:   Diagnosis Date    Actinic keratosis     Allergic     Allergic rhinitis 2018    Anemia 05/30/2023    on iron supplement but still has not resolved    Asthma (HHS-HCC)     Basal cell carcinoma     Chronic headaches     Colon polyp     Coronary artery disease 2018    Elevated PSA 2019    Enlarged prostate     GERD (gastroesophageal reflux disease) 02/08/2013    Hepatitis C     Hx of substance abuse (CMS-HCC)     hx of recreational drug use (cocaine IV, intranasal and crack),     Hyperlipidemia 2018    Infectious viral hepatitis     Lung nodule seen on imaging study 07/12/2016    Osteoporosis     Other emphysema       Prostate cancer   October 2019    Renal calculus     Skin cancer     Tinnitus 2018    Tobacco abuse 02/08/2013  Past Surgical History:  Past Surgical History:   Procedure Laterality Date    CERVICAL FUSION      cage    COLONOSCOPY  2014    PR COLONOSCOPY FLX DX W/COLLJ SPEC WHEN PFRMD N/A 09/01/2021    Procedure: COLONOSCOPY, FLEXIBLE, PROXIMAL TO SPLENIC FLEXURE; DIAGNOSTIC, W/WO COLLECTION SPECIMEN BY BRUSH OR WASH;  Surgeon: Murtis Arthur, MD;  Location: [facility] [place];  Service: Gastroenterology    PR COLSC FLX W/RMVL OF TUMOR POLYP LESION SNARE TQ N/A 10/20/2015    Procedure: COLONOSCOPY FLEX; W/REMOV TUMOR/LES BY SNARE;  Surgeon: Thedore Finn, MD;  [facility]: [facility];  Service: Gastroenterology    PR EXCISION SUBMAXILLARY GLAND Right 03/31/2015    Procedure: EXC SUBMANDIBULAR GLAND;  Surgeon: [place]ores Haines, MD;  Location: ASC OR [facility];  Service: ENT    PR REMV UPPER JAW-MAXILLECTOMY Left 07/22/2019    Procedure: MAXILLECTOMY; WO ORBITAL EXENTERATION;  Surgeon: Adam Swaziland Kimple, MD;  Location: ASC OR [facility];  Service: ENT    PR STEREOTACTIC COMP ASSIST PROC,CRANIAL,EXTRADURAL Left 07/22/2019    Procedure: STEREOTACTIC COMPUTER-ASSISTED (NAVIGATIONAL) PROCEDURE; CRANIAL, EXTRADURAL;  Surgeon: Adam Swaziland Kimple, MD;  Location: ASC OR [facility];  Service: ENT    PR UPPER GI ENDOSCOPY,DIAGNOSIS N/A 08/18/2017    Procedure: UGI ENDO, INCLUDE ESOPHAGUS, STOMACH, & DUODENUM &/OR JEJUNUM; DX W/WO COLLECTION SPECIMN, BY BRUSH OR WASH;  Surgeon: Marsh Skeans, MD;  [facility];  Service: Gastroenterology    PR UPPER GI ENDOSCOPY,DIAGNOSIS N/A 11/08/2019    Procedure: UGI ENDO, INCLUDE ESOPHAGUS, STOMACH, & DUODENUM &/OR JEJUNUM; DX W/WO COLLECTION SPECIMN, BY BRUSH OR WASH;  Surgeon: Gypsy Lesser, MD;  [facility]: [facility];  Service: Gastroenterology    PROSTATE SURGERY  2020    SKIN BIOPSY      SPINE SURGERY  2009    UPPER GASTROINTESTINAL ENDOSCOPY  2019    VASECTOMY  1988        Family History:  family history includes Bone cancer in his mother; COPD in his mother; Cancer in his maternal aunt and mother; Diabetes in his paternal aunt; Heart attack in his father; Heart disease in his father and maternal grandfather; Hepatitis in his sister; Hypertension in his father and mother; Liver disease in his brother and brother; Lung cancer in his mother; Melanoma in his maternal aunt.    Medications:  Current Outpatient Medications   Medication Sig Dispense Refill    albuterol HFA 90 mcg/actuation inhaler Inhale 2 puffs every four (4) hours as needed for wheezing. 8 g 3    alendronate (FOSAMAX) 70 MG tablet Take 1 tablet (70 mg total) by mouth every seven (7) days. 13 tablet 3    aspirin (ECOTRIN) 81 MG tablet Take 1 tablet (81 mg total) by mouth daily.      atorvastatin (LIPITOR) 20 MG tablet TAKE 1 TABLET(20 MG) BY MOUTH EVERY NIGHT 90 tablet 3 buprenorphine-naloxone (SUBOXONE) 8-2 mg sublingual film Place 1 Film (8 mg of buprenorphine total) under the tongue every twelve (12) hours. 60 Film 0    cholecalciferol, vitamin D3-25 mcg, 1,000 unit,, 25 mcg (1,000 unit) capsule Take 1 capsule (25 mcg total) by mouth daily.      lactulose 10 gram/15 mL solution Take 15 mL (10 g total) by mouth two (2) times a day. 900 mL 6    loratadine (CLARITIN) 10 mg tablet Take 1 tablet (10 mg total) by mouth once as needed.  mirtazapine (REMERON) 7.5 MG tablet Take 1 tablet (7.5 mg total) by mouth nightly. 90 tablet 2    naloxone (NARCAN) 4 mg nasal spray One spray in either nostril once for known/suspected opioid overdose. May repeat every 2-3 minutes in alternating nostril til EMS arrives 2 each PRN    ondansetron (ZOFRAN-ODT) 4 MG disintegrating tablet TAKE 1 TABLET BY MOUTH EVERY 12 HOURS AS NEEDED FOR NAUSEA      pantoprazole (PROTONIX) 40 MG tablet TAKE 1 TABLET(40 MG) BY MOUTH EVERY NIGHT 90 tablet 3    tamsulosin (FLOMAX) 0.4 mg capsule Take 1 capsule (0.4 mg total) by mouth daily. 90 capsule 3     No current facility-administered medications for this visit.       Allergies:  Allergies   Allergen Reactions    Bicalutamide Rash    Penicillins Rash    Rocephin [Ceftriaxone] Rash     Patient received in Emergency Room and reported rash to stomach and back several hours later.        Social History:  Lives in [place]. Tobacco use    ROS:   As per HPI.  The patient was asked to review all abnormal responses not pertinent to today's visit with their primary care provider.     Vitals:  Vitals:    01/03/24 0830   BP: 119/43   Pulse: 63       Physical Exam:  GENERAL: Pleasant, in no acute distress.   HEENT: Normocephalic and atraumatic.   NECK: Supple with trachea midline.   LYMPHATICS: No cervical or supraclavicular lymphadenopathy.   PULMONARY: Relaxed respiratory effort on room air.   CARDIOVASCULAR: Regular rate   GASTROINTESTINAL: nondistended  GENITOURINARY:   SKIN: No signs of cyanosis or clubbing.   NEUROLOGICAL: Grossly intact.   PSYCH: Alert and oriented x 3.    Labs Reviewed:  Lab Results   Component Value Date    WBC 6.7 10/12/2023    HGB 13.2 10/12/2023    HCT 37.2 (L) 10/12/2023    PLT 208 10/12/2023       Lab Results   Component Value Date    NA 142 10/12/2023    K 3.7 10/12/2023    CL 108 (H) 10/12/2023    CO2 24.2 10/12/2023    BUN 14 10/12/2023    CREATININE 0.89 10/12/2023    CALCIUM 9.1 10/12/2023    MG 2.0 05/23/2022     Lab Results   Component Value Date/Time    PSA <0.04 06/29/2023 08:56 AM    PSA <0.04 12/27/2022 09:29 AM    PSA <0.04 06/28/2022 01:34 PM    PSA <0.04 12/23/2021 09:48 AM    PSA 0.06 11/05/2021 09:11 AM    PSA <0.04 06/23/2021 01:27 PM    PSA <0.04 03/23/2021 11:56 AM    PSA <0.04 12/15/2020 08:50 AM    PSA <0.04 09/15/2020 11:08 AM    PSA <0.04 05/28/2020 02:11 PM    PSA <0.04 02/27/2020 08:34 AM    PSA <0.10 08/28/2019 09:25 AM    PSA <0.10 01/16/2019 09:07 AM    PSA 4.59 (H) 05/18/2018 02:45 PM    PSA 3.88 10/26/2017 07:58 AM    PSA 4.15 (H) 07/27/2017 08:57 AM    PSASCRN 2.2 02/28/2012 01:52 PM      10/2023 Litholink  10/23/23 0500 03/14/23 1055 11/06/20 1042 07/22/19 1518    Cystine, Ur Neg      URINE VOLUME 1,040      CALCIUM OXALATE SATURATION  7.35      Calcium, Urine 125      Oxalate, Ur 36      Citrate, Ur 933      CALCIUM PHOSPHATE SATURATION 0.73      pH, 24 Hr Urine 5.598 Low       URIC ACID SATURATION 1.25 High       Uric Acid, Urine 336      Sodium, Urine 146      Potassium, Urine 112 High       Mag, Urine 130 High       Phosphorus, Urine 1,064      Ammonium Urine 24      Chloride, Urine 186      Sulfate Urine 16 Low       UREA NITROGEN, URINE 5.72 Low       PROTEIN CATABOLIC RATE 0.9      Creatinine, Urine 1,109      CREATININE/KG BODY WEIGHT 22.2      CALCIUM/KG BODY WEIGHT 2.5      CALCIUM/CREATININE RATIO 112        Stone analysis: calcium oxalate    Imaging Reviewed:  07/25/23 RUS  Impression   --No evidence of hydronephrosis. Previously identified left UVJ stone has likely passed.                 Narrative   EXAM: US  RENAL COMPLETE   ACCESSION: [id number]   REPORT DATE: 07/25/2023 4:12 PM      CLINICAL INDICATION: 68 years old with follow - up for left hydroureteronephrosis due to obstructing stone  - N20.0 - Recurrent nephrolithiasis - N13.30 - Hydroureteronephrosis        COMPARISON: CT abdomen and pelvis 07/08/2023.      TECHNIQUE: Static and cine images of the kidneys and bladder were performed.      FINDINGS:      KIDNEYS: Normal size and echogenicity. A few tiny punctate nonobstructing stones or artifact in both kidneys measuring up to 0.3 cm. [No right-sided stone per CT]. No hydronephrosis. A few tiny cysts measuring up to 0.6 cm in both kidneys..        Right kidney: 10.8 cm        Left kidney: 10.4 cm      BLADDER: Mildly distended limiting assessment.        Bladder volume prevoid: 64.31 mL     07/08/23 CT  Impression   Obstructing stone at the left ureterovesicular junction, resulting in mild to moderate left hydroureteronephrosis.      Additional nonobstructing left-sided nephrolithiasis.

## 2024-01-03 NOTE — Unmapped (Signed)
 [facility]: Kidney Stone Prevention   Kidney stones can form due to one or all of the following:     1. Low fluid Intake   2. Low citrate in the urine   3. Too much salt, oxalate, and animal protein     The following dietary changes may decrease your risk of forming stones.     1. Increase your fluid intake to around 2.5 - 3 liters per day. Diluting the urine hinders the formation of stones. You should drink enough fluid to make 2 liters of urine a day. You know you are drinking enough fluid if your urine is clear.     2. Increase the amount of citrate in your diet. Most fruits and vegetables are high in citrate. Increasing the amount of fruit and vegetable your diet will raise your citrate. Ideally you should have 5  servings per day. Lemons and limes have the highest amount of citrate. A recipe for lemonade may be helpful to increase citrate in your diet: 1/2 cup concentrated lemon juice to 2 quarts of water. Sweeten to taste.    3. Decrease your salt intake to less than 2300mg /day   This will decrease the amount of calcium excreted in your urine. Eat more fresh or frozen vegetables instead of canned. Eat less processed meats. At restaurants request your food to be prepared without salt or high sodium seasoning.     Added salt in cooking Added salt at the table Canned/bottled tomato sauce   Salty, cured meats,: deli meat: sausage Salty snack (chips, pretzels, crackers, salted nuts/seeds) Cheeses (esp. processed), including cottage cheese   Canned soups/vegetables Some salad dressings Miso   Convenience foods Pickles/olives Soy sauce, teriyaki sauce   Fast foods/restaurant foods Sports beverages Frozen entrees   Breads, bagels, rolls, and baked goods Casseroles & other foods (pizza, lasagna) with cheese Some breakfast cereals (read labels)       4. Limit the amount of Oxalate containing foods in your diet to 50mg  per day.   This will decrease the amount of oxalate excreted in your urine. Consume calcium with these foods.    Spinach   Potato  Nuts   Peanut Butter   Chocolate   Tea    Lower oxalate options:  Zucchini, kale, bok choy, mustard greens, lettuce, broccoli, brussel sprouts, red, yellow, orange peppers, corn, green beans, cabbage  Cauliflower, white rice, parsnips, macaroni & cheese, egg noodles  Cooked carrots, squash, peaches  Popcorn, macadamia nuts, pistachios    5. Limit the amount of animal proteins in your diet to approximately the size of 1 deck of cards per day  This will decrease the amount of uric acid excreted in your urine.     Fish   Liver   Chicken   Red Meat - no more than 3 servings per week       If you would like more information on kidney stone prevention or some recipe ideas for healthy meals in stone prevention, please go to:  [email or web address].pdf          If you believe you are passing a stone, go to the emergency department immediately for:  - worsening or severe pain (not controlled by pain medications)  - fevers or shaking chills  - inability to urinate or urine that looks like ketchup  - persistent nausea and/or vomiting

## 2024-01-12 ENCOUNTER — Encounter: Admit: 2024-01-12 | Discharge: 2024-01-13 | Payer: MEDICARE

## 2024-01-12 DIAGNOSIS — H9193 Unspecified hearing loss, bilateral: Principal | ICD-10-CM

## 2024-01-12 DIAGNOSIS — K7469 Other cirrhosis of liver: Principal | ICD-10-CM

## 2024-01-12 DIAGNOSIS — N200 Calculus of kidney: Principal | ICD-10-CM

## 2024-01-12 DIAGNOSIS — F1111 Opioid abuse, in remission: Principal | ICD-10-CM

## 2024-01-12 DIAGNOSIS — M810 Age-related osteoporosis without current pathological fracture: Principal | ICD-10-CM

## 2024-01-12 NOTE — Unmapped (Addendum)
 Last symptomatic recurrence complicated by infection and hydronephrosis in 2025.  Hydronephrosis now resolved on imaging  Has since seen urology.   Continue Flomax 0.4 mg daily  Follow-up with urology as schedule  Advised to Increase water intake to produce at least two liters of urine per day.  - Monitor for signs of fluid retention and report immediately. (Given his cirrhosis, current compensated)  - Incorporate more citrate in the diet by adding lemon or lime to water.

## 2024-01-12 NOTE — Unmapped (Signed)
 On Fosamax for osteoporosis management. Previously on Reclast IV due to decreased bone density. Calcium and kidney function labs are normal.  - Continue Fosamax once a week.  - Monitor bone density and labs as per endocrinologist's recommendations.

## 2024-01-12 NOTE — Unmapped (Signed)
 Compensated. Previously c/b encephalopathy.   Follow-up with MRI abdomen and hepatology as scheduled

## 2024-01-12 NOTE — Unmapped (Signed)
 Chief concern/reason for visit: MOUD, follow-up chronic conditions    Assessment & Plan  Opioid use disorder, mild, in sustained remission  Patient IMCMATSTABILITY: is stable on maintenance therapy imcmatmanagementplan1: 2 month follow up, next visit in person    PRESCRIPTIONS GIVEN TODAY:    Medications ordered during this encounter   Medications    buprenorphine-naloxone (SUBOXONE) 8-2 mg sublingual film     Sig: Place 1 Film (8 mg of buprenorphine total) under the tongue every twelve (12) hours.     Dispense:  60 Film     Refill:  0    buprenorphine-naloxone (SUBOXONE) 8-2 mg sublingual film     Sig: Place 1 Film (8 mg of buprenorphine total) under the tongue two (2) times a day.     Dispense:  60 Film     Refill:  0    - has Suboxone prescriptions through 03/20/2024  - Benefits of treatment outweigh the risks  - Utox today: not obtained today (virtual visit)  - PDMP reviewed: Appropriate on review today  - Next appt: Return in 2 months (on 03/12/2024) for In-person 8:20am on 8/5 (confirmed with patient in appt).  - Substance Abuse Counseling:  not currently enrolled  - Does pt have naloxone at home and know how to use it?  yes  - Labs needed today? none    No results found for: HIV12AB, HIVAGAB  Lab Results   Component Value Date    HEPAIGG Nonreactive 07/22/2015    HEPBCAB Reactive (A) 07/22/2015    HEPCAB Positive (A) 03/05/2014     Lab Results   Component Value Date    ALKPHOS 47 10/12/2023    BILITOT <0.2 (L) 10/12/2023    BILIDIR <0.10 12/23/2020    PROT 6.8 10/12/2023    ALBUMIN 3.7 10/12/2023    ALT 13 10/12/2023    AST 26 10/12/2023     No results found for: PREGTESTUR, PREGSERUM, HCG, HCGQUANT    - Suboxone treatment agreement:  has been signed by patient and scanned into record        Orders:    buprenorphine-naloxone (SUBOXONE) 8-2 mg sublingual film; Place 1 Film (8 mg of buprenorphine total) under the tongue every twelve (12) hours.    buprenorphine-naloxone (SUBOXONE) 8-2 mg sublingual film; Place 1 Film (8 mg of buprenorphine total) under the tongue two (2) times a day.        Recurrent nephrolithiasis  Last symptomatic recurrence complicated by infection and hydronephrosis in 2025.  Hydronephrosis now resolved on imaging  Has since seen urology.   Continue Flomax 0.4 mg daily  Follow-up with urology as schedule  Advised to Increase water intake to produce at least two liters of urine per day.  - Monitor for signs of fluid retention and report immediately. (Given his cirrhosis, current compensated)  - Incorporate more citrate in the diet by adding lemon or lime to water.           Cirrhosis (CMS-HCC)  Compensated. Previously c/b encephalopathy.   Follow-up with MRI abdomen and hepatology as scheduled           Diminished hearing, bilateral  Now has bilateral hearing aids  Follow-up with audiology as scheduled       Osteoporosis of lumbar spine  On Fosamax for osteoporosis management. Previously on Reclast IV due to decreased bone density. Calcium and kidney function labs are normal.  - Continue Fosamax once a week.  - Monitor bone density and labs as per endocrinologist's recommendations.  Return in 2 months (on 03/12/2024) for In-person 8:20am on 8/5 (confirmed with patient in appt).        Subjective      HPI:  History of Present Illness  Harold Richards is a 68 year old male who presents for a follow-up visit.    He is managing kidney stones by increasing water intake to produce at least two liters of urine daily, reducing salt intake, and incorporating more citrate into his diet. Cirrhosis is well controlled, and he monitors for signs of fluid retention, such as swelling in the feet or ankles, due to increased fluid intake.    He takes Fosamax weekly for osteoporosis after a recent DEXA scan showed decreased bone density. He previously received Reclast IV annually and had a drug holiday before resuming treatment. He has no issues swallowing the medication.    He is on Suboxone, with the last prescription filled on May 14, and is due for refills in June and July. His pharmacy is open daily for prescription pick-up.        Objective      BP 126/72  - Pulse 66  - Ht 167.6 cm (5' 6)  - Wt 53.1 kg (117 lb)  - BMI 18.88 kg/m??      Physical Exam:  As part of this Video Visit, no in-person exam was conducted.      The patient reports they are physically located in [place]  and is currently: at home. I conducted a audio/video visit. I spent  52m 18s on the video call with the patient. I spent an additional 6 minutes on pre- and post-visit activities on the date of service .

## 2024-01-12 NOTE — Unmapped (Addendum)
 Patient IMCMATSTABILITY: is stable on maintenance therapy imcmatmanagementplan1: 2 month follow up, next visit in person    PRESCRIPTIONS GIVEN TODAY:    Medications ordered during this encounter   Medications    buprenorphine-naloxone (SUBOXONE) 8-2 mg sublingual film     Sig: Place 1 Film (8 mg of buprenorphine total) under the tongue every twelve (12) hours.     Dispense:  60 Film     Refill:  0    buprenorphine-naloxone (SUBOXONE) 8-2 mg sublingual film     Sig: Place 1 Film (8 mg of buprenorphine total) under the tongue two (2) times a day.     Dispense:  60 Film     Refill:  0    - has Suboxone prescriptions through 03/20/2024  - Benefits of treatment outweigh the risks  - Utox today: not obtained today (virtual visit)  - PDMP reviewed: Appropriate on review today  - Next appt: Return in 2 months (on 03/12/2024) for In-person 8:20am on 8/5 (confirmed with patient in appt).  - Substance Abuse Counseling:  not currently enrolled  - Does pt have naloxone at home and know how to use it?  yes  - Labs needed today? none    No results found for: HIV12AB, HIVAGAB  Lab Results   Component Value Date    HEPAIGG Nonreactive 07/22/2015    HEPBCAB Reactive (A) 07/22/2015    HEPCAB Positive (A) 03/05/2014     Lab Results   Component Value Date    ALKPHOS 47 10/12/2023    BILITOT <0.2 (L) 10/12/2023    BILIDIR <0.10 12/23/2020    PROT 6.8 10/12/2023    ALBUMIN 3.7 10/12/2023    ALT 13 10/12/2023    AST 26 10/12/2023     No results found for: PREGTESTUR, PREGSERUM, HCG, HCGQUANT    - Suboxone treatment agreement:  has been signed by patient and scanned into record        Orders:    buprenorphine-naloxone (SUBOXONE) 8-2 mg sublingual film; Place 1 Film (8 mg of buprenorphine total) under the tongue every twelve (12) hours.    buprenorphine-naloxone (SUBOXONE) 8-2 mg sublingual film; Place 1 Film (8 mg of buprenorphine total) under the tongue two (2) times a day.

## 2024-01-19 MED ORDER — BUPRENORPHINE 8 MG-NALOXONE 2 MG SUBLINGUAL FILM
ORAL_FILM | Freq: Two times a day (BID) | SUBLINGUAL | 0 refills | 30.00000 days | Status: CP
Start: 2024-01-19 — End: 2024-02-18

## 2024-01-29 NOTE — Unmapped (Unsigned)
**Note Harold-Identified via Obfuscation**  [facility]  Adult Audiology     [facility] Adult Audiology Hearing Aid Follow-up Appointment      PATIENT: Harold Richards, Harold Richards  DOB: [birth year]  MRN: [id number]  DOS: 01/30/2024    HISTORY      Harold Richards is a 68 y.o. individual with a known bilateral hearing loss seen today for hearing aid follow-up. Patient was unaccompanied to today's appointment.  Today, patient reports loving his hearing aids. He says it is the best thing he has ever done for himself. No issues with aids or charger. He would like to pair to iPhone today as he enjoys listening to audio books.    DEVICE INFORMATION            Right Ear Left Ear   Manufacturer Signia   Model Pure C&G 3 IX    Serial Number [id number]   HAF Date 12/20/2023   Warranty 12/21/2026   Receiver strength and size 84M 84M           Dome small VENTED small VENTED          Bluetooth N/A   Battery Rechargeable   Accessory(s)     Accessory Warranty     Charger Serial Number [id number]   Charger Warranty 12/21/2026   Loss and Damage Claim  available available         HEARING AID FOLLOW UP      Otoscopy  RIGHT Ear: clear external auditory canal  LEFT Ear: clear external auditory canal    A listening check was completed and the hearing aids were found to be in good working order, without evidence of static, weakness, or distortion. Hearing aid(s) cleaned and small parts replaced in the clinic today.    Physical Fit:  Hearing aid(s) continue to fit snugly in patient's ear comfortably and appropriately.     Patient does not utilize retention locks at this time.    Programming:  Datalogging: 13 hours/day on average    Feedback management: Completed at previous visit    Verification: NAL-NL2  The hearing aids were previously verified to best match targets on 12/20/2023 via real-ear measures.     Other programming changes: firmware updated, added programs     The hearing aids were programmed with the following settings:  Start-up Program:   P1: Universal  P2: Noisy Environment  P3: TV  P4: Music  P5: Outdoor sport    User Settings:  Toggle Function: left=volume, right= program change  (new)  Long press bottom button = off/on    BLUETOOTH CONNECTIVITY & SMART PHONE APP     Hearing aid(s) were paired with smartphone Bluetooth. Phone call practiced.    Unable to install Signia app due to lack of apple id password.    COUNSELING     The following concepts regarding hearing aid management/maintenance were discussed:  []  Wearing schedule - The goal for device use is full time (during all hours when awake and dry), but breaks can be taken during the initial acclimatization period. Device is water  resistant, but not waterproof, and should be removed for all water  activities including bath time.  [x]  Acclimatization process and importance of full time use of the device    Equipment:  []  Use of device- insertion and removal of device from ear, expected wear time, etc.  []  Battery safety and toxicity cautions - Proper device battery storage and disposal. Batteries are toxic and should be kept out of reach of children and pets.  []  Battery  use and options, insertion, and toxicity cautions   []  Device storage - Instructed that the device should be in the case/charger or dehumidifier if not being worn.  [x]  Care and maintenance - Demonstrated use of wax removal loop and brush. Practiced wax filter and/or dome replacements. Counseled on use of a soft, dry cloth to clean the body of the device.  [x]  Troubleshooting strategies - First check the battery: fresh/new battery or fully charged battery. Second, check for wax blocking earmold or dome. If bluetooth issues, try turning the phone off/on and re-pairing. Contact customer support if bluetooth issues are unresolved (see below contact information).   []  Warranty information - Counseled on repair warranty and one-time loss and damage warranty. Counseled on out-of-warranty costs associated with audiologic services.  [x]  Notified of walk-in clinic hours ([place] location: Fridays and Mondays at 1pm, [place]  [place] location: Wednesdays from 8-11am)    Accessories:  []  Use of the following accessories:   [x]  Cell phone streaming and bluetooth functionality  []  Use of cell phone app for program/volume changes    Customer Support:    SIGNIA:  Dance movement psychotherapist hotline: [phone] (Monday-Friday, 8am-8pm)  Device Support:   Support for Signia hearing aids - Signia   How to videos - handling Signia hearing aids and accessories - Market researcher Support: Signia App for Hearing Aids - Download for Android or iPhone         Harold Richards was encouraged to contact the Adult Hearing Aid Program at [phone] for device troubleshooting questions or supplies as needed.      Patient was counseled on today's results via verbal communication and expressed understanding.  No barriers to education were identified.    RECOMMENDATIONS     ?? Full time use of amplification.   ?? Contact clinic if hearing aid(s) performance changes.  ?? Return to clinic in approximately 6 months for hearing evaluation and hearing aid follow-up.    Install app at home        Charges associated with today's visit:  HC No Charge; Qty: 2    Visit Time: 30 min    Jon CHRISTELLA Lush, AuD  Clinical Audiologist  [facility] Adult Audiology Program  Scheduling: [phone] Proper device battery storage and disposal. Batteries are toxic and should be kept out of reach of children and pets.  []  Battery use and options, insertion, and toxicity cautions   []  Device storage - Instructed that the device should be in the case/charger or dehumidifier if not being worn.  []  Care and maintenance - Demonstrated use of wax removal loop and brush. Practiced wax filter and/or dome replacements. Counseled on use of a soft, dry cloth to clean the body of the device.  []  Troubleshooting strategies - First check the battery: fresh/new battery or fully charged battery. Second, check for wax blocking earmold or dome. If bluetooth issues, try turning the phone off/on and re-pairing. Contact customer support if bluetooth issues are unresolved (see below contact information).   []  Warranty information - Counseled on repair warranty and one-time loss and damage warranty. Counseled on out-of-warranty costs associated with audiologic services.  []  Notified of walk-in clinic hours ([place] location: Fridays at [place], [place]  [place] location: Wednesdays from 8-11am)    Accessories:  []  Use of the following accessories: {ADULTAUDHAACCESSORY:113066}  []  Cell phone streaming and bluetooth functionality  []  Use of cell phone app for program/volume changes    Customer Support:  OTICON:  Ph: [phone]  Device Support: Get help using and taking care of your hearing aids - Oticon  Bluetooth and App Connectivity Support: Oticon Companion app   Accessory setup: How to connect Oticon hearing aids to wireless accessories    PHONAK:  Bluetooth consumer hotline: [phone] (Mon-Fri 9am-5:30pm)  Device support: Support - Phonak  Mobile Device setup: Set Up Your Phonak Hearing Aids and Devices - Phonak  App Support: How to repair connection issues with the myPhonak app on iPhone??   How-to-Videos: Phonak Apps     RESOUND:  Ph: [phone] (Mon-Fri 8am-8pm)  Email: consumerhelp@gnresound .[email or web address]  Device Support: Help & support - hearing aids, apps & accessories - ReSound [employer]   App setup: Help & support for the Smart 3D app - ReSound - ReSound [place]     SIGNIA:  Dance movement psychotherapist hotline: [phone] (Monday-Friday, 8am-8pm)  Device Support:   Support for Signia hearing aids - Signia   How to videos - handling Signia hearing aids and accessories - Market researcher Support: Signia App for Hearing Aids - Download for Android or iPhone    STARKEY:  Ph: [phone]  Device Support: Fish farm manager Support - Financial controller Support: Engineer, civil (consulting) & Pairing - Starkey Support    WIDEX:  Ph: [phone] (Monday - Friday from 9am-5pm)  Device Support: Service and Support - Product/process development scientist Support:  https://www.widex.com/en-us /support/support-for-apps/     Harold Richards was encouraged to contact the Adult Hearing Aid Program at [phone] for device troubleshooting questions or supplies as needed.      Patient was counseled on today's results via {ADULTAUDPATIENTEDU:82630} and expressed understanding.  No barriers to education were identified.    RECOMMENDATIONS     {Hearing Aid Recommendations:106398}  {Hearing Aid Recommendations:106398}  {Hearing Aid Recommendations:106398}  {Hearing Aid Recommendations:106398}      ***, {Bachelor's Izhmzz:31014}  Audiology Graduate Student Clinician    I was physically present and immediately available to direct and supervise tasks that were related to patient management. The direction and supervision was continuous throughout the time these tasks were performed.    Charges associated with today's visit:  {UNCADULTCODINGHAFITTING:74136}  {UNCADULTCODINGACCESSORY:74180}  {UNCADULTCODINGHARETURN:74146}  {UNCADULTCODINGHACONSULT:74145}  HC No Charge; Qty: ***    Visit Time: {unccivisittime:73518}    Jon CHRISTELLA Lush, AuD  Clinical Audiologist  [facility] Adult Audiology Program  Scheduling: [phone]

## 2024-01-30 ENCOUNTER — Ambulatory Visit: Admit: 2024-01-30 | Discharge: 2024-01-31 | Payer: MEDICARE | Attending: Audiologist | Primary: Audiologist

## 2024-02-18 MED ORDER — BUPRENORPHINE 8 MG-NALOXONE 2 MG SUBLINGUAL FILM
ORAL_FILM | Freq: Two times a day (BID) | SUBLINGUAL | 0 refills | 30.00000 days | Status: CP
Start: 2024-02-18 — End: 2024-03-19

## 2024-03-05 ENCOUNTER — Encounter: Admit: 2024-03-05 | Discharge: 2024-03-05 | Payer: MEDICARE

## 2024-03-05 DIAGNOSIS — F1111 Opioid abuse, in remission: Principal | ICD-10-CM

## 2024-03-05 DIAGNOSIS — K7469 Other cirrhosis of liver: Principal | ICD-10-CM

## 2024-03-05 DIAGNOSIS — Z681 Body mass index (BMI) 19 or less, adult: Principal | ICD-10-CM

## 2024-03-05 DIAGNOSIS — N200 Calculus of kidney: Principal | ICD-10-CM

## 2024-03-05 DIAGNOSIS — R636 Underweight: Principal | ICD-10-CM

## 2024-03-05 LAB — TOXICOLOGY SCREEN, URINE
AMPHETAMINE SCREEN URINE: NEGATIVE
BARBITURATE SCREEN URINE: NEGATIVE
BENZODIAZEPINE SCREEN, URINE: NEGATIVE
BUPRENORPHINE, URINE SCREEN: POSITIVE — AB
CANNABINOID SCREEN URINE: NEGATIVE
COCAINE(METAB.)SCREEN, URINE: NEGATIVE
METHADONE SCREEN, URINE: NEGATIVE
OPIATE SCREEN URINE: NEGATIVE
OXYCODONE SCREEN URINE: NEGATIVE

## 2024-03-05 NOTE — Unmapped (Signed)
 [facility]     Reason for visit: Follow up    Questions / Concerns that need to be addressed:       Omron BPs (complete if screening BP has a systolic  > 130 or diastolic > 80)  BP#1 141/64   BP#2 126/57  BP#3 120/59    Average BP 129/59  (please note this as a comment in vitals)     PTHomeBP       Dexcom or Libre CGM in use? If so, pull appropriate reporting through portal (Dexcom) or EPIC order [name]).    HCDM reviewed and updated in Epic:    We are working to make sure all of our patients??? wishes are updated in Epic and part of that is documenting a Environmental health practitioner for each patient  A Health Care Decision Maker is someone you choose who can make health care decisions for you if you are not able - who would you most want to do this for you????  is already up to date.    HCDM (patient stated preference): [name], [name] - Spouse - [phone]    HCDM, First Alternate: [name] - Daughter - [phone]    HCDM, Second AlternateBETHA Rudean Norris - Daughter - [phone]    BPAs completed:      Annual Screenings:     __________________________________________________________________________________________    SCREENINGS COMPLETED IN FLOWSHEETS      AUDIT       PHQ2       PHQ9          GAD7       COPD Assessment       Falls Risk

## 2024-03-05 NOTE — Unmapped (Addendum)
 Patient Harold Richards: is stable on maintenance therapy imcmatmanagementplan1: 2 month follow up, next visit virtual    PRESCRIPTIONS GIVEN TODAY:    Medications ordered during this encounter   Medications    buprenorphine -naloxone  (SUBOXONE ) 8-2 mg sublingual film     Sig: Place 1 Film (8 mg of buprenorphine  total) under the tongue two (2) times a day.     Dispense:  60 Film     Refill:  0    buprenorphine -naloxone  (SUBOXONE ) 8-2 mg sublingual film     Sig: Place 1 Film (8 mg of buprenorphine  total) under the tongue every twelve (12) hours.     Dispense:  60 Film     Refill:  0    - has Suboxone  prescriptions through 03/20/2024  - Benefits of treatment outweigh the risks  - Utox today: not obtained today (virtual visit)  - PDMP reviewed: Appropriate on review today  - Next appt: Return in about 10 weeks (around 05/14/2024) for Video Visit (needs to be scheduled prior to 05/18/24).  - Substance Abuse Counseling:  not currently enrolled  - Does pt have naloxone  at home and know how to use it?  yes  - Labs needed today? none    No results found for: HIV12AB, HIVAGAB  Lab Results   Component Value Date    HEPAIGG Nonreactive 07/22/2015    HEPBCAB Reactive (A) 07/22/2015    HEPCAB Positive (A) 03/05/2014     Lab Results   Component Value Date    ALKPHOS 47 10/12/2023    BILITOT <0.2 (L) 10/12/2023    BILIDIR <0.10 12/23/2020    PROT 6.8 10/12/2023    ALBUMIN 3.7 10/12/2023    ALT 13 10/12/2023    AST 26 10/12/2023     No results found for: PREGTESTUR, PREGSERUM, HCG, HCGQUANT    - Suboxone  treatment agreement:  has been signed by patient and scanned into record        Orders:    Toxicology Screen, Urine    buprenorphine -naloxone  (SUBOXONE ) 8-2 mg sublingual film; Place 1 Film (8 mg of buprenorphine  total) under the tongue two (2) times a day.    buprenorphine -naloxone  (SUBOXONE ) 8-2 mg sublingual film; Place 1 Film (8 mg of buprenorphine  total) under the tongue every twelve (12) hours.

## 2024-03-05 NOTE — Unmapped (Signed)
 If constipated for > 1 day, then take an extra dose of lactulose .   See if the days you are more confused or forgetful if those are days that you are also constipated.     Check your weight once a week and write these down. Tell me these at your next appt with me, which will be video

## 2024-03-05 NOTE — Unmapped (Signed)
 [facility] - Faculty Practice  Internal Medicine Clinic Visit    Reason for visit: follow-up MOUD treatment, cirrhosis    A/P:    Assessment & Plan  Opioid use disorder, mild, in sustained remission (CMS-HCC)  Patient IMCMATSTABILITY: is stable on maintenance therapy [email or web address]: 2 month follow up, next visit virtual    PRESCRIPTIONS GIVEN TODAY:    Medications ordered during this encounter   Medications    buprenorphine -naloxone  (SUBOXONE ) 8-2 mg sublingual film     Sig: Place 1 Film (8 mg of buprenorphine  total) under the tongue two (2) times a day.     Dispense:  60 Film     Refill:  0    buprenorphine -naloxone  (SUBOXONE ) 8-2 mg sublingual film     Sig: Place 1 Film (8 mg of buprenorphine  total) under the tongue every twelve (12) hours.     Dispense:  60 Film     Refill:  0    - has Suboxone  prescriptions through 03/20/2024  - Benefits of treatment outweigh the risks  - Utox today: not obtained today (virtual visit)  - PDMP reviewed: Appropriate on review today  - Next appt: Return in about 10 weeks (around 05/14/2024) for Video Visit (needs to be scheduled prior to 05/18/24).  - Substance Abuse Counseling:  not currently enrolled  - Does pt have naloxone  at home and know how to use it?  yes  - Labs needed today? none    No results found for: HIV12AB, HIVAGAB  Lab Results   Component Value Date    HEPAIGG Nonreactive 07/22/2015    HEPBCAB Reactive (A) 07/22/2015    HEPCAB Positive (A) 03/05/2014     Lab Results   Component Value Date    ALKPHOS 47 10/12/2023    BILITOT <0.2 (L) 10/12/2023    BILIDIR <0.10 12/23/2020    PROT 6.8 10/12/2023    ALBUMIN 3.7 10/12/2023    ALT 13 10/12/2023    AST 26 10/12/2023     No results found for: PREGTESTUR, PREGSERUM, HCG, HCGQUANT    - Suboxone  treatment agreement:  has been signed by patient and scanned into record        Orders:    Toxicology Screen, Urine    buprenorphine -naloxone  (SUBOXONE ) 8-2 mg sublingual film; Place 1 Film (8 mg of buprenorphine  total) under the tongue two (2) times a day.    buprenorphine -naloxone  (SUBOXONE ) 8-2 mg sublingual film; Place 1 Film (8 mg of buprenorphine  total) under the tongue every twelve (12) hours.          Recurrent nephrolithiasis  Encouraged ongoing hydration after last litholink results  Last symptomatic episode complicated by infection (06/2023)  Follow-up with Urology as scheduled.        Underweight (BMI < 18.5)  Underweight with recent weight of 114 lbs. Weight fluctuations noted. Increased physical activity reported.  - Monitor weight weekly and record measurements.  - Schedule a follow-up video visit in approximately two months to review weight and overall health.       Cirrhosis (CMS-HCC)  Chronic liver disease with prior history of hepatic encephalopathy. Forgetfulness potentially related to ammonia levels. Constipation may contribute to increased confusion due to ammonia buildup.  - Monitor bowel movements and adjust lactulose  dosage if constipation persists for more than one day.  - Instructed to take an additional dose of lactulose  if no bowel movement by noon the following day after constipation.  - will ask wife Asked wife to monitor for increased confusion  and correlate with bowel movement patterns.  - follow-up with GI as scheduled for MRI abdomen and clinic follow-up             HCM:   AAA screening:  prior MRI abodmen showing abdominal aorta within normal limits.  No need for additional AAA screening  COVID and flu vaccines:  discussed due around Sept/Oct 2025 when available  __________________________________________________________    HPI:    History of Present Illness  Harold Richards is a 68 year old male with liver disease who presents for a follow-up visit.    Hydration status and urinary changes  - Consumes four 16-ounce bottles of water  daily, increased from previously drinking only Pepsi  - Urine is clearer but not completely clear or pale yellow    Body weight changes  - Current weight is 114 pounds, with a slight decrease noted  - Historical weight ranged from 150 to 160 pounds prior to liver disease diagnosis  - Attributes recent weight changes to increased physical activity    Bowel habits and gastrointestinal symptoms  - Takes lactulose  daily  - Experiences constipation despite lactulose  use  - Has four bowel movements per day but occasionally none, which he considers constipation  - Bowel movements are generally soft  - No abdominal pain or diarrhea  - No significant changes in bowel habits aside from occasional constipation    Cognitive symptoms  - Experiences forgetfulness, attributing it to liver disease and ammonia levels     __________________________________________________________        Medications:  Reviewed in EPIC  __________________________________________________________    Physical Exam:   Vital Signs:  Vitals:    03/05/24 0834   BP: 129/59   BP Position: Sitting   Pulse: 62   Resp: 16   Temp: (P) 36.3 ??C (97.4 ??F)   TempSrc: (P) Temporal   SpO2: (P) 97%   Weight: 51.7 kg (114 lb)   Height: 167.6 cm (5' 6)          PTHomeBP  The patient???s Average Home Blood Pressure during the last two weeks is :   /   based on  readings      Gen: Well appearing, but thin, NAD  CV: RRR, no murmurs  Pulm: CTA bilaterally, no crackles or wheezes  Ext: No edema      PHQ-9 Score:     GAD-7 Score:       Medication adherence and barriers to the treatment plan have been addressed. Opportunities to optimize healthy behaviors have been discussed. Patient / caregiver voiced understanding.      Results

## 2024-03-06 NOTE — Unmapped (Signed)
 Encouraged ongoing hydration after last litholink results  Last symptomatic episode complicated by infection (06/2023)  Follow-up with Urology as scheduled.

## 2024-03-06 NOTE — Unmapped (Signed)
 Chronic liver disease with prior history of hepatic encephalopathy. Forgetfulness potentially related to ammonia levels. Constipation may contribute to increased confusion due to ammonia buildup.  - Monitor bowel movements and adjust lactulose  dosage if constipation persists for more than one day.  - Instructed to take an additional dose of lactulose  if no bowel movement by noon the following day after constipation.  - will ask wife Asked wife to monitor for increased confusion and correlate with bowel movement patterns.  - follow-up with GI as scheduled for MRI abdomen and clinic follow-up

## 2024-03-12 DIAGNOSIS — Z85828 Personal history of other malignant neoplasm of skin: Principal | ICD-10-CM

## 2024-03-12 DIAGNOSIS — Z1283 Encounter for screening for malignant neoplasm of skin: Principal | ICD-10-CM

## 2024-03-12 DIAGNOSIS — L814 Other melanin hyperpigmentation: Principal | ICD-10-CM

## 2024-03-12 DIAGNOSIS — L578 Other skin changes due to chronic exposure to nonionizing radiation: Principal | ICD-10-CM

## 2024-03-12 DIAGNOSIS — L570 Actinic keratosis: Principal | ICD-10-CM

## 2024-03-12 NOTE — Unmapped (Addendum)
 Meet your team:     Your intake nurse is: Bernice     Please remember to fill out the survey you will receive after your visit. Your comments help us  continue to improve our care.      Thanks in advance!      [facility] Dermatology Clinical Staff     Patient Education        Skin Cancer Prevention: Care Instructions  Your Care Instructions     Skin cancer is the abnormal growth of cells in the skin. It usually appears as a growth that changes in color, shape, or size. This can be a sore that does not heal or a change in a mole. Skin cancer is almost always curable when found early and treated. So it is important to see your doctor if you have any of these changes in your skin.  Skin cancer is the most common type of cancer. It often appears on areas of the body that have been exposed to the sun, such as the head, face, neck, back, chest, or shoulders.  Follow-up care is a key part of your treatment and safety. Be sure to make and go to all appointments, and call your doctor if you are having problems. It's also a good idea to know your test results and keep a list of the medicines you take.  How can you care for yourself at home?  Wear a wide-brimmed hat and long sleeves and pants if you are going to be outdoors for a long time.  Avoid the sun between 10 a.m. and 4 p.m., which is the peak time for UV rays.  Wear sunscreen on exposed skin. Make sure to use a broad-spectrum sunscreen that has a sun protection factor (SPF) of 30 or higher. Use it every day, even when it is cloudy.  Do not use tanning booths or sunlamps.  Use lip balm or cream that has sun protection factor (SPF) to protect your lips from getting sunburned.  Wear sunglasses that block UV rays.  When should you call for help?  Watch closely for changes in your health, and be sure to contact your doctor if:    You see a change in your skin, such as a growth or mole that:  Grows bigger. This may happen very slowly.  Changes color.  Changes shape.  Starts to bleed easily.     You have swollen glands in your armpits, groin, or neck.     You do not get better as expected.   Where can you learn more?  Go to [facility] at [email or web address] in the Menu. Enter P392 in the search box to learn more about Skin Cancer Prevention: Care Instructions.  Current as of: July 12, 2023  Content Version: 14.5  ?? 2024-2025 [place], [place].   Care instructions adapted under license by [facility]. If you have questions about a medical condition or this instruction, always ask your healthcare professional. Romayne Alderman, [facility], disclaims any warranty or liability for your use of this information.     Cryosurgery  Cryosurgery (???freezing???) uses liquid nitrogen to destroy certain types of skin lesions. Lowering the temperature of the lesion in a small area surrounding skin destroys the lesion. Immediately following cryosurgery, you will notice redness and swelling of the treatment area. Blistering or weeping may occur, lasting approximately one week which will then be followed by crusting. Most areas will heal completely in 10 to 14 days.  Wash the treated areas daily. Allow soap and water  to run over the areas, but do not scrub. Should a scab or crust form, allow it to fall off on its own. Do not remove or pick at it. Application of an ointment  and a bandage may make you feel more comfortable, but it is not necessary. Some people develop an allergy to Neosporin, so we recommend that Vaseline or  Aquaphor be used.    The cryotherapy site will be more sensitive than your surrounding skin. Keep it covered, and remember to apply sunscreen every day to all your sun exposed skin. A scar may remain which is lighter or pinker than your normal skin. Your body will continue to improve your scar for up to one year; however a light-colored scar may remain.    Infection following cryotherapy is rare. However if you are worried about the appearance of the treated area, contact your doctor. We have a physician on call at all times. If you have any concerns about the site, please call our clinic at [phone] Patient Education        Actinic Keratosis: Care Instructions  Overview  Actinic keratosis is a skin growth caused by sun damage. It can turn into skin cancer, but this isn't common. Actinic keratoses, also called solar keratoses, are small red, brown, or skin-colored scaly patches. They are most common on the scalp, face, neck, hands, and forearms.  Your doctor can remove these growths by freezing or scraping them off or by putting medicines on them.  Follow-up care is a key part of your treatment and safety. Be sure to make and go to all appointments, and call your doctor if you are having problems. It's also a good idea to know your test results and keep a list of the medicines you take.  How can you care for yourself at home?  If your doctor told you how to care for the treated area, follow your doctor's instructions. If you did not get instructions, follow this general advice:  Wash around the area with clean water  2 times a day. Don't use hydrogen peroxide or alcohol. They can slow healing.  You may cover the area with a thin layer of petroleum jelly, such as Vaseline, and a nonstick bandage.  Apply more petroleum jelly, and replace the bandage as needed.  Avoid using an antibiotic ointment unless your doctor recommends it.  How can you help prevent it?  To help prevent getting another actinic keratosis:  Always wear sunscreen on exposed skin. Make sure to use a broad-spectrum sunscreen that has a sun protection factor (SPF) of 30 or higher. Use it every day, even when it is cloudy.  Wear long sleeves, a hat, and pants if you are going to be outdoors for a long time.  Avoid the sun between 10 a.m. and 4 p.m., the peak time for UV rays.  Do not use tanning booths or sunlamps.  When should you call for help?  Watch closely for changes in your health, and be sure to contact your doctor if:    You have symptoms of infection, such as:  Increased pain, swelling, warmth, or redness.  Red streaks leading from the area.  Pus draining from the area.  A fever.     You see a change in your skin, such as a spot, growth, or mole that:  Grows bigger. This may happen slowly.  Changes color.  Changes shape.  Starts to bleed easily.  You have a wound that does not heal.   Where can you learn more?  Go to [facility] at [email or web address] in the Menu. Enter L364 in the search box to learn more about Actinic Keratosis: Care Instructions.  Current as of: July 12, 2023  Content Version: 14.5  ?? 2024-2025 [place], [place].   Care instructions adapted under license by [facility]. If you have questions about a medical condition or this instruction, always ask your healthcare professional. Romayne Alderman, [facility], disclaims any warranty or liability for your use of this information.

## 2024-03-12 NOTE — Unmapped (Signed)
 DERMATOLOGY CLINIC NOTE    ASSESSMENT AND PLAN:     Actinic Keratoses:   - After R/B/A discussed (including scarring, pigment alteration, recurrence, or persistence of the lesion) and verbal consent was obtained, identified  lesions were treated with cryotherapy x 1 ten second freeze-thaw cycle. The patient tolerated the procedure well and was instructed on post-cryotherapy wound care.   Total AK's frozen: 5  Location and number: Ears, face, arms      Benign lesions (seborrheic keratoses, nevi, lentigines):  -Reassurance provided.  -Discussed good photoprotective practices.  Recommended sunscreen with SPF.  -Discussed make us  aware should lesions grow or change significantly and also discussed other worrisome findings such as bleeding.    Personal history of multiple nonmelanoma skin cancers, basal cell carcinomas       Return to clinic: 9 months       CHIEF COMPLAINT:  Follow-up     HPI:   This is a pleasant 68 y.o. male who last saw me on 2//25.  At that time he was seen for actinic keratoses and for a personal history of nonmelanoma skin cancers including a number of basal cell carcinomas.     PAST MEDICAL HISTORY:  Diagnosis Location Biopsy Date Treatment date Procedure Surgeon   [facility] R preauricular   05/2020 Mohs Varma   [facility] L cheek   05/2020 Mohs Varma   [facility] L preauricular   09/2020 Mohs Varma   [facility] L cheek   09/2020 Mohs Varma   [facility] L nose   09/2020 Mohs Varma                    Possible history of the past of basal cell with Dr. Charlott treated years ago     Hepatitis C  Tobacco abuse  Coronary artery disease  Opioid abuse in remission    MEDICATIONS:   Medications Ordered Prior to Encounter[1]    ALLERGIES:   Reviewed in epic    SOCIAL HISTORY:  Lives in [place]     REVIEW OF SYSTEMS:  Baseline state of health. No recent illnesses. No other skin complaints.     PHYSICAL EXAMINATION:  Examination in the presence of chaperone:  General: Well-developed, well-nourished. No acute distress.   Neuro: Alert and oriented, answers questions appropriately.  Skin: Examination of the scalp, face, head, neck, chest, back, upper extremities, lower extremities, hands, palms, soles was performed and notable for the following:  Thin scaly macules and papules on ears, face, arms  Light brown macules on trunk extremities  Moderate to severe photo actinic change     Dictation software was used while making this note. Please excuse any errors made with dictation software.         [1]   Current Outpatient Medications on File Prior to Visit   Medication Sig Dispense Refill    albuterol  HFA 90 mcg/actuation inhaler Inhale 2 puffs every four (4) hours as needed for wheezing. 8 g 3    alendronate  (FOSAMAX ) 70 MG tablet Take 1 tablet (70 mg total) by mouth every seven (7) days. 13 tablet 3    aspirin (ECOTRIN) 81 MG tablet Take 1 tablet (81 mg total) by mouth daily.      atorvastatin  (LIPITOR) 20 MG tablet TAKE 1 TABLET(20 MG) BY MOUTH EVERY NIGHT 90 tablet 3    [START ON 03/19/2024] buprenorphine -naloxone  (SUBOXONE ) 8-2 mg sublingual film Place 1 Film (8 mg of buprenorphine  total) under the tongue two (2) times a day. 60 Film  0    [START ON 04/18/2024] buprenorphine -naloxone  (SUBOXONE ) 8-2 mg sublingual film Place 1 Film (8 mg of buprenorphine  total) under the tongue every twelve (12) hours. 60 Film 0    cholecalciferol, vitamin D3-25 mcg, 1,000 unit,, 25 mcg (1,000 unit) capsule Take 1 capsule (25 mcg total) by mouth daily.      lactulose  10 gram/15 mL solution Take 15 mL (10 g total) by mouth two (2) times a day. 900 mL 6    loratadine (CLARITIN) 10 mg tablet Take 1 tablet (10 mg total) by mouth once as needed.      mirtazapine  (REMERON ) 7.5 MG tablet Take 1 tablet (7.5 mg total) by mouth nightly. 90 tablet 2    naloxone  (NARCAN ) 4 mg nasal spray One spray in either nostril once for known/suspected opioid overdose. May repeat every 2-3 minutes in alternating nostril til EMS arrives 2 each PRN    ondansetron  (ZOFRAN -ODT) 4 MG disintegrating tablet TAKE 1 TABLET BY MOUTH EVERY 12 HOURS AS NEEDED FOR NAUSEA      pantoprazole  (PROTONIX ) 40 MG tablet TAKE 1 TABLET(40 MG) BY MOUTH EVERY NIGHT 90 tablet 3    tamsulosin  (FLOMAX ) 0.4 mg capsule Take 1 capsule (0.4 mg total) by mouth daily. 90 capsule 3     No current facility-administered medications on file prior to visit.

## 2024-03-19 MED ORDER — BUPRENORPHINE 8 MG-NALOXONE 2 MG SUBLINGUAL FILM
ORAL_FILM | Freq: Two times a day (BID) | SUBLINGUAL | 0 refills | 30.00000 days | Status: CP
Start: 2024-03-19 — End: 2024-04-18

## 2024-04-09 ENCOUNTER — Ambulatory Visit: Admit: 2024-04-09 | Discharge: 2024-04-10 | Payer: MEDICARE

## 2024-04-09 DIAGNOSIS — K7460 Unspecified cirrhosis of liver: Principal | ICD-10-CM

## 2024-04-09 DIAGNOSIS — K7682 Hepatic encephalopathy    (CMS-HCC): Principal | ICD-10-CM

## 2024-04-09 LAB — COMPREHENSIVE METABOLIC PANEL
ALBUMIN: 3.5 g/dL (ref 3.4–5.0)
ALKALINE PHOSPHATASE: 44 U/L — ABNORMAL LOW (ref 46–116)
ALT (SGPT): 15 U/L (ref 10–49)
ANION GAP: 10 mmol/L (ref 5–14)
AST (SGOT): 27 U/L (ref <=34)
BILIRUBIN TOTAL: 0.2 mg/dL — ABNORMAL LOW (ref 0.3–1.2)
BLOOD UREA NITROGEN: 9 mg/dL (ref 9–23)
BUN / CREAT RATIO: 10
CALCIUM: 8.7 mg/dL (ref 8.7–10.4)
CHLORIDE: 107 mmol/L (ref 98–107)
CO2: 25 mmol/L (ref 20.0–31.0)
CREATININE: 0.87 mg/dL (ref 0.73–1.18)
EGFR CKD-EPI (2021) MALE: >90 mL/min/1.73m2 (ref >=60)
GLUCOSE RANDOM: 102 mg/dL — ABNORMAL HIGH (ref 70–99)
POTASSIUM: 3.8 mmol/L (ref 3.4–4.8)
PROTEIN TOTAL: 6.6 g/dL (ref 5.7–8.2)
SODIUM: 142 mmol/L (ref 135–145)

## 2024-04-09 LAB — CBC
HEMATOCRIT: 33.5 % — ABNORMAL LOW (ref 39.0–48.0)
HEMOGLOBIN: 11.9 g/dL — ABNORMAL LOW (ref 12.9–16.5)
MEAN CORPUSCULAR HEMOGLOBIN CONC: 35.6 g/dL (ref 32.0–36.0)
MEAN CORPUSCULAR HEMOGLOBIN: 36.1 pg — ABNORMAL HIGH (ref 25.9–32.4)
MEAN CORPUSCULAR VOLUME: 101.4 fL — ABNORMAL HIGH (ref 77.6–95.7)
MEAN PLATELET VOLUME: 7.4 fL (ref 6.8–10.7)
PLATELET COUNT: 158 10*9/L (ref 150–450)
RED BLOOD CELL COUNT: 3.3 10*12/L — ABNORMAL LOW (ref 4.26–5.60)
RED CELL DISTRIBUTION WIDTH: 12.6 % (ref 12.2–15.2)
WBC ADJUSTED: 4.2 10*9/L (ref 3.6–11.2)

## 2024-04-09 LAB — AFP TUMOR MARKER: AFP-TUMOR MARKER: 2 ng/mL (ref <=8)

## 2024-04-09 LAB — PROTIME-INR
INR: 1.22
PROTIME: 13.9 s — ABNORMAL HIGH (ref 9.9–12.6)

## 2024-04-09 MED ORDER — LACTULOSE 10 GRAM/15 ML ORAL SOLUTION
Freq: Two times a day (BID) | ORAL | 6 refills | 30.00000 days | Status: CP
Start: 2024-04-09 — End: ?

## 2024-04-09 NOTE — Unmapped (Signed)
 Reason for Office Follow-up: Decompensated HCV cirrhosis (+HE, -ascites, -upper GI bleed). HCV treatment  ~ 2016    Presentation of Current Illness:    Mr. Staton is a 69 y.o. pleasant Caucasian gentleman who presents today for follow up care. 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 Cured of HCV in 2016 s/p Harvoni x 12 weeks. He was treatment naive with genotype 1a. History of recreational drug use (cocaine IV, intranasal and crack), three tattoos with one being homemade, and incarceration. Fibroscan 20 kPa consistent with F4 (cirrhosis). PMH of prostate cancer (high risk prostate cancer, cT1c, Gleason 4+4=8 in 2/12 cores, PSA 4.59 s/p external beam RT (45Gy) with brachytherapy boost (brachy done 11/05/2018), OSA with overlap COPD, basal cell carcinoma, chronic fungal infection of maxillary sinus, pulmonary nodules, nicotine  dependency, chronic headaches, and renal calculus. More recently he has been diagnosed with neurocognitive impairment and has been following with neurology. Was on remeron  and aricept , but is no longer taking.       Interval hx; LCV 10/2023:   History of Present Illness  Kynan Peasley is a 68 year old male with decompensated cirrhosis who presents for a follow-up visit.    Hepatic encephalopathy  - Episodes of forgetfulness, such as entering a room and forgetting the purpose for being there  - Worsening cognitive symptoms, as observed by his wife  - Currently taking lactulose  twice daily  - Discontinued Xifaxan  due to cost, with perceived worsening of symptoms since discontinuation    Cirrhosis surveillance and hepatic lesions  - Compensated cirrhosis under ongoing surveillance  - Underwent MRI on Saturday to monitor liver lesions  - Traveled to [facility] for MRI due to local facility issues    Fatigue and sleep disturbance  - Persistent fatigue impacting ability to perform previous activities  - Improved sleep quality after reducing soda intake  - Previously consumed up to two liters of Pepsi daily, now decreased    Bowel dysfunction  - Constipation with variable bowel movements, ranging from four times daily to occasional absence of bowel movements  - Takes additional doses of lactulose  as needed for constipation    Nephrolithiasis and urinary tract infection  - History of kidney stones causing significant pain  - Developed urinary tract infection when a stone became lodged    Viral hepatitis exposure and immunity  - Past exposure to hepatitis B, currently immune likely due to vaccination or previous exposure  - History of hepatitis C exposure     Allergies:   Allergies   Allergen Reactions    Bicalutamide  Rash    Penicillins Rash    Rocephin [Ceftriaxone] Rash     Patient received in Emergency Room and reported rash to stomach and back several hours later.      Medications:  Current Outpatient Medications   Medication Sig Dispense Refill    albuterol  HFA 90 mcg/actuation inhaler Inhale 2 puffs every four (4) hours as needed for wheezing. 8 g 3    alendronate  (FOSAMAX ) 70 MG tablet Take 1 tablet (70 mg total) by mouth every seven (7) days. 13 tablet 3    aspirin (ECOTRIN) 81 MG tablet Take 1 tablet (81 mg total) by mouth daily.      atorvastatin  (LIPITOR) 20 MG tablet TAKE 1 TABLET(20 MG) BY MOUTH EVERY NIGHT 90 tablet 3    buprenorphine -naloxone  (SUBOXONE ) 8-2 mg sublingual film Place 1 Film (8 mg of buprenorphine  total) under the tongue two (2) times a day. 60 Film 0    [  START ON 04/18/2024] buprenorphine -naloxone  (SUBOXONE ) 8-2 mg sublingual film Place 1 Film (8 mg of buprenorphine  total) under the tongue every twelve (12) hours. 60 Film 0    cholecalciferol, vitamin D3-25 mcg, 1,000 unit,, 25 mcg (1,000 unit) capsule Take 1 capsule (25 mcg total) by mouth daily.      lactulose  10 gram/15 mL solution Take 15 mL (10 g total) by mouth two (2) times a day. 900 mL 6    loratadine (CLARITIN) 10 mg tablet Take 1 tablet (10 mg total) by mouth once as needed.      mirtazapine  (REMERON ) 7.5 MG tablet Take 1 tablet (7.5 mg total) by mouth nightly. 90 tablet 2    naloxone  (NARCAN ) 4 mg nasal spray One spray in either nostril once for known/suspected opioid overdose. May repeat every 2-3 minutes in alternating nostril til EMS arrives 2 each PRN    ondansetron  (ZOFRAN -ODT) 4 MG disintegrating tablet TAKE 1 TABLET BY MOUTH EVERY 12 HOURS AS NEEDED FOR NAUSEA      pantoprazole  (PROTONIX ) 40 MG tablet TAKE 1 TABLET(40 MG) BY MOUTH EVERY NIGHT 90 tablet 3    tamsulosin  (FLOMAX ) 0.4 mg capsule Take 1 capsule (0.4 mg total) by mouth daily. 90 capsule 3     No current facility-administered medications for this visit.     Active Ambulatory Problems     Diagnosis Date Noted    Chronic headaches     Renal calculus     Tobacco use disorder 02/08/2013    Nasal septum perforation 02/12/2013    Insomnia 02/13/2013    Other emphysema         History of chronic hepatitis 03/05/2014    Osteoporosis of lumbar spine 06/09/2015    Proteinuria 09/28/2017    Prostate cancer     09/11/2018    Cirrhosis (CMS-HCC) 03/14/2019    Sinusitis 06/24/2019    Neoplasm of maxillary sinus 06/24/2019    History of nonmelanoma skin cancer 10/14/2020    Opioid use disorder, mild, in sustained remission (CMS-HCC) 05/31/2022    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    Anemia 05/30/2023     Resolved Ambulatory Problems     Diagnosis Date Noted    Chest pain 02/07/2013    GERD (gastroesophageal reflux disease) 02/08/2013    Skin lesion 02/12/2013    Sialolithiasis of submandibular gland 04/08/2015    Rib pain on left side 06/16/2016    Health care maintenance 06/16/2016    Nonspecific L Lung micronodules, repeat LDCT due 07/12/2017 07/12/2016    Atherosclerosis of coronary artery 08/16/2016    Actinic keratosis 02/01/2017    Elevated PSA 09/28/2017    Benign localized hyperplasia of prostate with urinary obstruction 09/28/2017    Abnormal prostate exam 09/28/2017    Hepatic encephalopathy     03/14/2019    Community acquired pneumonia of right upper lobe of lung 04/07/2019    Opioid use disorder, mild, in sustained remission, on maintenance therapy (CMS-HCC) 02/25/2020    Encephalopathy 10/22/2023     Past Medical History:   Diagnosis Date    Allergic     Allergic rhinitis 2018    Asthma (HHS-HCC)     Basal cell carcinoma     Colon polyp     Coronary artery disease 2018    Enlarged prostate     Hepatitis C     Hx of substance abuse (CMS-HCC)     Hyperlipidemia 2018    Infectious viral hepatitis     Osteoporosis  Skin cancer     Tinnitus 2018    Tobacco abuse 02/08/2013     Past Surgical History:   Procedure Laterality Date    CERVICAL FUSION      cage    COLONOSCOPY  2014    PR COLONOSCOPY FLX DX W/COLLJ SPEC WHEN PFRMD N/A 09/01/2021    Procedure: COLONOSCOPY, FLEXIBLE, PROXIMAL TO SPLENIC FLEXURE; DIAGNOSTIC, W/WO COLLECTION SPECIMEN BY BRUSH OR WASH;  Surgeon: Alphonsa Lav, MD;  Location: [facility] [place];  Service: Gastroenterology    PR COLSC FLX W/RMVL OF TUMOR POLYP LESION SNARE TQ N/A 10/20/2015    Procedure: COLONOSCOPY FLEX; W/REMOV TUMOR/LES BY SNARE;  Surgeon: Sandrea Rollene Mood, MD;  [facility];  Service: Gastroenterology    PR EXCISION SUBMAXILLARY GLAND Right 03/31/2015    Procedure: EXC SUBMANDIBULAR GLAND;  Surgeon: Frederic Marcey Lou, MD;  Location: ASC OR [facility];  Service: ENT    PR REMV UPPER JAW-MAXILLECTOMY Left 07/22/2019    Procedure: MAXILLECTOMY; WO ORBITAL EXENTERATION;  Surgeon: Adam Swaziland Kimple, MD;  Location: ASC OR [facility];  Service: ENT    PR STEREOTACTIC COMP ASSIST PROC,CRANIAL,EXTRADURAL Left 07/22/2019    Procedure: STEREOTACTIC COMPUTER-ASSISTED (NAVIGATIONAL) PROCEDURE; CRANIAL, EXTRADURAL;  Surgeon: Adam Swaziland Kimple, MD;  Location: ASC OR [facility];  Service: ENT    PR UPPER GI ENDOSCOPY,DIAGNOSIS N/A 08/18/2017    Procedure: UGI ENDO, INCLUDE ESOPHAGUS, STOMACH, & DUODENUM &/OR JEJUNUM; DX W/WO COLLECTION SPECIMN, BY BRUSH OR WASH;  Surgeon: Luke Cory Notch, MD;  [facility]: [facility];  Service: Gastroenterology    PR UPPER GI ENDOSCOPY,DIAGNOSIS N/A 11/08/2019    Procedure: UGI ENDO, INCLUDE ESOPHAGUS, STOMACH, & DUODENUM &/OR JEJUNUM; DX W/WO COLLECTION SPECIMN, BY BRUSH OR WASH;  Surgeon: Sim Donna Magnuson, MD;  [facility]: [facility];  Service: Gastroenterology    PROSTATE SURGERY  2020    SKIN BIOPSY      SPINE SURGERY  2009    UPPER GASTROINTESTINAL ENDOSCOPY  2019    VASECTOMY  1988     Family History   Problem Relation Age of Onset    Lung cancer Mother     Bone cancer Mother     Hypertension Mother     COPD Mother     Cancer Mother         lung cancer    Heart attack Father     Hypertension Father     Heart disease Father     Hepatitis Sister     Melanoma Maternal Aunt     Heart disease Maternal Grandfather     Liver disease Brother         2022    Diabetes Paternal Aunt     Cancer Maternal Aunt         lung cancer    Liver disease Brother         2022      Social History     Socioeconomic History    Marital status: Married   Occupational History    Occupation: retired   Tobacco Use    Smoking status: Every Day     Current packs/day: 1.00     Average packs/day: 1 pack/day for 55.6 years (55.6 ttl pk-yrs)     Types: Cigarettes     Start date: 06/16/1969    Smokeless tobacco: Never   Vaping Use    Vaping status: Never Used   Substance and Sexual Activity    Alcohol use: No  Drug use: Not Currently     Types: Cocaine, IV, Marijuana     Comment: History of cocaine use years ago (intranasal, IV and crack), marijuana use.     Sexual activity: Not Currently     Partners: Female     Birth control/protection: Surgical   Other Topics Concern    Do you use sunscreen? Yes    Tanning bed use? No    Are you easily burned? No    Excessive sun exposure? Yes    Blistering sunburns? Yes     Social Drivers of Psychologist, prison and probation services Strain: Low Risk  (09/29/2023)    Overall Financial Resource Strain (CARDIA)     Difficulty of Paying Living Expenses: Not hard at all   Food Insecurity: No Food Insecurity (09/29/2023)    Hunger Vital Sign     Worried About Running Out of Food in the Last Year: Never true     Ran Out of Food in the Last Year: Never true   Transportation Needs: No Transportation Needs (09/29/2023)    PRAPARE - Therapist, art (Medical): No     Lack of Transportation (Non-Medical): No   Housing: Low Risk  (09/29/2023)    Housing     Within the past 12 months, have you ever stayed: outside, in a car, in a tent, in an overnight shelter, or temporarily in someone else's home (i.e. couch-surfing)?: No     Are you worried about losing your housing?: No     Physical Examination:   BP 138/57 (BP Position: Sitting)  - Pulse 72  - Temp 36.6 ??C (97.8 ??F)  - Ht 167.6 cm (5' 5.98)  - Wt 54 kg (119 lb)  - SpO2 96%  - BMI 19.22 kg/m??   Constitutional: in no apparent distress.   Eyes: Anicteric sclerae.   Cardiovascular: trace peripheral edema.   Gastrointestinal: Soft, nontender abdomen without hepatosplenomegaly, hernias or masses.   Neurologic: Nonfocal, no asterixis.   Psychiatric: Alert and oriented to person, place and time. Normal affect.   Temporal wasting         Laboratory Studies:  MELD 3.0: 7 at 10/12/2023  9:23 AM  Computed MELD-Na unavailable. One or more values for this score either were not found within the given timeframe or did not fit some other criterion.  Calculated from:  Serum Creatinine: 0.89 mg/dL (Using min of 1 mg/dL) at 01/08/7973  0:76 AM  Serum Sodium: 142 mmol/L (Using max of 137 mmol/L) at 10/12/2023  9:23 AM  Total Bilirubin: 0.2 mg/dL (Using min of 1 mg/dL) at 01/08/7973  0:76 AM  Serum Albumin: 3.7 g/dL (Using max of 3.5 g/dL) at 01/08/7973  0:76 AM  INR(ratio): 1.12 at 10/12/2023  9:23 AM  Age at listing (hypothetical): 67 years  Sex: Male at 10/12/2023  9:23 AM       Studies:   Fibroscan 06/16/2014: 20.0 kPa/F4  MRI abdomen 10/05/2023:   Impression   1.  Numerous LR-3 observations throughout the liver, as described above. 2.  Small caliber perigastric varices, as seen with portal hypertension.       Assessment & Plan  Cirrhosis secondary to chronic hepatitis C with hepatic encephalopathy. Diagnosed in 2015 on fibroscan. HCV cured in 2016 with Harvoni.     HCV (cured in 2016). HCV cured in 2016 with Harvoni.   Chronic hepatic encephalopathy with episodes of forgetfulness and confusion. Also had dx of neurocognitive impairment. Was optimized  from liver perspective with lactulose  and Xifaxan  but unable to afford Xifaxan  (has worked with our pharmacist and essentially exhausted all avenues to be able to afford this. Discontinuation of Rifaximin  may contribute to confusion (described as forgetfulness/ forgetting why he went into rooms, etc) . No significant impact on daily life or hospitalizations. Potential for infections to exacerbate confusion discussed.  - Reorder lactulose .  - Discuss alternative options for obtaining Rifaximin , such as [facility].   - Encourage monitoring of confusion episodes and consider taking additional lactulose  if confusion worsens.  - Advise wife to monitor for severe confusion and take to hospital if necessary.  HCC screening: MRI with many LR-3 lesions. 10/2023:-> done sat pending read  Variceal Screening: EGD 11/2019 without varices. Repeat upper endoscopy not necessarily recommended if remains well compensated given PLT count >150 and fibroscan <20 kPa.  BLE:  lasix  PRN  OLT: low MELD, HE well controlled, not discussed    Other issues:  Fatigue  Chronic fatigue with no significant change. Previous evaluations included thyroid function and sleep hygiene. Improved sleep noted with reduced soda intake.  - Encourage continued good sleep hygiene and reduced soda intake.    Insomnia  Insomnia improved with reduced soda intake. Mirtazapine  aiding sleep.  - Continue Mirtazapine  at night.    Constipation  Intermittent constipation with four bowel movements a day, occasionally none. Lactulose  used to manage bowel movements. Adequate fiber and water  intake discussed. Lactulose  can be taken hourly if needed.  - Reorder lactulose .  - Encourage adequate fiber and water  intake.  - Advise taking additional lactulose  if bowel movements decrease.    Health maintenance:   --HBV: vaccinated. HBcAb+ (discussed implications with chemo/ certain immunotherapies  --HAV: vaccinated  --Colonoscopy: 2023--> 2028 follow up.         RTC in 6 months       Manuelita T. Rangel Echeverri, ANP- [facility]  [facility]

## 2024-04-18 MED ORDER — BUPRENORPHINE 8 MG-NALOXONE 2 MG SUBLINGUAL FILM
ORAL_FILM | Freq: Two times a day (BID) | SUBLINGUAL | 0 refills | 30.00000 days | Status: CP
Start: 2024-04-18 — End: 2024-05-18

## 2024-04-29 DIAGNOSIS — R1310 Dysphagia, unspecified: Principal | ICD-10-CM

## 2024-05-01 DIAGNOSIS — E785 Hyperlipidemia, unspecified: Principal | ICD-10-CM

## 2024-05-01 MED ORDER — ATORVASTATIN 20 MG TABLET
ORAL_TABLET | Freq: Every evening | ORAL | 3 refills | 0.00000 days
Start: 2024-05-01 — End: ?

## 2024-05-02 MED ORDER — ATORVASTATIN 20 MG TABLET
ORAL_TABLET | Freq: Every evening | ORAL | 3 refills | 100.00000 days | Status: CP
Start: 2024-05-02 — End: ?

## 2024-05-02 NOTE — Unmapped (Signed)
 EGD  Procedure #1     Procedure #2   [id number]  MRN   Generic   Endoscopist     Is the patient's health insurance [employer], [employer], [facility]), [employer], [employer], or Cigna?     Urgent procedure     Are you pregnant? (Ignore if [facility] GI provider has OK'd procedure in order comments despite pregnancy)     Do you have chest pain with physical activity or are you in the process of scheduling or awaiting results of a heart ultrasound, stress test, or catheterization to evaluate new or worsening chest pain, dizziness, or shortness of breath?     Do you have achalasia or gastroparesis or take Mounjaro, Zepbound, [place], Trulicity, Ozempic, Victoza, Saxenda, Byetta, Bydureon, Rybelsus, or Adlyxin?      Do you take: Plavix (clopidogrel), Coumadin (warfarin), Lovenox (enoxaparin), Pradaxa (dabigatran), Effient (prasugrel), Xarelto (rivaroxaban), Eliquis (apixaban), Pletal (cilostazol), or Brilinta (ticagrelor)?          Did ordering provider indicate how long to hold this medication in the order comments?          Which of the above medications are you taking?          What is the name of the medical practice that manages this medication?          What is the name of the medical provider who manages this medication?     Do you have hemophilia, von Willebrand disease, or low platelets?     Do you have a pacemaker or implanted cardiac defibrillator?     Has a [facility] provider specified the location(s)?     Which location(s) did the [facility] provider specify?          [facility]          [place]          [facility]    TRUE  Do you see a liver specialist for chronic liver disease?     Is the procedure indication for variceal screening?     Is procedure indication for variceal banding (this does NOT include variceal screening)?     Have you had a heart attack, stroke or heart stent placement within the past 6 months?     Month of event     Year of event (ONLY ENTER LAST 2 DIGITS)        5  Height (feet)   6  Height (inches)   115  Weight (pounds)   18.6  BMI          Did the ordering provider specify a bowel prep?          What bowel prep was specified?     Do you have an ostomy (bag on your stomach that collects your stool)?          Is it an ileostomy?          Is it a colostomy?          Patient doesn't know.     Do you have chronic kidney disease?     Do you have chronic constipation or have you had poor quality bowel preps for past colonoscopies?     Do you have Crohn's disease or ulcerative colitis?     Have you had weight loss surgery?          Do you ever use supplemental oxygen?     Have you been hospitalized for cirrhosis of the liver or  heart failure in the last 12 months?     Have you been treated for mouth or throat cancer with radiation or surgery?     Have you been told that it is difficult for doctors to insert a breathing tube in you during anesthesia?     Have you had a heart or lung transplant?          Are you on dialysis?     Have you started dialysis in the last 6 months?   TRUE  Do you have cirrhosis of the liver?     Do you have myasthenia gravis?     Is the patient a prisoner?   ################# ## ###################################################################################################################   MRN:  [id number]   Anticoag Review  No   Nurse Triage  No   Procedure(s):  EGD     0   Endoscopist:  Generic    Urgent:  No   Prep:                              --------------------------- --- ----------------------------------------------------------------------------------------------------------------------------------------------------------------------------   [facility] Locations:  [facility]     [facility]              Requested Locations:               ################# ## ###################################################################################################################

## 2024-05-14 DIAGNOSIS — F1111 Opioid abuse, in remission: Principal | ICD-10-CM

## 2024-05-14 DIAGNOSIS — J438 Other emphysema: Principal | ICD-10-CM

## 2024-05-14 NOTE — Unmapped (Signed)
 Please send me a MyChart message with your flu shot information when you had it done (date and location).     Try to minimize use of Zofran .  Take Protonix  when you stomach is upset and see if that helps.     At your next visit in person, plan for blood draw for labs, urine sample, and EKG.

## 2024-05-14 NOTE — Unmapped (Signed)
 [facility] Internal Medicine at [facility]     Are you located in [place]? yes    Reason for visit: Follow up    Questions / Concerns that need to be addressed: None    PTHomeBP     Diabetes:  Regularly checking blood sugars?: no  If yes, when? Complete log for past 7 days  Date Before Breakfast After Breakfast Before Lunch After Lunch Before Dinner After Dinner Before Bed                                                                                                                                   HCDM reviewed and updated in Epic:    We are working to make sure all of our patients??? wishes are updated in Epic and part of that is documenting a Environmental health practitioner for each patient  A Health Care Decision Maker is someone you choose who can make health care decisions for you if you are not able - who would you most want to do this for you????  is already up to date.    HCDM (patient stated preference): Harold, Richards - Spouse - [phone]    HCDM, First Alternate: Harold Richards - Daughter - [phone]    HCDM, Second Alternate: Harold Richards,Harold Richards - Daughter - [phone]    BPAs completed:  PHQ2    __________________________________________________________________________________________    SCREENINGS COMPLETED IN FLOWSHEETS      AUDIT       PHQ2       PHQ9          GAD7       COPD Assessment       Falls Risk

## 2024-05-15 NOTE — Unmapped (Signed)
 Called/spoke with patient to schedule 2 month follow up with PCP. Appointment scheduled

## 2024-05-16 NOTE — Unmapped (Signed)
 [facility] - Faculty Practice  Internal Medicine Clinic Visit    Reason for visit: MOUD treatment, underweight    A/P:    Assessment & Plan  Opioid use disorder, in sustained remission  Opioid use disorder in sustained remission. Managed on Suboxone , effective with no adherence issues.  - Continue Suboxone  as prescribed for next 2 months    Dysphagia  Scheduled for EGD in November to evaluate dysphagia causes.  - Proceed with scheduled EGD in November.    Nausea  Morning nausea managed with ondansetron . Protonix  recommended for acid-related symptoms due to ondansetron 's cardiac risks.  - Use ondansetron  (Zofran ) only as needed.  - Start Protonix  (pantoprazole ) for stomach acid management.  - Order EKG at next clinic visit to assess for potential cardiac effects of ondansetron .    Underweight and appetite stimulation  Weight increased to 115.1 lbs. Mirtazapine  discontinued to evaluate its impact on weight and appetite.  - Discontinue mirtazapine  (Remeron ).  - Monitor weight and appetite.    General Health Maintenance  Scheduled for flu vaccination today at external pharmacy and will send me message with details after vaccination to update chart        Return in about 2 months (around 07/14/2024) for In-person - please schedule 08/13/24 at 8:20am.           __________________________________________________________    HPI:    History of Present Illness  Harold Richards is a 68 year old male who presents with swallowing difficulties and medication management.    Dysphagia  - Difficulty swallowing requiring caution to avoid choking  - Scheduled for endoscopic evaluation in November    Weight fluctuation and appetite stimulation  - Weight fluctuates between 110 and 115.1 pounds  - Takes Remeron  7.5 mg for appetite stimulation  - Uncertain effectiveness of Remeron     Gastrointestinal symptoms  - Nausea and upset stomach most mornings  - Symptoms effectively managed with as-needed Zofran   - Uses Protonix  for gastric acid management    Medication adherence  - Takes Suboxone  twice daily at 4 AM and 3 PM with consistent adherence     __________________________________________________________        Medications:  Reviewed in EPIC  __________________________________________________________    Physical Exam:   Vital Signs:  Vitals:    05/14/24 0855   BP: 120/68   BP Site: L Arm   Pulse: 64   Weight: 52.2 kg (115 lb)   Height: 167.6 cm (5' 6)          PTHomeBP  The patient???s Average Home Blood Pressure during the last two weeks is :   /   based on  readings                 PHQ-9 Score:     GAD-7 Score:       Medication adherence and barriers to the treatment plan have been addressed. Opportunities to optimize healthy behaviors have been discussed. Patient / caregiver voiced understanding.

## 2024-05-30 ENCOUNTER — Inpatient Hospital Stay: Admit: 2024-05-30 | Discharge: 2024-05-30 | Payer: MEDICARE

## 2024-05-30 MED ADMIN — barium sulfate (E-Z-PAQUE) 96 % (w/w) oral suspension: 200 mL | ORAL | @ 13:00:00 | Stop: 2024-05-30

## 2024-05-30 MED ADMIN — barium sulfate (E-Z-DISK) tablet 700 mg: 700 mg | ORAL | @ 13:00:00 | Stop: 2024-05-30

## 2024-06-16 MED ORDER — BUPRENORPHINE 8 MG-NALOXONE 2 MG SUBLINGUAL FILM
ORAL_FILM | Freq: Two times a day (BID) | SUBLINGUAL | 0 refills | 30.00000 days | Status: CP
Start: 2024-06-16 — End: 2024-07-16

## 2024-06-22 DIAGNOSIS — G4709 Other insomnia: Principal | ICD-10-CM

## 2024-06-22 MED ORDER — MIRTAZAPINE 7.5 MG TABLET
ORAL_TABLET | 2 refills | 0.00000 days
Start: 2024-06-22 — End: ?

## 2024-06-23 DIAGNOSIS — H109 Unspecified conjunctivitis: Principal | ICD-10-CM

## 2024-06-23 MED ORDER — POLYMYXIN B SULFATE 10,000 UNIT-TRIMETHOPRIM 1 MG/ML EYE DROPS
OPHTHALMIC | 0 refills | 34.00000 days | Status: CP
Start: 2024-06-23 — End: ?

## 2024-06-23 NOTE — Progress Notes (Signed)
 Assessment and Plan:     Harold Richards was seen today for eye drainage.    Diagnoses and all orders for this visit:    Conjunctivitis, unspecified conjunctivitis type, unspecified laterality  -     polymyxin B sulf-trimethoprim  (POLYTRIM) 10,000 unit- 1 mg/mL Drop; Administer 1 drop to both eyes six (6) times a day.    Patient presents to clinic with symptoms x 1 week  Reports that he started with symptoms in his right eye that then spread to his left eye approximately 2 days ago.  He started with his eye itching.  Then he noticed that there was some erythema to his sclera and then noticed that he started to have a gritty/sandpaper feeling to his eye.  This led to a yellow discharge from his eye and he started to wake up with his eye matted shut.  He denies any eye pain, photophobia, visual disturbances.  Symptoms have persisted and worsened over the last week.  Patient denies any other symptoms.    On exam he is noted to have bilateral conjunctivitis.  Will treat with Polytrim.  He should follow-up with PCP if symptoms persist or worsen    Return if symptoms worsen or fail to improve.    Subjective:     HPI: Harold Richards is a 68 y.o. male here for   Chief Complaint   Patient presents with   ??? Eye Drainage     Started with both eyes yellow drainage a couple of days ago. They itch and feels like something is in them   :    Harold Richards was seen today for eye drainage.    Diagnoses and all orders for this visit:    Conjunctivitis, unspecified conjunctivitis type, unspecified laterality  -     polymyxin B sulf-trimethoprim  (POLYTRIM) 10,000 unit- 1 mg/mL Drop; Administer 1 drop to both eyes six (6) times a day.    Patient presents to clinic with symptoms x 1 week  Reports that he started with symptoms in his right eye that then spread to his left eye approximately 2 days ago.  He started with his eye itching.  Then he noticed that there was some erythema to his sclera and then noticed that he started to have a gritty/sandpaper feeling to his eye.  This led to a yellow discharge from his eye and he started to wake up with his eye matted shut.  He denies any eye pain, photophobia, visual disturbances.  Symptoms have persisted and worsened over the last week.  Patient denies any other symptoms.    On exam he is noted to have bilateral conjunctivitis.  Will treat with Polytrim.  He should follow-up with PCP if symptoms persist or worsen          ROS:   Review of Systems   Constitutional:  Negative for activity change, appetite change, chills, fatigue and fever.   HENT:  Negative for congestion, ear discharge, ear pain, nosebleeds, postnasal drip, rhinorrhea, sinus pressure, sinus pain, sneezing, sore throat and trouble swallowing.    Eyes:  Positive for discharge, redness and itching. Negative for photophobia, pain and visual disturbance.   Respiratory:  Negative for cough, shortness of breath and wheezing.    Cardiovascular:  Negative for chest pain and palpitations.   Gastrointestinal:  Negative for abdominal pain, diarrhea, nausea and vomiting.   Musculoskeletal:  Negative for myalgias.   Neurological:  Negative for headaches.        Review  of systems negative unless otherwise noted as per HPI    Objective:     Visit Vitals  BP 129/67 (BP Position: Sitting)   Pulse 75   Temp 37.3 ??C (99.1 ??F)   Resp 18   Ht 167.6 cm (5' 5.98)   Wt 52.2 kg (115 lb)   SpO2 97%   BMI 18.57 kg/m??          06/23/24 0908   PainSc: 0-No pain        Physical Exam  Vitals reviewed.   Constitutional:       General: He is not in acute distress.     Appearance: He is well-developed and well-groomed. He is not ill-appearing, toxic-appearing or diaphoretic.      Interventions: Face mask in place.   HENT:      Head: Normocephalic and atraumatic.      Right Ear: External ear normal. No middle ear effusion. Tympanic membrane is not perforated, erythematous or bulging.      Left Ear: External ear normal.  No middle ear effusion. Tympanic membrane is not perforated, erythematous or bulging.      Nose: Nose normal. No mucosal edema or rhinorrhea.      Mouth/Throat:      Mouth: Mucous membranes are not pale, not dry and not cyanotic. No oral lesions.      Pharynx: No oropharyngeal exudate, posterior oropharyngeal erythema or uvula swelling.      Tonsils: No tonsillar abscesses.   Eyes:      General: Lids are normal. Vision grossly intact. No scleral icterus.        Right eye: Discharge present.         Left eye: Discharge present.     Extraocular Movements: Extraocular movements intact.      Conjunctiva/sclera:      Right eye: Right conjunctiva is injected. Exudate present.      Left eye: Left conjunctiva is injected. Exudate present.      Pupils: Pupils are equal, round, and reactive to light.   Cardiovascular:      Rate and Rhythm: Normal rate and regular rhythm.      Heart sounds: Normal heart sounds, S1 normal and S2 normal. No murmur heard.  Pulmonary:      Effort: Pulmonary effort is normal. No accessory muscle usage or respiratory distress.      Breath sounds: Normal breath sounds. No stridor. No wheezing or rales.      Comments: Able to speak in complete sentences.  Musculoskeletal:      Cervical back: Normal range of motion and neck supple.   Lymphadenopathy:      Head:      Right side of head: No submandibular, tonsillar, preauricular or posterior auricular adenopathy.      Left side of head: No submandibular, tonsillar, preauricular or posterior auricular adenopathy.      Cervical: No cervical adenopathy.   Skin:     Coloration: Skin is not pale.      Findings: No erythema.   Neurological:      General: No focal deficit present.      Mental Status: He is alert and oriented to person, place, and time.   Psychiatric:         Behavior: Behavior is cooperative.          [facility]:     Medication adherence and barriers to the treatment plan have been addressed. Opportunities to optimize healthy behaviors have been discussed. Patient / caregiver voiced  understanding.

## 2024-06-26 MED ORDER — MIRTAZAPINE 7.5 MG TABLET
ORAL_TABLET | 2 refills | 0.00000 days
Start: 2024-06-26 — End: ?

## 2024-06-27 ENCOUNTER — Ambulatory Visit: Admit: 2024-06-27 | Discharge: 2024-06-28 | Payer: MEDICARE

## 2024-06-27 ENCOUNTER — Ambulatory Visit: Admit: 2024-06-27 | Payer: MEDICARE | Attending: Radiation Oncology | Primary: Radiation Oncology

## 2024-06-27 DIAGNOSIS — C61 Malignant neoplasm of prostate: Principal | ICD-10-CM

## 2024-06-27 LAB — PSA: PROSTATE SPECIFIC ANTIGEN: <0.04 ng/mL (ref 0.00–4.00)

## 2024-06-27 MED ORDER — TAMSULOSIN 0.4 MG CAPSULE
ORAL_CAPSULE | Freq: Every day | ORAL | 3 refills | 90.00000 days | Status: CP
Start: 2024-06-27 — End: ?

## 2024-06-27 NOTE — Progress Notes (Signed)
 RADIATION ONCOLOGY FOLLOW-UP VISIT NOTE     Encounter Date: 06/27/2024  Patient Name: Harold Richards  Medical Record Number: [id number]    DIAGNOSIS:  68 y.o. male with high risk prostate cancer, cT1c, Gleason 4+4=8 in 2/12 cores, PSA 4.59 s/p external beam RT (45Gy) with brachytherapy boost (brachy done 11/05/2018)    DURATION SINCE COMPLETION OF RADIOTHERAPY:  5 years, 8 months (brachy on 11/05/2018)    Lupron #1 (3 month injection) 07/17/2018  Lupron #2 (3 month injection) 10/16/2018  Lupron #3 (3 month injection) 01/16/2019  Lupron #4 (1 month injection) 04/18/2019  Eligard  #5 (3 month injection) 05/29/2019  Eligard  #6 (3 month injection) 08/28/2019  Eligard  #7 (3 month injection) 11/27/2019    ASSESSMENT:  Disease Status: No Evidence of Disease (NED).  PSA today  <0.04    RECOMMENDATIONS:  PSA today is <0.04 no evidence of disease  ADT:  Completed- last injection 11/27/2019  Hot flashes:  Resolved  GU: Doing fine on flomax  once daily- refill provided today  GI:  No issues since RT- has some baseline issues related to lactulose  use. Colonoscopy last done 08/2021.  FOLLOW-UP:  He is now 5 years out from RT and will extend follow-up to q34m.  He will return in 12 months for routine follow-up and PSA check    INTERVAL HISTORY:    Feeling quite well today without any major issues to discuss.  Urination is doing well on flomax  once daily- good stream, nocturia 2-3x, mild frequency, occasional urgency.  No further episodes of hematuria.  He does follow with urology for a history of kidney stones.  No new GI issues- bowels are at baseline without any worsening diarrhea (takes lactulose  at baseline).  No bleeding, no pain.  No other new medical issues.  Just [place] with his youngest granddaughter (age 12) and had a great time.  Looking forward to spending time with them over the holidays.    Baseline  General: Very fatigued. No ED  Urinary: On flomax , post-void residual 0 cc. Has frequency, nocturia 2-3x/night, no urgency, hematuria, dysuria.   GI: Denies loose stools, rectal pain, or bleeding.   Colonoscopy: Done 10/2015, found polyp.  Repeat in 5 years.    REVIEW OF SYSTEMS:  A comprehensive review of 10 systems was negative except for pertinent positives noted in HPI.    PAST MEDICAL HISTORY/FAMILY HISTORY/SOCIAL HISTORY:  Reviewed in EPIC    ALLERGIES/MEDICATIONS:  Reviewed in EPIC    PHYSICAL EXAM:  Vital Signs for this encounter:   BP 112/64  - Pulse 63  - Temp 36.6 ??C (97.8 ??F) (Temporal)  - Resp 18  - Wt 51 kg (112 lb 6.4 oz)  - SpO2 98%  - BMI 18.15 kg/m??   Karnofsky/Lansky Performance Status: 90,  Able to carry on normal activity; minor signs or symptoms of disease (ECOG equivalent 0)  General:   No acute distress, alert and oriented X 4   Head: Normocephalic, without obvious abnormality, atraumatic  Eyes: EOMI, no scleral icterus  Lungs: Normal work of breathing  Abdomen: non-distended  Extremities: extremities normal, atraumatic, no cyanosis or edema  Lymph nodes: No palpable supraclavicular or cervical lymphadenopathy  Neurologic: Grossly normal   Rectal: Deferred      RADIOLOGY:  No new imaging to review    Labs:    PSA   Date Value Ref Range Status   06/27/2024 <0.04 0.00 - 4.00 ng/mL Final   06/29/2023 <0.04 0.00 - 4.00 ng/mL Final  12/27/2022 <0.04 0.00 - 4.00 ng/mL Final   06/28/2022 <0.04 0.00 - 4.00 ng/mL Final   12/23/2021 <0.04 0.00 - 4.00 ng/mL Final   11/05/2021 0.06 0.00 - 4.00 ng/mL Final   06/23/2021 <0.04 0.00 - 4.00 ng/mL Final   03/23/2021 <0.04 0.00 - 4.00 ng/mL Final   12/15/2020 <0.04 0.00 - 4.00 ng/mL Final   09/15/2020 <0.04 0.00 - 4.00 ng/mL Final   05/28/2020 <0.04 0.00 - 4.00 ng/mL Final   02/27/2020 <0.04 0.00 - 4.00 ng/mL Final   08/28/2019 <0.10 0.00 - 4.00 ng/mL Final   01/16/2019 <0.10 0.00 - 4.00 ng/mL Final   05/18/2018 4.59 (H) 0.00 - 4.00 ng/mL Final     This encounter was related to my continuous and active collaborative plan of care related to this patient's serious (or complex) condition       Trenyce Loera, MD  Assistant Professor  [facility] Dept of Radiation Oncology  06/27/2024

## 2024-06-27 NOTE — Progress Notes (Signed)
 Occasional bowel urgency. Occasional urgency to have a BM but unable to have a BM. No other bowel issues reported.    Urinary flow fairly easy. Urinates x2-3 times nights and x9-12 during the day. No pain or burning. Occasional urgency. No leakage.    Taking Flomax  as prescribed.

## 2024-07-02 ENCOUNTER — Inpatient Hospital Stay: Admit: 2024-07-02 | Discharge: 2024-07-03 | Payer: MEDICARE

## 2024-07-02 DIAGNOSIS — R0602 Shortness of breath: Principal | ICD-10-CM

## 2024-07-02 DIAGNOSIS — J45901 Unspecified asthma with (acute) exacerbation: Principal | ICD-10-CM

## 2024-07-02 DIAGNOSIS — J069 Acute upper respiratory infection, unspecified: Principal | ICD-10-CM

## 2024-07-02 DIAGNOSIS — R070 Pain in throat: Principal | ICD-10-CM

## 2024-07-02 MED ORDER — PREDNISONE 50 MG TABLET
ORAL_TABLET | Freq: Every day | ORAL | 0 refills | 5.00000 days | Status: CP
Start: 2024-07-02 — End: ?

## 2024-07-02 MED ADMIN — ipratropium-albuterol (DUO-NEB) 0.5-2.5 mg/3 mL nebulizer solution 3 mL: 3 mL | RESPIRATORY_TRACT | @ 14:00:00 | Stop: 2024-07-02

## 2024-07-02 NOTE — Progress Notes (Signed)
 [facility] URGENT CARE AT [facility]  Provider Note  07/02/2024    Patient Name: Harold Richards    Date of Birth: 1956/04/03    MRN: [id number]   Date of Service: 07/02/24     ASSESSMENT/PLAN:  Final diagnoses:   Upper respiratory tract infection, unspecified type (Primary)   Exacerbation of asthma, unspecified asthma severity, unspecified whether persistent (HHS-HCC)   Throat pain   Shortness of breath       Harold Richards is a 68 y.o. male with PMH of emphysema, asthma, chronic hepatitis, allergic rhinitis, CAD, prostate cancer, susbtance abuse who comes in for 2 day history of URI symptoms with subjective fever and shortness of breath. Overall well appearing without acute distress. VSS. HRRR. Frequent coughs. Expiratory wheezing to bilateral mid-lower fields.    CXR without acute cardiopulmonary changes. COVID/flu/rsv testing negative. Strep negative.    Duoneb x 1: improved symptoms. LCTAB.    Will start prednisone  for  asthma exacerbation. Other symptomatic treatment with close monitoring discussed. Return precautions given. Patient expresses understanding and agrees to plan. Discharged in stable condition.     New Prescriptions    PREDNISONE  (DELTASONE ) 50 MG TABLET    Take 1 tablet (50 mg total) by mouth daily.       Patient Disposition  Follow-up with PCP      This note was transcribed using Dragon voice recognition software, and may contain inadvertent misspellings or incorrect transcriptions    ------------------------------------------------------------------------------------------------------------------------------------------------    Chief Complaint   Patient presents with    URI     Headache, sore throat, cough, Nasal congestion, Body aches, chills X 2-3 days        SUBJECTIVE:     HPI: 68 y.o., male comes in for 2 day history of URI symptoms. Has had headaches, sore throat, cough, nasal congestion. Has had body aches, chills without known fever. Has had some shortness of breath that is minimally improved with albuterol     Past Medical History:  Past Medical History[1]      Past Surgical History:  Past Surgical History[2]     Medications:  Prior to Admission medications   Medication Sig Start Date End Date Taking? Authorizing Provider   albuterol  HFA 90 mcg/actuation inhaler Inhale 2 puffs every four (4) hours as needed for wheezing. 06/26/20  Yes Gladman, Wanda Bumpers, MD   alendronate  (FOSAMAX ) 70 MG tablet Take 1 tablet (70 mg total) by mouth every seven (7) days. 12/12/23 12/11/24 Yes Melodye Clarita Czar, MD   aspirin (ECOTRIN) 81 MG tablet Take 1 tablet (81 mg total) by mouth daily.   Yes [provider]   atorvastatin  (LIPITOR) 20 MG tablet TAKE 1 TABLET(20 MG) BY MOUTH EVERY NIGHT 05/02/24  Yes Gladman, Wanda Bumpers, MD   buprenorphine -naloxone  (SUBOXONE ) 8-2 mg sublingual film Place 1 Film (8 mg of buprenorphine  total) under the tongue every twelve (12) hours. 06/16/24 07/16/24 Yes Gladman, Wanda Bumpers, MD   buprenorphine -naloxone  (SUBOXONE ) 8-2 mg sublingual film Place 1 Film (8 mg of buprenorphine  total) under the tongue two (2) times a day. 07/16/24 08/15/24 Yes Gladman, Wanda Bumpers, MD   cholecalciferol, vitamin D3-25 mcg, 1,000 unit,, 25 mcg (1,000 unit) capsule Take 1 capsule (25 mcg total) by mouth daily.   Yes [provider]   lactulose  10 gram/15 mL solution Take 15 mL (10 g total) by mouth two (2) times a day. 04/09/24  Yes Yoxheimer, Manuelita DASEN, AGNP   loratadine (CLARITIN) 10 mg tablet Take 1 tablet (10  mg total) by mouth once as needed.   Yes [provider]   naloxone  (NARCAN ) 4 mg nasal spray One spray in either nostril once for known/suspected opioid overdose. May repeat every 2-3 minutes in alternating nostril til EMS arrives 11/22/21  Yes Kimel-Scott, Darice Norris, MD   ondansetron  (ZOFRAN -ODT) 4 MG disintegrating tablet TAKE 1 TABLET BY MOUTH EVERY 12 HOURS AS NEEDED FOR NAUSEA 09/10/23  Yes [provider]   pantoprazole  (PROTONIX ) 40 MG tablet TAKE 1 TABLET(40 MG) BY MOUTH EVERY NIGHT 07/31/23  Yes Gladman, Wanda Bumpers, MD   polymyxin B  sulf-trimethoprim  (POLYTRIM ) 10,000 unit- 1 mg/mL Drop Administer 1 drop to both eyes six (6) times a day. 06/23/24  Yes Gayle Verneita Molt, FNP   tamsulosin  (FLOMAX ) 0.4 mg capsule Take 1 capsule (0.4 mg total) by mouth daily. 06/27/24  Yes Pearlstein, Franky Blunt, MD       Allergies:  Allergies[3]     ROS:  10 point ROS reviewed and negative unless otherwise specified in HPI.    OBJECTIVE:  Vitals:    07/02/24 0858   BP: 159/79   BP Position: Sitting   BP Cuff Size: Large   Pulse: 77   Resp: 16   Temp: 37.2 ??C (99 ??F)   TempSrc: Oral   SpO2: 97%   Weight: 51.7 kg (114 lb)   Height: 167.6 cm (5' 5.98)      Physical Exam  Constitutional:       General: He is not in acute distress.     Appearance: Normal appearance. He is not ill-appearing or toxic-appearing.   HENT:      Head: Normocephalic and atraumatic.      Nose:      Right Sinus: No maxillary sinus tenderness or frontal sinus tenderness.      Left Sinus: No maxillary sinus tenderness or frontal sinus tenderness.      Mouth/Throat:      Mouth: Mucous membranes are moist.      Pharynx: Oropharynx is clear. Uvula midline.   Cardiovascular:      Rate and Rhythm: Normal rate and regular rhythm.   Pulmonary:      Effort: Pulmonary effort is normal.      Comments: Frequent coughs. Expiratory wheezing to bilateral mid-lower fields.   Musculoskeletal:      Cervical back: Normal range of motion and neck supple.   Skin:     General: Skin is warm and dry.   Neurological:      Mental Status: He is alert and oriented to person, place, and time.        Lab Results: (Lab results reviewed)   No results found for this visit on 07/02/24.    Radiology Results:  XR Chest 2 views  Result Date: 07/02/2024  EXAM: XR CHEST 2 VIEWS ACCESSION: [id number] UN REPORT DATE: 07/02/2024 9:21 AM     CLINICAL INDICATION: In Clinic ; SHORTNESS OF BREATH  - R06.02 - Shortness of breath TECHNIQUE: PA and Lateral Chest Radiographs     COMPARISON: Chest radiograph 05/27/2019     FINDINGS:     Lungs are clear. No pleural effusion or pneumothorax.     Normal heart size and mediastinal contours.             Normal chest.                         [1]   Past Medical History:  Diagnosis Date  Actinic keratosis     Allergic     Allergic rhinitis 2018    Anemia 05/30/2023    on iron supplement but still has not resolved    Asthma (HHS-HCC)     Basal cell carcinoma     Chronic headaches     Colon polyp     Coronary artery disease 2018    Dental disease 2005    dentures    Elevated PSA 2019    Enlarged prostate     GERD (gastroesophageal reflux disease) 02/08/2013    Hepatitis C     HL (hearing loss) 2023    Hx of substance abuse (CMS-HCC)     hx of recreational drug use (cocaine IV, intranasal and crack),     Hyperlipidemia 2018    Infectious viral hepatitis     Lung nodule seen on imaging study 07/12/2016    Osteoporosis     Other emphysema (CMS-HCC)     Prostate cancer    (CMS-HCC) October 2019    Renal calculus     Skin cancer     Tinnitus 2018    Tobacco abuse 02/08/2013   [2]   Past Surgical History:  Procedure Laterality Date    CERVICAL FUSION      cage    COLONOSCOPY  2014    COSMETIC SURGERY  2021    MOHS    ORTHOPEDIC SURGERY  2005    neck    PR COLONOSCOPY FLX DX W/COLLJ SPEC WHEN PFRMD N/A 09/01/2021    Procedure: COLONOSCOPY, FLEXIBLE, PROXIMAL TO SPLENIC FLEXURE; DIAGNOSTIC, W/WO COLLECTION SPECIMEN BY BRUSH OR WASH;  Surgeon: Alphonsa Lav, MD;  Location: [facility] PROCEDURES [place];  Service: Gastroenterology    PR COLSC FLX W/RMVL OF TUMOR POLYP LESION SNARE TQ N/A 10/20/2015    Procedure: COLONOSCOPY FLEX; W/REMOV TUMOR/LES BY SNARE;  Surgeon: Sandrea Rollene Mood, MD;  [facility]: [facility];  Service: Gastroenterology    PR EXCISION SUBMAXILLARY GLAND Right 03/31/2015    Procedure: EXC SUBMANDIBULAR GLAND;  Surgeon: Frederic Marcey Lou, MD;  Location: ASC OR [facility]; Service: ENT    PR REMV UPPER JAW-MAXILLECTOMY Left 07/22/2019    Procedure: MAXILLECTOMY; WO ORBITAL EXENTERATION;  Surgeon: Adam Jordan Kimple, MD;  Location: ASC OR [facility];  Service: ENT    PR STEREOTACTIC COMP ASSIST PROC,CRANIAL,EXTRADURAL Left 07/22/2019    Procedure: STEREOTACTIC COMPUTER-ASSISTED (NAVIGATIONAL) PROCEDURE; CRANIAL, EXTRADURAL;  Surgeon: Adam Jordan Kimple, MD;  Location: ASC OR [facility];  Service: ENT    PR UPPER GI ENDOSCOPY,DIAGNOSIS N/A 08/18/2017    Procedure: UGI ENDO, INCLUDE ESOPHAGUS, STOMACH, & DUODENUM &/OR JEJUNUM; DX W/WO COLLECTION SPECIMN, BY BRUSH OR WASH;  Surgeon: Luke Cory Notch, MD;  [facility]: [facility];  Service: Gastroenterology    PR UPPER GI ENDOSCOPY,DIAGNOSIS N/A 11/08/2019    Procedure: UGI ENDO, INCLUDE ESOPHAGUS, STOMACH, & DUODENUM &/OR JEJUNUM; DX W/WO COLLECTION SPECIMN, BY BRUSH OR WASH;  Surgeon: Sim Donna Magnuson, MD;  [facility]: [facility];  Service: Gastroenterology    PROSTATE SURGERY  2020    SKIN BIOPSY      SPINE SURGERY  2009    UPPER GASTROINTESTINAL ENDOSCOPY  2019    VASECTOMY  1988   [3]   Allergies  Allergen Reactions    Bicalutamide  Rash    Penicillins Rash    Rocephin [Ceftriaxone] Rash     Patient received in Emergency Room and reported rash to stomach and back several hours later.

## 2024-07-02 NOTE — Patient Instructions (Addendum)
 COVID/Flu/RSV testing negative. Chest xray negative    Start prednisone  as directed  Over the counter medications  Fluticasone  (flonase ) or azelastine (astepro) nasal spray for nasal drainage  Guaifenesin  (mucinex ) for chest congestion  Dextromethorphan (delsym/robitussin) for cough  Pseudoephedrine (sudafed) for sinus pressure/nasal drainage (do not use if history of high blood pressure)  Tylenol /ibuprofen for fever, pain  Hydration, your urine should be clear to pale yellow in color    Follow up with PCP if symptoms not improving. If develop worsening shortness of breath, chest pain, please go to the emergency department for further evaluation

## 2024-07-13 ENCOUNTER — Inpatient Hospital Stay: Admit: 2024-07-13 | Discharge: 2024-07-14 | Payer: MEDICARE

## 2024-07-13 DIAGNOSIS — J189 Pneumonia, unspecified organism: Principal | ICD-10-CM

## 2024-07-13 DIAGNOSIS — R059 Cough, unspecified type: Principal | ICD-10-CM

## 2024-07-13 MED ORDER — BENZONATATE 200 MG CAPSULE
ORAL_CAPSULE | Freq: Three times a day (TID) | ORAL | 0 refills | 10.00000 days | Status: CP | PRN
Start: 2024-07-13 — End: ?

## 2024-07-13 MED ORDER — LEVOFLOXACIN 500 MG TABLET
ORAL_TABLET | Freq: Every day | ORAL | 0 refills | 10.00000 days | Status: CP
Start: 2024-07-13 — End: 2024-07-23

## 2024-07-13 MED ORDER — PROMETHAZINE-DM 6.25 MG-15 MG/5 ML ORAL SYRUP
Freq: Four times a day (QID) | ORAL | 0 refills | 9.00000 days | Status: CP | PRN
Start: 2024-07-13 — End: ?

## 2024-07-13 NOTE — Progress Notes (Signed)
 Name:  Harold Richards  DOB: 15-Jun-1956  Date: 07/13/2024    ASSESSMENT/PLAN:  Harold Richards was seen today for sore throat.    Diagnoses and all orders for this visit:    Pneumonia of left lower lobe due to infectious organism  -     levoFLOXacin  (LEVAQUIN ) 500 MG tablet; Take 1 tablet (500 mg total) by mouth daily for 10 days.    Cough, unspecified type  -     XR Chest 2 views    Other orders  -     benzonatate  (TESSALON ) 200 MG capsule; Take 1 capsule (200 mg total) by mouth Three (3) times a day as needed.  -     promethazine -dextromethorphan (PROMETHAZINE -DM) 6.25-15 mg/5 mL syrup; Take 5 mL by mouth four (4) times a day as needed.      Harold Richards is a 68 y.o. male with a PMH of chronic hepatitis, cirrhosis, asthma, prostate CA, CAD, HLD and OUD who presented to urgent care with worsening cough and congestion over the past 2-1/2 weeks.  Patient had negative COVID/flu/RSV testing on 1125 and at that time was started on steroids.  Patient states he has not improved.  Vital signs were stable in clinic today and lungs had no wheezing on exam.  Patient does have a very harsh, bronchospastic cough on exam.  A chest x-ray to my independent interpretation as well as to the radiologist interpretation reveals an infiltrate in the left lower lobe that is new since 11/25.  Patient is allergic to both the penicillin and cephalosporin family and thus standard treatment for pneumonia is not available.  Patient was started on Levaquin  500 mg p.o. for 10 days.  We discussed that he should keep an eye out for any tendon type issues.  We will hold off with any treatment with steroids at this point as he has already received steroids recently and is currently not having any wheezing.  He may continue to use his albuterol  inhaler or nebulizer at home as needed.  Patient was also given Tessalon  Perles for daytime cough and Promethazine  DM syrup for nighttime cough.  We discussed that he should see his PCP for repeat chest x-ray in 4 to 6 weeks to ensure resolution of the infection.    Patient is to rest and drink plenty of fluids and may take Tylenol /ibuprofen as needed for headache, body aches and/or fever.   Over-the-counter cough and sore throat medications may be taken as needed also.  Patient is to follow-up with their PCP if symptoms are not improving.  Patient is to go to the emergency department for increasing shortness of breath, severe chest or abdominal pain, rapid heartbeat, severe headache, persistent high fever or vomiting or for any other worsening symptoms.  Preprinted instructions regarding pneumonia and asthma were provided.  Patient expressed understanding of instructions prior to discharge and was discharged home in good condition.   ------------------------------------------------------------------------------    Chief Complaint   Patient presents with    Sore Throat     Pt stated that he do have sore throat  and can not swallow with sever cough, headache since 06/27/24 and pt is not feeling better.       HPI: Harold Richards is a 68 y.o. male with a past medical history of chronic hepatitis, cirrhosis, asthma, prostate CA, CAD, HLD and OUD who presents to urgent care with complaints of 2-1/2 weeks of worsening sinus congestion and cough.  Patient was seen in clinic on 1125  after he did been sick for several days.  He had a negative COVID/flu/RSV testing and a negative chest x-ray at that point.  His cough was thought to likely be an exacerbation of his asthma and he was started on some steroids.  Patient states that he has continued to worsen and has not been improving.  The cough has been productive of some greenish sputum.  He notes a lot of postnasal drip and sinus pain and pressure as well.  He started developing a sore throat about a week ago as well.  He is having a lot of runny nose and congestion.  He has had subjective fever with some chills but has not documented a fever.  He does note fatigue, body aches and headache and decreased energy.  He denies any nausea, vomiting or diarrhea.  He does note some increased shortness of breath at times and some mild discomfort in his chest with coughing but notes more lower abdominal discomfort with a cough.  He has been taking some over-the-counter Tylenol  and NyQuil and DayQuil.    ROS:  Review of systems as above.  Rest of review of systems negative unless otherwise noted as per HPI.    I have reviewed past medical, surgical, medications, allergies, social and family histories today and updated them in Epic where appropriate.    PMH:  Past Medical History[1]    SURGICAL HX:  Past Surgical History[2]    MEDS:  Current Medications[3]    ALL:  Allergies[4]    SH:  Short Social History[5]    FH:  Family History[6]    VITALS:  Vitals:    07/13/24 0850   BP: 152/71   Pulse: 90   Resp: 14   Temp: 36.7 ??C (98.1 ??F)   SpO2: 94%     Body mass index is 17.43 kg/m??.    Physical Exam  Vitals and nursing note reviewed.   Constitutional:       General: He is not in acute distress.     Comments: Cachectic, male in no acute distress with persistent coughing.   HENT:      Head: Normocephalic and atraumatic.      Nose: Congestion present.      Mouth/Throat:      Mouth: Mucous membranes are moist.      Pharynx: Oropharynx is clear. No oropharyngeal exudate or posterior oropharyngeal erythema.      Comments: Postnasal drip noted in the posterior oropharynx.  Eyes:      Conjunctiva/sclera: Conjunctivae normal.      Pupils: Pupils are equal, round, and reactive to light.   Cardiovascular:      Rate and Rhythm: Normal rate and regular rhythm.      Pulses: Normal pulses.      Heart sounds: Normal heart sounds. No murmur heard.     No friction rub. No gallop.   Pulmonary:      Effort: Pulmonary effort is normal. No respiratory distress.      Breath sounds: Normal breath sounds. No stridor. No wheezing, rhonchi or rales.   Abdominal:      Palpations: Abdomen is soft.      Tenderness: There is no abdominal tenderness.   Musculoskeletal:         General: No swelling. Normal range of motion.      Cervical back: Neck supple.   Lymphadenopathy:      Cervical: No cervical adenopathy.   Skin:     General: Skin is warm and  dry.      Findings: No rash.   Neurological:      General: No focal deficit present.      Mental Status: He is alert and oriented to person, place, and time.   Psychiatric:         Mood and Affect: Mood normal.         Behavior: Behavior normal.       TEST  RESULTS:    Impression      New left lower lobe airspace disease consistent with pneumonia.              Narrative   EXAM: XR CHEST 2 VIEWS   ACCESSION: [id number]   REPORT DATE: 07/13/2024 9:25 AM      CLINICAL INDICATION: In Clinic ; COUGH  - R05.9 - Cough, unspecified type        TECHNIQUE: PA and Lateral Chest Radiographs      COMPARISON: XR CHEST 2 VIEWS 07/02/2024, XR CHEST 2 VIEWS 05/27/2019      FINDINGS:      New left lower lobe posterior basal segment airspace consolidation. No pleural effusion or pneumothorax.      Normal heart size and mediastinal contours.       PATIENT DISPOSITION:    Follow-up with PCP       Rossi Silvestro L. Nicholaus, MD  [facility] [place]/ [place]  ----------------------------------------------------------------  Note - This record has been created using [employer]. Chart creation errors have been sought, but may not always have been located. Such creation errors do not reflect on the standard of medical care.       [1]   Past Medical History:  Diagnosis Date    Actinic keratosis     Allergic     Allergic rhinitis 2018    Anemia 05/30/2023    on iron supplement but still has not resolved    Asthma (HHS-HCC)     Basal cell carcinoma     Chronic headaches     Colon polyp     Coronary artery disease 2018    Dental disease 2005    dentures    Elevated PSA 2019    Enlarged prostate     GERD (gastroesophageal reflux disease) 02/08/2013    Hepatitis C     HL (hearing loss) 2023    Hx of substance abuse (CMS-HCC)     hx of recreational drug use (cocaine IV, intranasal and crack),     Hyperlipidemia 2018    Infectious viral hepatitis     Lung nodule seen on imaging study 07/12/2016    Osteoporosis     Other emphysema (CMS-HCC)     Prostate cancer    (CMS-HCC) October 2019    Renal calculus     Skin cancer     Tinnitus 2018    Tobacco abuse 02/08/2013   [2]   Past Surgical History:  Procedure Laterality Date    CERVICAL FUSION      cage    COLONOSCOPY  2014    COSMETIC SURGERY  2021    MOHS    ORTHOPEDIC SURGERY  2005    neck    PR COLONOSCOPY FLX DX W/COLLJ SPEC WHEN PFRMD N/A 09/01/2021    Procedure: COLONOSCOPY, FLEXIBLE, PROXIMAL TO SPLENIC FLEXURE; DIAGNOSTIC, W/WO COLLECTION SPECIMEN BY BRUSH OR WASH;  Surgeon: Alphonsa Lav, MD;  Location: [facility]EDURES [place];  Service: Gastroenterology    PR COLSC FLX W/RMVL OF TUMOR POLYP LESION SNARE  TQ N/A 10/20/2015    Procedure: COLONOSCOPY FLEX; W/REMOV TUMOR/LES BY SNARE;  Surgeon: Sandrea Rollene Mood, MD;  [facility];  Service: Gastroenterology    PR EXCISION SUBMAXILLARY GLAND Right 03/31/2015    Procedure: EXC SUBMANDIBULAR GLAND;  Surgeon: Frederic Marcey Lou, MD;  Location: ASC OR [facility];  Service: ENT    PR REMV UPPER JAW-MAXILLECTOMY Left 07/22/2019    Procedure: MAXILLECTOMY; WO ORBITAL EXENTERATION;  Surgeon: Adam Jordan Kimple, MD;  Location: ASC OR [facility];  Service: ENT    PR STEREOTACTIC COMP ASSIST PROC,CRANIAL,EXTRADURAL Left 07/22/2019    Procedure: STEREOTACTIC COMPUTER-ASSISTED (NAVIGATIONAL) PROCEDURE; CRANIAL, EXTRADURAL;  Surgeon: Adam Jordan Kimple, MD;  Location: ASC OR [facility];  Service: ENT    PR UPPER GI ENDOSCOPY,DIAGNOSIS N/A 08/18/2017    Procedure: UGI ENDO, INCLUDE ESOPHAGUS, STOMACH, & DUODENUM &/OR JEJUNUM; DX W/WO COLLECTION SPECIMN, BY BRUSH OR WASH;  Surgeon: Luke Cory Notch, MD;  [facility]: [facility];  Service: Gastroenterology    PR UPPER GI ENDOSCOPY,DIAGNOSIS N/A 11/08/2019    Procedure: UGI ENDO, INCLUDE ESOPHAGUS, STOMACH, & DUODENUM &/OR JEJUNUM; DX W/WO COLLECTION SPECIMN, BY BRUSH OR WASH;  Surgeon: Sim Donna Magnuson, MD;  [facility];  Service: Gastroenterology    PROSTATE SURGERY  2020    SKIN BIOPSY      SPINE SURGERY  2009    UPPER GASTROINTESTINAL ENDOSCOPY  2019    VASECTOMY  1988   [3]   Current Outpatient Medications:     albuterol  HFA 90 mcg/actuation inhaler, Inhale 2 puffs every four (4) hours as needed for wheezing., Disp: 8 g, Rfl: 3    alendronate  (FOSAMAX ) 70 MG tablet, Take 1 tablet (70 mg total) by mouth every seven (7) days., Disp: 13 tablet, Rfl: 3    aspirin (ECOTRIN) 81 MG tablet, Take 1 tablet (81 mg total) by mouth daily., Disp: , Rfl:     atorvastatin  (LIPITOR) 20 MG tablet, TAKE 1 TABLET(20 MG) BY MOUTH EVERY NIGHT, Disp: 100 tablet, Rfl: 3    buprenorphine -naloxone  (SUBOXONE ) 8-2 mg sublingual film, Place 1 Film (8 mg of buprenorphine  total) under the tongue every twelve (12) hours., Disp: 60 Film, Rfl: 0    [START ON 07/16/2024] buprenorphine -naloxone  (SUBOXONE ) 8-2 mg sublingual film, Place 1 Film (8 mg of buprenorphine  total) under the tongue two (2) times a day., Disp: 60 Film, Rfl: 0    cholecalciferol, vitamin D3-25 mcg, 1,000 unit,, 25 mcg (1,000 unit) capsule, Take 1 capsule (25 mcg total) by mouth daily., Disp: , Rfl:     lactulose  10 gram/15 mL solution, Take 15 mL (10 g total) by mouth two (2) times a day., Disp: 900 mL, Rfl: 6    loratadine (CLARITIN) 10 mg tablet, Take 1 tablet (10 mg total) by mouth once as needed., Disp: , Rfl:     naloxone  (NARCAN ) 4 mg nasal spray, One spray in either nostril once for known/suspected opioid overdose. May repeat every 2-3 minutes in alternating nostril til EMS arrives, Disp: 2 each, Rfl: PRN    ondansetron  (ZOFRAN -ODT) 4 MG disintegrating tablet, TAKE 1 TABLET BY MOUTH EVERY 12 HOURS AS NEEDED FOR NAUSEA, Disp: , Rfl:     pantoprazole  (PROTONIX ) 40 MG tablet, TAKE 1 TABLET(40 MG) BY MOUTH EVERY NIGHT, Disp: 90 tablet, Rfl: 3    polymyxin B  sulf-trimethoprim  (POLYTRIM ) 10,000 unit- 1 mg/mL Drop, Administer 1 drop to both eyes six (6) times a day., Disp: 10 mL, Rfl: 0    predniSONE  (DELTASONE )  50 MG tablet, Take 1 tablet (50 mg total) by mouth daily., Disp: 5 tablet, Rfl: 0    tamsulosin  (FLOMAX ) 0.4 mg capsule, Take 1 capsule (0.4 mg total) by mouth daily., Disp: 90 capsule, Rfl: 3    benzonatate  (TESSALON ) 200 MG capsule, Take 1 capsule (200 mg total) by mouth Three (3) times a day as needed., Disp: 30 capsule, Rfl: 0    levoFLOXacin  (LEVAQUIN ) 500 MG tablet, Take 1 tablet (500 mg total) by mouth daily for 10 days., Disp: 10 tablet, Rfl: 0    promethazine -dextromethorphan (PROMETHAZINE -DM) 6.25-15 mg/5 mL syrup, Take 5 mL by mouth four (4) times a day as needed., Disp: 180 mL, Rfl: 0  [4]   Allergies  Allergen Reactions    Bicalutamide  Rash    Penicillins Rash    Rocephin [Ceftriaxone] Rash     Patient received in Emergency Room and reported rash to stomach and back several hours later.    [5]   Social History  Tobacco Use    Smoking status: Every Day     Current packs/day: 1.00     Average packs/day: 1 pack/day for 55.9 years (55.9 ttl pk-yrs)     Types: Cigarettes     Start date: 06/16/1969     Passive exposure: Never    Smokeless tobacco: Never   Vaping Use    Vaping status: Never Used   Substance Use Topics    Alcohol use: No    Drug use: Not Currently     Types: Cocaine, IV, Marijuana     Comment: History of cocaine use years ago (intranasal, IV and crack), marijuana use.    [6]   Family History  Problem Relation Age of Onset    Lung cancer Mother     Bone cancer Mother     Hypertension Mother     COPD Mother     Cancer Mother         lung cancer    Heart attack Father     Hypertension Father     Heart disease Father     Hepatitis Sister     Melanoma Maternal Aunt     Heart disease Maternal Grandfather     Liver disease Brother         2022    Diabetes Paternal Aunt Cancer Maternal Aunt         lung cancer    Liver disease Brother         2022    Cancer Maternal Aunt         lung cancer

## 2024-07-13 NOTE — Patient Instructions (Signed)
 Rest and drink plenty of fluids.  Tylenol /ibuprofen as needed for fever, headache and body aches.  Other over-the-counter cough medications like Robitussin DM/Mucinex  DM and sore throat medications are fine as well.    Take the Levofloxacin  with food.  The Promethazine  DM syrup will cause drowsiness.    Follow-up with your PCP if your symptoms are not improving.    Go to the emergency department for increasing shortness of breath, severe chest or abdominal pain, rapid heartbeat. severe headache, persistent high fever or vomiting or for any other worsening symptoms.

## 2024-07-16 MED ORDER — BUPRENORPHINE 8 MG-NALOXONE 2 MG SUBLINGUAL FILM
ORAL_FILM | Freq: Two times a day (BID) | SUBLINGUAL | 0 refills | 30.00000 days | Status: CP
Start: 2024-07-16 — End: 2024-08-15

## 2024-07-20 DIAGNOSIS — F1111 Opioid abuse, in remission: Principal | ICD-10-CM

## 2024-07-20 MED ORDER — BUPRENORPHINE 8 MG-NALOXONE 2 MG SUBLINGUAL FILM
ORAL_FILM | 0.00000 days
Start: 2024-07-20 — End: ?

## 2024-07-22 MED ORDER — BUPRENORPHINE 8 MG-NALOXONE 2 MG SUBLINGUAL FILM
ORAL_FILM | 0.00000 days
Start: 2024-07-22 — End: ?

## 2024-07-22 NOTE — Telephone Encounter (Signed)
 Has prescriptions from 11/9 and 07/16/2024 that have not been filled per PDMP.  Messaged patient about this

## 2024-07-24 DIAGNOSIS — F1111 Opioid abuse, in remission: Principal | ICD-10-CM

## 2024-07-24 MED ORDER — BUPRENORPHINE 8 MG-NALOXONE 2 MG SUBLINGUAL FILM
ORAL_FILM | Freq: Two times a day (BID) | SUBLINGUAL | 0 refills | 30.00000 days | Status: CP
Start: 2024-07-24 — End: 2024-08-23

## 2024-07-24 NOTE — Telephone Encounter (Signed)
 The suboxone  script is still not at the pharmacy

## 2024-07-31 ENCOUNTER — Encounter: Admit: 2024-07-31 | Discharge: 2024-07-31 | Payer: MEDICARE | Attending: Anesthesiology | Primary: Anesthesiology

## 2024-07-31 ENCOUNTER — Inpatient Hospital Stay: Admit: 2024-07-31 | Discharge: 2024-07-31 | Payer: MEDICARE

## 2024-07-31 MED ADMIN — albuterol (PROVENTIL HFA;VENTOLIN HFA) 90 mcg/actuation inhaler: RESPIRATORY_TRACT | @ 13:00:00 | Stop: 2024-07-31

## 2024-07-31 MED ADMIN — lidocaine (PF) (XYLOCAINE-MPF) 20 mg/mL (2 %) injection: INTRAVENOUS | @ 13:00:00 | Stop: 2024-07-31

## 2024-07-31 MED ADMIN — Propofol (DIPRIVAN) injection: INTRAVENOUS | @ 13:00:00 | Stop: 2024-07-31

## 2024-08-05 ENCOUNTER — Ambulatory Visit: Admit: 2024-08-05 | Discharge: 2024-08-06 | Payer: MEDICARE

## 2024-08-05 NOTE — Telephone Encounter (Signed)
 path

## 2024-08-05 NOTE — Progress Notes (Signed)
 [facility]  Adult Audiology     [facility] Adult Audiology Hearing Aid Follow-up Appointment      PATIENT: Harold Richards, Harold Richards  DOB: [birth year]  MRN: [id number]  DOS: 08/05/2024    HISTORY      [name] is a 68 y.o. individual with a known hearing loss seen today for hearing aid follow-up. Patient was unaccompanied to today's appointment.  Today, patient reports satisfaction with hearing aids and app. Denies concerns or changes in hearing.    Most recent hearing test was on 09/19/2023.     DEVICE INFORMATION        Right Ear Left Ear   Manufacturer [employer]   Model Pure C&G 3 IX    Serial Number [id number]   HAF Date 12/20/2023   Warranty 12/21/2026   Receiver strength and size 83M 83M           Dome small VENTED small VENTED           Bluetooth N/A   Battery Rechargeable   Accessory(s)     Accessory Warranty     Charger Serial Number [id number]   Charger Warranty 12/21/2026   Loss and Damage Claim  available available      HEARING AID FOLLOW UP      Otoscopy  RIGHT Ear: clear external auditory canal  LEFT Ear: clear external auditory canal    Placed hearing aids in dehumidifier and small parts and pieces were cleaned and/or replaced. A listening check was completed and the hearing aids were found to be in good working order, without evidence of static, weakness, or distortion.     Physical Fit:  Hearing aid(s) continue to fit snugly in patient's ear comfortably and appropriately.     Programming:  Datalogging: 13 hours/day on average    Feedback management: Completed at previous visit     Verification: NAL-NL2  The hearing aids were previously verified to best match targets on 12/20/2023 via real-ear measures.      Other programming changes: firmware updated     The hearing aids were programmed with the following settings:  Start-up Program:   P1: Universal  P2: Noisy Environment  P3: TV  P4: Music  P5: Outdoor sport     User Settings:  Toggle Function: left=volume, right= program change  (new)  Long press bottom button = off/on     BLUETOOTH CONNECTIVITY & SMART PHONE APP      Hearing aid(s) remain paired with smartphone Bluetooth. App remains paired. Discussed Sound Balance feature.     COUNSELING     The following concepts regarding hearing aid management/maintenance were discussed:  [x]  Notified of walk-in clinic hours ([place] location: Mondays and Fridays at 1pm, [place] our [place] location: Wednesdays from 8-11am)  [x]  Provided extra supplies.      [name] was encouraged to contact the Adult Hearing Aid Program at [phone] for device troubleshooting questions or supplies as needed.      Patient was counseled on today's results via verbal communication and expressed understanding.  No barriers to education were identified.    RECOMMENDATIONS     ?? Full time use of amplification.   ?? Contact clinic if hearing aid(s) performance changes.  ?? Return to clinic in approximately 3 months for hearing evaluation and hearing aid follow-up.      Charges associated with today's visit:  HC No Charge; Qty: 2    Visit Time: 30 min    Tinnie LOISE Kelp, AUD,  CCC-A  [place] [facility] Adult Audiology Program  Scheduling: [phone]

## 2024-08-13 ENCOUNTER — Encounter: Admit: 2024-08-13 | Discharge: 2024-08-13 | Payer: MEDICARE

## 2024-08-13 DIAGNOSIS — C61 Malignant neoplasm of prostate: Principal | ICD-10-CM

## 2024-08-13 DIAGNOSIS — K224 Dyskinesia of esophagus: Principal | ICD-10-CM

## 2024-08-13 DIAGNOSIS — K7469 Other cirrhosis of liver: Principal | ICD-10-CM

## 2024-08-13 DIAGNOSIS — F1111 Opioid abuse, in remission: Principal | ICD-10-CM

## 2024-08-13 DIAGNOSIS — K219 Gastro-esophageal reflux disease without esophagitis: Principal | ICD-10-CM

## 2024-08-13 DIAGNOSIS — J438 Other emphysema: Principal | ICD-10-CM

## 2024-08-13 DIAGNOSIS — R110 Nausea: Principal | ICD-10-CM

## 2024-08-13 LAB — TOXICOLOGY SCREEN, URINE
AMPHETAMINE SCREEN URINE: NEGATIVE
BARBITURATE SCREEN URINE: NEGATIVE
BENZODIAZEPINE SCREEN, URINE: NEGATIVE
BUPRENORPHINE, URINE SCREEN: POSITIVE — AB
CANNABINOID SCREEN URINE: NEGATIVE
COCAINE(METAB.)SCREEN, URINE: NEGATIVE
METHADONE SCREEN, URINE: NEGATIVE
OPIATE SCREEN URINE: NEGATIVE
OXYCODONE SCREEN URINE: NEGATIVE

## 2024-08-13 MED ORDER — TAMSULOSIN 0.4 MG CAPSULE
ORAL_CAPSULE | Freq: Every day | ORAL | 0 refills | 90.00000 days | Status: CP
Start: 2024-08-13 — End: 2024-11-11

## 2024-08-13 MED ORDER — PANTOPRAZOLE 40 MG TABLET,DELAYED RELEASE
ORAL_TABLET | Freq: Every evening | ORAL | 3 refills | 90.00000 days | Status: CP
Start: 2024-08-13 — End: ?

## 2024-08-13 NOTE — Progress Notes (Signed)
 [facility]     Reason for visit: Follow up    Questions / Concerns that need to be addressed:     Screening BP- 133/60      PTHomeBP       Dexcom or Libre CGM in use? If so, pull appropriate reporting through portal (Dexcom) or EPIC order [name]).    HCDM reviewed and updated in Epic:    We are working to make sure all of our patients??? wishes are updated in Epic and part of that is documenting a Environmental Health Practitioner for each patient  A Health Care Decision Maker is someone you choose who can make health care decisions for you if you are not able - who would you most want to do this for you????  is already up to date.    HCDM (patient stated preference): Lasaro, Primm - Spouse - [phone]    HCDM, First Alternate: [name] - Daughter - [phone]    HCDM, Second AlternateBETHA Rudean Norris - Daughter - [phone]    BPAs completed:      Annual Screenings:     __________________________________________________________________________________________    SCREENINGS COMPLETED IN FLOWSHEETS      AUDIT       PHQ2       PHQ9          GAD7       COPD Assessment       Falls Risk

## 2024-08-13 NOTE — Progress Notes (Signed)
 [facility] - Faculty Practice  Internal Medicine Clinic Visit    Reason for visit: MOUD treatment, esophageal issues, hx of osteopenia    A/P:    Assessment & Plan  Esophageal dysmotility with gastroesophageal reflux  Esophageal dysmotility with reflux confirmed by swallow study.Unremarkable EGD on 07/31/24, but continues to have some esophageal dysphagia.  - Continue Protonix  once daily given provoked reflux on swallow study  - keep appt with GI in march  - Sent message to GI specialist to discuss potential esophageal motility study/manometry before March appointment.    GERD without esophagitis  - continue protonix  daily    Cirrhosis  From prior alcohol abuse.  Hx of hepatic encephalopathy.  Recent EGD with no varices.   Continue lactulose  10g BID  - MRI liver scheduled 10/07/2024    Bilateral nephrolithiasis  Small stones in both kidneys. No current urinary symptoms related to nephrolithiasis.  - Encouraged fluid intake of 2-3 liters per day.  - Scheduled ultrasound for kidney stones in May.    Benign prostatic hyperplasia with lower urinary tract symptoms  Benign prostatic hyperplasia with nocturia 3-4 times per night. Currently on Flomax  0.4mg  nightly  - Increased Flomax  to two tablets at night.  - Sent prescription for additional Flomax  to [facility].    Osteoporosis  Currently on Fosamax  since May. Concerns about esophageal irritation due to swallowing issues. Potential switch to annual infusion therapy discussed.  - Sent message to Dr. Melodye to discuss switching from Fosamax  to annual infusion therapy.    Other emphysema  - recent CAP treated with levofloxacin  and prednisone .  - linger mild cough  - otherwise asymptomatic from emphysema and not currently prescribed antibiotics    High risk Prostate cancer (dx 2020)  S/p radiation treatment and ADT (completed 11/2019)  - continue annual PSA, last < 0.04  - continue to follow with Rad Onc q12 months    Opioid use disorder, mild, in sustained remission  Patient IMCMATSTABILITY: is stable on maintenance therapy [email or web address]: 2 month follow up, next visit virtual    PRESCRIPTIONS GIVEN TODAY:    Medications ordered during this encounter   Medications    buprenorphine -naloxone  (SUBOXONE ) 8-2 mg sublingual film     Sig: Place 1 Film (8 mg of buprenorphine  total) under the tongue every twelve (12) hours.     Dispense:  60 Film     Refill:  0    buprenorphine -naloxone  (SUBOXONE ) 8-2 mg sublingual film     Sig: Place 1 Film (8 mg of buprenorphine  total) under the tongue two (2) times a day.     Dispense:  60 Film     Refill:  0    pantoprazole  (PROTONIX ) 40 MG tablet     Sig: Take 1 tablet (40 mg total) by mouth nightly.     Dispense:  90 tablet     Refill:  3    tamsulosin  (FLOMAX ) 0.4 mg capsule     Sig: Take 2 capsules (0.8 mg total) by mouth daily.     Dispense:  180 capsule     Refill:  0      - Benefits of treatment outweigh the risks  - Utox today: will be positive for:  - PDMP reviewed: Appropriate on review today  - Next appt: Return in about 2 months (around 10/11/2024) for Video Visit at 8:20am on 10/11/24.  - Substance Abuse Counseling:  completed  - Does pt have naloxone  at home and know how to use it?  yes  - Labs needed today? none    No results found for: HIV12AB, HIVAGAB  Lab Results   Component Value Date    HEPAIGG Nonreactive 07/22/2015    HEPBCAB Reactive (A) 07/22/2015    HEPCAB Positive (A) 03/05/2014     Lab Results   Component Value Date    ALKPHOS 44 (L) 04/09/2024    BILITOT 0.2 (L) 04/09/2024    BILIDIR <0.10 12/23/2020    PROT 6.6 04/09/2024    ALBUMIN 3.5 04/09/2024    ALT 15 04/09/2024    AST 27 04/09/2024     No results found for: PREGTESTUR, PREGSERUM, HCG, HCGQUANT    - Suboxone  treatment agreement:  has been signed by patient and scanned into record        Return in about 2 months (around 10/11/2024) for Video Visit at 8:20am on 10/11/24.      __________________________________________________________    HPI:    History of Present Illness  Harold Richards is a 69 year old male with esophageal dysmotility and kidney stones who presents with ongoing swallowing issues and urinary frequency.    Dysphagia and esophageal symptoms  - Swallowing difficulties ongoing for approximately one year  - Pain with swallowing primarily triggered by hot foods; no issues with cold foods or drinks  - Symptoms persisted for about two weeks following an esophageal scope performed a few weeks before Christmas  - Abnormal swallow study results  - History of esophageal dysmotility and reflux  - Takes Protonix  once daily for reflux  - Swallowing issues began around the time Fosamax  was started in May    Nephrolithiasis and lower urinary tract symptoms  - Kidney stones present in both kidneys  - Chronic urinary frequency, waking three to four times nightly to urinate  - Takes Flomax  nightly  - Drinks three to four bottles of water  daily      Gastrointestinal symptoms and medication use  - Occasional nausea, uses Zofran  as needed, with decreased frequency recently    Osteoporosis  - Takes Fosamax  weekly for bone health     __________________________________________________________        Medications:  Reviewed in EPIC  __________________________________________________________    Physical Exam:   Vital Signs:  Vitals:    08/13/24 0806   BP: 133/60   BP Position: Sitting   Pulse: 72   Resp: 16   Temp: 35.7 ??C (96.3 ??F)   TempSrc: Temporal   SpO2: 97%   Weight: 49.9 kg (110 lb)   Height: 167.4 cm (5' 5.9)          PTHomeBP  The patient???s Average Home Blood Pressure during the last two weeks is :   /   based on  readings    Wt Readings from Last 6 Encounters:   08/13/24 49.9 kg (110 lb)   07/31/24 49 kg (108 lb)   07/13/24 49 kg (108 lb)   07/02/24 51.7 kg (114 lb)   06/27/24 51 kg (112 lb 6.4 oz)   06/23/24 52.2 kg (115 lb)                 Physical Exam      Gen: Thin, Well appearing, NAD    PHQ-9 Score:     GAD-7 Score:       Medication adherence and barriers to the treatment plan have been addressed. Opportunities to optimize healthy behaviors have been discussed. Patient / caregiver voiced understanding.    Penicillin Allergy Assessment  Pt is a 69 y.o. YO male with listed allergy to a penicillin class antibiotic.      PATIENT REPORTED ALLERGY HISTORY:  Medication associated with reaction: penicillin  Route of administration: oral  Indication for antibiotic: CAP, possible COPD exacerbation  Description of Reaction:   How long ago was the reaction? > 10 yrs  Symptoms experienced:   Intolerances: N/A   Low-risk: N/A  Moderate-high risk: rash (immediate onset)  Severe non IgE-mediated: N/A  Other: N/A  Timing/onset till symptoms: Intermediate (4 - 24 hrs)   Treatment: none    PEN-FAST score ([email or web address]) =     Have you taken and tolerated any penicillin-class antibiotics since this reaction? Yes, patient has taken augmentin 07/2023    Documentation of patient receiving penicillin-class antibiotic in the MEDICAL RECORD NUMBER  Tolerated outpatient (medication: augmentin, date 12/2/204)     Documentation of patient receiving a cephalosporin in the MEDICAL RECORD NUMBER  Tolerated inpatient (medication: cefdinir, date 04/07/2019)     Documentation of patient receiving a carbapenem in the MEDICAL RECORD NUMBER  None      ASSESSMENT/PLAN:   Based on allergy and medication history, would classify patient's penicillin allergy as inaccurately labeled and would recommend de-labeling penicillin allergy .   Removed penicillin allergy from allergy list.       FINANCIAL INFORMATION FOR ALLERGY PROCEDURES:   Patient can discuss with clinic financial counselor if available or call the [facility] Estimates Team at [phone], Monday-Friday, 8:30 a.m. - 4:30 p.m. (except major holidays) and give them the following information for cost estimate:    Visit:   - Level 3 Consult   CPT Codes for Allergy Procedures:   - Penicillin Skin testing: 04891 with #9 units   - Amoxicillin oral challenge: 95076 with #1 unit

## 2024-08-13 NOTE — Patient Instructions (Addendum)
 I will talk with Dr. Melodye about switching you to the infusion for osteoporosis called Reclast  that you would get once a year since you have been having esophagus problems. I will likely take over your osteoporosis care from here and you can cancel your appointments with Endocrine in the future.    I will talk with the GI provider you are seeing in march and see if she would like to go ahead with another esophagus study called esophageal manometry before your appointment with her in March.     Continue Protonix  40mg  once a day.     I sent prescriptions for Suboxone  for Jan and Feb.

## 2024-08-23 MED ORDER — BUPRENORPHINE 8 MG-NALOXONE 2 MG SUBLINGUAL FILM
ORAL_FILM | Freq: Two times a day (BID) | SUBLINGUAL | 0 refills | 30.00000 days | Status: CP
Start: 2024-08-23 — End: 2024-09-22

## 2024-08-26 ENCOUNTER — Emergency Department
Admit: 2024-08-26 | Discharge: 2024-08-26 | Disposition: A | Discharge status: Home / Self Care | Payer: BLUE CROSS/BLUE SHIELD | Attending: Student in an Organized Health Care Education/Training Program

## 2024-08-26 ENCOUNTER — Emergency Department
Admit: 2024-08-26 | Discharge: 2024-08-26 | Disposition: A | Discharge status: Home / Self Care | Payer: MEDICARE | Attending: Student in an Organized Health Care Education/Training Program

## 2024-08-26 DIAGNOSIS — M25511 Pain in right shoulder: Principal | ICD-10-CM

## 2024-08-26 DIAGNOSIS — W19XXXA Unspecified fall, initial encounter: Principal | ICD-10-CM

## 2024-08-26 MED ORDER — LIDOCAINE 5 % TOPICAL PATCH
MEDICATED_PATCH | TRANSDERMAL | 0 refills | 30.00000 days | Status: CP
Start: 2024-08-26 — End: 2024-09-25

## 2024-08-26 MED ORDER — OXYCODONE 5 MG TABLET
ORAL_TABLET | Freq: Four times a day (QID) | ORAL | 0 refills | 3.00000 days | Status: CP | PRN
Start: 2024-08-26 — End: 2024-08-31

## 2024-08-26 MED ADMIN — acetaminophen (TYLENOL) tablet 1,000 mg: 1000 mg | ORAL | @ 13:00:00 | Stop: 2024-08-26

## 2024-08-26 MED ADMIN — oxyCODONE (ROXICODONE) immediate release tablet 5 mg: 5 mg | ORAL | @ 14:00:00 | Stop: 2024-08-26

## 2024-08-26 MED ADMIN — ibuprofen (MOTRIN) tablet 600 mg: 600 mg | ORAL | @ 13:00:00 | Stop: 2024-08-26

## 2024-08-26 MED ADMIN — oxyCODONE (ROXICODONE) immediate release tablet 5 mg: 5 mg | ORAL | @ 13:00:00 | Stop: 2024-08-26

## 2024-08-26 MED ADMIN — lidocaine (ASPERCREME) 4 % 1 patch: 1 | TRANSDERMAL | @ 13:00:00 | Stop: 2024-08-26

## 2024-08-26 NOTE — ED Triage Note (Signed)
 Pt arrives with c/o falling last night with pain to right shoulder.  Pt states he tripped over a chair.  Pt was able to get up by himself after a few moments.  Pt denies head strike or LOC.

## 2024-08-26 NOTE — ED Provider Notes (Signed)
 [facility]  Emergency Department Provider Note     ED Clinical Impression     Final diagnoses:   Fall, initial encounter (Primary)   Acute pain of right shoulder      HPI, Medical Decision Making, ED Course       History of Present Illness  Harold Richards is a 69 year old male with a past medical history of GERD, cirrhosis, nephrolithiasis, BPH, osteoporosis, emphysema, high-risk prostate cancer status post radiation and androgen deprivation therapy completed 11/2019, opioid use disorder in sustained remission, and tobacco use disorder presents with right shoulder pain after a mechanical fall. The patient reports that last night he tripped over a chair and fell directly onto his right shoulder. He denies head strike or loss of consciousness. He takes aspirin but denies anticoagulation use. Since the fall, he has had persistent right shoulder pain with difficulty lifting and abducting the arm. He denies numbness, tingling, or weakness of the hand and denies pain in the elbow or wrist. He has not taken any medications for pain. He reports a history of prior cervical spine surgeries but denies neck pain today. He denies chest pain, shortness of breath, nausea, vomiting, fevers, or other acute complaints. He is accompanied by his wife.    Medical Decision Making  69 year old male presenting after a mechanical fall with isolated right shoulder pain. On arrival, vital signs are stable. On exam, patient appears well and in no acute distress. He is unable to abduct the right shoulder due to pain. Sensation is intact in the axillary, median, radial, and ulnar nerve distributions. Strength is preserved at the elbow, wrist, and hand. There is no visible deformity, ecchymosis, or swelling. Radial pulse is palpable with good perfusion. There are no findings concerning for acute neurovascular injury.    Given the mechanism of injury and exam findings, the differential includes rotator cuff injury, shoulder dislocation, occult fracture, acromioclavicular injury, and contusion. Shoulder dislocation and fracture are considered given the fall mechanism; however, plain radiographs of the right shoulder are independently reviewed and show no evidence of fracture or dislocation, arguing against these diagnoses. Acromioclavicular separation is less likely in the absence of deformity or focal AC joint tenderness. Rotator cuff injury is supported by the patient???s inability to abduct the shoulder and preserved distal strength and sensation. Bedside POCUS demonstrates findings suspicious for a partial supraspinatus tear, which is consistent with the patient???s symptoms and exam.    Given the reassuring neurovascular exam, absence of osseous injury, and imaging findings suggestive of soft tissue injury, patient is appropriate for conservative management. Plan is for sling for comfort, outpatient orthopedic referral, and encouragement of gentle range of motion exercises throughout the day to prevent stiffness. Patient is advised on pain control options and strict return precautions. Patient is discharged in stable condition.    Diagnostic workup as below. Will treat patient with Tylenol , ibuprofen , lidocaine  patch.    Orders Placed This Encounter   Procedures    XR Shoulder 3 Or More Views Right    Ambulatory referral to Orthopedic Surgery    Sling to:       ED Course  ED Course as of 08/29/24 1915   Mon Aug 26, 2024   9257 XR Shoulder 3 Or More Views Right  No acute fracture or dislocation       I have reviewed the vital signs and the nursing notes. Labs and radiology results that were available during my care of the patient were independently reviewed by me  and considered in my medical decision making.     I independently visualized the EKG tracing if performed  I independently visualized the radiology images if performed  I reviewed the patient's prior medical records if available.  Additional history obtained from family if available  I discussed the case with the admitting provider and the consulting services if the patient was admitted and/or consulting services were utilized.    Additional History Elements     Chief Complaint  Chief Complaint   Patient presents with    Fall     Additional Historian(s): the patient's spouse    Past Medical History[1]    Past Surgical History[2]    Active Medications[3]     Allergies[4]    Family History[5]    Short Social History[6]     Physical Exam     VITAL SIGNS:      Vitals:    08/26/24 0718 08/26/24 0832   BP: 138/62 123/65   Pulse: 73 63   Resp: 20 20   Temp: 36.7 ??C (98.1 ??F)    SpO2: 100%    Weight: 47.6 kg (105 lb)      Constitutional: Alert and oriented to person, place, time, and situation. Well appearing, calm, cooperative, and in no acute distress. Non-toxic appearing.  Eyes: Conjunctivae normal.  HEENT: Normocephalic and atraumatic. No congestion. Mucous membranes moist.  Neck: Supple. No midline cervical spine tenderness.  Cardiovascular: Rate as above, regular rhythm. Bilateral radial pulses palpable, symmetric, and normal in amplitude. Distal extremities warm and well perfused.  Respiratory: Normal respiratory effort. Breath sounds clear bilaterally. No wheezes or crackles.  Gastrointestinal: Abdomen soft, non-distended, non-tender.  Genitourinary: Deferred.  Musculoskeletal: Right shoulder with pain and limited active range of motion, particularly with abduction. No visible deformity, swelling, ecchymosis, or crepitus. No tenderness of the elbow, wrist, or hand.  Neurologic: Normal speech and language. Sensation intact in axillary, median, radial, and ulnar nerve distributions. Strength intact at elbow, wrist, and hand. No focal neurologic deficits appreciated.  Skin: Warm, dry, and intact. No rash or ecchymosis noted.  Psychiatric: Mood and affect appropriate. Speech and behavior normal.      Radiology     XR Shoulder 3 Or More Views Right   Final Result   No acute fracture or dislocation. Portions of this record have been created using Scientist, clinical (histocompatibility and immunogenetics). Dictation errors have been sought, but may not have been identified and corrected.           [1]   Past Medical History:  Diagnosis Date    Actinic keratosis     Allergic     Allergic rhinitis 2018    Anemia 05/30/2023    on iron supplement but still has not resolved    Asthma (HHS-HCC)     Basal cell carcinoma     Chronic headaches     Colon polyp     Coronary artery disease 2018    Dental disease 2005    dentures    Elevated PSA 2019    Enlarged prostate     GERD (gastroesophageal reflux disease) 02/08/2013    Hepatitis C     HL (hearing loss) 2023    Hx of substance abuse (CMS-HCC)     hx of recreational drug use (cocaine IV, intranasal and crack),     Hyperlipidemia 2018    Infectious viral hepatitis     Lung nodule seen on imaging study 07/12/2016    Osteoporosis     Other emphysema (  CMS-HCC)     Prostate cancer    (CMS-HCC) October 2019    Renal calculus     Skin cancer     Tinnitus 2018    Tobacco abuse 02/08/2013   [2]   Past Surgical History:  Procedure Laterality Date    CERVICAL FUSION      cage    COLONOSCOPY  2014    COSMETIC SURGERY  2021    MOHS    ORTHOPEDIC SURGERY  2005    neck    PR COLONOSCOPY FLX DX W/COLLJ SPEC WHEN PFRMD N/A 09/01/2021    Procedure: COLONOSCOPY, FLEXIBLE, PROXIMAL TO SPLENIC FLEXURE; DIAGNOSTIC, W/WO COLLECTION SPECIMEN BY BRUSH OR WASH;  Surgeon: Alphonsa Lav, MD;  Location: [facility] [place];  Service: Gastroenterology    PR COLSC FLX W/RMVL OF TUMOR POLYP LESION SNARE TQ N/A 10/20/2015    Procedure: COLONOSCOPY FLEX; W/REMOV TUMOR/LES BY SNARE;  Surgeon: Sandrea Rollene Mood, MD;  [facility];  Service: Gastroenterology    PR EXCISION SUBMAXILLARY GLAND Right 03/31/2015    Procedure: EXC SUBMANDIBULAR GLAND;  Surgeon: Frederic Marcey Lou, MD;  Location: ASC OR [facility];  Service: ENT    PR REMV UPPER JAW-MAXILLECTOMY Left 07/22/2019    Procedure: MAXILLECTOMY; WO ORBITAL EXENTERATION;  Surgeon: Adam Jordan Kimple, MD;  Location: ASC [facility];  Service: ENT    PR STEREOTACTIC COMP ASSIST PROC,CRANIAL,EXTRADURAL Left 07/22/2019    Procedure: STEREOTACTIC COMPUTER-ASSISTED (NAVIGATIONAL) PROCEDURE; CRANIAL, EXTRADURAL;  Surgeon: Adam Jordan Kimple, MD;  Location: [facility] OR [facility];  Service: ENT    PR UPPER GI ENDOSCOPY,BIOPSY N/A 07/31/2024    Procedure: UGI ENDOSCOPY; WITH BIOPSY, SINGLE OR MULTIPLE;  Surgeon: Conny Dorn Agent, MD;  Location: [facility];  Service: Gastroenterology    PR UPPER GI ENDOSCOPY,DIAGNOSIS N/A 08/18/2017    Procedure: UGI ENDO, INCLUDE ESOPHAGUS, STOMACH, & DUODENUM &/OR JEJUNUM; DX W/WO COLLECTION SPECIMN, BY BRUSH OR WASH;  Surgeon: Luke Cory Notch, MD;  [facility];  Service: Gastroenterology    PR UPPER GI ENDOSCOPY,DIAGNOSIS N/A 11/08/2019    Procedure: UGI ENDO, INCLUDE ESOPHAGUS, STOMACH, & DUODENUM &/OR JEJUNUM; DX W/WO COLLECTION SPECIMN, BY BRUSH OR WASH;  Surgeon: Sim Donna Magnuson, MD;  [facility]: [facility];  Service: Gastroenterology    PROSTATE SURGERY  2020    SKIN BIOPSY      SPINE SURGERY  2009    UPPER GASTROINTESTINAL ENDOSCOPY  2019    VASECTOMY  1988   [3]   No current facility-administered medications for this encounter.     Current Outpatient Medications   Medication Sig Dispense Refill    albuterol  HFA 90 mcg/actuation inhaler Inhale 2 puffs every four (4) hours as needed for wheezing. 8 g 3    alendronate  (FOSAMAX ) 70 MG tablet Take 1 tablet (70 mg total) by mouth every seven (7) days. 13 tablet 3    aspirin (ECOTRIN) 81 MG tablet Take 1 tablet (81 mg total) by mouth daily.      atorvastatin  (LIPITOR) 20 MG tablet TAKE 1 TABLET(20 MG) BY MOUTH EVERY NIGHT 100 tablet 3    buprenorphine -naloxone  (SUBOXONE ) 8-2 mg sublingual film Place 1 Film (8 mg of buprenorphine  total) under the tongue every twelve (12) hours. 60 Film 0    [START ON 09/22/2024] buprenorphine -naloxone  (SUBOXONE ) 8-2 mg sublingual film Place 1 Film (8 mg of buprenorphine  total) under the tongue two (2) times a day. 60 Film 0    cholecalciferol, vitamin  D3-25 mcg, 1,000 unit,, 25 mcg (1,000 unit) capsule Take 1 capsule (25 mcg total) by mouth daily.      lactulose  10 gram/15 mL solution Take 15 mL (10 g total) by mouth two (2) times a day. 900 mL 6    lidocaine  (LIDODERM ) 5 % patch Place 1 patch on the skin daily. Apply to affected area for 12 hours only each day (then remove patch) 30 patch 0    loratadine (CLARITIN) 10 mg tablet Take 1 tablet (10 mg total) by mouth once as needed.      naloxone  (NARCAN ) 4 mg nasal spray One spray in either nostril once for known/suspected opioid overdose. May repeat every 2-3 minutes in alternating nostril til EMS arrives (Patient not taking: Reported on 08/13/2024) 2 each PRN    oxyCODONE  (ROXICODONE ) 5 MG immediate release tablet Take 1 tablet (5 mg total) by mouth every six (6) hours as needed for pain for up to 5 days. DO NOT DRIVE after taking 10 tablet 0    pantoprazole  (PROTONIX ) 40 MG tablet Take 1 tablet (40 mg total) by mouth nightly. 90 tablet 3    polymyxin B  sulf-trimethoprim  (POLYTRIM ) 10,000 unit- 1 mg/mL Drop Administer 1 drop to both eyes six (6) times a day. (Patient not taking: Reported on 08/13/2024) 10 mL 0    promethazine -dextromethorphan (PROMETHAZINE -DM) 6.25-15 mg/5 mL syrup Take 5 mL by mouth four (4) times a day as needed. 180 mL 0    tamsulosin  (FLOMAX ) 0.4 mg capsule Take 2 capsules (0.8 mg total) by mouth daily. 180 capsule 0   [4]   Allergies  Allergen Reactions    Bicalutamide  Rash    Rocephin [Ceftriaxone] Rash     Patient received in Emergency Room and reported rash to stomach and back several hours later.   Later received cefdinir with no issue   [5]   Family History  Problem Relation Age of Onset    Lung cancer Mother     Bone cancer Mother     Hypertension Mother     COPD Mother     Cancer Mother         lung cancer    Heart attack Father     Hypertension Father     Heart disease Father     Hepatitis Sister     Melanoma Maternal Aunt     Heart disease Maternal Grandfather     Liver disease Brother         2022    Diabetes Paternal Aunt     Cancer Maternal Aunt         lung cancer    Liver disease Brother         2022    Cancer Maternal Aunt         lung cancer   [6]   Social History  Tobacco Use    Smoking status: Every Day     Current packs/day: 1.00     Average packs/day: 1 pack/day for 56.0 years (56.0 ttl pk-yrs)     Types: Cigarettes     Start date: 06/16/1969     Passive exposure: Never    Smokeless tobacco: Never   Vaping Use    Vaping status: Never Used   Substance Use Topics    Alcohol use: No    Drug use: Not Currently     Types: Cocaine, IV, Marijuana     Comment: History of cocaine use years ago (intranasal, IV and crack), marijuana use.  Jama Alm ORN, DO  Resident  08/29/24 [phone]

## 2024-09-22 MED ORDER — BUPRENORPHINE 8 MG-NALOXONE 2 MG SUBLINGUAL FILM
ORAL_FILM | Freq: Two times a day (BID) | SUBLINGUAL | 0 refills | 30.00000 days | Status: CP
Start: 2024-09-22 — End: 2024-10-22
